# Patient Record
Sex: Female | Born: 1945 | Race: Asian | Hispanic: No | Marital: Married | Smoking: Former smoker
Health system: Southern US, Community
[De-identification: ages and names within clinical notes are randomized; demographics above are authoritative.]

## PROBLEM LIST (undated history)

## (undated) DIAGNOSIS — I429 Cardiomyopathy, unspecified: Secondary | ICD-10-CM

## (undated) DIAGNOSIS — I509 Heart failure, unspecified: Secondary | ICD-10-CM

## (undated) DIAGNOSIS — Z95820 Peripheral vascular angioplasty status with implants and grafts: Secondary | ICD-10-CM

## (undated) DIAGNOSIS — IMO0002 Reserved for concepts with insufficient information to code with codable children: Secondary | ICD-10-CM

## (undated) DIAGNOSIS — D649 Anemia, unspecified: Secondary | ICD-10-CM

## (undated) DIAGNOSIS — C801 Malignant (primary) neoplasm, unspecified: Secondary | ICD-10-CM

## (undated) DIAGNOSIS — Z972 Presence of dental prosthetic device (complete) (partial): Secondary | ICD-10-CM

## (undated) DIAGNOSIS — M329 Systemic lupus erythematosus, unspecified: Secondary | ICD-10-CM

## (undated) DIAGNOSIS — M1990 Unspecified osteoarthritis, unspecified site: Secondary | ICD-10-CM

## (undated) DIAGNOSIS — I739 Peripheral vascular disease, unspecified: Secondary | ICD-10-CM

## (undated) DIAGNOSIS — E785 Hyperlipidemia, unspecified: Secondary | ICD-10-CM

## (undated) DIAGNOSIS — J449 Chronic obstructive pulmonary disease, unspecified: Secondary | ICD-10-CM

## (undated) DIAGNOSIS — M3214 Glomerular disease in systemic lupus erythematosus: Secondary | ICD-10-CM

## (undated) DIAGNOSIS — I10 Essential (primary) hypertension: Secondary | ICD-10-CM

## (undated) DIAGNOSIS — K219 Gastro-esophageal reflux disease without esophagitis: Secondary | ICD-10-CM

## (undated) DIAGNOSIS — N189 Chronic kidney disease, unspecified: Secondary | ICD-10-CM

## (undated) DIAGNOSIS — I7300 Raynaud's syndrome without gangrene: Secondary | ICD-10-CM

## (undated) DIAGNOSIS — M5030 Other cervical disc degeneration, unspecified cervical region: Secondary | ICD-10-CM

## (undated) HISTORY — PX: EYE SURGERY: SHX253

## (undated) HISTORY — DX: Hyperlipidemia, unspecified: E78.5

## (undated) HISTORY — PX: ABDOMINAL HYSTERECTOMY: SHX81

## (undated) HISTORY — PX: CARDIAC SURGERY: SHX584

## (undated) HISTORY — PX: CARDIAC CATHETERIZATION: SHX172

## (undated) HISTORY — DX: Chronic kidney disease, unspecified: N18.9

## (undated) HISTORY — DX: Malignant (primary) neoplasm, unspecified: C80.1

---

## 2001-07-19 HISTORY — PX: CORONARY ARTERY BYPASS GRAFT: SHX141

## 2001-10-25 ENCOUNTER — Ambulatory Visit (HOSPITAL_COMMUNITY): Admission: RE | Admit: 2001-10-25 | Discharge: 2001-10-25 | Payer: Self-pay | Admitting: *Deleted

## 2005-07-13 ENCOUNTER — Ambulatory Visit: Payer: Self-pay | Admitting: Rheumatology

## 2010-11-13 ENCOUNTER — Ambulatory Visit: Payer: Self-pay | Admitting: Vascular Surgery

## 2011-01-15 ENCOUNTER — Ambulatory Visit: Payer: Self-pay | Admitting: Vascular Surgery

## 2011-01-27 ENCOUNTER — Inpatient Hospital Stay: Payer: Self-pay | Admitting: Vascular Surgery

## 2011-12-15 ENCOUNTER — Ambulatory Visit: Payer: Self-pay | Admitting: Vascular Surgery

## 2011-12-15 LAB — CREATININE, SERUM
Creatinine: 1.05 mg/dL (ref 0.60–1.30)
EGFR (African American): >60
EGFR (Non-African Amer.): 56 — ABNORMAL LOW

## 2012-01-04 ENCOUNTER — Ambulatory Visit: Payer: Self-pay | Admitting: Rheumatology

## 2012-04-06 ENCOUNTER — Ambulatory Visit: Payer: Self-pay | Admitting: Internal Medicine

## 2012-04-17 ENCOUNTER — Ambulatory Visit: Payer: Self-pay | Admitting: Internal Medicine

## 2013-03-06 DIAGNOSIS — I7300 Raynaud's syndrome without gangrene: Secondary | ICD-10-CM | POA: Insufficient documentation

## 2013-03-06 DIAGNOSIS — I2510 Atherosclerotic heart disease of native coronary artery without angina pectoris: Secondary | ICD-10-CM | POA: Insufficient documentation

## 2013-03-06 DIAGNOSIS — M329 Systemic lupus erythematosus, unspecified: Secondary | ICD-10-CM | POA: Diagnosis present

## 2013-03-06 DIAGNOSIS — K219 Gastro-esophageal reflux disease without esophagitis: Secondary | ICD-10-CM | POA: Diagnosis present

## 2013-03-07 DIAGNOSIS — N281 Cyst of kidney, acquired: Secondary | ICD-10-CM | POA: Insufficient documentation

## 2013-03-07 DIAGNOSIS — R809 Proteinuria, unspecified: Secondary | ICD-10-CM | POA: Insufficient documentation

## 2013-03-07 DIAGNOSIS — N184 Chronic kidney disease, stage 4 (severe): Secondary | ICD-10-CM | POA: Insufficient documentation

## 2013-03-07 DIAGNOSIS — N182 Chronic kidney disease, stage 2 (mild): Secondary | ICD-10-CM | POA: Insufficient documentation

## 2013-03-07 DIAGNOSIS — J309 Allergic rhinitis, unspecified: Secondary | ICD-10-CM | POA: Insufficient documentation

## 2013-04-25 DIAGNOSIS — M810 Age-related osteoporosis without current pathological fracture: Secondary | ICD-10-CM | POA: Insufficient documentation

## 2013-04-25 DIAGNOSIS — I739 Peripheral vascular disease, unspecified: Secondary | ICD-10-CM | POA: Insufficient documentation

## 2013-06-04 DIAGNOSIS — M3214 Glomerular disease in systemic lupus erythematosus: Secondary | ICD-10-CM | POA: Insufficient documentation

## 2013-07-09 DIAGNOSIS — I10 Essential (primary) hypertension: Secondary | ICD-10-CM | POA: Diagnosis present

## 2013-08-14 ENCOUNTER — Ambulatory Visit: Payer: Self-pay | Admitting: Internal Medicine

## 2014-01-28 DIAGNOSIS — M810 Age-related osteoporosis without current pathological fracture: Secondary | ICD-10-CM | POA: Insufficient documentation

## 2014-01-28 DIAGNOSIS — E785 Hyperlipidemia, unspecified: Secondary | ICD-10-CM | POA: Insufficient documentation

## 2014-02-28 ENCOUNTER — Ambulatory Visit (INDEPENDENT_AMBULATORY_CARE_PROVIDER_SITE_OTHER): Payer: Medicare Other | Admitting: Podiatrist

## 2014-02-28 VITALS — BP 161/93 | HR 61 | Resp 16 | Ht 59.0 in | Wt 120.0 lb

## 2014-02-28 DIAGNOSIS — L723 Sebaceous cyst: Secondary | ICD-10-CM

## 2014-02-28 DIAGNOSIS — M799 Soft tissue disorder, unspecified: Secondary | ICD-10-CM

## 2014-02-28 NOTE — Progress Notes (Signed)
Chief Complaint  Patient presents with  . Toe Pain    she wants dr Valentina Lucks to look at her big toe on her rt foot to see what it is. it hurts sometimes     HPI: Patient is 68 y.o. female who presents today for pain on the right great toe. 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 They're painful with pressure. No pus or purulence is expressed or has been noted by the patient or her husband.  Physical Exam  Patient is awake, alert, and oriented x 3.  In no acute distress.  Vascular status is intact with palpable pedal pulses at 2/4 DP and PT bilateral and capillary refill time within normal limits. Neurological sensation is also intact bilaterally via Semmes Weinstein monofilament at 5/5 sites. Light touch, vibratory sensation, Achilles tendon reflex is intact. Dermatological exam reveals well-circumscribed hard nodular and cystic lesions on each end of the original incision.  Musculature intact with dorsiflexion, plantarflexion, inversion, eversion.  Assessment: Cyst/sebaceous cysts right great toe  Plan: Anesthetized the digit with lidocaine and Marcaine mixture under sterile technique. The toe was then prepped with Betadine exsanguinated. The cysts were excised utilizing a #15 blade and curet. The texture of the tissue was soft and cottage cheese like. It was removed in total and sent for pathology. Antibiotic ointment and a dressing was applied. She will see Dr. Jacqualyn Posey in one week for followup. She was dispensed instructions for antibacterial soap cleansing and use of Neosporin and a bandage. If any problems, concerns, any redness, swelling, pus or pirulence arise she is instructed to call immediately.

## 2014-02-28 NOTE — Patient Instructions (Signed)

## 2014-03-07 ENCOUNTER — Ambulatory Visit (INDEPENDENT_AMBULATORY_CARE_PROVIDER_SITE_OTHER): Payer: Medicare Other | Admitting: Podiatry

## 2014-03-07 VITALS — BP 128/78 | HR 71 | Resp 16

## 2014-03-07 DIAGNOSIS — M799 Soft tissue disorder, unspecified: Secondary | ICD-10-CM

## 2014-03-07 DIAGNOSIS — L723 Sebaceous cyst: Secondary | ICD-10-CM

## 2014-03-07 NOTE — Patient Instructions (Signed)
Continue with antibiotic soaks. Keep covered with antibiotic ointment and a band-it.  Continue this until healed/no drainage.  Monitor for any signs/symptoms of infection. Call the office immediately if any occur or go directly to the emergency room. Call with any questions/concerns.

## 2014-03-07 NOTE — Progress Notes (Signed)
Patient ID: Judy Torres, female   DOB: 04-Nov-1945, 68 y.o.   MRN: [id number]  68 year old female presents today for followup of removal right hallux possible cysts. She says she is to continue with antibiotic soaks and covering with antibiotic ointment and a Band-Aid. She denies any systemic complaints such as fevers, chills, nausea, vomiting. She has any complaints this time.  Objective: AAO x3, NAD. DP/PT pulses palpable. CRT < 3 sec. protective sensation intact the Semmes Weinstein monofilament. Dermatological exam reveals the 2 areas of cyst excision are filling in with granular tissue and there is no surrounding erythema or other clinical signs of infection.   Assessment: 68 year old female status post cyst, sebaceous cyst right hallux removal  Plan: Continue with aortic soaks until healed and continue with antibiotic ointment and a Band-Aid until healed. If there is drainage can switch Epson salt soaks. Continue to monitor for any signs or symptoms of infection and directed to call or go directly to the emergency room. Followup in 2 weeks or sooner any palms are to arise.

## 2014-03-15 ENCOUNTER — Telehealth: Payer: Self-pay | Admitting: *Deleted

## 2014-03-15 ENCOUNTER — Encounter: Payer: Self-pay | Admitting: Podiatrist

## 2014-03-15 NOTE — Telephone Encounter (Signed)
Called and left message letting pt know specimen was a cyst. If any questions please call back.

## 2014-03-21 ENCOUNTER — Ambulatory Visit (INDEPENDENT_AMBULATORY_CARE_PROVIDER_SITE_OTHER): Payer: Medicare Other | Admitting: Podiatry

## 2014-03-21 VITALS — BP 132/84 | HR 67 | Resp 16

## 2014-03-21 DIAGNOSIS — M79609 Pain in unspecified limb: Secondary | ICD-10-CM

## 2014-03-21 DIAGNOSIS — M799 Soft tissue disorder, unspecified: Secondary | ICD-10-CM

## 2014-03-21 DIAGNOSIS — M79676 Pain in unspecified toe(s): Secondary | ICD-10-CM

## 2014-03-21 DIAGNOSIS — L84 Corns and callosities: Secondary | ICD-10-CM

## 2014-03-21 NOTE — Patient Instructions (Signed)
Continue with Epsom salts soaks until healed. Cover with antibiotic ointment and a Band-Aid. Monitor for any signs/symptoms of infection. Call the office immediately if any occur or go directly to the emergency room. Call with any questions/concerns.

## 2014-03-21 NOTE — Progress Notes (Signed)
Patient ID: Judy Torres, female   DOB: [birth year], 68 y.o.   MRN: [id number]  Subjective: 68 year old female returns the office today for followup evaluation of right hallux status post inclusion cyst removal. States she continues with Epson salt soaks followed by antibiotic ointment and a dressing daily. Denies any systemic complaints such as fevers, chills, nausea, vomiting. Patient has secondary complaints of painful calluses on the distal aspect of the third and fifth digits. Pain increases with pressure in shoe gear. Patient states that she will likely be having since placed in her right leg to help improve blood flow and [facility] soon. No other complaints at this time.  Objective: AAO x3, NAD DP/PT pulses palpable. CRT < 3sec.  Right hallux cyst removal sites healing without complications. Lateral site has completely healed and medial with superficial granular wound. Small amount of hyperkeratotic tissue overlying the periwound. No surrounding erythema, drainage/purulence, fluctuance, crepitus, ascending cellulitis.  Hyperkeratotic lesion distal aspect third digit and lateral fifth digit right foot.  Hammertoe deformities of lesser digits, adductovarus the fifth digit. MMT 5/5, ROM WNL  Assessment:  68 year old female status post right hallux inclusion cyst removal healing without complications, symptomatic hyperkeratotic lesions third and fifth digits.  Plan: -Discussed various treatment options the patient in detail including alternatives, risks, complications. -Hyperkeratotic lesion on hallux medial wound sharply debrided without complications. [place] wound is granular, superficial, and without signs of infection. Continue with  soaks daily followed by antibiotic ointment and a Band-Aid. -Hyperkeratotic lesions third and fifth digits sharply debrided without complications to patient comfort. -Followup in 2 weeks for recheck of the right hallux wound or sooner if any problems are to  arise. Monitor for any clinical signs or symptoms of infection and directed to call the office immediately if any are to occur or go directly to the emergency room. Call the questions or concerns or any change in symptoms.

## 2014-04-04 ENCOUNTER — Ambulatory Visit: Payer: Medicare Other | Admitting: Podiatry

## 2014-04-15 ENCOUNTER — Ambulatory Visit: Payer: Self-pay | Admitting: Internal Medicine

## 2014-08-15 DIAGNOSIS — I714 Abdominal aortic aneurysm, without rupture, unspecified: Secondary | ICD-10-CM | POA: Insufficient documentation

## 2015-08-18 ENCOUNTER — Other Ambulatory Visit: Payer: Self-pay | Admitting: Internal Medicine

## 2015-08-18 DIAGNOSIS — I714 Abdominal aortic aneurysm, without rupture, unspecified: Secondary | ICD-10-CM

## 2015-08-22 ENCOUNTER — Ambulatory Visit
Admission: RE | Admit: 2015-08-22 | Discharge: 2015-08-22 | Disposition: A | Discharge status: Home / Self Care | Payer: Medicare Other | Source: Ambulatory Visit | Attending: Internal Medicine | Admitting: Internal Medicine

## 2015-08-22 DIAGNOSIS — I723 Aneurysm of iliac artery: Secondary | ICD-10-CM | POA: Diagnosis not present

## 2015-08-22 DIAGNOSIS — I714 Abdominal aortic aneurysm, without rupture, unspecified: Secondary | ICD-10-CM

## 2015-08-22 DIAGNOSIS — Z9889 Other specified postprocedural states: Secondary | ICD-10-CM | POA: Diagnosis not present

## 2017-09-19 DIAGNOSIS — R0602 Shortness of breath: Secondary | ICD-10-CM | POA: Insufficient documentation

## 2017-12-28 ENCOUNTER — Encounter: Payer: Self-pay | Admitting: Emergency Medicine

## 2017-12-28 ENCOUNTER — Other Ambulatory Visit: Payer: Self-pay

## 2017-12-28 ENCOUNTER — Inpatient Hospital Stay
Admission: EM | Admit: 2017-12-28 | Discharge: 2018-01-02 | DRG: 378 | Disposition: A | Discharge status: Home / Self Care | Payer: Medicare Other | Attending: Internal Medicine | Admitting: Internal Medicine

## 2017-12-28 DIAGNOSIS — Z7952 Long term (current) use of systemic steroids: Secondary | ICD-10-CM

## 2017-12-28 DIAGNOSIS — K621 Rectal polyp: Secondary | ICD-10-CM | POA: Diagnosis not present

## 2017-12-28 DIAGNOSIS — K5730 Diverticulosis of large intestine without perforation or abscess without bleeding: Secondary | ICD-10-CM | POA: Diagnosis present

## 2017-12-28 DIAGNOSIS — M329 Systemic lupus erythematosus, unspecified: Secondary | ICD-10-CM | POA: Diagnosis present

## 2017-12-28 DIAGNOSIS — K625 Hemorrhage of anus and rectum: Secondary | ICD-10-CM | POA: Diagnosis not present

## 2017-12-28 DIAGNOSIS — Z7982 Long term (current) use of aspirin: Secondary | ICD-10-CM

## 2017-12-28 DIAGNOSIS — Z882 Allergy status to sulfonamides status: Secondary | ICD-10-CM | POA: Diagnosis not present

## 2017-12-28 DIAGNOSIS — D62 Acute posthemorrhagic anemia: Secondary | ICD-10-CM | POA: Diagnosis not present

## 2017-12-28 DIAGNOSIS — K219 Gastro-esophageal reflux disease without esophagitis: Secondary | ICD-10-CM | POA: Diagnosis present

## 2017-12-28 DIAGNOSIS — K648 Other hemorrhoids: Secondary | ICD-10-CM | POA: Diagnosis present

## 2017-12-28 DIAGNOSIS — D123 Benign neoplasm of transverse colon: Secondary | ICD-10-CM | POA: Diagnosis not present

## 2017-12-28 DIAGNOSIS — Z87891 Personal history of nicotine dependence: Secondary | ICD-10-CM

## 2017-12-28 DIAGNOSIS — Z880 Allergy status to penicillin: Secondary | ICD-10-CM | POA: Diagnosis not present

## 2017-12-28 DIAGNOSIS — K921 Melena: Principal | ICD-10-CM | POA: Diagnosis present

## 2017-12-28 DIAGNOSIS — Z7983 Long term (current) use of bisphosphonates: Secondary | ICD-10-CM

## 2017-12-28 DIAGNOSIS — R509 Fever, unspecified: Secondary | ICD-10-CM

## 2017-12-28 DIAGNOSIS — I10 Essential (primary) hypertension: Secondary | ICD-10-CM | POA: Diagnosis present

## 2017-12-28 DIAGNOSIS — Z8249 Family history of ischemic heart disease and other diseases of the circulatory system: Secondary | ICD-10-CM

## 2017-12-28 DIAGNOSIS — D124 Benign neoplasm of descending colon: Secondary | ICD-10-CM | POA: Diagnosis not present

## 2017-12-28 DIAGNOSIS — K922 Gastrointestinal hemorrhage, unspecified: Secondary | ICD-10-CM | POA: Diagnosis present

## 2017-12-28 DIAGNOSIS — D125 Benign neoplasm of sigmoid colon: Secondary | ICD-10-CM | POA: Diagnosis not present

## 2017-12-28 DIAGNOSIS — D122 Benign neoplasm of ascending colon: Secondary | ICD-10-CM | POA: Diagnosis not present

## 2017-12-28 DIAGNOSIS — D127 Benign neoplasm of rectosigmoid junction: Secondary | ICD-10-CM | POA: Diagnosis not present

## 2017-12-28 HISTORY — DX: Gastro-esophageal reflux disease without esophagitis: K21.9

## 2017-12-28 HISTORY — DX: Essential (primary) hypertension: I10

## 2017-12-28 HISTORY — DX: Systemic lupus erythematosus, unspecified: M32.9

## 2017-12-28 HISTORY — DX: Reserved for concepts with insufficient information to code with codable children: IMO0002

## 2017-12-28 LAB — CBC
HEMATOCRIT: 24.7 % — AB (ref 35.0–47.0)
Hemoglobin: 8.3 g/dL — ABNORMAL LOW (ref 12.0–16.0)
MCH: 32.1 pg (ref 26.0–34.0)
MCHC: 33.7 g/dL (ref 32.0–36.0)
MCV: 95.1 fL (ref 80.0–100.0)
PLATELETS: 267 10*3/uL (ref 150–440)
RBC: 2.59 MIL/uL — ABNORMAL LOW (ref 3.80–5.20)
RDW: 13.2 % (ref 11.5–14.5)
WBC: 9.2 10*3/uL (ref 3.6–11.0)

## 2017-12-28 LAB — COMPREHENSIVE METABOLIC PANEL
ALBUMIN: 3.2 g/dL — AB (ref 3.5–5.0)
ALT: 12 U/L — AB (ref 14–54)
AST: 28 U/L (ref 15–41)
Alkaline Phosphatase: 37 U/L — ABNORMAL LOW (ref 38–126)
Anion gap: 10 (ref 5–15)
BILIRUBIN TOTAL: 0.4 mg/dL (ref 0.3–1.2)
BUN: 46 mg/dL — AB (ref 6–20)
CHLORIDE: 104 mmol/L (ref 101–111)
CO2: 23 mmol/L (ref 22–32)
CREATININE: 1.1 mg/dL — AB (ref 0.44–1.00)
Calcium: 8 mg/dL — ABNORMAL LOW (ref 8.9–10.3)
GFR calc Af Amer: 58 mL/min — ABNORMAL LOW (ref >60)
GFR, EST NON AFRICAN AMERICAN: 50 mL/min — AB (ref >60)
GLUCOSE: 168 mg/dL — AB (ref 65–99)
Potassium: 3.8 mmol/L (ref 3.5–5.1)
Sodium: 137 mmol/L (ref 135–145)
Total Protein: 5.5 g/dL — ABNORMAL LOW (ref 6.5–8.1)

## 2017-12-28 MED ORDER — SODIUM CHLORIDE 0.9 % IV SOLN
80.0000 mg | Freq: Once | INTRAVENOUS | Status: AC
  Administered 2017-12-29: 80 mg via INTRAVENOUS
  Filled 2017-12-28: qty 80

## 2017-12-28 MED ORDER — SODIUM CHLORIDE 0.9 % IV BOLUS
1000.0000 mL | Freq: Once | INTRAVENOUS | Status: AC
  Administered 2017-12-28: 1000 mL via INTRAVENOUS

## 2017-12-28 MED ORDER — PANTOPRAZOLE SODIUM 40 MG IV SOLR: 40.0000 mg | Freq: Two times a day (BID) | INTRAVENOUS | Status: DC

## 2017-12-28 MED ORDER — SODIUM CHLORIDE 0.9 % IV SOLN
8.0000 mg/h | INTRAVENOUS | Status: DC
  Administered 2017-12-29 – 2017-12-30 (×4): 8 mg/h via INTRAVENOUS
  Filled 2017-12-28 (×4): qty 80

## 2017-12-28 NOTE — H&P (Signed)
Judy Torres at [facility] NAME: [name]    MR#:  [id number]  [place]:  19-Jul-1946  DATE OF ADMISSION:  12/28/2017  PRIMARY CARE PHYSICIAN: [name], MD   REQUESTING/REFERRING PHYSICIAN: Cherylann Banas, MD  CHIEF COMPLAINT:   Chief Complaint  Patient presents with  . Rectal Bleeding    HISTORY OF PRESENT ILLNESS:  [name]  is a 72 y.o. female who presents with several episodes of moderate volume rectal bleeding over the last 24 hours.  Patient is guaiac positive here with bloody bowel movement witnessed in the ED.  Her hemoglobin has fallen significantly since last value in our system which was back in February.  Hospitalist were called for admission and further evaluation  PAST MEDICAL HISTORY:   Past Medical History:  Diagnosis Date  . GERD (gastroesophageal reflux disease)   . HTN (hypertension)   . Lupus ([place])      PAST SURGICAL HISTORY:   Past Surgical History:  Procedure Laterality Date  . CARDIAC SURGERY    . CESAREAN SECTION       SOCIAL HISTORY:   Social History   Tobacco Use  . Smoking status: Former Research scientist (life sciences)  . Smokeless tobacco: Never Used  Substance Use Topics  . Alcohol use: Not Currently     FAMILY HISTORY:   Family History  Problem Relation Age of Onset  . Hypertension Mother   . Stroke Mother   . Hypertension Sister      DRUG ALLERGIES:   Allergies  Allergen Reactions  . Penicillins Other (See Comments)    Pass out  . Sulfa Antibiotics Nausea Only    MEDICATIONS AT HOME:   Prior to Admission medications   Medication Sig Start Date End Date Taking? Authorizing Provider  alendronate (FOSAMAX) 70 MG tablet Take by mouth.   Yes [provider]  aspirin EC 81 MG tablet Take 81 mg by mouth. 05/21/13  Yes [provider]  ezetimibe-simvastatin (VYTORIN) 10-40 MG per tablet Take 1 tablet by mouth daily.   Yes [provider]   hydroxychloroquine (PLAQUENIL) 200 MG tablet TAKE 1 TABLET BY MOUTH TWICE A DAY 01/22/14  Yes [provider]  lisinopril (PRINIVIL,ZESTRIL) 10 MG tablet Take 10 mg by mouth daily.   Yes [provider]  predniSONE (DELTASONE) 5 MG tablet Take 5 mg by mouth. 12/12/13  Yes [provider]    REVIEW OF SYSTEMS:  Review of Systems  Constitutional: Negative for chills, fever, malaise/fatigue and weight loss.  HENT: Negative for ear pain, hearing loss and tinnitus.   Eyes: Negative for blurred vision, double vision, pain and redness.  Respiratory: Negative for cough, hemoptysis and shortness of breath.   Cardiovascular: Negative for chest pain, palpitations, orthopnea and leg swelling.  Gastrointestinal: Positive for abdominal pain and blood in stool. Negative for constipation, diarrhea, nausea and vomiting.  Genitourinary: Negative for dysuria, frequency and hematuria.  Musculoskeletal: Negative for back pain, joint pain and neck pain.  Skin:       No acne, rash, or lesions  Neurological: Negative for dizziness, tremors, focal weakness and weakness.  Endo/Heme/Allergies: Negative for polydipsia. Does not bruise/bleed easily.  Psychiatric/Behavioral: Negative for depression. The patient is not nervous/anxious and does not have insomnia.      VITAL SIGNS:   Vitals:   12/28/17 1920 12/28/17 1922 12/28/17 2200 12/28/17 2332  BP: 123/73  121/78   Pulse: (!) 113  74 73  Resp:  20     Temp: (!) 97.5 F (36.4 C)     TempSrc: Oral     SpO2: 100%  100% 100%  Weight:  50.8 kg (112 lb)    Height:  4\' 10"  (1.473 m)     Wt Readings from Last 3 Encounters:  12/28/17 50.8 kg (112 lb)  02/28/14 54.4 kg (120 lb)    PHYSICAL EXAMINATION:  Physical Exam  Vitals reviewed. Constitutional: She is oriented to person, place, and time. She appears well-developed and well-nourished. No distress.  HENT:  Head: Normocephalic and atraumatic.  Mouth/Throat: Oropharynx is clear  and moist.  Eyes: Pupils are equal, round, and reactive to light. Conjunctivae and EOM are normal. No scleral icterus.  Pale conjunctiva  Neck: Normal range of motion. Neck supple. No JVD present. No thyromegaly present.  Cardiovascular: Normal rate, regular rhythm and intact distal pulses. Exam reveals no gallop and no friction rub.  No murmur heard. Respiratory: Effort normal and breath sounds normal. No respiratory distress. She has no wheezes. She has no rales.  GI: Soft. Bowel sounds are normal. She exhibits no distension. There is no tenderness.  Musculoskeletal: Normal range of motion. She exhibits no edema.  No arthritis, no gout  Lymphadenopathy:    She has no cervical adenopathy.  Neurological: She is alert and oriented to person, place, and time. No cranial nerve deficit.  No dysarthria, no aphasia  Skin: Skin is warm and dry. No rash noted. No erythema.  Psychiatric: She has a normal mood and affect. Her behavior is normal. Judgment and thought content normal.    LABORATORY PANEL:   CBC Recent Labs  Lab 12/28/17 1928  WBC 9.2  HGB 8.3*  HCT 24.7*  PLT 267   ------------------------------------------------------------------------------------------------------------------  Chemistries  Recent Labs  Lab 12/28/17 1928  NA 137  K 3.8  CL 104  CO2 23  GLUCOSE 168*  BUN 46*  CREATININE 1.10*  CALCIUM 8.0*  AST 28  ALT 12*  ALKPHOS 37*  BILITOT 0.4   ------------------------------------------------------------------------------------------------------------------  Cardiac Enzymes No results for input(s): TROPONINI in the last 168 hours. ------------------------------------------------------------------------------------------------------------------  RADIOLOGY:  No results found.  EKG:  No orders found for this or any previous visit.  IMPRESSION AND PLAN:  Principal Problem:   GI bleed -PPI drip, monitor hemoglobin, GI consult Active Problems:    Lupus ([place]) -continue home medications   HTN (hypertension) -home dose antihypertensive   GERD (gastroesophageal reflux disease) -PPI as above  Chart review performed and case discussed with ED provider. Labs, imaging and/or ECG reviewed by provider and discussed with patient/family. Management plans discussed with the patient and/or family.  DVT PROPHYLAXIS: Mechanical only  GI PROPHYLAXIS: PPI  ADMISSION STATUS: Inpatient  CODE STATUS: Full  TOTAL TIME TAKING CARE OF THIS PATIENT: 45 minutes.   Jannifer Franklin, Mason Burleigh Rutledge 12/28/2017, 11:51 PM  [facility]  Office  [phone]  CC: Primary care physician; [name], MD  Note:  This document was prepared using Dragon voice recognition software and may include unintentional dictation errors.

## 2017-12-28 NOTE — ED Notes (Signed)
ED Provider at bedside. 

## 2017-12-28 NOTE — ED Provider Notes (Signed)
[facility] Emergency Department Provider Note ____________________________________________   First MD Initiated Contact with Patient 12/28/17 2146     (approximate)  I have reviewed the triage vital signs and the nursing notes.   HISTORY  Chief Complaint Rectal Bleeding    HPI [name] is a 72 y.o. female with PMH of lupus who presents with bloody stool, acute onset today, described as dark red blood with some red coloration to the water in the toilet bowl, and associated with some generalized weakness over the last few days.  Patient reports some abdominal cramping but no constant abdominal pain.  She denies nausea or vomiting.  She denies any prior history of GI bleeding.  The patient states that she had just been on a steroid course due to some shoulder pain that was thought to be arthritic.  Past Medical History:  Diagnosis Date  . Lupus ([place])     There are no active problems to display for this patient.   Past Surgical History:  Procedure Laterality Date  . CARDIAC SURGERY    . CESAREAN SECTION      Prior to Admission medications   Medication Sig Start Date End Date Taking? Authorizing Provider  alendronate (FOSAMAX) 70 MG tablet Take by mouth.   Yes [provider]  aspirin EC 81 MG tablet Take 81 mg by mouth. 05/21/13  Yes [provider]  ezetimibe-simvastatin (VYTORIN) 10-40 MG per tablet Take 1 tablet by mouth daily.   Yes [provider]  hydroxychloroquine (PLAQUENIL) 200 MG tablet TAKE 1 TABLET BY MOUTH TWICE A DAY 01/22/14  Yes [provider]  lisinopril (PRINIVIL,ZESTRIL) 10 MG tablet Take 10 mg by mouth daily.   Yes [provider]  predniSONE (DELTASONE) 5 MG tablet Take 5 mg by mouth. 12/12/13  Yes [provider]    Allergies Penicillins and Sulfa antibiotics  No family history on file.  Social History Social History   Tobacco Use  . Smoking status: Former Research scientist (life sciences)   . Smokeless tobacco: Never Used  Substance Use Topics  . Alcohol use: Not on file  . Drug use: Not on file    Review of Systems  Constitutional: No fever. Eyes: No redness. ENT: No sore throat. Cardiovascular: Denies chest pain. Respiratory: Denies shortness of breath. Gastrointestinal: No vomiting.  Genitourinary: Negative for hematuria.  Musculoskeletal: Negative for back pain. Skin: Negative for rash. Neurological: Negative for headache.   ____________________________________________   PHYSICAL EXAM:  VITAL SIGNS: ED Triage Vitals  Enc Vitals Group     BP 12/28/17 1920 123/73     Pulse Rate 12/28/17 1920 (!) 113     Resp 12/28/17 1920 20     Temp 12/28/17 1920 (!) 97.5 F (36.4 C)     Temp Source 12/28/17 1920 Oral     SpO2 12/28/17 1920 100 %     Weight 12/28/17 1922 112 lb (50.8 kg)     Height 12/28/17 1922 4\' 10"  (1.473 m)     Head Circumference --      Peak Flow --      Pain Score 12/28/17 1922 5     Pain Loc --      Pain Edu? --      Excl. in [place]? --     Constitutional: Alert and oriented. Well appearing and in no acute distress. Eyes: Conjunctivae are slightly pale.  Head: Atraumatic. Nose: No congestion/rhinnorhea. Mouth/Throat: Mucous membranes are moist.   Neck: Normal range of motion.  Cardiovascular: Normal rate, regular rhythm. Grossly normal heart sounds.  Good peripheral circulation. Respiratory: Normal respiratory effort.  No retractions. Lungs CTAB. Gastrointestinal: Soft and nontender. No distention.  No external hemorrhoid on DRE.  No active bleeding.  Guaiac positive brown/dark red stool. Genitourinary: No CVA tenderness. Musculoskeletal: No lower extremity edema.  Extremities warm and well perfused.  Neurologic:  Normal speech and language. No gross focal neurologic deficits are appreciated.  Skin:  Skin is warm and dry. No rash noted. Psychiatric: Mood and affect are normal. Speech and behavior are  normal.  ____________________________________________   LABS (all labs ordered are listed, but only abnormal results are displayed)  Labs Reviewed  COMPREHENSIVE METABOLIC PANEL - Abnormal; Notable for the following components:      Result Value   Glucose, Bld 168 (*)    BUN 46 (*)    Creatinine, Ser 1.10 (*)    Calcium 8.0 (*)    Total Protein 5.5 (*)    Albumin 3.2 (*)    ALT 12 (*)    Alkaline Phosphatase 37 (*)    GFR calc non Af Amer 50 (*)    GFR calc Af Amer 58 (*)    All other components within normal limits  CBC - Abnormal; Notable for the following components:   RBC 2.59 (*)    Hemoglobin 8.3 (*)    HCT 24.7 (*)    All other components within normal limits  TYPE AND SCREEN   ____________________________________________  EKG   ____________________________________________  RADIOLOGY    ____________________________________________   PROCEDURES  Procedure(s) performed: No  Procedures  Critical Care performed: No ____________________________________________   INITIAL IMPRESSION / ASSESSMENT AND PLAN / ED COURSE  Pertinent labs & imaging results that were available during my care of the patient were reviewed by me and considered in my medical decision making (see chart for details).  72 year old female with PMH as noted above presents with 1 day of dark red blood in her stool, associated with some abdominal cramping and generalized weakness/fatigue.  No prior history of this symptom.  On exam, the patient is relatively comfortable appearing.  Her heart rate was initially elevated although this improved in the ED.  Other vital signs are normal.  The abdomen is soft and nontender.  DRE revealed a dark brown/dark red stool which was guaiac positive.  Overall presentation is most consistent with lower gastrointestinal bleed, such as diverticulosis.  The patient has no signs of acute diverticulitis.  I reviewed past medical records, and the patient had a  CBC in February of this year showing hemoglobin of 12.  Therefore, her hemoglobin of 8 today is an acute drop.  There is no indication for transfusion emergently, however the patient will need inpatient monitoring, serial hemoglobins, and likely GI consult.  I will give fluids at this time, and plan for admission.  ----------------------------------------- 11:37 PM on 12/28/2017 -----------------------------------------  I signed the patient out to the hospitalist Dr. Jannifer Franklin. ____________________________________________   FINAL CLINICAL IMPRESSION(S) / ED DIAGNOSES  Final diagnoses:  Gastrointestinal hemorrhage, unspecified gastrointestinal hemorrhage type      NEW MEDICATIONS STARTED DURING THIS VISIT:  New Prescriptions   No medications on file     Note:  This document was prepared using Dragon voice recognition software and may include unintentional dictation errors.    Arta Silence, MD 12/28/17 2337

## 2017-12-28 NOTE — ED Triage Notes (Addendum)
Pt to triage via w/c with no distress noted; pt reports dark blood noted in stool today with lower abd pain & nausea; denies hx of same; recently rx prednisone taper pack (Monday) for arm pain

## 2017-12-28 NOTE — ED Notes (Signed)
Pt ambulated to in rm BR with stand by assist. Pt had 1 BM, some red tinged water noted around the BM however did not change the water color completely. Pt ambulated back to bed and is resting at this time. Call bell at bedside.

## 2017-12-28 NOTE — ED Notes (Signed)
Pt's husband Marcello Moores: [phone]

## 2017-12-29 ENCOUNTER — Other Ambulatory Visit: Payer: Self-pay

## 2017-12-29 ENCOUNTER — Inpatient Hospital Stay: Payer: Medicare Other

## 2017-12-29 DIAGNOSIS — K921 Melena: Principal | ICD-10-CM

## 2017-12-29 DIAGNOSIS — K922 Gastrointestinal hemorrhage, unspecified: Secondary | ICD-10-CM

## 2017-12-29 LAB — BASIC METABOLIC PANEL
Anion gap: 5 (ref 5–15)
BUN: 36 mg/dL — AB (ref 6–20)
CHLORIDE: 112 mmol/L — AB (ref 101–111)
CO2: 22 mmol/L (ref 22–32)
CREATININE: 1.09 mg/dL — AB (ref 0.44–1.00)
Calcium: 7.6 mg/dL — ABNORMAL LOW (ref 8.9–10.3)
GFR calc Af Amer: 59 mL/min — ABNORMAL LOW (ref >60)
GFR calc non Af Amer: 51 mL/min — ABNORMAL LOW (ref >60)
Glucose, Bld: 94 mg/dL (ref 65–99)
Potassium: 3.5 mmol/L (ref 3.5–5.1)
Sodium: 139 mmol/L (ref 135–145)

## 2017-12-29 LAB — CBC
HEMATOCRIT: 20.8 % — AB (ref 35.0–47.0)
Hemoglobin: 7 g/dL — ABNORMAL LOW (ref 12.0–16.0)
MCH: 32.1 pg (ref 26.0–34.0)
MCHC: 33.6 g/dL (ref 32.0–36.0)
MCV: 95.4 fL (ref 80.0–100.0)
PLATELETS: 234 10*3/uL (ref 150–440)
RBC: 2.18 MIL/uL — AB (ref 3.80–5.20)
RDW: 13.4 % (ref 11.5–14.5)
WBC: 12.1 10*3/uL — AB (ref 3.6–11.0)

## 2017-12-29 LAB — IRON AND TIBC
Iron: 55 ug/dL (ref 28–170)
SATURATION RATIOS: 19 % (ref 10.4–31.8)
TIBC: 285 ug/dL (ref 250–450)
UIBC: 231 ug/dL

## 2017-12-29 LAB — FOLATE: FOLATE: 6.3 ng/mL (ref >5.9)

## 2017-12-29 LAB — HEMOGLOBIN
HEMOGLOBIN: 7.2 g/dL — AB (ref 12.0–16.0)
HEMOGLOBIN: 6.9 g/dL — AB (ref 12.0–16.0)

## 2017-12-29 LAB — FERRITIN: Ferritin: 30 ng/mL (ref 11–307)

## 2017-12-29 LAB — PREPARE RBC (CROSSMATCH)

## 2017-12-29 LAB — VITAMIN B12: VITAMIN B 12: 254 pg/mL (ref 180–914)

## 2017-12-29 LAB — ABO/RH: ABO/RH(D): AB POS

## 2017-12-29 LAB — MRSA PCR SCREENING: MRSA by PCR: NEGATIVE

## 2017-12-29 LAB — GLUCOSE, CAPILLARY: Glucose-Capillary: 74 mg/dL (ref 65–99)

## 2017-12-29 MED ORDER — DIPHENHYDRAMINE HCL 50 MG/ML IJ SOLN
25.0000 mg | Freq: Once | INTRAMUSCULAR | Status: AC
  Administered 2017-12-29: 25 mg via INTRAVENOUS
  Filled 2017-12-29: qty 1

## 2017-12-29 MED ORDER — ONDANSETRON HCL 4 MG/2ML IJ SOLN: 4.0000 mg | Freq: Four times a day (QID) | INTRAMUSCULAR | Status: DC | PRN

## 2017-12-29 MED ORDER — HYDROXYCHLOROQUINE SULFATE 200 MG PO TABS
200.0000 mg | ORAL_TABLET | Freq: Two times a day (BID) | ORAL | Status: DC
  Administered 2017-12-29 – 2018-01-02 (×8): 200 mg via ORAL
  Filled 2017-12-29 (×12): qty 1

## 2017-12-29 MED ORDER — SODIUM CHLORIDE 0.9 % IV SOLN
Freq: Once | INTRAVENOUS | Status: AC
  Administered 2017-12-29: 21:00:00 via INTRAVENOUS

## 2017-12-29 MED ORDER — DEXTROSE-NACL 5-0.45 % IV SOLN
INTRAVENOUS | Status: DC
  Administered 2017-12-29: 13:00:00 via INTRAVENOUS

## 2017-12-29 MED ORDER — FUROSEMIDE 10 MG/ML IJ SOLN
20.0000 mg | Freq: Once | INTRAMUSCULAR | Status: AC
  Administered 2017-12-29: 20 mg via INTRAVENOUS
  Filled 2017-12-29: qty 4

## 2017-12-29 MED ORDER — ACETAMINOPHEN 650 MG RE SUPP: 650.0000 mg | Freq: Four times a day (QID) | RECTAL | Status: DC | PRN

## 2017-12-29 MED ORDER — SODIUM CHLORIDE 0.9 % IV SOLN: Freq: Once | INTRAVENOUS | Status: DC

## 2017-12-29 MED ORDER — PEG 3350-KCL-NA BICARB-NACL 420 G PO SOLR
4000.0000 mL | Freq: Once | ORAL | Status: AC
  Administered 2017-12-29: 4000 mL via ORAL
  Filled 2017-12-29: qty 4000

## 2017-12-29 MED ORDER — ASPIRIN EC 81 MG PO TBEC: 81.0000 mg | DELAYED_RELEASE_TABLET | Freq: Every day | ORAL | Status: DC

## 2017-12-29 MED ORDER — ACETAMINOPHEN 325 MG PO TABS
650.0000 mg | ORAL_TABLET | Freq: Once | ORAL | Status: AC
  Administered 2017-12-29: 650 mg via ORAL
  Filled 2017-12-29: qty 2

## 2017-12-29 MED ORDER — ACETAMINOPHEN 325 MG PO TABS
650.0000 mg | ORAL_TABLET | Freq: Four times a day (QID) | ORAL | Status: DC | PRN
  Administered 2017-12-30 – 2018-01-01 (×4): 650 mg via ORAL
  Filled 2017-12-29 (×5): qty 2

## 2017-12-29 MED ORDER — ONDANSETRON HCL 4 MG PO TABS: 4.0000 mg | ORAL_TABLET | Freq: Four times a day (QID) | ORAL | Status: DC | PRN

## 2017-12-29 MED ORDER — MORPHINE SULFATE (PF) 2 MG/ML IV SOLN
1.0000 mg | INTRAVENOUS | Status: DC | PRN
  Administered 2017-12-29 – 2018-01-02 (×3): 1 mg via INTRAVENOUS
  Filled 2017-12-29 (×3): qty 1

## 2017-12-29 MED ORDER — TECHNETIUM TC 99M-LABELED RED BLOOD CELLS IV KIT
20.0000 | PACK | Freq: Once | INTRAVENOUS | Status: AC | PRN
  Administered 2017-12-29: 21.959 via INTRAVENOUS

## 2017-12-29 NOTE — Consult Note (Signed)
Cephas Darby, MD [address]  [place]  [place], [place] [address]  Main: [phone]  Fax: [phone] Pager: [phone]   Consultation  Referring Provider:     No ref. provider found Primary Care Physician:  [name], MD Primary Gastroenterologist:  Dr. Sherri Sear         Reason for Consultation:     hematochezia  Date of Admission:  12/28/2017 Date of Consultation:  12/29/2017         HPI:   Judy Torres is a 72 y.o. Asian female with one-day history of several episodes of dark red blood per rectum associated with clots, lower abdominal cramps,and lethargy, severe weakness. Her hemoglobin Judy the ER was 8.3, MCV 95, Previous hemoglobin was 12.5 Judy 08/2017. She was started on pantoprazole drip, admitted to the floor, her hemoglobin further dropped to 7 today. BUN/creatinine ratio elevated.  Her husband is bedside when I saw the patient. Patient is receiving blood transfusion and she was very restless Judy her bed and wanted to have a BM. She had 1 episode of burgundy colored bowel movement followed by another large episode lying Judy the bed which was maroon with blood clots. Her blood pressure is borderline low. According to her husband, patient had broth, ice cream for lunch today Patient's husband reports that she did not have similar episode Judy the past. She was otherwise very active She recently finished a prednisone taper for arm pain. She is not on PPI as outpatient. She is on hydroxychloroquine for history of lupus  NSAIDs: None  Antiplts/Anticoagulants/Anti thrombotics: aspirin 81  GI Procedures: none  Past Medical History:  Diagnosis Date  . GERD (gastroesophageal reflux disease)   . HTN (hypertension)   . Lupus [facility])     Past Surgical History:  Procedure Laterality Date  . CARDIAC SURGERY    . CESAREAN SECTION      Prior to Admission medications   Medication Sig Start Date End Date Taking? Authorizing Provider  alendronate (FOSAMAX)  70 MG tablet Take by mouth.   Yes [provider]  aspirin EC 81 MG tablet Take 81 mg by mouth. 05/21/13  Yes [provider]  ezetimibe-simvastatin (VYTORIN) 10-40 MG per tablet Take 1 tablet by mouth daily.   Yes [provider]  hydroxychloroquine (PLAQUENIL) 200 MG tablet TAKE 1 TABLET BY MOUTH TWICE A DAY 01/22/14  Yes [provider]  lisinopril (PRINIVIL,ZESTRIL) 10 MG tablet Take 10 mg by mouth daily.   Yes [provider]  predniSONE (DELTASONE) 5 MG tablet Take 5 mg by mouth. 12/12/13  Yes [provider]    Current Facility-Administered Medications:  .  acetaminophen (TYLENOL) tablet 650 mg, 650 mg, Oral, Q6H PRN **OR** acetaminophen (TYLENOL) suppository 650 mg, 650 mg, Rectal, Q6H PRN, Lance Coon, MD .  dextrose 5 %-0.45 % sodium chloride infusion, , Intravenous, Continuous, Salary, Montell D, MD, Last Rate: 100 mL/hr at 12/29/17 1236 .  hydroxychloroquine (PLAQUENIL) tablet 200 mg, 200 mg, Oral, BID, Lance Coon, MD, 200 mg at 12/29/17 1147 .  ondansetron (ZOFRAN) tablet 4 mg, 4 mg, Oral, Q6H PRN **OR** ondansetron (ZOFRAN) injection 4 mg, 4 mg, Intravenous, Q6H PRN, Lance Coon, MD .  pantoprazole (PROTONIX) 80 mg Judy sodium chloride 0.9 % 250 mL (0.32 mg/mL) infusion, 8 mg/hr, Intravenous, Continuous, Lance Coon, MD, Last Rate: 25 mL/hr at 12/29/17 1237, 8 mg/hr at 12/29/17 1237 .  [START ON 01/01/2018] pantoprazole (PROTONIX) injection 40 mg, 40  mg, Intravenous, Q12H, Lance Coon, MD  Family History  Problem Relation Age of Onset  . Hypertension Mother   . Stroke Mother   . Hypertension Sister      Social History   Tobacco Use  . Smoking status: Former Research scientist (life sciences)  . Smokeless tobacco: Never Used  Substance Use Topics  . Alcohol use: Not Currently  . Drug use: Not Currently    Allergies as of 12/28/2017 - Review Complete 12/28/2017  Allergen Reaction Noted  . Penicillins Other (See Comments) 02/28/2014  .  Sulfa antibiotics Nausea Only 02/28/2014    Review of Systems:    All systems reviewed and negative except where noted Judy HPI.   Physical Exam:  Vital signs Judy last 24 hours: Temp:  [97.5 F (36.4 C)-98.3 F (36.8 C)] 98.3 F (36.8 C) (06/13 1235) Pulse Rate:  [70-113] 81 (06/13 1359) Resp:  [16-20] 16 (06/13 1235) BP: (97-131)/(46-78) 97/59 (06/13 1359) SpO2:  [98 %-100 %] 100 % (06/13 1235) Weight:  [112 lb (50.8 kg)] 112 lb (50.8 kg) (06/12 1922) Last BM Date: 12/29/17   General:   Restless, lethargic, thin built Head:  Normocephalic and atraumatic. Eyes:   No icterus.   Conjunctiva pale. PERRLA. Ears:  Normal auditory acuity. Neck:  Supple; no masses or thyroidomegaly Lungs: Respirations even and unlabored. Lungs clear to auscultation bilaterally.   No wheezes, crackles, or rhonchi.  Heart:  Regular rate and rhythm;  Without murmur, clicks, rubs or gallops Abdomen:  Soft, nondistended, nontender. Normal bowel sounds. No appreciable masses or hepatomegaly.  No rebound or guarding.  Rectal:  Not performed. Msk:  Symmetrical without gross deformities. Extremities:  Without edema, cyanosis or clubbing. Neurologic:  Alert and oriented x3;  grossly normal neurologically. Skin:  Intact without significant lesions or rashes. Psych:  Alert and cooperative. Normal affect.  LAB RESULTS: CBC Latest Ref Rng & Units 12/29/2017 12/29/2017 12/28/2017  WBC 3.6 - 11.0 K/uL 12.1(H) - 9.2  Hemoglobin 12.0 - 16.0 g/dL 7.0(L) 7.2(L) 8.3(L)  Hematocrit 35.0 - 47.0 % 20.8(L) - 24.7(L)  Platelets 150 - 440 K/uL 234 - 267    BMET BMP Latest Ref Rng & Units 12/29/2017 12/28/2017 12/15/2011  Glucose 65 - 99 mg/dL 94 168(H) -  BUN 6 - 20 mg/dL 36(H) 46(H) -  Creatinine 0.44 - 1.00 mg/dL 1.09(H) 1.10(H) 1.05  Sodium 135 - 145 mmol/L 139 137 -  Potassium 3.5 - 5.1 mmol/L 3.5 3.8 -  Chloride 101 - 111 mmol/L 112(H) 104 -  CO2 22 - 32 mmol/L 22 23 -  Calcium 8.9 - 10.3 mg/dL 7.6(L) 8.0(L) -     LFT Hepatic Function Latest Ref Rng & Units 12/28/2017  Total Protein 6.5 - 8.1 g/dL 5.5(L)  Albumin 3.5 - 5.0 g/dL 3.2(L)  AST 15 - 41 U/L 28  ALT 14 - 54 U/L 12(L)  Alk Phosphatase 38 - 126 U/L 37(L)  Total Bilirubin 0.3 - 1.2 mg/dL 0.4     STUDIES: No results found.    Impression / Plan:   Judy Torres is a 72 y.o. asian female with 1 day history of several episodes of hematochezia, hemodynamically significant GI bleed. Differentials include upper GI bleed or diverticular bleed or ischemic colitis  - Strict nothing by mouth - continue pantoprazole drip - Nuclear medicine bleeding scan - Transfer to ICU - Recommend EGD and colonoscopy after adequate resuscitation - Ordered 2 more units of PRBCs - Monitor H&H every 6 hours - I personally discussed plan of  care with patient's husband and Dr. Jerelyn Charles  Thank you for involving me Judy the care of this patient.  We will follow along with you    LOS: 1 day   Sherri Sear, MD  12/29/2017, 2:34 PM   Note: This dictation was prepared with Dragon dictation along with smaller phrase technology. Any transcriptional errors that result from this process are unintentional.

## 2017-12-29 NOTE — Progress Notes (Addendum)
Patient transferred to nuclear med then will transfer to ICU room 2 once scan is complete

## 2017-12-29 NOTE — Progress Notes (Signed)
RN discussed with Dr. Mortimer Fries about patient going to bleeding scan and made MD aware of current blood pressure of 95/54 and A&Ox4.  MD gave order that it is okay for patient to go to bleeding scan from [facility] without RN.

## 2017-12-29 NOTE — Progress Notes (Signed)
Arrived to ICU from bleeding scan.  A&Ox4 in NSR.  Blood pressure stable.  No active bleeding.  Family at bedside and report given to [place], [place].

## 2017-12-29 NOTE — Progress Notes (Signed)
Dr Marius Ditch was in to see the patient.  Patient was very restless and appears to be feeling very bad.  Very weak.  This is a change in her status.  She was assisted to the bedside toilet and had a moderate bloody/brown stool.  After being assisted back to bed she immediately had the urge to have another BM.  This one was very large and bloodier with multiple blood clots.  Dr Marius Ditch ordered a nuclear med bleeding study and spoke with Dr Jerelyn Charles who will be transferring the patient to ICU

## 2017-12-29 NOTE — Consult Note (Signed)
Name: Judy Torres MRN: [id number] DOB: 1946-03-03    ADMISSION DATE:  12/28/2017 CONSULTATION DATE: 12/29/2017  REFERRING MD : Dr. Jerelyn Charles   CHIEF COMPLAINT: GI Bleed   BRIEF PATIENT DESCRIPTION:  72 yo female admitted to medsurg unit on 06/12 with GI bleed transferred to stepdown unit on 06/13 due to active bleeding   SIGNIFICANT EVENTS/STUDIES:  06/12 Pt admitted to medsurg unit  06/13 Pt transferred to stepdown unit with active GI bleed  06/13 Pt received 1 unit pRBC's 06/13 GI Blood Loss Study: No evidence of GI bleed  HISTORY OF PRESENT ILLNESS:   This is a 72 yo female with a PMH of Lupus, HTN, and GERD.  She presented to [facility] ER on 06/12 with several episodes of dark red bleeding per rectum with clots, lower abdominal cramps and severe weakness onset of symptoms 24 hrs prior to presentation. She denied having a hx of GI bleeding. Judy the ER she was guaiac positive, hgb 8.3, and hct 24.7.  She was subsequently admitted to the [facility] unit by hospitalist team for further workup and treatment.  On 06/13 she had an episode of burgundy colored bowel movement followed by another large episode of bleeding per rectum.  Due to concerns of hypotension and active bleeding pt transferred to stepdown unit for further workup and treatment.  On 06/13 GI blood loss study negative, per GI plans for EGD and Colonoscopy once pt adequately resuscitated.    PAST MEDICAL HISTORY :   has a past medical history of GERD (gastroesophageal reflux disease), HTN (hypertension), and Lupus ([place]).  has a past surgical history that includes Cesarean section and Cardiac surgery. Prior to Admission medications   Medication Sig Start Date End Date Taking? Authorizing Provider  alendronate (FOSAMAX) 70 MG tablet Take by mouth.   Yes [provider]  aspirin EC 81 MG tablet Take 81 mg by mouth. 05/21/13  Yes [provider]  ezetimibe-simvastatin (VYTORIN) 10-40 MG per tablet Take 1 tablet by  mouth daily.   Yes [provider]  hydroxychloroquine (PLAQUENIL) 200 MG tablet TAKE 1 TABLET BY MOUTH TWICE A DAY 01/22/14  Yes [provider]  lisinopril (PRINIVIL,ZESTRIL) 10 MG tablet Take 10 mg by mouth daily.   Yes [provider]  predniSONE (DELTASONE) 5 MG tablet Take 5 mg by mouth. 12/12/13  Yes [provider]   Allergies  Allergen Reactions  . Penicillins Other (See Comments)    Pass out  . Sulfa Antibiotics Nausea Only    FAMILY HISTORY:  family history includes Hypertension Judy her mother and sister; Stroke Judy her mother. SOCIAL HISTORY:  reports that she has quit smoking. She has never used smokeless tobacco. She reports that she drank alcohol. She reports that she has current or past drug history.  REVIEW OF SYSTEMS: Positives Judy BOLD  Constitutional: fever, chills, weight loss, malaise/fatigue and diaphoresis.  HENT: Negative for hearing loss, ear pain, nosebleeds, congestion, sore throat, neck pain, tinnitus and ear discharge.   Eyes: Negative for blurred vision, double vision, photophobia, pain, discharge and redness.  Respiratory: Negative for cough, hemoptysis, sputum production, shortness of breath, wheezing and stridor.   Cardiovascular: Negative for chest pain, palpitations, orthopnea, claudication, leg swelling and PND.  Gastrointestinal: heartburn, nausea, vomiting, abdominal cramping, diarrhea, constipation, blood Judy stool and melena.  Genitourinary: Negative for dysuria, urgency, frequency, hematuria and flank pain.  Musculoskeletal: Negative for myalgias, back pain, joint pain and falls.  Skin: Negative for itching and rash.  Neurological: dizziness, tingling, tremors, sensory change, speech change, focal weakness, seizures, loss of consciousness, weakness and headaches.  Endo/Heme/Allergies: Negative for environmental allergies and polydipsia. Does not bruise/bleed easily.  SUBJECTIVE:  c/o abdominal pain   VITAL  SIGNS: Temp:  [97.6 F (36.4 C)-98.3 F (36.8 C)] 98.3 F (36.8 C) (06/13 1900) Pulse Rate:  [70-81] 80 (06/13 1900) Resp:  [16-22] 22 (06/13 1900) BP: (94-131)/(46-78) 95/64 (06/13 1900) SpO2:  [94 %-100 %] 100 % (06/13 1900) Weight:  [45.3 kg (99 lb 13.9 oz)] 45.3 kg (99 lb 13.9 oz) (06/13 1829)  PHYSICAL EXAMINATION: General: elderly female resting Judy bed, NAD  Neuro: alert and oriented, follows commands  HEENT: supple, no JVD Cardiovascular: nsr, rrr, no M/R/G Lungs: clear throughout, even, non labored  Abdomen: +BS x4, soft, tenderness, non distended  Musculoskeletal: normal bulk and tone, no edema  Skin: intact no rashes or lesions   Recent Labs  Lab 12/28/17 1928 12/29/17 0709  NA 137 139  K 3.8 3.5  CL 104 112*  CO2 23 22  BUN 46* 36*  CREATININE 1.10* 1.09*  GLUCOSE 168* 94   Recent Labs  Lab 12/28/17 1928 12/29/17 0136 12/29/17 0709 12/29/17 1844  HGB 8.3* 7.2* 7.0* 6.9*  HCT 24.7*  --  20.8*  --   WBC 9.2  --  12.1*  --   PLT 267  --  234  --    Nm Gi Blood Loss  Result Date: 12/29/2017 CLINICAL DATA:  GI bleed beginning 12/28/2017 with some lower abdominal cramping. EXAM: NUCLEAR MEDICINE GASTROINTESTINAL BLEEDING SCAN TECHNIQUE: Sequential abdominal images were obtained following intravenous administration of Tc-58m labeled red blood cells up to 2 hours. RADIOPHARMACEUTICALS:  21.95 mCi Tc-9m pertechnetate Judy-vitro labeled red cells. COMPARISON:  CT 12/15/2011 FINDINGS: Examination demonstrates no evidence of GI bleed on the 1 hour or 2 hours sequential images. 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 IMPRESSION: No evidence of GI bleed. Electronically Signed   By: Marin Olp M.D.   On: 12/29/2017 18:25    ASSESSMENT / PLAN: Gastrointestinal Bleeding  Acute renal failure  Acute Pain  Hx: Lupus, HTN, and GERD  P: Supplemental O2 for dyspnea and/or hypoxia  Continuous telemetry monitoring  Maintain map with aggressive fluid  resuscitation  Continue D5W1/2NS @100  ml/hr Hold outpatient antihypertensives  VTE px: SCD's, avoid chemical prophylaxis  Serial H&H Monitor for s/sx of bleeding  Transfuse for hgb <7 and/or active signs of bleeding  Continue protonix gtt  Keep NPO  GI consulted appreciate input-plans for EGD and Colonoscopy after adequate resuscitation  Trend BMP  Replace electrolytes as indicated Monitor UOP  Prn morphine for pain management   Marda Stalker, [place] Pager [phone] (please enter 7 digits) PCCM Consult Pager [phone] (please enter 7 digits)

## 2017-12-29 NOTE — Progress Notes (Signed)
[place] [facility] [place] NAME: [name]    MR#:  [id number]  DATE OF BIRTH:  1946-03-02  SUBJECTIVE:  CHIEF COMPLAINT:   Chief Complaint  Patient presents with  . Rectal Bleeding  Patient complains of increased thirst only, husband at the bedside, no events overnight per nursing staff, await GI recommendations/evaluation  REVIEW OF SYSTEMS:  CONSTITUTIONAL: No fever, fatigue or weakness.  EYES: No blurred or double vision.  EARS, NOSE, AND THROAT: No tinnitus or ear pain.  RESPIRATORY: No cough, shortness of breath, wheezing or hemoptysis.  CARDIOVASCULAR: No chest pain, orthopnea, edema.  GASTROINTESTINAL: No nausea, vomiting, diarrhea or abdominal pain.  GENITOURINARY: No dysuria, hematuria.  ENDOCRINE: No polyuria, nocturia,  HEMATOLOGY: No anemia, easy bruising or bleeding SKIN: No rash or lesion. MUSCULOSKELETAL: No joint pain or arthritis.   NEUROLOGIC: No tingling, numbness, weakness.  PSYCHIATRY: No anxiety or depression.   ROS  DRUG ALLERGIES:   Allergies  Allergen Reactions  . Penicillins Other (See Comments)    Pass out  . Sulfa Antibiotics Nausea Only    VITALS:  Blood pressure (!) 111/57, pulse 73, temperature 98.1 F (36.7 C), temperature source Oral, resp. rate 16, height 4\' 10"  (1.473 m), weight 50.8 kg (112 lb), SpO2 99 %.  PHYSICAL EXAMINATION:  GENERAL:  72 y.o.-year-old patient lying in the bed with no acute distress.  EYES: Pupils equal, round, reactive to light and accommodation. No scleral icterus. Extraocular muscles intact.  HEENT: Head atraumatic, normocephalic. Oropharynx and nasopharynx clear.  NECK:  Supple, no jugular venous distention. No thyroid enlargement, no tenderness.  LUNGS: Normal breath sounds bilaterally, no wheezing, rales,rhonchi or crepitation. No use of accessory muscles of respiration.  CARDIOVASCULAR: S1, S2 normal. No murmurs, rubs, or gallops.  ABDOMEN: Soft, nontender,  nondistended. Bowel sounds present. No organomegaly or mass.  EXTREMITIES: No pedal edema, cyanosis, or clubbing.  NEUROLOGIC: Cranial nerves II through XII are intact. Muscle strength 5/5 in all extremities. Sensation intact. Gait not checked.  PSYCHIATRIC: The patient is alert and oriented x 3.  SKIN: No obvious rash, lesion, or ulcer.   Physical Exam LABORATORY PANEL:   CBC Recent Labs  Lab 12/29/17 0709  WBC 12.1*  HGB 7.0*  HCT 20.8*  PLT 234   ------------------------------------------------------------------------------------------------------------------  Chemistries  Recent Labs  Lab 12/28/17 1928 12/29/17 0709  NA 137 139  K 3.8 3.5  CL 104 112*  CO2 23 22  GLUCOSE 168* 94  BUN 46* 36*  CREATININE 1.10* 1.09*  CALCIUM 8.0* 7.6*  AST 28  --   ALT 12*  --   ALKPHOS 37*  --   BILITOT 0.4  --    ------------------------------------------------------------------------------------------------------------------  Cardiac Enzymes No results for input(s): TROPONINI in the last 168 hours. ------------------------------------------------------------------------------------------------------------------  RADIOLOGY:  No results found.  ASSESSMENT AND PLAN:  *Acute rectal bleeding Etiology unknown Has never had endoscopy in the past Continue n.p.o. status except for meds, Protonix drip, IV fluids for rehydration, await gastroenterology evaluation/recommendations, avoid antiplatelet/NSAIDs/anticoagulants, H&H checks, CBC daily, and transfuse as needed  *Acute blood loss anemia Secondary to above Transfuse 1 unit packed red blood cells and all other plans as stated above  *Chronic lupus Stable Continue home regiment  *Chronic benign essential hypertension Stable  *Chronic GERD PPI as above  All the records are reviewed and case discussed with Care Management/Social Workerr. Management plans discussed with the patient, family and they are in  agreement.  CODE STATUS: full  TOTAL TIME TAKING CARE OF THIS PATIENT: 35 minutes.     POSSIBLE D/C IN 1-2 DAYS, DEPENDING ON CLINICAL CONDITION.   Avel Peace [name] M.D on 12/29/2017   Between 7am to 6pm - Pager - [phone]  After 6pm go to [email or web address] - password [email or web address] [facility]  Office  [phone]  CC: Primary care physician; [name], MD  Note: This dictation was prepared with Dragon dictation along with smaller phrase technology. Any transcriptional errors that result from this process are unintentional.

## 2017-12-29 NOTE — Progress Notes (Signed)
Initial Nutrition Assessment  DOCUMENTATION CODES:   Not applicable  INTERVENTION:   When diet advances:  Ensure Enlive po BID, each supplement provides 350 kcal and 20 grams of protein  MVI daily  NUTRITION DIAGNOSIS:   Inadequate oral intake related to acute illness as evidenced by NPO status.  GOAL:   Patient will meet greater than or equal to 90% of their needs  MONITOR:   Diet advancement, Labs, Weight trends, Skin, I & O's  REASON FOR ASSESSMENT:   Malnutrition Screening Tool    ASSESSMENT:   72 year old female with PMH of Lupus presents with 1 day hx of dark red blood in her stool, associated with some abdominal cramping and generalized weakness/fatigue.    Met with pt in room today. Pt really sleepy at time of RD visit so majority of history was obtained from pt's family members at bedside. Per family, pt with poor appetite and oral intake at baseline. Pt eats about 25% of her prepared meals at home. Pt loves rice, fruit and fish. Pt was drinking about 1/2 of an Ensure daily, but family reports that lately she has stopped drinking all supplements. Family has encouraged pt to drink at least one supplement per day but they report "they just sit in the refrigerator". Per chart, pt is weight stable. RD provided family members with ideas of ways to add calories and protein to the foods that pt is willing to eat. Recommended trying different supplements. Pt currently NPO. RD will add supplements and MVI when diet advanced.   Medications reviewed and include: lasix, protonix  Labs reviewed: BUN 36(H), creat 1.09(H), Ca 7.6(L) Wbc- 12.1(H), Hgb 7.0(L), Hct 20.8(L)  NUTRITION - FOCUSED PHYSICAL EXAM:    Most Recent Value  Orbital Region  No depletion  Upper Arm Region  No depletion  Thoracic and Lumbar Region  No depletion  Buccal Region  No depletion  Temple Region  No depletion  Clavicle Bone Region  Mild depletion  Clavicle and Acromion Bone Region  Mild depletion   Scapular Bone Region  No depletion  Dorsal Hand  Mild depletion  Patellar Region  Mild depletion  Anterior Thigh Region  Mild depletion  Posterior Calf Region  Mild depletion  Edema (RD Assessment)  None  Hair  Reviewed  Eyes  Reviewed  Mouth  Reviewed  Skin  Reviewed  Nails  Reviewed     Diet Order:   Diet Order           Diet NPO time specified Except for: Sips with Meds  Diet effective now         EDUCATION NEEDS:   Education needs have been addressed(with pt's family members)  Skin:  Skin Assessment: Reviewed RN Assessment  Last BM:  6/13  Height:   Ht Readings from Last 1 Encounters:  12/28/17 4' 10" (1.473 m)    Weight:   Wt Readings from Last 1 Encounters:  12/28/17 112 lb (50.8 kg)    Ideal Body Weight:  44 kg  BMI:  Body mass index is 23.41 kg/m.  Estimated Nutritional Needs:   Kcal:  1200-1400kcal/day   Protein:  56-66g/day   Fluid:  >1.2L/day   Koleen Distance MS, RD, LDN Pager #- [phone] Office#- [phone] After Hours Pager: [phone]

## 2017-12-30 ENCOUNTER — Encounter: Payer: Self-pay | Admitting: *Deleted

## 2017-12-30 ENCOUNTER — Inpatient Hospital Stay: Payer: Medicare Other | Admitting: Anesthesiology

## 2017-12-30 ENCOUNTER — Encounter
Admission: EM | Disposition: A | Discharge status: Home / Self Care | Payer: Self-pay | Source: Home / Self Care | Attending: Family Medicine

## 2017-12-30 DIAGNOSIS — D127 Benign neoplasm of rectosigmoid junction: Secondary | ICD-10-CM

## 2017-12-30 DIAGNOSIS — K621 Rectal polyp: Secondary | ICD-10-CM

## 2017-12-30 DIAGNOSIS — D123 Benign neoplasm of transverse colon: Secondary | ICD-10-CM

## 2017-12-30 DIAGNOSIS — D124 Benign neoplasm of descending colon: Secondary | ICD-10-CM

## 2017-12-30 DIAGNOSIS — K922 Gastrointestinal hemorrhage, unspecified: Secondary | ICD-10-CM

## 2017-12-30 DIAGNOSIS — D62 Acute posthemorrhagic anemia: Secondary | ICD-10-CM

## 2017-12-30 DIAGNOSIS — D122 Benign neoplasm of ascending colon: Secondary | ICD-10-CM

## 2017-12-30 DIAGNOSIS — D125 Benign neoplasm of sigmoid colon: Secondary | ICD-10-CM

## 2017-12-30 HISTORY — PX: ESOPHAGOGASTRODUODENOSCOPY: SHX5428

## 2017-12-30 HISTORY — PX: COLONOSCOPY: SHX5424

## 2017-12-30 LAB — PREPARE RBC (CROSSMATCH)

## 2017-12-30 LAB — CBC
HCT: 27.8 % — ABNORMAL LOW (ref 35.0–47.0)
HEMOGLOBIN: 9.5 g/dL — AB (ref 12.0–16.0)
MCH: 30.9 pg (ref 26.0–34.0)
MCHC: 34.2 g/dL (ref 32.0–36.0)
MCV: 90.3 fL (ref 80.0–100.0)
PLATELETS: 131 10*3/uL — AB (ref 150–440)
RBC: 3.07 MIL/uL — AB (ref 3.80–5.20)
RDW: 14.2 % (ref 11.5–14.5)
WBC: 7.5 10*3/uL (ref 3.6–11.0)

## 2017-12-30 LAB — HEMOGLOBIN
Hemoglobin: 9.5 g/dL — ABNORMAL LOW (ref 12.0–16.0)
Hemoglobin: 9.4 g/dL — ABNORMAL LOW (ref 12.0–16.0)
Hemoglobin: 8.8 g/dL — ABNORMAL LOW (ref 12.0–16.0)

## 2017-12-30 LAB — BASIC METABOLIC PANEL
Anion gap: 7 (ref 5–15)
BUN: 24 mg/dL — AB (ref 6–20)
CHLORIDE: 112 mmol/L — AB (ref 101–111)
CO2: 22 mmol/L (ref 22–32)
Calcium: 7.2 mg/dL — ABNORMAL LOW (ref 8.9–10.3)
Creatinine, Ser: 0.92 mg/dL (ref 0.44–1.00)
GFR calc Af Amer: >60 mL/min (ref >60)
GFR calc non Af Amer: >60 mL/min (ref >60)
GLUCOSE: 78 mg/dL (ref 65–99)
POTASSIUM: 3.1 mmol/L — AB (ref 3.5–5.1)
Sodium: 141 mmol/L (ref 135–145)

## 2017-12-30 LAB — GLUCOSE, CAPILLARY: GLUCOSE-CAPILLARY: 72 mg/dL (ref 65–99)

## 2017-12-30 LAB — MAGNESIUM: MAGNESIUM: 1.7 mg/dL (ref 1.7–2.4)

## 2017-12-30 LAB — HIV ANTIBODY (ROUTINE TESTING W REFLEX): HIV Screen 4th Generation wRfx: NONREACTIVE

## 2017-12-30 SURGERY — EGD (ESOPHAGOGASTRODUODENOSCOPY)
Anesthesia: General

## 2017-12-30 MED ORDER — PROPOFOL 10 MG/ML IV BOLUS
INTRAVENOUS | Status: DC | PRN
  Administered 2017-12-30: 20 mg via INTRAVENOUS
  Administered 2017-12-30: 30 mg via INTRAVENOUS

## 2017-12-30 MED ORDER — FENTANYL CITRATE (PF) 100 MCG/2ML IJ SOLN
INTRAMUSCULAR | Status: DC | PRN
  Administered 2017-12-30: 25 ug via INTRAVENOUS

## 2017-12-30 MED ORDER — PANTOPRAZOLE SODIUM 40 MG PO TBEC
40.0000 mg | DELAYED_RELEASE_TABLET | Freq: Every day | ORAL | Status: DC
  Administered 2017-12-30 – 2018-01-02 (×4): 40 mg via ORAL
  Filled 2017-12-30 (×5): qty 1

## 2017-12-30 MED ORDER — MIDAZOLAM HCL 2 MG/2ML IJ SOLN
INTRAMUSCULAR | Status: AC
  Filled 2017-12-30: qty 2

## 2017-12-30 MED ORDER — FENTANYL CITRATE (PF) 100 MCG/2ML IJ SOLN
INTRAMUSCULAR | Status: AC
  Filled 2017-12-30: qty 2

## 2017-12-30 MED ORDER — MIDAZOLAM HCL 2 MG/2ML IJ SOLN
INTRAMUSCULAR | Status: DC | PRN
  Administered 2017-12-30: 1 mg via INTRAVENOUS

## 2017-12-30 MED ORDER — SODIUM CHLORIDE 0.9 % IV SOLN
INTRAVENOUS | Status: DC
  Administered 2017-12-30 (×2): via INTRAVENOUS

## 2017-12-30 MED ORDER — PROPOFOL 500 MG/50ML IV EMUL
INTRAVENOUS | Status: DC | PRN
  Administered 2017-12-30: 150 ug/kg/min via INTRAVENOUS

## 2017-12-30 MED ORDER — POTASSIUM CHLORIDE 10 MEQ/100ML IV SOLN
10.0000 meq | INTRAVENOUS | Status: AC
  Administered 2017-12-30 (×4): 10 meq via INTRAVENOUS
  Filled 2017-12-30 (×4): qty 100

## 2017-12-30 MED ORDER — EPHEDRINE SULFATE 50 MG/ML IJ SOLN
INTRAMUSCULAR | Status: DC | PRN
  Administered 2017-12-30: 15 mg via INTRAVENOUS

## 2017-12-30 MED ORDER — SODIUM CHLORIDE 0.9 % IV SOLN
510.0000 mg | Freq: Once | INTRAVENOUS | Status: AC
  Administered 2017-12-30: 510 mg via INTRAVENOUS
  Filled 2017-12-30: qty 17

## 2017-12-30 MED ORDER — PHENYLEPHRINE HCL 10 MG/ML IJ SOLN
INTRAMUSCULAR | Status: DC | PRN
  Administered 2017-12-30 (×7): 100 ug via INTRAVENOUS

## 2017-12-30 MED ORDER — DEXTROSE-NACL 5-0.45 % IV SOLN
INTRAVENOUS | Status: DC
  Administered 2017-12-30: 11:00:00 via INTRAVENOUS

## 2017-12-30 NOTE — Progress Notes (Signed)
Spoke with patient about consent to EGD and colonoscopy and patient stated she had not spoken with Doctor Marius Ditch in detail yet. Consent is pending signature at this point.

## 2017-12-30 NOTE — Progress Notes (Signed)
RN made Dr. Jefferson Fuel aware that CBG this morning was 72 and that patient had been on d5 0.45% saline but it has been stopped during night shift.  MD gave order to start d5 0.45% saline at 50 cc/H.

## 2017-12-30 NOTE — Progress Notes (Signed)
Patient returned from endo and is A&Ox4.  Denies pain and talking with her husband who is at bedside.  RN notified Dr. Jefferson Fuel of patient's return and that per Dr. Marius Ditch patient can have a full liquid diet and be moved to floor bed if ok with intensivist.  Dr. Samuel Germany gave order for patient to be transferred to any med surg unit and to discontinue d5 IVF.

## 2017-12-30 NOTE — Transfer of Care (Signed)
Immediate Anesthesia Transfer of Care Note  Patient: Judy Torres  Procedure(s) Performed: ESOPHAGOGASTRODUODENOSCOPY (EGD) (N/A ) COLONOSCOPY (N/A )  Patient Location: PACU  Anesthesia Type:General  Level of Consciousness: sedated  Airway & Oxygen Therapy: Patient Spontanous Breathing and Patient connected to nasal cannula oxygen  Post-op Assessment: Report given to RN and Post -op Vital signs reviewed and stable  Post vital signs: Reviewed and stable  Last Vitals:  Vitals Value Taken Time  BP 102/62 12/30/2017  2:15 PM  Temp    Pulse 70 12/30/2017  2:13 PM  Resp 22 12/30/2017  2:16 PM  SpO2 100 % 12/30/2017  2:13 PM  Vitals shown include unvalidated device data.  Last Pain:  Vitals:   12/30/17 1210  TempSrc: Tympanic  PainSc: 0-No pain      Patients Stated Pain Goal: 0 (36/12/24 4975)  Complications: No apparent anesthesia complications

## 2017-12-30 NOTE — Anesthesia Preprocedure Evaluation (Signed)
Anesthesia Evaluation  Patient identified by MRN, date of birth, ID band Patient awake    Reviewed: Allergy & Precautions, H&P , NPO status , Patient's Chart, lab work & pertinent test results, reviewed documented beta blocker date and time   Airway Mallampati: II   Neck ROM: full    Dental  (+) Poor Dentition   Pulmonary neg pulmonary ROS, former smoker,    Pulmonary exam normal        Cardiovascular Exercise Tolerance: Poor hypertension, On Medications negative cardio ROS Normal cardiovascular exam Rhythm:regular Rate:Normal     Neuro/Psych negative neurological ROS  negative psych ROS   GI/Hepatic negative GI ROS, Neg liver ROS, GERD  Medicated,  Endo/Other  negative endocrine ROS  Renal/GU negative Renal ROS  negative genitourinary   Musculoskeletal   Abdominal   Peds  Hematology negative hematology ROS (+)   Anesthesia Other Findings Past Medical History: No date: GERD (gastroesophageal reflux disease) No date: HTN (hypertension) No date: Lupus ([place]) Past Surgical History: No date: CARDIAC SURGERY No date: CESAREAN SECTION BMI    Body Mass Index:  20.17 kg/m     Reproductive/Obstetrics negative OB ROS                             Anesthesia Physical Anesthesia Plan  ASA: III and emergent  Anesthesia Plan: General   Post-op Pain Management:    Induction:   PONV Risk Score and Plan:   Airway Management Planned:   Additional Equipment:   Intra-op Plan:   Post-operative Plan:   Informed Consent: I have reviewed the patients History and Physical, chart, labs and discussed the procedure including the risks, benefits and alternatives for the proposed anesthesia with the patient or authorized representative who has indicated his/her understanding and acceptance.   Dental Advisory Given  Plan Discussed with: CRNA  Anesthesia Plan Comments:         Anesthesia  Quick Evaluation

## 2017-12-30 NOTE — Progress Notes (Signed)
[facility] NAME: [name] Torres    MR#:  [id number]  DATE OF BIRTH:  Nov 16, 1945  SUBJECTIVE:  CHIEF COMPLAINT:   Chief Complaint  Patient presents with  . Rectal Bleeding  No events overnight, the patient and the patient's husband without complaints, no further bleeding events per patient  REVIEW OF SYSTEMS:  CONSTITUTIONAL: No fever, fatigue or weakness.  EYES: No blurred or double vision.  EARS, NOSE, AND THROAT: No tinnitus or ear pain.  RESPIRATORY: No cough, shortness of breath, wheezing or hemoptysis.  CARDIOVASCULAR: No chest pain, orthopnea, edema.  GASTROINTESTINAL: No nausea, vomiting, diarrhea or abdominal pain.  GENITOURINARY: No dysuria, hematuria.  ENDOCRINE: No polyuria, nocturia,  HEMATOLOGY: No anemia, easy bruising or bleeding SKIN: No rash or lesion. MUSCULOSKELETAL: No joint pain or arthritis.   NEUROLOGIC: No tingling, numbness, weakness.  PSYCHIATRY: No anxiety or depression.   ROS  DRUG ALLERGIES:   Allergies  Allergen Reactions  . Penicillins Other (See Comments)    Pass out  . Sulfa Antibiotics Nausea Only    VITALS:  Blood pressure 118/62, pulse (!) 56, temperature (!) 97 F (36.1 C), temperature source Tympanic, resp. rate (!) 22, height 4\' 11"  (1.499 m), weight 45.3 kg (99 lb 13.9 oz), SpO2 97 %.  PHYSICAL EXAMINATION:  GENERAL:  72 y.o.-year-old patient lying in the bed with no acute distress.  EYES: Pupils equal, round, reactive to light and accommodation. No scleral icterus. Extraocular muscles intact.  HEENT: Head atraumatic, normocephalic. Oropharynx and nasopharynx clear.  NECK:  Supple, no jugular venous distention. No thyroid enlargement, no tenderness.  LUNGS: Normal breath sounds bilaterally, no wheezing, rales,rhonchi or crepitation. No use of accessory muscles of respiration.  CARDIOVASCULAR: S1, S2 normal. No murmurs, rubs, or gallops.  ABDOMEN: Soft, nontender,  nondistended. Bowel sounds present. No organomegaly or mass.  EXTREMITIES: No pedal edema, cyanosis, or clubbing.  NEUROLOGIC: Cranial nerves II through XII are intact. Muscle strength 5/5 in all extremities. Sensation intact. Gait not checked.  PSYCHIATRIC: The patient is alert and oriented x 3.  SKIN: No obvious rash, lesion, or ulcer.   Physical Exam LABORATORY PANEL:   CBC Recent Labs  Lab 12/30/17 0423 12/30/17 1058  WBC 7.5  --   HGB 9.5* 9.5*  HCT 27.8*  --   PLT 131*  --    ------------------------------------------------------------------------------------------------------------------  Chemistries  Recent Labs  Lab 12/28/17 1928  12/30/17 0423  NA 137   < > 141  K 3.8   < > 3.1*  CL 104   < > 112*  CO2 23   < > 22  GLUCOSE 168*   < > 78  BUN 46*   < > 24*  CREATININE 1.10*   < > 0.92  CALCIUM 8.0*   < > 7.2*  MG  --   --  1.7  AST 28  --   --   ALT 12*  --   --   ALKPHOS 37*  --   --   BILITOT 0.4  --   --    < > = values in this interval not displayed.   ------------------------------------------------------------------------------------------------------------------  Cardiac Enzymes No results for input(s): TROPONINI in the last 168 hours. ------------------------------------------------------------------------------------------------------------------  RADIOLOGY:  Nm Gi Blood Loss  Result Date: 12/29/2017 CLINICAL DATA:  GI bleed beginning 12/28/2017 with some lower abdominal cramping. EXAM: NUCLEAR MEDICINE GASTROINTESTINAL BLEEDING SCAN TECHNIQUE: Sequential abdominal images were obtained following intravenous administration of Tc-66m labeled  red blood cells up to 2 hours. RADIOPHARMACEUTICALS:  21.95 mCi Tc-58m pertechnetate in-vitro labeled red cells. COMPARISON:  CT 12/15/2011 FINDINGS: Examination demonstrates no evidence of GI bleed on the 1 hour or 2 hours sequential images. 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 IMPRESSION: No  evidence of GI bleed. Electronically Signed   By: Marin Olp M.D.   On: 12/29/2017 18:25    ASSESSMENT AND PLAN:  *Acute rectal bleeding Etiology unknown Has never had endoscopy in the past Continue n.p.o. status except for meds, Protonix drip, IV fluids for rehydration, for endoscopy later today by gastroenterology, continue to avoid antiplatelet/NSAIDs/anticoagulants, H&H checks, CBC daily, and transfuse as needed  *Acute traumatic anemia due to acute blood loss  Improved status post red blood cell transfusions Plan of care as stated above  *Chronic lupus Stable Continue home regiment  *Chronic benign essential hypertension Stable  *Chronic GERD PPI as above   All the records are reviewed and case discussed with Care Management/Social Workerr. Management plans discussed with the patient, family and they are in agreement.  CODE STATUS: full  TOTAL TIME TAKING CARE OF THIS PATIENT: 35 minutes.     POSSIBLE D/C IN 1-2 DAYS, DEPENDING ON CLINICAL CONDITION.   Avel Peace [name] M.D on 12/30/2017   Between 7am to 6pm - Pager - [phone]  After 6pm go to [email or web address] - password [email or web address] [facility]  Office  [phone]  CC: Primary care physician; [name], MD  Note: This dictation was prepared with Dragon dictation along with smaller phrase technology. Any transcriptional errors that result from this process are unintentional.

## 2017-12-30 NOTE — Op Note (Signed)
[facility] Gastroenterology Patient Name: [name] Procedure Date: 12/30/2017 12:57 PM MRN: [id number] Account #: [id number] Date of Birth: [birth year] Admit Type: Outpatient Age: 72 Room: [facility] ENDO ROOM 2 Gender: Female Note Status: Finalized Procedure:            Upper GI endoscopy Indications:          Acute post hemorrhagic anemia, Hematochezia Providers:            Lin Landsman MD, MD Medicines:            Monitored Anesthesia Care Complications:        No immediate complications. Estimated blood loss:                        Minimal. Procedure:            Pre-Anesthesia Assessment:                       - Prior to the procedure, a History and Physical was                        performed, and patient medications and allergies were                        reviewed. The patient is competent. The risks and                        benefits of the procedure and the sedation options and                        risks were discussed with the patient. All questions                        were answered and informed consent was obtained.                        Patient identification and proposed procedure were                        verified by the physician, the nurse, the                        anesthesiologist, the anesthetist and the technician in                        the pre-procedure area in the procedure room in the                        endoscopy suite. Mental Status Examination: alert and                        oriented. Airway Examination: normal oropharyngeal                        airway and neck mobility. Respiratory Examination:                        clear to auscultation. CV Examination: normal.                        Prophylactic  Antibiotics: The patient does not require                        prophylactic antibiotics. Prior Anticoagulants: The                        patient has taken no previous anticoagulant or   antiplatelet agents. ASA Grade Assessment: III - A                        patient with severe systemic disease. After reviewing                        the risks and benefits, the patient was deemed in                        satisfactory condition to undergo the procedure. The                        anesthesia plan was to use monitored anesthesia care                        (MAC). Immediately prior to administration of                        medications, the patient was re-assessed for adequacy                        to receive sedatives. The heart rate, respiratory rate,                        oxygen saturations, blood pressure, adequacy of                        pulmonary ventilation, and response to care were                        monitored throughout the procedure. The physical status                        of the patient was re-assessed after the procedure.                       After obtaining informed consent, the endoscope was                        passed under direct vision. Throughout the procedure,                        the patient's blood pressure, pulse, and oxygen                        saturations were monitored continuously. The Endoscope                        was introduced through the mouth, and advanced to the                        second part of duodenum. The upper GI endoscopy was  accomplished without difficulty. The patient tolerated                        the procedure well. Findings:      The duodenal bulb and second portion of the duodenum were normal.      A few dispersed, small non-bleeding erosions were found in the gastric       antrum. There were no stigmata of recent bleeding. Biopsies were taken       with a cold forceps for Helicobacter pylori testing.      The cardia, gastric fundus and gastric body were normal. Biopsies were       taken with a cold forceps for Helicobacter pylori testing.      The gastroesophageal junction and  examined esophagus were normal. Impression:           - Normal duodenal bulb and second portion of the                        duodenum.                       - Non-bleeding erosive gastropathy. Biopsied.                       - Normal cardia, gastric fundus and gastric body.                        Biopsied.                       - Normal gastroesophageal junction and esophagus. Recommendation:       - Await pathology results.                       - Proceed with colonoscopy as scheduled                       - See colonoscopy report                       - Discontinue protonix drip Procedure Code(s):    --- Professional ---                       [phone], Esophagogastroduodenoscopy, flexible, transoral;                        with biopsy, single or multiple CPT copyright 2017 American Medical Association. All rights reserved. The codes documented in this report are preliminary and upon coder review may  be revised to meet current compliance requirements. Dr. Ulyess Mort Lin Landsman MD, MD 12/30/2017 1:19:59 PM This report has been signed electronically. Number of Addenda: 0 Note Initiated On: 12/30/2017 12:57 PM      [facility]

## 2017-12-30 NOTE — Progress Notes (Signed)
Patient moved to room 204 by wheelchair with this RN and her family at side.  Alert with no distress noted.  Terri, RN aware patient is in new room.

## 2017-12-30 NOTE — Progress Notes (Signed)
Patient transferred to endo by bed on tele with Roselyn Reef, orderly. Alert with no distress noted when leaving ICU.  Report given to Magda Paganini, RN in endo prior to transfer.   Husband at side when leaving ICU.

## 2017-12-30 NOTE — Progress Notes (Signed)
Report called to Terri, RN on [place].

## 2017-12-30 NOTE — Anesthesia Post-op Follow-up Note (Signed)
Anesthesia QCDR form completed.        

## 2017-12-30 NOTE — Op Note (Signed)
[facility] Gastroenterology Patient Name: Judy Torres Procedure Date: 12/30/2017 12:58 PM MRN: [id number] Account #: [id number] Date of Birth: May 30, 1946 Admit Type: Outpatient Age: 72 Room: [facility] ENDO ROOM 2 Gender: Female Note Status: Finalized Procedure:            Colonoscopy Indications:          Hematochezia, Acute post hemorrhagic anemia Providers:            Lin Landsman MD, MD Medicines:            Monitored Anesthesia Care Complications:        No immediate complications. Estimated blood loss: None. Procedure:            Pre-Anesthesia Assessment:                       - Prior to the procedure, a History and Physical was                        performed, and patient medications and allergies were                        reviewed. The patient is competent. The risks and                        benefits of the procedure and the sedation options and                        risks were discussed with the patient. All questions                        were answered and informed consent was obtained.                        Patient identification and proposed procedure were                        verified by the physician, the nurse, the                        anesthesiologist, the anesthetist and the technician Judy                        the pre-procedure area Judy the procedure room Judy the                        endoscopy suite. Mental Status Examination: alert and                        oriented. Airway Examination: normal oropharyngeal                        airway and neck mobility. Respiratory Examination:                        clear to auscultation. CV Examination: normal.                        Prophylactic Antibiotics: The patient does not require  prophylactic antibiotics. Prior Anticoagulants: The                        patient has taken no previous anticoagulant or                        antiplatelet agents. ASA Grade  Assessment: III - A                        patient with severe systemic disease. After reviewing                        the risks and benefits, the patient was deemed Judy                        satisfactory condition to undergo the procedure. The                        anesthesia plan was to use monitored anesthesia care                        (MAC). Immediately prior to administration of                        medications, the patient was re-assessed for adequacy                        to receive sedatives. The heart rate, respiratory rate,                        oxygen saturations, blood pressure, adequacy of                        pulmonary ventilation, and response to care were                        monitored throughout the procedure. The physical status                        of the patient was re-assessed after the procedure.                       After obtaining informed consent, the colonoscope was                        passed under direct vision. Throughout the procedure,                        the patient's blood pressure, pulse, and oxygen                        saturations were monitored continuously. The                        Colonoscope was introduced through the anus and                        advanced to the the terminal ileum. The colonoscopy was  performed with moderate difficulty due to a tortuous                        colon and the patient's body habitus. Successful                        completion of the procedure was aided by applying                        abdominal pressure. The patient tolerated the procedure                        well. The quality of the bowel preparation was good. Findings:      The perianal and digital rectal examinations were normal. Pertinent       negatives include normal sphincter tone and no palpable rectal lesions.      A 5 mm polyp was found Judy the ascending colon. The polyp was sessile.       The polyp was  removed with a cold snare. Resection and retrieval were       complete.      Two sessile polyps were found Judy the transverse colon. The polyps were 7       mm Judy size. These polyps were removed with a hot snare. Resection and       retrieval were complete.      Five sessile polyps were found Judy the descending colon. The polyps were       5 to 7 mm Judy size. These polyps were removed with a hot snare. Resection       and retrieval were complete.      A 7 mm polyp was found Judy the sigmoid colon. The polyp was sessile. The       polyp was removed with a hot snare. Resection and retrieval were       complete.      A 8 mm polyp was found Judy the recto-sigmoid colon. The polyp was       sessile. The polyp was removed with a hot snare. Resection and retrieval       were complete.      A 19 mm polyp was found Judy the rectum. The polyp was pedunculated. The       polyp was removed with a hot snare. Resection and retrieval were       complete.      A few diverticula were found Judy the entire colon. There was no evidence       of diverticular bleeding.      Non-bleeding internal hemorrhoids were found during retroflexion. The       hemorrhoids were large.      The terminal ileum appeared normal. Impression:           - One 5 mm polyp Judy the ascending colon, removed with a                        cold snare. Resected and retrieved.                       - Two 7 mm polyps Judy the transverse colon, removed with                        a hot  snare. Resected and retrieved.                       - Five 5 to 7 mm polyps Judy the descending colon,                        removed with a hot snare. Resected and retrieved.                       - One 7 mm polyp Judy the sigmoid colon, removed with a                        hot snare. Resected and retrieved.                       - One 8 mm polyp at the recto-sigmoid colon, removed                        with a hot snare. Resected and retrieved.                       -  One 19 mm polyp Judy the rectum, removed with a hot                        snare. Resected and retrieved.                       - Moderate diverticulosis Judy the entire examined colon.                        There was no evidence of diverticular bleeding.                       - Non-bleeding internal hemorrhoids.                       - Yellow stool Judy colon, no fresh or old blood seen Recommendation:       - Return patient to hospital ward for ongoing care.                       - Full liquid diet today.                       - Continue present medications.                       - Await pathology results.                       - Repeat colonoscopy Judy 2 years for surveillance of                        multiple polyps.                       - Return to my office Judy 4 weeks. Procedure Code(s):    --- Professional ---                       [phone], Colonoscopy, flexible; with removal of tumor(s),  polyp(s), or other lesion(s) by snare technique Diagnosis Code(s):    --- Professional ---                       D12.2, Benign neoplasm of ascending colon                       D12.5, Benign neoplasm of sigmoid colon                       D12.7, Benign neoplasm of rectosigmoid junction                       K62.1, Rectal polyp                       D12.3, Benign neoplasm of transverse colon (hepatic                        flexure or splenic flexure)                       D12.4, Benign neoplasm of descending colon                       K64.8, Other hemorrhoids                       K92.1, Melena (includes Hematochezia)                       D62, Acute posthemorrhagic anemia CPT copyright 2017 American Medical Association. All rights reserved. The codes documented Judy this report are preliminary and upon coder review may  be revised to meet current compliance requirements. Dr. Ulyess Mort Lin Landsman MD, MD 12/30/2017 2:18:51 PM This report has been signed  electronically. Number of Addenda: 0 Note Initiated On: 12/30/2017 12:58 PM Scope Withdrawal Time: 0 hours 30 minutes 39 seconds  Total Procedure Duration: 0 hours 41 minutes 58 seconds       [facility]

## 2017-12-31 ENCOUNTER — Encounter
Admission: EM | Disposition: A | Discharge status: Home / Self Care | Payer: Self-pay | Source: Home / Self Care | Attending: Family Medicine

## 2017-12-31 DIAGNOSIS — K625 Hemorrhage of anus and rectum: Secondary | ICD-10-CM

## 2017-12-31 HISTORY — PX: FLEXIBLE SIGMOIDOSCOPY: SHX5431

## 2017-12-31 LAB — HEMOGLOBIN
Hemoglobin: 7.7 g/dL — ABNORMAL LOW (ref 12.0–16.0)
Hemoglobin: 9.7 g/dL — ABNORMAL LOW (ref 12.0–16.0)
Hemoglobin: 8.6 g/dL — ABNORMAL LOW (ref 12.0–16.0)
Hemoglobin: 8.5 g/dL — ABNORMAL LOW (ref 12.0–16.0)

## 2017-12-31 LAB — TYPE AND SCREEN
ABO/RH(D): AB POS
ANTIBODY SCREEN: NEGATIVE

## 2017-12-31 LAB — GLUCOSE, CAPILLARY
GLUCOSE-CAPILLARY: 104 mg/dL — AB (ref 65–99)
Glucose-Capillary: 115 mg/dL — ABNORMAL HIGH (ref 65–99)

## 2017-12-31 LAB — PREPARE RBC (CROSSMATCH)

## 2017-12-31 SURGERY — SIGMOIDOSCOPY, FLEXIBLE
Anesthesia: Moderate Sedation

## 2017-12-31 SURGERY — SIGMOIDOSCOPY, FLEXIBLE

## 2017-12-31 MED ORDER — SODIUM CHLORIDE 0.9 % IV BOLUS
1000.0000 mL | Freq: Once | INTRAVENOUS | Status: AC
  Administered 2017-12-31: 1000 mL via INTRAVENOUS

## 2017-12-31 MED ORDER — SODIUM CHLORIDE 0.9 % IV SOLN: Freq: Once | INTRAVENOUS | Status: DC

## 2017-12-31 NOTE — Progress Notes (Signed)
Judy Torres at [place] NAME: [name]    MR#:  [id number]  DATE OF BIRTH:  1946-06-12  SUBJECTIVE:  CHIEF COMPLAINT:   Chief Complaint  Patient presents with  . Rectal Bleeding  Patient without complaint, status post flexible sigmoidoscopy by gastroenterology this morning noted for rectal ulcers-status post clip  REVIEW OF SYSTEMS:  CONSTITUTIONAL: No fever, fatigue or weakness.  EYES: No blurred or double vision.  EARS, NOSE, AND THROAT: No tinnitus or ear pain.  RESPIRATORY: No cough, shortness of breath, wheezing or hemoptysis.  CARDIOVASCULAR: No chest pain, orthopnea, edema.  GASTROINTESTINAL: No nausea, vomiting, diarrhea or abdominal pain.  GENITOURINARY: No dysuria, hematuria.  ENDOCRINE: No polyuria, nocturia,  HEMATOLOGY: No anemia, easy bruising or bleeding SKIN: No rash or lesion. MUSCULOSKELETAL: No joint pain or arthritis.   NEUROLOGIC: No tingling, numbness, weakness.  PSYCHIATRY: No anxiety or depression.   ROS  DRUG ALLERGIES:   Allergies  Allergen Reactions  . Penicillins Other (See Comments)    Pass out  . Sulfa Antibiotics Nausea Only    VITALS:  Blood pressure 128/63, pulse (!) 52, temperature 98.1 F (36.7 C), temperature source Oral, resp. rate (!) 26, height 4\' 11"  (1.499 m), weight 45.8 kg (100 lb 15.5 oz), SpO2 100 %.  PHYSICAL EXAMINATION:  GENERAL:  72 y.o.-year-old patient lying in the bed with no acute distress.  EYES: Pupils equal, round, reactive to light and accommodation. No scleral icterus. Extraocular muscles intact.  HEENT: Head atraumatic, normocephalic. Oropharynx and nasopharynx clear.  NECK:  Supple, no jugular venous distention. No thyroid enlargement, no tenderness.  LUNGS: Normal breath sounds bilaterally, no wheezing, rales,rhonchi or crepitation. No use of accessory muscles of respiration.  CARDIOVASCULAR: S1, S2 normal. No murmurs, rubs, or gallops.  ABDOMEN: Soft,  nontender, nondistended. Bowel sounds present. No organomegaly or mass.  EXTREMITIES: No pedal edema, cyanosis, or clubbing.  NEUROLOGIC: Cranial nerves II through XII are intact. Muscle strength 5/5 in all extremities. Sensation intact. Gait not checked.  PSYCHIATRIC: The patient is alert and oriented x 3.  SKIN: No obvious rash, lesion, or ulcer.   Physical Exam LABORATORY PANEL:   CBC Recent Labs  Lab 12/30/17 0423  12/31/17 0439  WBC 7.5  --   --   HGB 9.5*   < > 7.7*  HCT 27.8*  --   --   PLT 131*  --   --    < > = values in this interval not displayed.   ------------------------------------------------------------------------------------------------------------------  Chemistries  Recent Labs  Lab 12/28/17 1928  12/30/17 0423  NA 137   < > 141  K 3.8   < > 3.1*  CL 104   < > 112*  CO2 23   < > 22  GLUCOSE 168*   < > 78  BUN 46*   < > 24*  CREATININE 1.10*   < > 0.92  CALCIUM 8.0*   < > 7.2*  MG  --   --  1.7  AST 28  --   --   ALT 12*  --   --   ALKPHOS 37*  --   --   BILITOT 0.4  --   --    < > = values in this interval not displayed.   ------------------------------------------------------------------------------------------------------------------  Cardiac Enzymes No results for input(s): TROPONINI in the last 168 hours. ------------------------------------------------------------------------------------------------------------------  RADIOLOGY:  Nm Gi Blood Loss  Result Date: 12/29/2017 CLINICAL DATA:  GI bleed beginning  12/28/2017 with some lower abdominal cramping. EXAM: NUCLEAR MEDICINE GASTROINTESTINAL BLEEDING SCAN TECHNIQUE: Sequential abdominal images were obtained following intravenous administration of Tc-72m labeled red blood cells up to 2 hours. RADIOPHARMACEUTICALS:  21.95 mCi Tc-11m pertechnetate in-vitro labeled red cells. COMPARISON:  CT 12/15/2011 FINDINGS: Examination demonstrates no evidence of GI bleed on the 1 hour or 2 hours sequential  images. 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 IMPRESSION: No evidence of GI bleed. Electronically Signed   By: Marin Olp M.D.   On: 12/29/2017 18:25    ASSESSMENT AND PLAN:  *Acute rectal bleeding Status post colonoscopy on December 30, 2017 by gastroenterology noted for diverticulosis/multiple polyps status post biopsies, status post flexible sigmoidoscopy on December 31, 2017 by gastroenterology-found rectal ulceration for which clip was placed, continue IV Protonix, continue to avoid antiplatelet/NSAIDs/anticoagulants, CBC daily, and transfuse as needed  *Acute symptomatic anemia due to acute blood loss  Improved status post red blood cell transfusions Plan of care as stated above  *Chronic lupus Stable Continue home regiment  *Chronic benign essential hypertension Stable  *Chronic GERD PPI as above  All the records are reviewed and case discussed with Care Management/Social Workerr. Management plans discussed with the patient, family and they are in agreement.  CODE STATUS: full  TOTAL TIME TAKING CARE OF THIS PATIENT: 35 minutes.     POSSIBLE D/C IN 3 DAYS, DEPENDING ON CLINICAL CONDITION.   Avel Peace Salary M.D on 12/31/2017   Between 7am to 6pm - Pager - [phone]  After 6pm go to [email or web address] - password [email or web address] [facility]  Office  [phone]  CC: Primary care physician; [name], MD  Note: This dictation was prepared with Dragon dictation along with smaller phrase technology. Any transcriptional errors that result from this process are unintentional.

## 2017-12-31 NOTE — Progress Notes (Signed)
Follow up - Critical Care Medicine Note  Patient Details:    [name] is an 72 y.o. female with a past medical history of Lupus, hypertension, gastroesophageal reflux disease, presented to the emergency department with multiple episodes of dark red blood per rectum, lower abdominal cramps, weakness. Upon arrival she initially had a hemoglobin of 8.3, was admitted to the [place] unit, had additional episode of dark blood per rectum,  Lines, Airways, Drains:    Anti-infectives:  Anti-infectives (From admission, onward)   Start     Dose/Rate Route Frequency Ordered Stop   12/29/17 1000  [MAR Hold]  hydroxychloroquine (PLAQUENIL) tablet 200 mg     (MAR Hold since Sat 12/31/2017 at 0858. Reason: Transfer to a Procedural area.)  Note to Pharmacy:  TAKE 1 TABLET BY MOUTH TWICE A DAY     200 mg Oral 2 times daily 12/29/17 0053        Microbiology: Results for orders placed or performed during the hospital encounter of 12/28/17  MRSA PCR Screening     Status: None   Collection Time: 12/29/17  6:39 PM  Result Value Ref Range Status   MRSA by PCR NEGATIVE NEGATIVE Final    Comment:        The GeneXpert MRSA Assay (FDA approved for NASAL specimens only), is one component of a comprehensive MRSA colonization surveillance program. It is not intended to diagnose MRSA infection nor to guide or monitor treatment for MRSA infections. Performed at [facility], [address] [place], [place] [address]     Events:   Studies: Nm Gi Blood Loss  Result Date: 12/29/2017 CLINICAL DATA:  GI bleed beginning 12/28/2017 with some lower abdominal cramping. EXAM: NUCLEAR MEDICINE GASTROINTESTINAL BLEEDING SCAN TECHNIQUE: Sequential abdominal images were obtained following intravenous administration of Tc-17m labeled red blood cells up to 2 hours. RADIOPHARMACEUTICALS:  21.95 mCi Tc-64m pertechnetate in-vitro labeled red cells. COMPARISON:  CT 12/15/2011 FINDINGS: Examination  demonstrates no evidence of GI bleed on the 1 hour or 2 hours sequential images. 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 IMPRESSION: No evidence of GI bleed. Electronically Signed   By: Marin Olp M.D.   On: 12/29/2017 18:25    Consults: Treatment Team:  Lin Landsman, MD Jonathon Bellows, MD   Subjective:    Overnight Issues: status post endoscopy yesterday which revealed colonic polyps, was transferred to the floor however developed another episode of acute lower gastrointestinal bleeding and was transferred back to the intensive care unit. She just returned from repeat endoscopy where clips were placed on probable recently bleeding polyp site. Please see GI note  Objective:  Vital signs for last 24 hours: Temp:  [97 F (36.1 C)-98.2 F (36.8 C)] 97.9 F (36.6 C) (06/15 0810) Pulse Rate:  [56-99] 87 (06/15 0810) Resp:  [17-36] 26 (06/15 0810) BP: (82-124)/(51-77) 121/65 (06/15 0810) SpO2:  [94 %-100 %] 100 % (06/15 0810) Weight:  [100 lb 15.5 oz (45.8 kg)] 100 lb 15.5 oz (45.8 kg) (06/15 0500)  Hemodynamic parameters for last 24 hours:    Intake/Output from previous day: 06/14 0701 - 06/15 0700 In: 776.7 [I.V.:376.7; IV Piggyback:300] Out: 300 [Urine:300]  Intake/Output this shift: Total I/O In: 250 [I.V.:250] Out: -   Vent settings for last 24 hours:    Physical Exam:  Patient is awake but sleepy this morning. Just arrived in the intensive care unit post episode of recurrent lower intestinal bleeding Vital signs: Please see the above listed vital signs  HEENT: Trachea is midline, no carotid bruits auscultated, no oral lesions noted, flat neck veins Cardiovascular: Regular rate and rhythm Pulmonary: Clear to auscultation Abdominal: Positive bowel sounds, soft nontender exam Extremity: No clubbing cyanosis or edema noted  Assessment/Plan:   Lower intestinal bleeding. Repeat endoscopy this morning, clips placed on prior polyp removal site. Pending  repeat CBC, will monitor closely intensive care unit and transfuse as needed, pending morning labs  Critical Care Total Time  35 minutes.   Judy Torres 12/31/2017  *Care during the described time interval was provided by me and/or other providers on the critical care team.  I have reviewed this patient's available data, including medical history, events of note, physical examination and test results as part of my evaluation.

## 2017-12-31 NOTE — Progress Notes (Signed)
Called to bathroom by pt, found weak clammy, lethargic, but responsive, active bleeding noted, assisted to w/c to assist to bed more active red bleeding noted, pt moaning ans weak. Rapid response called, assist given to pt, oxygen 2 l, Dr Marcille Blanco notified and Dr Vicente Males of status change, orders taken for transfer to step down, [facility] present and aware. Notified husband of transfer. Pt to transfer to stepdown

## 2017-12-31 NOTE — Progress Notes (Signed)
   12/31/17 0600  Clinical Encounter Type  Visited With Health care provider  Visit Type Initial;Critical Care  Referral From Physician;Care management;Palliative care team  Consult/Referral To Chaplain  Chaplain was PG to Med [facility] for Pt. RR and Chaplain arrived and Care Team was working with Pt. Chaplain made her Pastoral presence known.  PT was bleeding heavily and pass out. Chaplain received another PG that a another PT had declined so Chaplain informed Care Team and Care Team told Chaplain they would PG if needed me. Chaplain said ok, thank you. Pt arrived at ICU while Chaplain was still with other family. Chaplain stop by to make her Pastoral presence know to family, husband and son. Chaplain ask family was there anything she could do for them and family thank Chaplain for stopping by. Chaplain did say hello to Pt as well. PT was in and out. Chaplain told family to let her know if there was anything they needed. Chaplain told family to call.

## 2017-12-31 NOTE — H&P (Signed)
Jonathon Bellows, MD [address] [place], [place], [place], [address] [address], [place], [place], [place], [address] Phone: [phone]  Fax: [phone]  Primary Care Physician:  [name], MD   Pre-Procedure History & Physical: HPI:  In Arieliz Latino is a 72 y.o. female is here fora flexible sigmoidoscopy    Past Medical History:  Diagnosis Date  . GERD (gastroesophageal reflux disease)   . HTN (hypertension)   . Lupus [facility])     Past Surgical History:  Procedure Laterality Date  . CARDIAC SURGERY    . CESAREAN SECTION      Prior to Admission medications   Medication Sig Start Date End Date Taking? Authorizing Provider  alendronate (FOSAMAX) 70 MG tablet Take by mouth.   Yes [provider]  aspirin EC 81 MG tablet Take 81 mg by mouth. 05/21/13  Yes [provider]  ezetimibe-simvastatin (VYTORIN) 10-40 MG per tablet Take 1 tablet by mouth daily.   Yes [provider]  hydroxychloroquine (PLAQUENIL) 200 MG tablet TAKE 1 TABLET BY MOUTH TWICE A DAY 01/22/14  Yes [provider]  lisinopril (PRINIVIL,ZESTRIL) 10 MG tablet Take 10 mg by mouth daily.   Yes [provider]  predniSONE (DELTASONE) 5 MG tablet Take 5 mg by mouth. 12/12/13  Yes [provider]    Allergies as of 12/28/2017 - Review Complete 12/28/2017  Allergen Reaction Noted  . Penicillins Other (See Comments) 02/28/2014  . Sulfa antibiotics Nausea Only 02/28/2014    Family History  Problem Relation Age of Onset  . Hypertension Mother   . Stroke Mother   . Hypertension Sister     Social History   Socioeconomic History  . Marital status: Married    Spouse name: Not on file  . Number of children: Not on file  . Years of education: Not on file  . Highest education level: Not on file  Occupational History  . Not on file  Social Needs  . Financial resource strain: Not on file  . Food insecurity:    Worry: Not on file    Inability: Not on  file  . Transportation needs:    Medical: Not on file    Non-medical: Not on file  Tobacco Use  . Smoking status: Former Research scientist (life sciences)  . Smokeless tobacco: Never Used  Substance and Sexual Activity  . Alcohol use: Not Currently  . Drug use: Not Currently  . Sexual activity: Not on file  Lifestyle  . Physical activity:    Days per week: Not on file    Minutes per session: Not on file  . Stress: Not on file  Relationships  . Social connections:    Talks on phone: Not on file    Gets together: Not on file    Attends religious service: Not on file    Active member of club or organization: Not on file    Attends meetings of clubs or organizations: Not on file    Relationship status: Not on file  . Intimate partner violence:    Fear of current or ex partner: Not on file    Emotionally abused: Not on file    Physically abused: Not on file    Forced sexual activity: Not on file  Other Topics Concern  . Not on file  Social History Narrative  . Not on file    Review of Systems: See HPI, otherwise negative ROS  Physical Exam: BP 121/65   Pulse 87   Temp  97.9 F (36.6 C) (Oral)   Resp (!) 26   Ht 4\' 11"  (1.499 m)   Wt 100 lb 15.5 oz (45.8 kg)   SpO2 100%   BMI 20.39 kg/m  General:   Alert,  pleasant and cooperative in NAD Head:  Normocephalic and atraumatic. Neck:  Supple; no masses or thyromegaly. Lungs:  Clear throughout to auscultation, normal respiratory effort.    Heart:  +S1, +S2, Regular rate and rhythm, No edema. Abdomen:  Soft, nontender and nondistended. Normal bowel sounds, without guarding, and without rebound.   Neurologic:  Alert and  oriented x4;  grossly normal neurologically.  Impression/Plan: In Levana Minetti is here for a sigmoidoscopy to evalute for rectal bleeding   Risks, benefits, limitations, and alternatives regarding  colonoscopy have been reviewed with the patient.  Questions have been answered.  All parties agreeable.   Jonathon Bellows, MD   12/31/2017, 8:46 AM

## 2017-12-31 NOTE — OR Nursing (Signed)
Pt here for flexible sigmoidoscopy due to rectal bleeding.  No IV meds will be given during procedure.

## 2017-12-31 NOTE — Progress Notes (Signed)
Jonathon Bellows , MD [address], [place], [place], [place], [address] [address], [place], [place], [place], [address] Phone: [phone]  Fax: [phone]   In Ly Wass is being followed for hematochezia  Subjective: 2 episodes of rectal bleeding   Objective: Vital signs in last 24 hours: Vitals:   12/31/17 0447 12/31/17 0500 12/31/17 0600 12/31/17 0700  BP: 99/77 114/65 102/64 (!) 91/51  Pulse: 99 89 88 90  Resp:   19 (!) 27  Temp:  98.2 F (36.8 C)    TempSrc:  Oral    SpO2:  100% 94% 100%  Weight:  100 lb 15.5 oz (45.8 kg)    Height:       Weight change: 1 lb 1.6 oz (0.5 kg)  Intake/Output Summary (Last 24 hours) at 12/31/2017 0805 Last data filed at 12/30/2017 1715 Gross per 24 hour  Intake 776.66 ml  Output 300 ml  Net 476.66 ml     Exam: Heart:: Regular rate and rhythm, S1S2 present or without murmur or extra heart sounds Lungs: normal, clear to auscultation and clear to auscultation and percussion Abdomen: soft, nontender, normal bowel sounds   Lab Results: @LABTEST2 @ Micro Results: Recent Results (from the past 240 hour(s))  MRSA PCR Screening     Status: None   Collection Time: 12/29/17  6:39 PM  Result Value Ref Range Status   MRSA by PCR NEGATIVE NEGATIVE Final    Comment:        The GeneXpert MRSA Assay (FDA approved for NASAL specimens only), is one component of a comprehensive MRSA colonization surveillance program. It is not intended to diagnose MRSA infection nor to guide or monitor treatment for MRSA infections. Performed at [facility], [address] [place], [place] [address]    Studies/Results: Nm Gi Blood Loss  Result Date: 12/29/2017 CLINICAL DATA:  GI bleed beginning 12/28/2017 with some lower abdominal cramping. EXAM: NUCLEAR MEDICINE GASTROINTESTINAL BLEEDING SCAN TECHNIQUE: Sequential abdominal images were obtained following intravenous administration of Tc-55m labeled red blood cells up to 2 hours.  RADIOPHARMACEUTICALS:  21.95 mCi Tc-46m pertechnetate in-vitro labeled red cells. COMPARISON:  CT 12/15/2011 FINDINGS: Examination demonstrates no evidence of GI bleed on the 1 hour or 2 hours sequential images. 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 IMPRESSION: No evidence of GI bleed. Electronically Signed   By: Marin Olp M.D.   On: 12/29/2017 18:25   Medications: I have reviewed the patient's current medications. Scheduled Meds: . hydroxychloroquine  200 mg Oral BID  . pantoprazole  40 mg Oral Daily   Continuous Infusions: . sodium chloride     PRN Meds:.acetaminophen **OR** acetaminophen, morphine injection, ondansetron **OR** ondansetron (ZOFRAN) IV   Assessment: Principal Problem:   GI bleed Active Problems:   Lupus (HCC)   GERD (gastroesophageal reflux disease)   HTN (hypertension)  In Sun Hollingworth 72 y.o. female admitted with hematochezia . Tagged scan on admission negative,S/p EGD+colonoscopy by Dr Marius Ditch yesterday , multiple colon polyps resected .Few diverticula. I was called earlier this morning as patient was hypotensive and had a large bloody bowel movement . Differentials are likely a post polypectomy bleed vs diverticular. A large polyp was taken out in the rectum . Among the polyps resected , if one were to bleed it would like be the rectal polyp and since she has had some food to eat , will proceed with unsedated sigmoidoscopy to evaluate polypectomy site in rectum   Plan: 1. Flexible sigmoidoscopy today and will  clip the polypectomy site 2. Monitor CBC closely    LOS: 3 days   Jonathon Bellows, MD 12/31/2017, 8:05 AM

## 2017-12-31 NOTE — Progress Notes (Signed)
Pt has had ~3 bloody BMs since her procedure with Dr. Vicente Males this morning. Stool amount has been small each time, and it appears to be a mixture of bright red blood and black tarry stool. Pt states that she continues to feel better since receiving blood transfusion earlier today. She is currently sitting up in bed and reports feeling well Dr. Vicente Males notified of the above, no ne orders received.

## 2017-12-31 NOTE — Op Note (Signed)
[facility] Patient Name: Judy Torres Procedure Date: 12/31/2017 8:57 AM MRN: [id number] Account #: [id number] Date of Birth: [birth year] Admit Type: Inpatient Age: 72 Room: [facility] ENDO ROOM 4 Gender: Female Note Status: Finalized Procedure:            Flexible Sigmoidoscopy Indications:          Hematochezia Providers:            Jonathon Bellows MD, MD Referring MD:         No Local Md, MD (Referring MD) Medicines:            None Complications:        No immediate complications. Procedure:            Pre-Anesthesia Assessment:                       - Prior to the procedure, a History and Physical was                        performed, and patient medications, allergies and                        sensitivities were reviewed. The patient's tolerance of                        previous anesthesia was reviewed.                       - The risks and benefits of the procedure and the                        sedation options and risks were discussed with the                        patient. All questions were answered and informed                        consent was obtained.                       - ASA Grade Assessment: III - A patient with severe                        systemic disease.                       After obtaining informed consent, the scope was passed                        under direct vision. The Colonoscope was introduced                        through the anus and advanced to the the rectosigmoid                        junction. The flexible sigmoidoscopy was accomplished                        with ease. The patient tolerated the procedure well.  The quality of the bowel preparation was fair. Findings:      The perianal and digital rectal examinations were normal.      Red blood was found Judy the rectum and Judy the recto-sigmoid colon.      A single (solitary) eight mm ulcer was found Judy the rectum. No bleeding       was  present. No stigmata of recent bleeding were seen. To prevent       bleeding post-intervention, two hemostatic clips were successfully       placed. There was no bleeding during, or at the end, of the procedure. Impression:           - Preparation of the colon was fair.                       - Blood Judy the rectum and Judy the recto-sigmoid colon.                       - A single (solitary) ulcer Judy the rectum. Clips were                        placed.                       - No specimens collected. Recommendation:       - Return patient to hospital ward for ongoing care.                       - 1. Ihave clipped the polypectomy site for the large                        polyp excised Judy the rectum                       2. If has further bleeding then get tagged RBC scan                       3. Keep NPo for next 4-6 hours till we know for sure                        she is not having further bright red blood per rectum ,                        if none then can start on clears . Procedure Code(s):    --- Professional ---                       [phone], 52, Sigmoidoscopy, flexible; diagnostic,                        including collection of specimen(s) by brushing or                        washing, when performed (separate procedure) Diagnosis Code(s):    --- Professional ---                       K62.5, Hemorrhage of anus and rectum                       K92.2, Gastrointestinal hemorrhage,  unspecified                       K62.6, Ulcer of anus and rectum                       K92.1, Melena (includes Hematochezia) CPT copyright 2017 American Medical Association. All rights reserved. The codes documented Judy this report are preliminary and upon coder review may  be revised to meet current compliance requirements. Jonathon Bellows, MD Jonathon Bellows MD, MD 12/31/2017 9:11:28 AM This report has been signed electronically. Number of Addenda: 0 Note Initiated On: 12/31/2017 8:57 AM      [facility]

## 2017-12-31 NOTE — OR Nursing (Signed)
Procedure completed 2 clips used at polypectomy site from yesterdays colonoscopy.

## 2017-12-31 NOTE — Progress Notes (Signed)
Patient transferred from room 204 with bleeding.  Complaints of pain in arm and in abdomen.  Alert and oriented.  Husband at bedside.  GI aware and will be in to see patient.  Will await advise from GI.  Will continue to monitor.

## 2017-12-31 NOTE — Progress Notes (Signed)
Pt's most recent hbg resulted at 8.6. She denies CP or lightheadedness, but does endorse slight SOB. LS clear throughout lung fields on auscultation and her O2 sats are 96-100% on RA. Next hgb draw scheduled at 2300. Dr. Vicente Males and Dr. Jefferson Fuel notified. Per both, will continue to monitor.

## 2018-01-01 LAB — BPAM RBC
BLOOD PRODUCT EXPIRATION DATE: 201906212359
BLOOD PRODUCT EXPIRATION DATE: 201907092359
Blood Product Expiration Date: 201907112359
Blood Product Expiration Date: 201907112359
ISSUE DATE / TIME: 201906131159
ISSUE DATE / TIME: 201906150751
ISSUE DATE / TIME: 201906132103
UNIT TYPE AND RH: 6200
Unit Type and Rh: 6200
Unit Type and Rh: 6200
Unit Type and Rh: 6200

## 2018-01-01 LAB — COMPREHENSIVE METABOLIC PANEL
ALBUMIN: 2.6 g/dL — AB (ref 3.5–5.0)
ALK PHOS: 23 U/L — AB (ref 38–126)
ALT: 10 U/L — ABNORMAL LOW (ref 14–54)
ANION GAP: 5 (ref 5–15)
AST: 23 U/L (ref 15–41)
BILIRUBIN TOTAL: 0.8 mg/dL (ref 0.3–1.2)
BUN: 18 mg/dL (ref 6–20)
CO2: 20 mmol/L — AB (ref 22–32)
Calcium: 7.7 mg/dL — ABNORMAL LOW (ref 8.9–10.3)
Chloride: 114 mmol/L — ABNORMAL HIGH (ref 101–111)
Creatinine, Ser: 0.83 mg/dL (ref 0.44–1.00)
GFR calc Af Amer: >60 mL/min (ref >60)
GFR calc non Af Amer: >60 mL/min (ref >60)
GLUCOSE: 84 mg/dL (ref 65–99)
Potassium: 3.6 mmol/L (ref 3.5–5.1)
SODIUM: 139 mmol/L (ref 135–145)
TOTAL PROTEIN: 4.1 g/dL — AB (ref 6.5–8.1)

## 2018-01-01 LAB — TYPE AND SCREEN
ABO/RH(D): AB POS
Antibody Screen: NEGATIVE
UNIT DIVISION: 0
UNIT DIVISION: 0
Unit division: 0
Unit division: 0

## 2018-01-01 LAB — MAGNESIUM: Magnesium: 1.7 mg/dL (ref 1.7–2.4)

## 2018-01-01 LAB — CBC
HCT: 22.6 % — ABNORMAL LOW (ref 35.0–47.0)
Hemoglobin: 7.9 g/dL — ABNORMAL LOW (ref 12.0–16.0)
MCH: 31.7 pg (ref 26.0–34.0)
MCHC: 35 g/dL (ref 32.0–36.0)
MCV: 90.6 fL (ref 80.0–100.0)
Platelets: 106 10*3/uL — ABNORMAL LOW (ref 150–440)
RBC: 2.5 MIL/uL — ABNORMAL LOW (ref 3.80–5.20)
RDW: 13.9 % (ref 11.5–14.5)
WBC: 6.5 10*3/uL (ref 3.6–11.0)

## 2018-01-01 LAB — HEMOGLOBIN AND HEMATOCRIT, BLOOD
HCT: 22.7 % — ABNORMAL LOW (ref 35.0–47.0)
Hemoglobin: 8.1 g/dL — ABNORMAL LOW (ref 12.0–16.0)

## 2018-01-01 LAB — PHOSPHORUS: Phosphorus: 3.4 mg/dL (ref 2.5–4.6)

## 2018-01-01 NOTE — Progress Notes (Signed)
Pt has remained alert and oriented with no c/o pain. Pt has remained on RA, lung sounds clear to auscultation, NDN. Pt has remained in NSR/BBB, BP/HR WNL. Pt has had 2 small stools this am black-brown with red-streaks. Hbg 7.9 this am-recheck 1600. Husband has been updated and remained at bedside. Pt with orders to transfer to the floor off tele.

## 2018-01-01 NOTE — Progress Notes (Signed)
Judy Torres , MD [address], [place], [place], [place], [address] [address], [place], [place], [place], [address] Phone: [phone]  Fax: [phone]   [name] is being followed for hematochezia   Subjective: Few streaks of blood in stool- decreasing   Objective: Vital signs in last 24 hours: Vitals:   01/01/18 0500 01/01/18 0600 01/01/18 0700 01/01/18 0800  BP: (!) 82/60 (!) 108/42 (!) 112/59 93/62  Pulse: 90 86 69 98  Resp: (!) 21 (!) 29 (!) 21   Temp:    98.6 F (37 C)  TempSrc:    Oral  SpO2: 99% 99% 96% 100%  Weight:      Height:       Weight change:   Intake/Output Summary (Last 24 hours) at 01/01/2018 2706 Last data filed at 12/31/2017 2100 Gross per 24 hour  Intake 862 ml  Output 350 ml  Net 512 ml     Exam: Heart:: Regular rate and rhythm, S1S2 present or without murmur or extra heart sounds Lungs: normal, clear to auscultation and clear to auscultation and percussion Abdomen: soft, nontender, normal bowel sounds   Lab Results: @LABTEST2 @ Micro Results: Recent Results (from the past 240 hour(s))  MRSA PCR Screening     Status: None   Collection Time: 12/29/17  6:39 PM  Result Value Ref Range Status   MRSA by PCR NEGATIVE NEGATIVE Final    Comment:        The GeneXpert MRSA Assay (FDA approved for NASAL specimens only), is one component of a comprehensive MRSA colonization surveillance program. It is not intended to diagnose MRSA infection nor to guide or monitor treatment for MRSA infections. Performed at [facility], [address] [place], [place] [address]    Studies/Results: No results found. Medications: I have reviewed the patient's current medications. Scheduled Meds: . hydroxychloroquine  200 mg Oral BID  . pantoprazole  40 mg Oral Daily   Continuous Infusions: . sodium chloride     PRN Meds:.acetaminophen **OR** acetaminophen, morphine injection, ondansetron **OR** ondansetron (ZOFRAN)  IV  CBC Latest Ref Rng & Units 01/01/2018 12/31/2017 12/31/2017  WBC 3.6 - 11.0 K/uL 6.5 - -  Hemoglobin 12.0 - 16.0 g/dL 7.9(L) 8.5(L) 8.6(L)  Hematocrit 35.0 - 47.0 % 22.6(L) - -  Platelets 150 - 440 K/uL 106(L) - -    Assessment: Principal Problem:   GI bleed Active Problems:   Lupus (HCC)   GERD (gastroesophageal reflux disease)   HTN (hypertension)  [name] 71 y.o. female admitted with hematochezia . Tagged scan on admission negative,S/p EGD+colonoscopy by Dr Marius Ditch day before yesterday , multiple colon polyps resected .Few diverticula. The next day she had a  large bloody bowel movement . Differentials are likely a post polypectomy bleed vs diverticular. A large polyp was taken out in the rectum .sigmoidoscopy yesterday limited to evaluation of the rectum showed fresh red blood and 2 clips were placed at the polypectomy site in the rectum where a large polyp was taken out .    Plan: 1. Monitor CBC- if stable and has no further bleed then no further inpatient evaluation , can then see Dr Marius Ditch as an outpatient and discuss if capsule study is warranted.   2. If Hb continues to drop then may need a repeat colonoscopy to check if she is still bleeding from the other polypectomy sites which have not been evaluated on sigmoidoscopy yesterday . Dr Bonna Gains will follow from tomorrow.  LOS: 4 days   Judy Bellows, MD 01/01/2018, 9:16 AM

## 2018-01-01 NOTE — Progress Notes (Signed)
Follow up - Critical Care Medicine Note  Patient Details:    [name] is an 72 y.o. female with a past medical history of Lupus, hypertension, gastroesophageal reflux disease, presented to the emergency department with multiple episodes of dark red blood per rectum, lower abdominal cramps, weakness. Upon arrival she initially had a hemoglobin of 8.3, was admitted to the [place] unit, had additional episode of dark blood per rectum,  Lines, Airways, Drains:    Anti-infectives:  Anti-infectives (From admission, onward)   Start     Dose/Rate Route Frequency Ordered Stop   12/29/17 1000  hydroxychloroquine (PLAQUENIL) tablet 200 mg    Note to Pharmacy:  TAKE 1 TABLET BY MOUTH TWICE A DAY     200 mg Oral 2 times daily 12/29/17 0053        Microbiology: Results for orders placed or performed during the hospital encounter of 12/28/17  MRSA PCR Screening     Status: None   Collection Time: 12/29/17  6:39 PM  Result Value Ref Range Status   MRSA by PCR NEGATIVE NEGATIVE Final    Comment:        The GeneXpert MRSA Assay (FDA approved for NASAL specimens only), is one component of a comprehensive MRSA colonization surveillance program. It is not intended to diagnose MRSA infection nor to guide or monitor treatment for MRSA infections. Performed at [facility], [address] [place], [place] [address]     Events:   Studies: Nm Gi Blood Loss  Result Date: 12/29/2017 CLINICAL DATA:  GI bleed beginning 12/28/2017 with some lower abdominal cramping. EXAM: NUCLEAR MEDICINE GASTROINTESTINAL BLEEDING SCAN TECHNIQUE: Sequential abdominal images were obtained following intravenous administration of Tc-37m labeled red blood cells up to 2 hours. RADIOPHARMACEUTICALS:  21.95 mCi Tc-90m pertechnetate in-vitro labeled red cells. COMPARISON:  CT 12/15/2011 FINDINGS: Examination demonstrates no evidence of GI bleed on the 1 hour or 2 hours sequential images. There is evidence of  patient's fem-fem bypass graft. IMPRESSION: No evidence of GI bleed. Electronically Signed   By: Marin Olp M.D.   On: 12/29/2017 18:25    Consults: Treatment Team:  Lin Landsman, MD Jonathon Bellows, MD   Subjective:    Overnight Issues: status post flexible sigmoidoscopy that revealed a single 8 mm ulcer in the rectum.  2 hemostatic clips were placed.  Patient had bloody stools postprocedure but overnight she had no further bloody stools.  Her hemoglobin is 7.9 this morning down from 8.5.  Her vital signs are stable and she reports feeling better Objective:  Vital signs for last 24 hours: Temp:  [97.9 F (36.6 C)-100.7 F (38.2 C)] 100.7 F (38.2 C) (06/16 0200) Pulse Rate:  [52-160] 86 (06/16 0600) Resp:  [19-36] 29 (06/16 0600) BP: (82-193)/(42-170) 108/42 (06/16 0600) SpO2:  [97 %-100 %] 99 % (06/16 0600)  Hemodynamic parameters for last 24 hours:    Intake/Output from previous day: 06/15 0701 - 06/16 0700 In: 1112 [P.O.:540; I.V.:250; Blood:322] Out: 350 [Urine:350]  Intake/Output this shift: No intake/output data recorded.  Vent settings for last 24 hours:    Physical Exam:  Constitutional: Awake, pleasant, in no acute distress Vital signs: Please see the above listed vital signs HEENT: Trachea is midline, no carotid bruits auscultated, no oral lesions noted, flat neck veins Cardiovascular: Regular rate and rhythm Pulmonary: Clear to auscultation Abdominal: Positive bowel sounds, soft nontender exam Extremity: No clubbing cyanosis or edema noted  Assessment/Plan:   Lower intestinal bleeding: No further bleeding status  post flexible sigmoidoscopy.  Continue treatment plan per GI. Patient is stable for transfer out of the ICU  Zyler Hyson S. [facility] ANP-BC Pulmonary and Critical Care Medicine [facility] Pager [phone] or [phone]  NB: This document was prepared using Dragon voice recognition software and may include unintentional dictation  errors.   01/01/2018

## 2018-01-01 NOTE — Progress Notes (Signed)
Patient had one large BM this shift that was dark with bright red streaks. Hgb down to 7.9. Patient has been asleep most of the shift and asymptomatic. No  C/o pain or discomfort. Continue to monitor.

## 2018-01-01 NOTE — Anesthesia Postprocedure Evaluation (Signed)
Anesthesia Post Note  Patient: Judy Torres  Procedure(s) Performed: ESOPHAGOGASTRODUODENOSCOPY (EGD) (N/A ) COLONOSCOPY (N/A )  Patient location during evaluation: PACU Anesthesia Type: General Level of consciousness: awake and alert Pain management: pain level controlled Vital Signs Assessment: post-procedure vital signs reviewed and stable Respiratory status: spontaneous breathing, nonlabored ventilation, respiratory function stable and patient connected to nasal cannula oxygen Cardiovascular status: stable and blood pressure returned to baseline Postop Assessment: no apparent nausea or vomiting Anesthetic complications: no     Last Vitals:  Vitals:   01/01/18 0700 01/01/18 0800  BP: (!) 112/59 93/62  Pulse: 69 98  Resp: (!) 21   Temp:  37 C  SpO2: 96% 100%    Last Pain:  Vitals:   01/01/18 0800  TempSrc: Oral  PainSc: 0-No pain                 Molli Barrows

## 2018-01-01 NOTE — Progress Notes (Signed)
[facility] [place] NAME: [name]    MR#:  [id number]  DATE OF BIRTH:  11-Nov-1945  SUBJECTIVE:  CHIEF COMPLAINT:   Chief Complaint  Patient presents with  . Rectal Bleeding  Husband at the bedside, patient without complaint, no further rectal bleeding noted, discussed with intensivist, possible transfer to the floor later today  REVIEW OF SYSTEMS:  CONSTITUTIONAL: No fever, fatigue or weakness.  EYES: No blurred or double vision.  EARS, NOSE, AND THROAT: No tinnitus or ear pain.  RESPIRATORY: No cough, shortness of breath, wheezing or hemoptysis.  CARDIOVASCULAR: No chest pain, orthopnea, edema.  GASTROINTESTINAL: No nausea, vomiting, diarrhea or abdominal pain.  GENITOURINARY: No dysuria, hematuria.  ENDOCRINE: No polyuria, nocturia,  HEMATOLOGY: No anemia, easy bruising or bleeding SKIN: No rash or lesion. MUSCULOSKELETAL: No joint pain or arthritis.   NEUROLOGIC: No tingling, numbness, weakness.  PSYCHIATRY: No anxiety or depression.   ROS  DRUG ALLERGIES:   Allergies  Allergen Reactions  . Penicillins Other (See Comments)    Pass out  . Sulfa Antibiotics Nausea Only    VITALS:  Blood pressure (!) 144/72, pulse (!) 102, temperature 98.6 F (37 C), temperature source Oral, resp. rate (!) 21, height 4\' 11"  (1.499 m), weight 45.8 kg (100 lb 15.5 oz), SpO2 95 %.  PHYSICAL EXAMINATION:  GENERAL:  72 y.o.-year-old patient lying in the bed with no acute distress.  EYES: Pupils equal, round, reactive to light and accommodation. No scleral icterus. Extraocular muscles intact.  HEENT: Head atraumatic, normocephalic. Oropharynx and nasopharynx clear.  NECK:  Supple, no jugular venous distention. No thyroid enlargement, no tenderness.  LUNGS: Normal breath sounds bilaterally, no wheezing, rales,rhonchi or crepitation. No use of accessory muscles of respiration.  CARDIOVASCULAR: S1, S2 normal. No murmurs, rubs, or gallops.   ABDOMEN: Soft, nontender, nondistended. Bowel sounds present. No organomegaly or mass.  EXTREMITIES: No pedal edema, cyanosis, or clubbing.  NEUROLOGIC: Cranial nerves II through XII are intact. Muscle strength 5/5 in all extremities. Sensation intact. Gait not checked.  PSYCHIATRIC: The patient is alert and oriented x 3.  SKIN: No obvious rash, lesion, or ulcer.   Physical Exam LABORATORY PANEL:   CBC Recent Labs  Lab 01/01/18 0505  WBC 6.5  HGB 7.9*  HCT 22.6*  PLT 106*   ------------------------------------------------------------------------------------------------------------------  Chemistries  Recent Labs  Lab 01/01/18 0505  NA 139  K 3.6  CL 114*  CO2 20*  GLUCOSE 84  BUN 18  CREATININE 0.83  CALCIUM 7.7*  MG 1.7  AST 23  ALT 10*  ALKPHOS 23*  BILITOT 0.8   ------------------------------------------------------------------------------------------------------------------  Cardiac Enzymes No results for input(s): TROPONINI in the last 168 hours. ------------------------------------------------------------------------------------------------------------------  RADIOLOGY:  No results found.  ASSESSMENT AND PLAN:  *Acute rectal bleeding Stable Status post colonoscopy on December 30, 2017 by gastroenterology noted for diverticulosis/multiple polyps status post biopsies, status post flexible sigmoidoscopy on December 31, 2017 by gastroenterology-found rectal ulceration for which clip was placed, continue IV Protonix, continue toavoid antiplatelet/NSAIDs/anticoagulants, CBC daily, transfuse as needed, possible transfer to the floor later today in discussion with intensivist  *Acute symptomatic anemia due to acuteblood loss  Improved status post red blood cell transfusions Plan of care as stated above  *Chronic lupus Stable Continue home regiment  *Chronic benign essential hypertension Stable  *Chronic GERD PPI as above  All the records are reviewed  and case discussed with Care Management/Social Workerr. Management plans discussed with the patient, family  and they are in agreement.  CODE STATUS: full  TOTAL TIME TAKING CARE OF THIS PATIENT: 35 minutes.     POSSIBLE D/C IN 1-2 DAYS, DEPENDING ON CLINICAL CONDITION.   Avel Peace Corderro Koloski M.D on 01/01/2018   Between 7am to 6pm - Pager - [phone]  After 6pm go to [email or web address] - password [email or web address] [facility]  Office  [phone]  CC: Primary care physician; [name], MD  Note: This dictation was prepared with Dragon dictation along with smaller phrase technology. Any transcriptional errors that result from this process are unintentional.

## 2018-01-02 ENCOUNTER — Inpatient Hospital Stay: Payer: Medicare Other

## 2018-01-02 MED ORDER — ADULT MULTIVITAMIN W/MINERALS CH: 1.0000 | ORAL_TABLET | Freq: Every day | ORAL | 0 refills | Status: DC

## 2018-01-02 MED ORDER — PANTOPRAZOLE SODIUM 40 MG PO TBEC: 40.0000 mg | DELAYED_RELEASE_TABLET | Freq: Every day | ORAL | 0 refills | Status: DC

## 2018-01-02 MED ORDER — ENSURE ENLIVE PO LIQD
237.0000 mL | Freq: Two times a day (BID) | ORAL | Status: DC
  Administered 2018-01-02 (×2): 237 mL via ORAL

## 2018-01-02 MED ORDER — ADULT MULTIVITAMIN W/MINERALS CH
1.0000 | ORAL_TABLET | Freq: Every day | ORAL | Status: DC
  Administered 2018-01-02: 10:00:00 1 via ORAL
  Filled 2018-01-02: qty 1

## 2018-01-02 MED ORDER — ENSURE ENLIVE PO LIQD: 237.0000 mL | Freq: Two times a day (BID) | ORAL | 0 refills | Status: DC

## 2018-01-02 NOTE — Progress Notes (Signed)
Pt A&Ox4. Discharge instructions given to patient and husband. Patient and husband stated understanding and denied questions. IVs removed. 3 prescriptions given to patient. Husband to transport patient home. Will continue to monitor until discharge.  Kyla Balzarine, LPN

## 2018-01-02 NOTE — Discharge Summary (Signed)
[facility] [place] NAME: [name]    MR#:  [id number]  DATE OF BIRTH:  02/23/1946  DATE OF ADMISSION:  12/28/2017 ADMITTING PHYSICIAN: Lance Coon, MD  DATE OF DISCHARGE: No discharge date for patient encounter.  PRIMARY CARE PHYSICIAN: [name], MD    ADMISSION DIAGNOSIS:  Gastrointestinal hemorrhage, unspecified gastrointestinal hemorrhage type [K92.2]  DISCHARGE DIAGNOSIS:  Principal Problem:   GI bleed Active Problems:   Lupus (HCC)   GERD (gastroesophageal reflux disease)   HTN (hypertension)   SECONDARY DIAGNOSIS:   Past Medical History:  Diagnosis Date  . GERD (gastroesophageal reflux disease)   . HTN (hypertension)   . Lupus [facility])     HOSPITAL COURSE:  *Acute rectal bleeding Stable Status post colonoscopy on December 30, 2017 by gastroenterology noted for diverticulosis/multiple polyps status post biopsies, status post flexible sigmoidoscopy on December 31, 2017 by gastroenterology-found rectal ulceration for which clip was placed, placed on PPI, avoided  antiplatelet/NSAIDs/anticoagulants, did receive blood transfusions while in house, did require short stay in the ICU, and patient did well  *Acutesymptomaticanemia due to acuteblood loss  Stable status post red blood cell transfusions while in house  *Chronic lupus Stable Continued home regiment  *Chronic benign essential hypertension Stable  *Chronic GERD PPI as above   DISCHARGE CONDITIONS:   Stable.  Patient demanded to go home on day of discharge  CONSULTS OBTAINED:  Treatment Team:  Lin Landsman, MD Jonathon Bellows, MD  DRUG ALLERGIES:   Allergies  Allergen Reactions  . Penicillins Other (See Comments)    Pass out  . Sulfa Antibiotics Nausea Only    DISCHARGE MEDICATIONS:   Allergies as of 01/02/2018      Reactions   Penicillins Other (See Comments)   Pass out   Sulfa Antibiotics Nausea Only       Medication List    STOP taking these medications   aspirin EC 81 MG tablet     TAKE these medications   alendronate 70 MG tablet Commonly known as:  FOSAMAX Take by mouth.   ezetimibe-simvastatin 10-40 MG tablet Commonly known as:  VYTORIN Take 1 tablet by mouth daily.   feeding supplement (ENSURE ENLIVE) Liqd Take 237 mLs by mouth 2 (two) times daily between meals.   hydroxychloroquine 200 MG tablet Commonly known as:  PLAQUENIL TAKE 1 TABLET BY MOUTH TWICE A DAY   lisinopril 10 MG tablet Commonly known as:  PRINIVIL,ZESTRIL Take 10 mg by mouth daily.   multivitamin with minerals Tabs tablet Take 1 tablet by mouth daily. Start taking on:  01/03/2018   pantoprazole 40 MG tablet Commonly known as:  PROTONIX Take 1 tablet (40 mg total) by mouth daily. Start taking on:  01/03/2018   predniSONE 5 MG tablet Commonly known as:  DELTASONE Take 5 mg by mouth.        DISCHARGE INSTRUCTIONS:   If you experience worsening of your admission symptoms, develop shortness of breath, life threatening emergency, suicidal or homicidal thoughts you must seek medical attention immediately by calling 911 or calling your MD immediately  if symptoms less severe.  You Must read complete instructions/literature along with all the possible adverse reactions/side effects for all the Medicines you take and that have been prescribed to you. Take any new Medicines after you have completely understood and accept all the possible adverse reactions/side effects.   Please note  You were cared for by a hospitalist during your hospital stay.  If you have any questions about your discharge medications or the care you received while you were in the hospital after you are discharged, you can call the unit and asked to speak with the hospitalist on call if the hospitalist that took care of you is not available. Once you are discharged, your primary care physician will handle any further medical issues.  Please note that NO REFILLS for any discharge medications will be authorized once you are discharged, as it is imperative that you return to your primary care physician (or establish a relationship with a primary care physician if you do not have one) for your aftercare needs so that they can reassess your need for medications and monitor your lab values.    Today   CHIEF COMPLAINT:   Chief Complaint  Patient presents with  . Rectal Bleeding    HISTORY OF PRESENT ILLNESS:  72 y.o. female who presents with several episodes of moderate volume rectal bleeding over the last 24 hours.  Patient is guaiac positive here with bloody bowel movement witnessed in the ED.  Her hemoglobin has fallen significantly since last value in our system which was back in February.  Hospitalist were called for admission and further evaluation  VITAL SIGNS:  Blood pressure 107/64, pulse 72, temperature 99 F (37.2 C), temperature source Oral, resp. rate 18, height 4\' 11"  (1.499 m), weight 45.8 kg (100 lb 15.5 oz), SpO2 100 %.  I/O:    Intake/Output Summary (Last 24 hours) at 01/02/2018 1006 Last data filed at 01/01/2018 1803 Gross per 24 hour  Intake 240 ml  Output -  Net 240 ml    PHYSICAL EXAMINATION:  GENERAL:  72 y.o.-year-old patient lying in the bed with no acute distress.  EYES: Pupils equal, round, reactive to light and accommodation. No scleral icterus. Extraocular muscles intact.  HEENT: Head atraumatic, normocephalic. Oropharynx and nasopharynx clear.  NECK:  Supple, no jugular venous distention. No thyroid enlargement, no tenderness.  LUNGS: Normal breath sounds bilaterally, no wheezing, rales,rhonchi or crepitation. No use of accessory muscles of respiration.  CARDIOVASCULAR: S1, S2 normal. No murmurs, rubs, or gallops.  ABDOMEN: Soft, non-tender, non-distended. Bowel sounds present. No organomegaly or mass.  EXTREMITIES: No pedal edema, cyanosis, or clubbing.  NEUROLOGIC: Cranial nerves II  through XII are intact. Muscle strength 5/5 in all extremities. Sensation intact. Gait not checked.  PSYCHIATRIC: The patient is alert and oriented x 3.  SKIN: No obvious rash, lesion, or ulcer.   DATA REVIEW:   CBC Recent Labs  Lab 01/01/18 0505 01/01/18 1747  WBC 6.5  --   HGB 7.9* 8.1*  HCT 22.6* 22.7*  PLT 106*  --     Chemistries  Recent Labs  Lab 01/01/18 0505  NA 139  K 3.6  CL 114*  CO2 20*  GLUCOSE 84  BUN 18  CREATININE 0.83  CALCIUM 7.7*  MG 1.7  AST 23  ALT 10*  ALKPHOS 23*  BILITOT 0.8    Cardiac Enzymes No results for input(s): TROPONINI in the last 168 hours.  Microbiology Results  Results for orders placed or performed during the hospital encounter of 12/28/17  MRSA PCR Screening     Status: None   Collection Time: 12/29/17  6:39 PM  Result Value Ref Range Status   MRSA by PCR NEGATIVE NEGATIVE Final    Comment:        The GeneXpert MRSA Assay (FDA approved for NASAL specimens only), is one component of a comprehensive MRSA  colonization surveillance program. It is not intended to diagnose MRSA infection nor to guide or monitor treatment for MRSA infections. Performed at [facility], [address] [place], [place] [address]     RADIOLOGY:  Dg Chest 2 View  Result Date: 01/02/2018 CLINICAL DATA:  New onset of fever, history of hypertension, former smoker, lupus. EXAM: CHEST - 2 VIEW COMPARISON:  Chest x-ray of January 15, 2011 FINDINGS: The lungs are adequately inflated. There is no focal infiltrate. There is no pleural effusion. The heart and pulmonary vascularity are normal. There is new abnormal soft tissue density in the lower right paratracheal region. The surgical wires are intact from median sternotomy. There is calcification in the wall of the descending thoracic aorta. The observed bony thorax is unremarkable. IMPRESSION: No evidence of pneumonia nor CHF. New abnormal soft tissue density in the lower right paratracheal  region is worrisome for lymphadenopathy or other mass. Chest CT scanning is recommended. Electronically Signed   By: David  Martinique M.D.   On: 01/02/2018 08:14    EKG:  No orders found for this or any previous visit.    Management plans discussed with the patient, family and they are in agreement.  CODE STATUS:     Code Status Orders  (From admission, onward)        Start     Ordered   12/29/17 0054  Full code  Continuous     12/29/17 0053    Code Status History    This patient has a current code status but no historical code status.      TOTAL TIME TAKING CARE OF THIS PATIENT: 45 minutes.    Judy Torres M.D on 01/02/2018 at 10:06 AM  Between 7am to 6pm - Pager - [phone]  After 6pm go to [email or web address] - password [email or web address] [facility]  Office  [phone]  CC: Primary care physician; [name], MD   Note: This dictation was prepared with Dragon dictation along with smaller phrase technology. Any transcriptional errors that result from this process are unintentional.

## 2018-01-02 NOTE — Care Management Important Message (Signed)
Important Message  Patient Details  Name: Judy Torres MRN: [id number] Date of Birth: 1945-11-15   Medicare Important Message Given:  Yes    Juliann Pulse A Soren Pigman 01/02/2018, 10:41 AM

## 2018-01-02 NOTE — Progress Notes (Signed)
[name], MD [address] [place], [place], [place], [address] [address], [place], [place], [place], [address] Phone: [phone]  Fax: [phone]   Subjective: Patient denies any further blood per rectum.  No abdominal pain.  No nausea or vomiting.  Tolerating diet.  Hemoglobin stable.   Objective: Exam: Vital signs in last 24 hours: Vitals:   01/01/18 1124 01/01/18 1357 01/01/18 1944 01/02/18 0452  BP: (!) 113/58 (!) 101/56 (!) 102/58 107/64  Pulse: 75 81 69 72  Resp: 18 18 18 18   Temp: 98.4 F (36.9 C) 98.3 F (36.8 C) (!) 100.5 F (38.1 C) 99 F (37.2 C)  TempSrc: Oral Oral Oral Oral  SpO2: 100%  100% 100%  Weight:      Height:       Weight change:   Intake/Output Summary (Last 24 hours) at 01/02/2018 2637 Last data filed at 01/01/2018 1803 Gross per 24 hour  Intake 840 ml  Output -  Net 840 ml    General: No acute distress, AAO x3 Abd: Soft, NT/ND, No HSM Skin: Warm, no rashes Neck: Supple, Trachea midline   Lab Results: Lab Results  Component Value Date   WBC 6.5 01/01/2018   HGB 8.1 (L) 01/01/2018   HCT 22.7 (L) 01/01/2018   MCV 90.6 01/01/2018   PLT 106 (L) 01/01/2018   Micro Results: Recent Results (from the past 240 hour(s))  MRSA PCR Screening     Status: None   Collection Time: 12/29/17  6:39 PM  Result Value Ref Range Status   MRSA by PCR NEGATIVE NEGATIVE Final    Comment:        The GeneXpert MRSA Assay (FDA approved for NASAL specimens only), is one component of a comprehensive MRSA colonization surveillance program. It is not intended to diagnose MRSA infection nor to guide or monitor treatment for MRSA infections. Performed at [facility], [address] [place], [place] [address]    Studies/Results: Dg Chest 2 View  Result Date: 01/02/2018 CLINICAL DATA:  New onset of fever, history of hypertension, former smoker, lupus. EXAM: CHEST - 2 VIEW COMPARISON:  Chest x-ray of January 15, 2011  FINDINGS: The lungs are adequately inflated. There is no focal infiltrate. There is no pleural effusion. The heart and pulmonary vascularity are normal. There is new abnormal soft tissue density in the lower right paratracheal region. The surgical wires are intact from median sternotomy. There is calcification in the wall of the descending thoracic aorta. The observed bony thorax is unremarkable. IMPRESSION: No evidence of pneumonia nor CHF. New abnormal soft tissue density in the lower right paratracheal region is worrisome for lymphadenopathy or other mass. Chest CT scanning is recommended. Electronically Signed   By: David  Martinique M.D.   On: 01/02/2018 08:14   Medications:  Scheduled Meds: . feeding supplement (ENSURE ENLIVE)  237 mL Oral BID BM  . hydroxychloroquine  200 mg Oral BID  . multivitamin with minerals  1 tablet Oral Daily  . pantoprazole  40 mg Oral Daily   Continuous Infusions: . sodium chloride     PRN Meds:.acetaminophen **OR** acetaminophen, morphine injection, ondansetron **OR** ondansetron (ZOFRAN) IV   Assessment: Principal Problem:   GI bleed Active Problems:   Lupus (HCC)   GERD (gastroesophageal reflux disease)   HTN (hypertension)    Plan: See detailed procedure notes by Dr. Marius Ditch and Dr. Vicente Males No further active GI bleeding.  Hemoglobin stable Continue serial CBCs and transfuse PRN Avoid NSAIDs Polypectomy site  in the rectum was clipped by Dr. Vicente Males on June 15, and patient has had improvement in symptoms, no further hematochezia today.  Describes melena which is yellow in color today. Patient should follow-up closely with Dr. Marius Ditch as an outpatient upon discharge.  Please call our clinic at [phone] prior to the hospital discharge, to make an appointment with Dr. Marius Ditch    LOS: 5 days   [name], MD 01/02/2018, 9:17 AM

## 2018-01-02 NOTE — Plan of Care (Signed)
  Problem: Education: Goal: Knowledge of General Education information will improve Outcome: Progressing   Problem: Health Behavior/Discharge Planning: Goal: Ability to manage health-related needs will improve Outcome: Progressing   Problem: Clinical Measurements: Goal: Will remain free from infection Outcome: Progressing   Problem: Activity: Goal: Risk for activity intolerance will decrease Outcome: Progressing   Problem: Nutrition: Goal: Adequate nutrition will be maintained Outcome: Progressing Note:  Pt on full liquid diet   Problem: Elimination: Goal: Will not experience complications related to urinary retention Outcome: Progressing   Problem: Pain Managment: Goal: General experience of comfort will improve Outcome: Progressing Note:  Tylenol given x1 for headache. Improvement noted    Problem: Safety: Goal: Ability to remain free from injury will improve Outcome: Progressing   Problem: Skin Integrity: Goal: Risk for impaired skin integrity will decrease Outcome: Progressing   Problem: Bowel/Gastric: Goal: Will show no signs and symptoms of gastrointestinal bleeding Outcome: Progressing Note:  No evidence of bleeding

## 2018-01-03 ENCOUNTER — Encounter: Payer: Self-pay | Admitting: Gastroenterology

## 2018-01-03 LAB — SURGICAL PATHOLOGY

## 2018-01-04 ENCOUNTER — Encounter: Payer: Self-pay | Admitting: Gastroenterology

## 2018-01-07 LAB — CULTURE, BLOOD (ROUTINE X 2)
CULTURE: NO GROWTH
Culture: NO GROWTH
SPECIAL REQUESTS: ADEQUATE
Special Requests: ADEQUATE

## 2018-02-03 ENCOUNTER — Ambulatory Visit: Payer: Medicare Other | Admitting: Gastroenterology

## 2018-02-04 ENCOUNTER — Other Ambulatory Visit: Payer: Self-pay

## 2018-02-04 ENCOUNTER — Emergency Department: Payer: Medicare Other

## 2018-02-04 ENCOUNTER — Inpatient Hospital Stay
Admission: EM | Admit: 2018-02-04 | Discharge: 2018-02-06 | DRG: 378 | Disposition: A | Discharge status: Home / Self Care | Payer: Medicare Other | Attending: Internal Medicine | Admitting: Internal Medicine

## 2018-02-04 DIAGNOSIS — Z8249 Family history of ischemic heart disease and other diseases of the circulatory system: Secondary | ICD-10-CM

## 2018-02-04 DIAGNOSIS — Z7952 Long term (current) use of systemic steroids: Secondary | ICD-10-CM | POA: Diagnosis not present

## 2018-02-04 DIAGNOSIS — K31811 Angiodysplasia of stomach and duodenum with bleeding: Principal | ICD-10-CM | POA: Diagnosis present

## 2018-02-04 DIAGNOSIS — D649 Anemia, unspecified: Secondary | ICD-10-CM | POA: Diagnosis present

## 2018-02-04 DIAGNOSIS — I959 Hypotension, unspecified: Secondary | ICD-10-CM

## 2018-02-04 DIAGNOSIS — Z79899 Other long term (current) drug therapy: Secondary | ICD-10-CM | POA: Diagnosis not present

## 2018-02-04 DIAGNOSIS — Z882 Allergy status to sulfonamides status: Secondary | ICD-10-CM | POA: Diagnosis not present

## 2018-02-04 DIAGNOSIS — M329 Systemic lupus erythematosus, unspecified: Secondary | ICD-10-CM | POA: Diagnosis present

## 2018-02-04 DIAGNOSIS — N179 Acute kidney failure, unspecified: Secondary | ICD-10-CM | POA: Diagnosis present

## 2018-02-04 DIAGNOSIS — I10 Essential (primary) hypertension: Secondary | ICD-10-CM | POA: Diagnosis present

## 2018-02-04 DIAGNOSIS — K921 Melena: Secondary | ICD-10-CM

## 2018-02-04 DIAGNOSIS — E872 Acidosis: Secondary | ICD-10-CM | POA: Diagnosis present

## 2018-02-04 DIAGNOSIS — D62 Acute posthemorrhagic anemia: Secondary | ICD-10-CM | POA: Diagnosis not present

## 2018-02-04 DIAGNOSIS — Z880 Allergy status to penicillin: Secondary | ICD-10-CM | POA: Diagnosis not present

## 2018-02-04 DIAGNOSIS — Z87891 Personal history of nicotine dependence: Secondary | ICD-10-CM | POA: Diagnosis not present

## 2018-02-04 DIAGNOSIS — R58 Hemorrhage, not elsewhere classified: Secondary | ICD-10-CM

## 2018-02-04 DIAGNOSIS — Q2733 Arteriovenous malformation of digestive system vessel: Secondary | ICD-10-CM

## 2018-02-04 DIAGNOSIS — K922 Gastrointestinal hemorrhage, unspecified: Secondary | ICD-10-CM | POA: Diagnosis present

## 2018-02-04 DIAGNOSIS — Z7983 Long term (current) use of bisphosphonates: Secondary | ICD-10-CM | POA: Diagnosis not present

## 2018-02-04 DIAGNOSIS — K219 Gastro-esophageal reflux disease without esophagitis: Secondary | ICD-10-CM | POA: Diagnosis present

## 2018-02-04 LAB — CBC
HEMATOCRIT: 23.9 % — AB (ref 35.0–47.0)
Hemoglobin: 7.8 g/dL — ABNORMAL LOW (ref 12.0–16.0)
MCH: 31.6 pg (ref 26.0–34.0)
MCHC: 32.6 g/dL (ref 32.0–36.0)
MCV: 96.9 fL (ref 80.0–100.0)
PLATELETS: 263 10*3/uL (ref 150–440)
RBC: 2.47 MIL/uL — AB (ref 3.80–5.20)
RDW: 15.2 % — ABNORMAL HIGH (ref 11.5–14.5)
WBC: 9.9 10*3/uL (ref 3.6–11.0)

## 2018-02-04 LAB — COMPREHENSIVE METABOLIC PANEL
ALT: 13 U/L (ref 0–44)
AST: 35 U/L (ref 15–41)
Albumin: 3 g/dL — ABNORMAL LOW (ref 3.5–5.0)
Alkaline Phosphatase: 46 U/L (ref 38–126)
Anion gap: 13 (ref 5–15)
BUN: 41 mg/dL — ABNORMAL HIGH (ref 8–23)
CHLORIDE: 104 mmol/L (ref 98–111)
CO2: 21 mmol/L — ABNORMAL LOW (ref 22–32)
CREATININE: 1.69 mg/dL — AB (ref 0.44–1.00)
Calcium: 9.2 mg/dL (ref 8.9–10.3)
GFR, EST AFRICAN AMERICAN: 34 mL/min — AB (ref >60)
GFR, EST NON AFRICAN AMERICAN: 30 mL/min — AB (ref >60)
Glucose, Bld: 208 mg/dL — ABNORMAL HIGH (ref 70–99)
POTASSIUM: 4.2 mmol/L (ref 3.5–5.1)
Sodium: 138 mmol/L (ref 135–145)
Total Bilirubin: 0.4 mg/dL (ref 0.3–1.2)
Total Protein: 5.3 g/dL — ABNORMAL LOW (ref 6.5–8.1)

## 2018-02-04 LAB — PROTIME-INR
INR: 0.93
PROTHROMBIN TIME: 12.4 s (ref 11.4–15.2)

## 2018-02-04 LAB — LIPASE, BLOOD: Lipase: 54 U/L — ABNORMAL HIGH (ref 11–51)

## 2018-02-04 MED ORDER — SODIUM CHLORIDE 0.9 % IV BOLUS
1000.0000 mL | Freq: Once | INTRAVENOUS | Status: AC
  Administered 2018-02-04: 1000 mL via INTRAVENOUS

## 2018-02-04 MED ORDER — SODIUM CHLORIDE 0.9 % IV SOLN
10.0000 mL/h | Freq: Once | INTRAVENOUS | Status: AC
  Administered 2018-02-04: 10 mL/h via INTRAVENOUS

## 2018-02-04 MED ORDER — PANTOPRAZOLE SODIUM 40 MG IV SOLR
40.0000 mg | Freq: Once | INTRAVENOUS | Status: AC
  Administered 2018-02-04: 40 mg via INTRAVENOUS
  Filled 2018-02-04: qty 40

## 2018-02-04 NOTE — ED Triage Notes (Addendum)
Reports noticed blood (dark in color) in stool since this morning.  Patient pale.

## 2018-02-04 NOTE — H&P (Addendum)
[facility] NAME: Judy Judy Torres    MR#:  [id number]  DATE OF BIRTH:  [birth year]  DATE OF ADMISSION:  02/04/2018  PRIMARY CARE PHYSICIAN: [name], MD   REQUESTING/REFERRING PHYSICIAN:   CHIEF COMPLAINT:   Chief Complaint  Patient presents with  . Rectal Bleeding    HISTORY OF PRESENT ILLNESS: Judy Torres  is a 72 y.o. female with a known history of SLE, hypertension and GERD.  Patient was recently hospitalized, approximately 1 month ago, for hematochezia.  She underwent EGD and colonoscopy and was found to have polyps Judy her colon, which were removed.  She required blood transfusion due to low hemoglobin level.  Patient was started on PPI treatment at that time.  She took Protonix until 5 days ago when her prescription ended. Patient is brought Judy today for blood Judy stool, going on for the past 24 hours.  Her stools were initially dark and eventually with bright red blood.  She denies abdominal pain.  No fever/chills, no nausea/vomiting. At the arrival to emergency room patient was noted with tachycardia, heart rate 104 and hypotension, blood pressure 89/60.  The heart rate and blood pressure improved after IV bolus. Blood test done emergency room are notable for low hemoglobin level at 7.8.  Creatinine is 1.69.  BUN is 41. Patient is admitted for further evaluation and treatment.   PAST MEDICAL HISTORY:   Past Medical History:  Diagnosis Date  . GERD (gastroesophageal reflux disease)   . HTN (hypertension)   . Lupus ([facility])     PAST SURGICAL HISTORY:  Past Surgical History:  Procedure Laterality Date  . CARDIAC SURGERY    . CESAREAN SECTION    . COLONOSCOPY N/A 12/30/2017   Procedure: COLONOSCOPY;  Surgeon: Lin Landsman, MD;  Location: [facility] ENDOSCOPY;  Service: Gastroenterology;  Laterality: N/A;  . ESOPHAGOGASTRODUODENOSCOPY N/A 12/30/2017   Procedure: ESOPHAGOGASTRODUODENOSCOPY (EGD);  Surgeon:  Lin Landsman, MD;  Location[facility] ENDOSCOPY;  Service: Gastroenterology;  Laterality: N/A;  . FLEXIBLE SIGMOIDOSCOPY N/A 12/31/2017   Procedure: FLEXIBLE SIGMOIDOSCOPY;  Surgeon: Jonathon Bellows, MD;  Location: [facility] ENDOSCOPY;  Service: Gastroenterology;  Laterality: N/A;    SOCIAL HISTORY:  Social History   Tobacco Use  . Smoking status: Former Research scientist (life sciences)  . Smokeless tobacco: Never Used  Substance Use Topics  . Alcohol use: Not Currently    FAMILY HISTORY:  Family History  Problem Relation Age of Onset  . Hypertension Mother   . Stroke Mother   . Hypertension Sister     DRUG ALLERGIES:  Allergies  Allergen Reactions  . Penicillins Other (See Comments)    Pass out  . Sulfa Antibiotics Nausea Only    REVIEW OF SYSTEMS:   CONSTITUTIONAL: No fever, but patient complains of severe fatigue and generalized weakness.  EYES: No blurred or double vision.  EARS, NOSE, AND THROAT: No tinnitus or ear pain.  RESPIRATORY: No cough, shortness of breath, wheezing or hemoptysis.  CARDIOVASCULAR: No chest pain, orthopnea, edema.  GASTROINTESTINAL: Positive for blood Judy stool.  No nausea, vomiting, or abdominal pain.  GENITOURINARY: No dysuria, hematuria.  ENDOCRINE: No polyuria, nocturia,  HEMATOLOGY: Positive for anemia. SKIN: No rash or lesion. MUSCULOSKELETAL: Positive history of SLE.   NEUROLOGIC: No focal weakness.  PSYCHIATRY: No anxiety or depression.   MEDICATIONS AT HOME:  Prior to Admission medications   Medication Sig Start Date End Date Taking? Authorizing Provider  alendronate (FOSAMAX) 70 MG  tablet Take by mouth.    [provider]  ezetimibe-simvastatin (VYTORIN) 10-40 MG per tablet Take 1 tablet by mouth daily.    [provider]  feeding supplement, ENSURE ENLIVE, (ENSURE ENLIVE) LIQD Take 237 mLs by mouth 2 (two) times daily between meals. 01/02/18   Salary, Avel Peace, MD  hydroxychloroquine (PLAQUENIL) 200 MG tablet TAKE 1 TABLET BY MOUTH TWICE A  DAY 01/22/14   [provider]  lisinopril (PRINIVIL,ZESTRIL) 10 MG tablet Take 10 mg by mouth daily.    [provider]  Multiple Vitamin (MULTIVITAMIN WITH MINERALS) TABS tablet Take 1 tablet by mouth daily. 01/03/18   Salary, Avel Peace, MD  pantoprazole (PROTONIX) 40 MG tablet Take 1 tablet (40 mg total) by mouth daily. 01/03/18   Salary, Avel Peace, MD  predniSONE (DELTASONE) 5 MG tablet Take 5 mg by mouth. 12/12/13   [provider]      PHYSICAL EXAMINATION:   VITAL SIGNS: Blood pressure (!) 89/60, pulse (!) 104, temperature 97.6 F (36.4 C), temperature source Oral, resp. rate 20, height 4\' 11"  (1.499 m), weight 49.9 kg (110 lb).  GENERAL:  72 y.o.-year-old patient lying Judy the bed.  She looks pale, lethargic, acutely ill, but non toxic. EYES: Pupils equal, round, reactive to light and accommodation. No scleral icterus. Extraocular muscles intact.  HEENT: Head atraumatic, normocephalic. Oropharynx and nasopharynx clear.  NECK:  Supple, no jugular venous distention. No thyroid enlargement, no tenderness.  LUNGS: Reduced breath sounds bilaterally, no wheezing, rales,rhonchi or crepitation. No use of accessory muscles of respiration.  CARDIOVASCULAR: S1, S2 normal. No S3/S4.  ABDOMEN: Soft, nontender, nondistended. Bowel sounds present. No organomegaly or mass.  EXTREMITIES: No pedal edema, cyanosis, or clubbing.  NEUROLOGIC: No focal weakness. PSYCHIATRIC: The patient is alert and oriented x 3 but lethargic.  SKIN: Skin is pale.  No obvious rash, lesion, or ulcer.   LABORATORY PANEL:   CBC Recent Labs  Lab 02/04/18 2300  WBC 9.9  HGB 7.8*  HCT 23.9*  PLT 263  MCV 96.9  MCH 31.6  MCHC 32.6  RDW 15.2*   ------------------------------------------------------------------------------------------------------------------  Chemistries  Recent Labs  Lab 02/04/18 2300  NA 138  K 4.2  CL 104  CO2 21*  GLUCOSE 208*  BUN 41*  CREATININE 1.69*  CALCIUM  9.2  AST 35  ALT 13  ALKPHOS 46  BILITOT 0.4   ------------------------------------------------------------------------------------------------------------------ estimated creatinine clearance is 21.1 mL/min (A) (by C-G formula based on SCr of 1.69 mg/dL (H)). ------------------------------------------------------------------------------------------------------------------ No results for input(s): TSH, T4TOTAL, T3FREE, THYROIDAB Judy the last 72 hours.  Invalid input(s): FREET3   Coagulation profile Recent Labs  Lab 02/04/18 2331  INR 0.93   ------------------------------------------------------------------------------------------------------------------- No results for input(s): DDIMER Judy the last 72 hours. -------------------------------------------------------------------------------------------------------------------  Cardiac Enzymes No results for input(s): CKMB, TROPONINI, MYOGLOBIN Judy the last 168 hours.  Invalid input(s): CK ------------------------------------------------------------------------------------------------------------------ Invalid input(s): POCBNP  ---------------------------------------------------------------------------------------------------------------  Urinalysis No results found for: COLORURINE, APPEARANCEUR, LABSPEC, PHURINE, GLUCOSEU, HGBUR, BILIRUBINUR, KETONESUR, PROTEINUR, UROBILINOGEN, NITRITE, LEUKOCYTESUR   RADIOLOGY: No results found.  EKG: Orders placed or performed during the hospital encounter of 02/04/18  . EKG 12-Lead  . EKG 12-Lead    IMPRESSION AND PLAN:  1.  GI bleed.  Will start resuscitation with IV fluids and blood transfusion.  We will start treatment with PPI.  Avoid aspirin, NSAIDs and anticoagulants.  Gastroenterology is consulted for further evaluation and treatment. 2.  Severe, symptomatic anemia.  We will transfuse 1 unit packed RBC.  Continue to monitor hemoglobin level closely. 3.  Lactic acidosis.  Lactic  acid level is elevated at 4, likely related to GI bleed.  We will follow lactic acid level. 4.  ARF, likely prerenal, secondary to poor p.o. intake and GI blood loss.  We will start IV fluids and blood transfusion and continue to monitor kidney function closely.  Avoid nephrotoxic medications. 5.  SLE, stable, continue maintenance therapy.  All the records are reviewed and case discussed with ED provider. Management plans discussed with the patient, family and they are Judy agreement.  CODE STATUS: Full Code Status History    Date Active Date Inactive Code Status Order ID Comments User Context   12/29/2017 0053 01/02/2018 1901 Full Code [id number]  Lance Coon, MD Inpatient       TOTAL TIME TAKING CARE OF THIS PATIENT: 45 minutes.    Amelia Jo M.D on 02/04/2018 at 11:58 PM  Between 7am to 6pm - Pager - [phone]  After 6pm go to [email or web address] - password [email or web address] [facility]  Office  [phone]  CC: Primary care physician; [name], MD

## 2018-02-04 NOTE — ED Provider Notes (Signed)
[facility] Emergency Department Provider Note   ____________________________________________   First MD Initiated Contact with Patient 02/04/18 2316     (approximate)  I have reviewed the triage vital signs and the nursing notes.   HISTORY  Chief Complaint Rectal Bleeding  Level 5 caveat: History limited by distress  HPI [name] is a 72 y.o. female brought to the ED from home by her husband with a chief complaint of GI bleed.  Patient was hospitalized approximately 1 month ago for similar episode; transfused with blood for hematochezia and found to have polyps on colonoscopy which were clipped.  She was also started on a PPI at that time and her baby aspirin was stopped.  Husband states patient has had 2-3 bloody bowel movements tonight.  Patient complains of generalized weakness and abdominal pain.  Denies recent fever, chills, chest pain, shortness of breath, nausea or vomiting.  Denies recent travel or trauma.   Past Medical History:  Diagnosis Date  . GERD (gastroesophageal reflux disease)   . HTN (hypertension)   . Lupus [facility])     Patient Active Problem List   Diagnosis Date Noted  . GIB (gastrointestinal bleeding) 02/04/2018  . GI bleed 12/28/2017  . Lupus ([place]) 12/28/2017  . GERD (gastroesophageal reflux disease) 12/28/2017  . HTN (hypertension) 12/28/2017    Past Surgical History:  Procedure Laterality Date  . CARDIAC SURGERY    . CESAREAN SECTION    . COLONOSCOPY N/A 12/30/2017   Procedure: COLONOSCOPY;  Surgeon: Lin Landsman, MD;  Location: [facility] ENDOSCOPY;  Service: Gastroenterology;  Laterality: N/A;  . ESOPHAGOGASTRODUODENOSCOPY N/A 12/30/2017   Procedure: ESOPHAGOGASTRODUODENOSCOPY (EGD);  Surgeon: Lin Landsman, MD;  Location: [facility] ENDOSCOPY;  Service: Gastroenterology;  Laterality: N/A;  . FLEXIBLE SIGMOIDOSCOPY N/A 12/31/2017   Procedure: FLEXIBLE SIGMOIDOSCOPY;  Surgeon: Jonathon Bellows, MD;  Location: [facility]  ENDOSCOPY;  Service: Gastroenterology;  Laterality: N/A;    Prior to Admission medications   Medication Sig Start Date End Date Taking? Authorizing Provider  alendronate (FOSAMAX) 70 MG tablet Take 70 mg by mouth once a week. Saturdays   Yes [provider]  ezetimibe-simvastatin (VYTORIN) 10-40 MG per tablet Take 1 tablet by mouth daily.   Yes [provider]  hydroxychloroquine (PLAQUENIL) 200 MG tablet TAKE 1 TABLET BY MOUTH TWICE A DAY 01/22/14  Yes [provider]  lisinopril (PRINIVIL,ZESTRIL) 10 MG tablet Take 10 mg by mouth daily.   Yes [provider]  Multiple Vitamin (MULTIVITAMIN WITH MINERALS) TABS tablet Take 1 tablet by mouth daily. 01/03/18  Yes Salary, Avel Peace, MD  predniSONE (DELTASONE) 5 MG tablet Take 5 mg by mouth. 12/12/13  Yes [provider]  feeding supplement, ENSURE ENLIVE, (ENSURE ENLIVE) LIQD Take 237 mLs by mouth 2 (two) times daily between meals. 01/02/18   Salary, Avel Peace, MD  pantoprazole (PROTONIX) 40 MG tablet Take 1 tablet (40 mg total) by mouth daily. Patient not taking: Reported on 02/05/2018 01/03/18   Salary, Avel Peace, MD    Allergies Penicillins and Sulfa antibiotics  Family History  Problem Relation Age of Onset  . Hypertension Mother   . Stroke Mother   . Hypertension Sister     Social History Social History   Tobacco Use  . Smoking status: Former Research scientist (life sciences)  . Smokeless tobacco: Never Used  Substance Use Topics  . Alcohol use: Not Currently  . Drug use: Not Currently    Review of Systems  Constitutional: Positive for generalized weakness.  No fever/chills Eyes: No visual changes. ENT: No sore throat. Cardiovascular: Denies chest pain. Respiratory: Denies shortness of breath. Gastrointestinal: Positive for abdominal pain and bloody stools.  No nausea, no vomiting.  No diarrhea.  No constipation. Genitourinary: Negative for dysuria. Musculoskeletal: Negative for back pain. Skin: Negative  for rash. Neurological: Negative for headaches, focal weakness or numbness.   ____________________________________________   PHYSICAL EXAM:  VITAL SIGNS: ED Triage Vitals  Enc Vitals Group     BP 02/04/18 2254 (!) 89/60     Pulse Rate 02/04/18 2254 (!) 104     Resp 02/04/18 2254 20     Temp 02/04/18 2254 97.6 F (36.4 C)     Temp Source 02/04/18 2254 Oral     SpO2 --      Weight 02/04/18 2251 110 lb (49.9 kg)     Height 02/04/18 2251 4\' 11"  (1.499 m)     Head Circumference --      Peak Flow --      Pain Score 02/04/18 2251 0     Pain Loc --      Pain Edu? --      Excl. in [place]? --     Constitutional: Alert and oriented.  Ill appearing and in moderate acute distress.  Semi-responsive. Eyes: Conjunctivae are normal. PERRL. EOMI. Head: Atraumatic. Nose: No congestion/rhinnorhea. Mouth/Throat: Mucous membranes are moist.  Oropharynx non-erythematous. Neck: No stridor.   Cardiovascular: Tachycardic rate, regular rhythm. Grossly normal heart sounds.  Good peripheral circulation. Respiratory: Normal respiratory effort.  No retractions. Lungs CTAB. Gastrointestinal: Soft and nontender to light or deep palpation. No distention. No abdominal bruits. No CVA tenderness. Musculoskeletal: No lower extremity tenderness nor edema.  No joint effusions. Neurologic:  Normal speech and language. No gross focal neurologic deficits are appreciated.  Skin:  Skin is pale, cool, dry and intact. No rash noted. Psychiatric: Mood and affect are normal. Speech and behavior are normal.  ____________________________________________   LABS (all labs ordered are listed, but only abnormal results are displayed)  Labs Reviewed  COMPREHENSIVE METABOLIC PANEL - Abnormal; Notable for the following components:      Result Value   CO2 21 (*)    Glucose, Bld 208 (*)    BUN 41 (*)    Creatinine, Ser 1.69 (*)    Total Protein 5.3 (*)    Albumin 3.0 (*)    GFR calc non Af Amer 30 (*)    GFR calc Af Amer  34 (*)    All other components within normal limits  CBC - Abnormal; Notable for the following components:   RBC 2.47 (*)    Hemoglobin 7.8 (*)    HCT 23.9 (*)    RDW 15.2 (*)    All other components within normal limits  LACTIC ACID, PLASMA - Abnormal; Notable for the following components:   Lactic Acid, Venous 4.0 (*)    All other components within normal limits  LIPASE, BLOOD - Abnormal; Notable for the following components:   Lipase 54 (*)    All other components within normal limits  APTT - Abnormal; Notable for the following components:   aPTT <24 (*)    All other components within normal limits  BASIC METABOLIC PANEL - Abnormal; Notable for the following components:   Chloride 114 (*)    CO2 21 (*)    BUN 36 (*)    Creatinine, Ser 1.17 (*)    Calcium 7.8 (*)    GFR calc non Af Amer 46 (*)  GFR calc Af Amer 53 (*)    All other components within normal limits  CBC - Abnormal; Notable for the following components:   RBC 2.41 (*)    Hemoglobin 7.7 (*)    HCT 22.4 (*)    RDW 15.4 (*)    All other components within normal limits  PROTIME-INR  LACTIC ACID, PLASMA  LACTIC ACID, PLASMA  POC OCCULT BLOOD, ED  TYPE AND SCREEN  PREPARE RBC (CROSSMATCH)  PREPARE RBC (CROSSMATCH)   ____________________________________________  EKG  ED ECG REPORT I, Tionna Gigante J, the attending physician, personally viewed and interpreted this ECG.   Date: 02/04/2018  EKG Time: 2318  Rate: 92  Rhythm: normal EKG, normal sinus rhythm  Axis: RAD  Intervals:right bundle branch block  ST&T Change: Nonspecific  ____________________________________________  RADIOLOGY  ED MD interpretation: No active cardiopulmonary process  Official radiology report(s): Dg Chest Port 1 View  Result Date: 02/05/2018 CLINICAL DATA:  72 year old female with shortness of breath and chronic cough EXAM: PORTABLE CHEST 1 VIEW COMPARISON:  Chest radiograph dated 01/02/2018 FINDINGS: The lungs are clear.  There is no pleural effusion or pneumothorax. Stable top-normal cardiac size. Median sternotomy wires and CABG vascular clips. No acute osseous pathology. IMPRESSION: No active disease. Electronically Signed   By: Anner Crete M.D.   On: 02/05/2018 00:11    ____________________________________________   PROCEDURES  Procedure(s) performed: None  Procedures  Critical Care performed: Yes, see critical care note(s)   CRITICAL CARE Performed by: Paulette Blanch   Total critical care time: 45 minutes  Critical care time was exclusive of separately billable procedures and treating other patients.  Critical care was necessary to treat or prevent imminent or life-threatening deterioration.  Critical care was time spent personally by me on the following activities: development of treatment plan with patient and/or surrogate as well as nursing, discussions with consultants, evaluation of patient's response to treatment, examination of patient, obtaining history from patient or surrogate, ordering and performing treatments and interventions, ordering and review of laboratory studies, ordering and review of radiographic studies, pulse oximetry and re-evaluation of patient's condition.  ____________________________________________   INITIAL IMPRESSION / ASSESSMENT AND PLAN / ED COURSE  As part of my medical decision making, I reviewed the following data within the [place] History obtained from family, Nursing notes reviewed and incorporated, Labs reviewed, EKG interpreted, Old chart reviewed, Radiograph reviewed, Discussed with admitting physician and Notes from prior ED visits   72 year old female who presents with hematochezia. Differential diagnosis includes, but is not limited to, lower GI bleed, bleeding colon polyps, diverticular bleed, etc.  Will initiate aggressive IV fluid resuscitation.  Will type and screen and start blood transfusion in the ED.  Discussed with  hospitalist who will evaluate patient in the emergency department for admission.   Clinical Course as of Feb 05 638  Sat Feb 04, 2018  2337 Patient feeling slightly better with many warm blankets; IV fluids infusing.  Awaiting blood from lab; will start transfusion as soon as blood is ready.   [JS]  Sun Feb 05, 2018  0017 Lactic Acid, Venous(!!): 4.0 [JS]  0018 Creatinine(!): 1.69 [JS]  0018 Hemoglobin(!): 7.8 [JS]  0018 HCT(!): 23.9 [JS]    Clinical Course User Index [JS] Paulette Blanch, MD     ____________________________________________   FINAL CLINICAL IMPRESSION(S) / ED DIAGNOSES  Final diagnoses:  Bleeding  Hematochezia  Acute GI bleeding  Hypotension, unspecified hypotension type  AKI (acute kidney injury) ([place])  ED Discharge Orders    None       Note:  This document was prepared using Dragon voice recognition software and may include unintentional dictation errors.    Paulette Blanch, MD 02/05/18 [phone]

## 2018-02-05 ENCOUNTER — Inpatient Hospital Stay: Payer: Medicare Other | Admitting: Anesthesiology

## 2018-02-05 ENCOUNTER — Encounter: Payer: Self-pay | Admitting: *Deleted

## 2018-02-05 ENCOUNTER — Encounter
Admission: EM | Disposition: A | Discharge status: Home / Self Care | Payer: Self-pay | Source: Home / Self Care | Attending: Internal Medicine

## 2018-02-05 DIAGNOSIS — K31811 Angiodysplasia of stomach and duodenum with bleeding: Principal | ICD-10-CM

## 2018-02-05 DIAGNOSIS — Q2733 Arteriovenous malformation of digestive system vessel: Secondary | ICD-10-CM

## 2018-02-05 DIAGNOSIS — K921 Melena: Secondary | ICD-10-CM

## 2018-02-05 DIAGNOSIS — D62 Acute posthemorrhagic anemia: Secondary | ICD-10-CM

## 2018-02-05 HISTORY — PX: ESOPHAGOGASTRODUODENOSCOPY: SHX5428

## 2018-02-05 LAB — BASIC METABOLIC PANEL
ANION GAP: 6 (ref 5–15)
BUN: 36 mg/dL — ABNORMAL HIGH (ref 8–23)
CHLORIDE: 114 mmol/L — AB (ref 98–111)
CO2: 21 mmol/L — ABNORMAL LOW (ref 22–32)
Calcium: 7.8 mg/dL — ABNORMAL LOW (ref 8.9–10.3)
Creatinine, Ser: 1.17 mg/dL — ABNORMAL HIGH (ref 0.44–1.00)
GFR calc Af Amer: 53 mL/min — ABNORMAL LOW (ref >60)
GFR, EST NON AFRICAN AMERICAN: 46 mL/min — AB (ref >60)
Glucose, Bld: 89 mg/dL (ref 70–99)
POTASSIUM: 3.8 mmol/L (ref 3.5–5.1)
SODIUM: 141 mmol/L (ref 135–145)

## 2018-02-05 LAB — CBC
HEMATOCRIT: 22.4 % — AB (ref 35.0–47.0)
HEMOGLOBIN: 7.7 g/dL — AB (ref 12.0–16.0)
MCH: 31.9 pg (ref 26.0–34.0)
MCHC: 34.3 g/dL (ref 32.0–36.0)
MCV: 93.1 fL (ref 80.0–100.0)
Platelets: 170 10*3/uL (ref 150–440)
RBC: 2.41 MIL/uL — AB (ref 3.80–5.20)
RDW: 15.4 % — ABNORMAL HIGH (ref 11.5–14.5)
WBC: 5.5 10*3/uL (ref 3.6–11.0)

## 2018-02-05 LAB — LACTIC ACID, PLASMA
LACTIC ACID, VENOUS: 0.9 mmol/L (ref 0.5–1.9)
LACTIC ACID, VENOUS: 4 mmol/L — AB (ref 0.5–1.9)
Lactic Acid, Venous: 0.8 mmol/L (ref 0.5–1.9)

## 2018-02-05 LAB — PREPARE RBC (CROSSMATCH)

## 2018-02-05 LAB — APTT

## 2018-02-05 SURGERY — EGD (ESOPHAGOGASTRODUODENOSCOPY)
Anesthesia: General | Laterality: Left

## 2018-02-05 MED ORDER — HYDROXYCHLOROQUINE SULFATE 200 MG PO TABS
200.0000 mg | ORAL_TABLET | Freq: Two times a day (BID) | ORAL | Status: DC
  Administered 2018-02-05 – 2018-02-06 (×3): 200 mg via ORAL
  Filled 2018-02-05 (×4): qty 1

## 2018-02-05 MED ORDER — LIDOCAINE HCL (PF) 2 % IJ SOLN
INTRAMUSCULAR | Status: AC
  Filled 2018-02-05: qty 10

## 2018-02-05 MED ORDER — FERROUS SULFATE 325 (65 FE) MG PO TABS
325.0000 mg | ORAL_TABLET | Freq: Two times a day (BID) | ORAL | Status: DC
  Administered 2018-02-05 – 2018-02-06 (×2): 325 mg via ORAL
  Filled 2018-02-05 (×2): qty 1

## 2018-02-05 MED ORDER — ENSURE ENLIVE PO LIQD
237.0000 mL | Freq: Two times a day (BID) | ORAL | Status: DC
  Administered 2018-02-05 – 2018-02-06 (×2): 237 mL via ORAL

## 2018-02-05 MED ORDER — HYDROCODONE-ACETAMINOPHEN 5-325 MG PO TABS: 1.0000 | ORAL_TABLET | ORAL | Status: DC | PRN

## 2018-02-05 MED ORDER — PANTOPRAZOLE SODIUM 40 MG IV SOLR
40.0000 mg | Freq: Two times a day (BID) | INTRAVENOUS | Status: DC
  Administered 2018-02-05 – 2018-02-06 (×2): 40 mg via INTRAVENOUS
  Filled 2018-02-05 (×2): qty 40

## 2018-02-05 MED ORDER — PROPOFOL 500 MG/50ML IV EMUL
INTRAVENOUS | Status: DC | PRN
  Administered 2018-02-05: 140 ug/kg/min via INTRAVENOUS

## 2018-02-05 MED ORDER — ADULT MULTIVITAMIN W/MINERALS CH
1.0000 | ORAL_TABLET | Freq: Every day | ORAL | Status: DC
  Administered 2018-02-06: 09:00:00 1 via ORAL
  Filled 2018-02-05 (×2): qty 1

## 2018-02-05 MED ORDER — PANTOPRAZOLE SODIUM 40 MG IV SOLR: 40.0000 mg | Freq: Two times a day (BID) | INTRAVENOUS | Status: DC

## 2018-02-05 MED ORDER — ONDANSETRON HCL 4 MG/2ML IJ SOLN: 4.0000 mg | Freq: Four times a day (QID) | INTRAMUSCULAR | Status: DC | PRN

## 2018-02-05 MED ORDER — ACETAMINOPHEN 650 MG RE SUPP: 650.0000 mg | Freq: Four times a day (QID) | RECTAL | Status: DC | PRN

## 2018-02-05 MED ORDER — GLYCOPYRROLATE 0.2 MG/ML IJ SOLN
INTRAMUSCULAR | Status: DC | PRN
  Administered 2018-02-05: .2 mg via INTRAVENOUS

## 2018-02-05 MED ORDER — LISINOPRIL 10 MG PO TABS
10.0000 mg | ORAL_TABLET | Freq: Every day | ORAL | Status: DC
  Administered 2018-02-05: 11:00:00 10 mg via ORAL
  Filled 2018-02-05: qty 1

## 2018-02-05 MED ORDER — DOCUSATE SODIUM 100 MG PO CAPS
100.0000 mg | ORAL_CAPSULE | Freq: Two times a day (BID) | ORAL | Status: DC
Start: 2018-02-05 — End: 2018-02-06
  Administered 2018-02-05 – 2018-02-06 (×3): 100 mg via ORAL
  Filled 2018-02-05 (×3): qty 1

## 2018-02-05 MED ORDER — SODIUM CHLORIDE 0.9 % IV SOLN
INTRAVENOUS | Status: DC
  Administered 2018-02-05: 02:00:00 via INTRAVENOUS

## 2018-02-05 MED ORDER — PROPOFOL 10 MG/ML IV BOLUS
INTRAVENOUS | Status: DC | PRN
  Administered 2018-02-05: 70 mg via INTRAVENOUS

## 2018-02-05 MED ORDER — LIDOCAINE HCL (CARDIAC) PF 100 MG/5ML IV SOSY
PREFILLED_SYRINGE | INTRAVENOUS | Status: DC | PRN
  Administered 2018-02-05: 40 mg via INTRAVENOUS

## 2018-02-05 MED ORDER — PANTOPRAZOLE SODIUM 40 MG PO TBEC: 40.0000 mg | DELAYED_RELEASE_TABLET | Freq: Two times a day (BID) | ORAL | Status: DC

## 2018-02-05 MED ORDER — EZETIMIBE-SIMVASTATIN 10-40 MG PO TABS: 1.0000 | ORAL_TABLET | Freq: Every day | ORAL | Status: DC

## 2018-02-05 MED ORDER — SIMVASTATIN 20 MG PO TABS
40.0000 mg | ORAL_TABLET | Freq: Every day | ORAL | Status: DC
  Administered 2018-02-05 – 2018-02-06 (×2): 40 mg via ORAL
  Filled 2018-02-05 (×2): qty 2

## 2018-02-05 MED ORDER — EZETIMIBE 10 MG PO TABS
10.0000 mg | ORAL_TABLET | Freq: Every day | ORAL | Status: DC
  Administered 2018-02-05 – 2018-02-06 (×2): 10 mg via ORAL
  Filled 2018-02-05 (×2): qty 1

## 2018-02-05 MED ORDER — BISACODYL 5 MG PO TBEC: 5.0000 mg | DELAYED_RELEASE_TABLET | Freq: Every day | ORAL | Status: DC | PRN

## 2018-02-05 MED ORDER — PREDNISONE 10 MG PO TABS
5.0000 mg | ORAL_TABLET | Freq: Every day | ORAL | Status: DC
  Administered 2018-02-05 – 2018-02-06 (×2): 5 mg via ORAL
  Filled 2018-02-05 (×2): qty 1

## 2018-02-05 MED ORDER — SODIUM CHLORIDE 0.9 % IV SOLN
8.0000 mg/h | INTRAVENOUS | Status: DC
  Administered 2018-02-05: 03:00:00 8 mg/h via INTRAVENOUS
  Filled 2018-02-05 (×2): qty 80

## 2018-02-05 MED ORDER — ACETAMINOPHEN 325 MG PO TABS
650.0000 mg | ORAL_TABLET | Freq: Four times a day (QID) | ORAL | Status: DC | PRN
  Administered 2018-02-05 – 2018-02-06 (×2): 650 mg via ORAL
  Filled 2018-02-05 (×2): qty 2

## 2018-02-05 MED ORDER — PROPOFOL 10 MG/ML IV BOLUS
INTRAVENOUS | Status: AC
  Filled 2018-02-05: qty 20

## 2018-02-05 MED ORDER — ONDANSETRON HCL 4 MG PO TABS: 4.0000 mg | ORAL_TABLET | Freq: Four times a day (QID) | ORAL | Status: DC | PRN

## 2018-02-05 MED ORDER — TRAZODONE HCL 50 MG PO TABS: 25.0000 mg | ORAL_TABLET | Freq: Every evening | ORAL | Status: DC | PRN

## 2018-02-05 NOTE — Transfer of Care (Signed)
Immediate Anesthesia Transfer of Care Note  Patient: Judy Torres  Procedure(s) Performed: ESOPHAGOGASTRODUODENOSCOPY (EGD) (Left )  Patient Location: PACU  Anesthesia Type:General  Level of Consciousness: sedated  Airway & Oxygen Therapy: Patient Spontanous Breathing and Patient connected to nasal cannula oxygen  Post-op Assessment: Report given to RN and Post -op Vital signs reviewed and stable  Post vital signs: Reviewed and stable  Last Vitals:  Vitals Value Taken Time  BP 90/54 02/05/2018  9:54 AM  Temp    Pulse 78 02/05/2018  9:54 AM  Resp 16 02/05/2018  9:54 AM  SpO2 100 % 02/05/2018  9:54 AM    Last Pain:  Vitals:   02/05/18 0221  TempSrc: Oral  PainSc:          Complications: No apparent anesthesia complications

## 2018-02-05 NOTE — Consult Note (Signed)
Judy Antigua, MD [address] [place], [place], [place], [address] [id number], [place], [place], [place], [address] Phone: [phone]  Fax: [phone]  Consultation  Referring Provider:     Dr. Anselm Jungling Primary Care Physician:  [name], MD Reason for Consultation:     Hematochezia  Date of Admission:  02/04/2018 Date of Consultation:  02/05/2018         HPI:   [name] is a 72 y.o. female presents with 1 day history of hematochezia.  Reports 4-5 bowel movements that occurred yesterday, with blood streaks with stool.  Last bowel movement was at 10 PM yesterday and also had blood streaks.  No clots.  No abdominal pain, nausea or vomiting.  No dysphagia.  No weight loss.  No melena.  Denies any NSAID use.  Has history of SLE, is on hydroxychloroquine, and prednisone as an outpatient.  Has recent history of GI bleed about a month ago.  Was seen by Dr. Marius Ditch for inpatient consult for dark red blood per rectum with clots.  Was found to have drop in hemoglobin to 7 with previous hemoglobin being 12.5 prior to that.  She underwent EGD and colonoscopy during that visit.  EGD showed nonbleeding gastric erosions in the antrum, and was otherwise normal.  Colonoscopy showed several polyps that were removed, with the largest being 19 mm in the rectum as detailed below:  "- One 5 mm polyp in the ascending colon, removed with a cold snare. Resected and retrieved. - Two 7 mm polyps in the transverse colon, removed with a hot snare. Resected and retrieved. - Five 5 to 7 mm polyps in the descending colon, removed with a hot snare. Resected and retrieved. - One 7 mm polyp in the sigmoid colon, removed with a hot snare. Resected and retrieved. - One 8 mm polyp at the recto-sigmoid colon, removed with a hot snare. Resected and retrieved. - One 19 mm polyp in the rectum, removed with a hot snare. Resected and retrieved. - Moderate diverticulosis in the entire examined colon.  There was no evidence of diverticular bleeding. - Non-bleeding internal hemorrhoids. - Yellow stool in colon, no fresh or old blood seen"  She subsequently had hematochezia after her colonoscopy and underwent flex sig which showed a rectal ulcer at the site of the rectal polypectomy, and blood in the rectum, and clips were placed by Dr. Vicente Males.  Patient states since discharge she had not had any episodes of GI bleeding until yesterday.  Pathology from the EGD and colonoscopy as below:  "DIAGNOSIS:  A. RANDOM STOMACH; COLD BIOPSY:  - ANTRAL AND OXYNTIC MUCOSA WITH MINIMAL TO MILD CHRONIC GASTRITIS.  - REGENERATIVE/REPARATIVE CHANGE.  - NEGATIVE FOR H. PYLORI, DYSPLASIA AND MALIGNANCY.   B. COLON POLYP X2, TRANSVERSE; HOT SNARE:  - TUBULAR ADENOMAS (2).  - NEGATIVE FOR HIGH-GRADE DYSPLASIA AND MALIGNANCY.   C. COLON POLYP X1; COLD BIOPSY, COLON POLYP X5; HOT SNARE, COLON POLYP  X1, DESCENDING; COLD SNARE:  - TUBULAR ADENOMAS (6 FRAGMENTS), NEGATIVE FOR HIGH-GRADE DYSPLASIA AND  MALIGNANCY.  - PROMINENT LYMPHOID AGGREGATE (1).   D. COLON POLYP, SIGMOID; HOT SNARE:  -TUBULAR ADENOMA.  - NEGATIVE FOR HIGH-GRADE DYSPLASIA AND MALIGNANCY.   E. COLON POLYP, RECTOSIGMOID; HOT SNARE:  - TUBULAR ADENOMA.  - NEGATIVE FOR HIGH-GRADE DYSPLASIA AND MALIGNANCY.   F. RECTUM POLYP; HOT SNARE:  - TUBULOVILLOUS ADENOMA.  - NEGATIVE FOR HIGH-GRADE DYSPLASIA AND MALIGNANCY.  - THE INKED BASE APPEARS CLEAR. "  Past Medical History:  Diagnosis Date  . GERD (gastroesophageal reflux disease)   . HTN (hypertension)   . Lupus [facility])     Past Surgical History:  Procedure Laterality Date  . CARDIAC SURGERY    . CESAREAN SECTION    . COLONOSCOPY N/A 12/30/2017   Procedure: COLONOSCOPY;  Surgeon: Lin Landsman, MD;  Location: [facility] ENDOSCOPY;  Service: Gastroenterology;  Laterality: N/A;  . ESOPHAGOGASTRODUODENOSCOPY N/A 12/30/2017   Procedure: ESOPHAGOGASTRODUODENOSCOPY (EGD);   Surgeon: Lin Landsman, MD;  Location: [facility] ENDOSCOPY;  Service: Gastroenterology;  Laterality: N/A;  . FLEXIBLE SIGMOIDOSCOPY N/A 12/31/2017   Procedure: FLEXIBLE SIGMOIDOSCOPY;  Surgeon: Jonathon Bellows, MD;  Location: [facility] ENDOSCOPY;  Service: Gastroenterology;  Laterality: N/A;    Prior to Admission medications   Medication Sig Start Date End Date Taking? Authorizing Provider  alendronate (FOSAMAX) 70 MG tablet Take 70 mg by mouth once a week. Saturdays   Yes [provider]  ezetimibe-simvastatin (VYTORIN) 10-40 MG per tablet Take 1 tablet by mouth daily.   Yes [provider]  hydroxychloroquine (PLAQUENIL) 200 MG tablet TAKE 1 TABLET BY MOUTH TWICE A DAY 01/22/14  Yes [provider]  lisinopril (PRINIVIL,ZESTRIL) 10 MG tablet Take 10 mg by mouth daily.   Yes [provider]  Multiple Vitamin (MULTIVITAMIN WITH MINERALS) TABS tablet Take 1 tablet by mouth daily. 01/03/18  Yes Salary, Avel Peace, MD  predniSONE (DELTASONE) 5 MG tablet Take 5 mg by mouth. 12/12/13  Yes [provider]  feeding supplement, ENSURE ENLIVE, (ENSURE ENLIVE) LIQD Take 237 mLs by mouth 2 (two) times daily between meals. 01/02/18   Salary, Avel Peace, MD  pantoprazole (PROTONIX) 40 MG tablet Take 1 tablet (40 mg total) by mouth daily. Patient not taking: Reported on 02/05/2018 01/03/18   Salary, Avel Peace, MD    Family History  Problem Relation Age of Onset  . Hypertension Mother   . Stroke Mother   . Hypertension Sister      Social History   Tobacco Use  . Smoking status: Former Research scientist (life sciences)  . Smokeless tobacco: Never Used  Substance Use Topics  . Alcohol use: Not Currently  . Drug use: Not Currently    Allergies as of 02/04/2018 - Review Complete 02/04/2018  Allergen Reaction Noted  . Penicillins Other (See Comments) 02/28/2014  . Sulfa antibiotics Nausea Only 02/28/2014    Review of Systems:    All systems reviewed and negative except where noted in HPI.    Physical Exam:  Vital signs in last 24 hours: Vitals:   02/05/18 0100 02/05/18 0115 02/05/18 0136 02/05/18 0221  BP: 121/75 110/61 115/65 (!) 107/57  Pulse: 89 78 79 72  Resp: 14 (!) 26 15 (!) 24  Temp:   98.3 F (36.8 C) 98.4 F (36.9 C)  TempSrc:   Oral Oral  SpO2: 100% 100% 100% 100%  Weight:   104 lb (47.2 kg)   Height:   4\' 11"  (1.499 m)    Last BM Date: 02/04/18 General:   Pleasant, cooperative in NAD Head:  Normocephalic and atraumatic. Eyes:   No icterus.   Conjunctiva pink. PERRLA. Ears:  Normal auditory acuity. Neck:  Supple; no masses or thyroidomegaly Lungs: Respirations even and unlabored. Lungs clear to auscultation bilaterally.   No wheezes, crackles, or rhonchi.  Abdomen:  Soft, nondistended, nontender. Normal bowel sounds. No appreciable masses or hepatomegaly.  No rebound or guarding.  Neurologic:  Alert and oriented x3;  grossly normal neurologically. Skin:  Intact without  significant lesions or rashes. Cervical Nodes:  No significant cervical adenopathy. Psych:  Alert and cooperative. Normal affect.  LAB RESULTS: Recent Labs    02/04/18 2300 02/05/18 0348  WBC 9.9 5.5  HGB 7.8* 7.7*  HCT 23.9* 22.4*  PLT 263 170   BMET Recent Labs    02/04/18 2300 02/05/18 0348  NA 138 141  K 4.2 3.8  CL 104 114*  CO2 21* 21*  GLUCOSE 208* 89  BUN 41* 36*  CREATININE 1.69* 1.17*  CALCIUM 9.2 7.8*   LFT Recent Labs    02/04/18 2300  PROT 5.3*  ALBUMIN 3.0*  AST 35  ALT 13  ALKPHOS 46  BILITOT 0.4   PT/INR Recent Labs    02/04/18 2331  LABPROT 12.4  INR 0.93    STUDIES: Dg Chest Port 1 View  Result Date: 02/05/2018 CLINICAL DATA:  72 year old female with shortness of breath and chronic cough EXAM: PORTABLE CHEST 1 VIEW COMPARISON:  Chest radiograph dated 01/02/2018 FINDINGS: The lungs are clear. There is no pleural effusion or pneumothorax. Stable top-normal cardiac size. Median sternotomy wires and CABG vascular clips. No acute osseous  pathology. IMPRESSION: No active disease. Electronically Signed   By: Anner Crete M.D.   On: 02/05/2018 00:11      Impression / Plan:   [name] is a 72 y.o. y/o female with 1 day history of hematochezia at home, with recent GI bleeding 1 month ago, with EGD showing gastric erosions but no bleeding, and colonoscopy with several polyps removed, with subsequent hematochezia requiring flex sig one day later, with blood in the rectum seen, and rectal ulcer seen likely from rectal polypectomy site that was clipped  Patient's hemoglobin is 7.7 on this admission, and was 8.1 on discharge last month Therefore, it has not improved back to her baseline  No source of GI bleeding was identified on her last EGD and colonoscopy, but rather polypectomy site from her colonoscopy led to subsequent hematochezia during that admission.    Therefore repeat endoscopic evaluation, is indicated to evaluate source of anemia and hematochezia  We will plan on EGD today to rule out source of upper GI bleed If negative, patient will need colonoscopy as she is having hematochezia, and to evaluate all previous polypectomy site to rule out source of lower GI bleeding as well  If EGD and colonoscopy are negative, small bowel capsule on this admission indicated as well  Please check ferritin and iron labs as ferritin was low normal on last admission. If low, transfuse IV Iron PPI IV twice daily  Continue serial CBCs and transfuse PRN Avoid NSAIDs Maintain 2 large-bore IV lines Please page GI with any acute hemodynamic changes, or signs of active GI bleeding   Thank you for involving me in the care of this patient.      LOS: 1 day   Virgel Manifold, MD  02/05/2018, 9:05 AM

## 2018-02-05 NOTE — Progress Notes (Signed)
Pt transported back to room by this nurse and Sharen Hones. Pt in stable condition. Receiving RN, Jinny Blossom, given report at bedside.

## 2018-02-05 NOTE — Progress Notes (Signed)
[place] [facility] [place] NAME: [name]    MR#:  [id number]  DATE OF BIRTH:  16-Jun-1946  SUBJECTIVE:  CHIEF COMPLAINT:   Chief Complaint  Patient presents with  . Rectal Bleeding   Patient was admitted last month for GI bleed, was discharged on oral PPI twice daily.  She finished her medications for 1 month and the last few days then out of the prescription now.  Brought again with blood in stool.  Received blood transfusion last night, had EGD done this morning, feels much better. REVIEW OF SYSTEMS:  CONSTITUTIONAL: No fever, have fatigue or weakness.  EYES: No blurred or double vision.  EARS, NOSE, AND THROAT: No tinnitus or ear pain.  RESPIRATORY: No cough, shortness of breath, wheezing or hemoptysis.  CARDIOVASCULAR: No chest pain, orthopnea, edema.  GASTROINTESTINAL: No nausea, vomiting, diarrhea or abdominal pain.  GENITOURINARY: No dysuria, hematuria.  ENDOCRINE: No polyuria, nocturia,  HEMATOLOGY: No anemia, easy bruising or bleeding SKIN: No rash or lesion. MUSCULOSKELETAL: No joint pain or arthritis.   NEUROLOGIC: No tingling, numbness, weakness.  PSYCHIATRY: No anxiety or depression.   ROS  DRUG ALLERGIES:   Allergies  Allergen Reactions  . Penicillins Other (See Comments)    Pass out  . Sulfa Antibiotics Nausea Only    VITALS:  Blood pressure 107/62, pulse 72, temperature 98 F (36.7 C), temperature source Oral, resp. rate (!) 28, height 4\' 11"  (1.499 m), weight 47.2 kg (104 lb), SpO2 92 %.  PHYSICAL EXAMINATION:  GENERAL:  72 y.o.-year-old patient lying in the bed with no acute distress.  EYES: Pupils equal, round, reactive to light and accommodation. No scleral icterus. Extraocular muscles intact.  HEENT: Head atraumatic, normocephalic. Oropharynx and nasopharynx clear.  NECK:  Supple, no jugular venous distention. No thyroid enlargement, no tenderness.  LUNGS: Normal breath sounds bilaterally, no wheezing,  rales,rhonchi or crepitation. No use of accessory muscles of respiration.  CARDIOVASCULAR: S1, S2 normal. No murmurs, rubs, or gallops.  ABDOMEN: Soft, nontender, nondistended. Bowel sounds present. No organomegaly or mass.  EXTREMITIES: No pedal edema, cyanosis, or clubbing.  NEUROLOGIC: Cranial nerves II through XII are intact. Muscle strength 5/5 in all extremities. Sensation intact. Gait not checked.  PSYCHIATRIC: The patient is alert and oriented x 3.  SKIN: No obvious rash, lesion, or ulcer.   Physical Exam LABORATORY PANEL:   CBC Recent Labs  Lab 02/05/18 0348  WBC 5.5  HGB 7.7*  HCT 22.4*  PLT 170   ------------------------------------------------------------------------------------------------------------------  Chemistries  Recent Labs  Lab 02/04/18 2300 02/05/18 0348  NA 138 141  K 4.2 3.8  CL 104 114*  CO2 21* 21*  GLUCOSE 208* 89  BUN 41* 36*  CREATININE 1.69* 1.17*  CALCIUM 9.2 7.8*  AST 35  --   ALT 13  --   ALKPHOS 46  --   BILITOT 0.4  --    ------------------------------------------------------------------------------------------------------------------  Cardiac Enzymes No results for input(s): TROPONINI in the last 168 hours. ------------------------------------------------------------------------------------------------------------------  RADIOLOGY:  Dg Chest Port 1 View  Result Date: 02/05/2018 CLINICAL DATA:  72 year old female with shortness of breath and chronic cough EXAM: PORTABLE CHEST 1 VIEW COMPARISON:  Chest radiograph dated 01/02/2018 FINDINGS: The lungs are clear. There is no pleural effusion or pneumothorax. Stable top-normal cardiac size. Median sternotomy wires and CABG vascular clips. No acute osseous pathology. IMPRESSION: No active disease. Electronically Signed   By: Anner Crete M.D.   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  GI bleed.    resuscitation with IV fluids and blood transfusion.  Stopped IV fluid now as patient is on oral diet.   treatment with PPI.    Initially started on IV drip, after EGD GI suggested to switch oral twice daily. Avoid aspirin, NSAIDs and anticoagulants.   Gastroenterology is consulted for further evaluation and treatment.   EGD done, showed some AV malformation in bleed in duodenum which was cauterized. Suggested to continue liquid diet today and observe for 24 to 48 hours. 2.  Severe, symptomatic anemia.    transfused 1 unit packed RBC.  Continue to monitor hemoglobin level closely. 3.  Lactic acidosis.  Lactic acid level is elevated at 4, likely related to GI bleed.  We will follow lactic acid level. Came down. 4.  ARF, likely prerenal, secondary to poor p.o. intake and GI blood loss.   IV fluids and blood transfusion and continue to monitor kidney function closely.  Avoid nephrotoxic medications. Improved. 5.  SLE, stable, continue maintenance therapy.    All the records are reviewed and case discussed with Care Management/Social Workerr. Management plans discussed with the patient, family and they are in agreement.  CODE STATUS: Full.  TOTAL TIME TAKING CARE OF THIS PATIENT: 35 minutes.    POSSIBLE D/C IN 1-2 DAYS, DEPENDING ON CLINICAL CONDITION.   Vaughan Basta M.D on 02/05/2018   Between 7am to 6pm - Pager - [phone]  After 6pm go to [email or web address] - password [email or web address] [facility]  Office  [phone]  CC: Primary care physician; [name], MD  Note: This dictation was prepared with Dragon dictation along with smaller phrase technology. Any transcriptional errors that result from this process are unintentional.

## 2018-02-05 NOTE — Op Note (Addendum)
[facility] Patient Name: Judy Torres Procedure Date: 02/05/2018 9:19 AM MRN: [id number] Account #: [id number] Date of Birth: 1946/04/29 Admit Type: Inpatient Age: 72 Room: [facility] ENDO ROOM 4 Gender: Female Note Status: Finalized Procedure:            Upper GI endoscopy Indications:          Hematochezia Providers:            Runette Scifres B. Bonna Gains MD, MD Medicines:            Monitored Anesthesia Care Complications:        No immediate complications. Procedure:            Pre-Anesthesia Assessment:                       - The risks and benefits of the procedure and the                        sedation options and risks were discussed with the                        patient. All questions were answered and informed                        consent was obtained.                       - Patient identification and proposed procedure were                        verified prior to the procedure.                       - ASA Grade Assessment: III - A patient with severe                        systemic disease.                       After obtaining informed consent, the endoscope was                        passed under direct vision. Throughout the procedure,                        the patient's blood pressure, pulse, and oxygen                        saturations were monitored continuously. The Endoscope                        was introduced through the mouth, and advanced to the                        fourth part of duodenum. The upper GI endoscopy was                        accomplished with ease. The patient tolerated the                        procedure well. Findings:      The examined  esophagus was normal.      The entire examined stomach was normal.      The duodenal bulb was normal.      Two 4 to 7 mm angiodysplastic lesions with bleeding were found Judy the       second portion of the duodenum. Coagulation for hemostasis using argon       plasma  was successful. Impression:           - Normal esophagus.                       - Normal stomach.                       - Normal duodenal bulb.                       - Two bleeding angiodysplastic lesions Judy the duodenum.                        Treated with argon plasma coagulation (APC).                       - No specimens collected. Recommendation:       - Perform a serologic workup for celiac disease.                       - Check Ferritin, and iron labs and start IV Iron if low                       - Observe inpatient for 24-48 hrs prior to discharge                       - Patient may have other AVMs more distally Judy the                        small bowel that were not seen on today's exam that                        could be contributing to her anemia. She will thus                        benefit from Iron replacement if her iron level is low.                       - Continue Serial CBCs and transfuse PRN                       - If no further signs of active GI bleeding and no                        further drop Judy Hemoglobin, no further endoscopic                        evaluation needed on this admission.                       -If repeated GI bleeding occurs repeat EGD with  possible Colonoscopy and small bowel capsule would need                        to be considered                       -Clear liquid diet                       - Continue present medications.                       - Use Protonix (pantoprazole) 40 mg PO BID for 1 month.                       - The findings and recommendations were discussed with                        the patient. Procedure Code(s):    --- Professional ---                       [phone], Esophagogastroduodenoscopy, flexible, transoral;                        with control of bleeding, any method Diagnosis Code(s):    --- Professional ---                       T65.465, Angiodysplasia of stomach and duodenum with                         bleeding                       K92.1, Melena (includes Hematochezia) CPT copyright 2017 American Medical Association. All rights reserved. The codes documented Judy this report are preliminary and upon coder review may  be revised to meet current compliance requirements.  [name], MD Margretta Sidle B. Bonna Gains MD, MD 02/05/2018 10:16:57 AM This report has been signed electronically. Number of Addenda: 0 Note Initiated On: 02/05/2018 9:19 AM Estimated Blood Loss: Estimated blood loss: none.      [facility]

## 2018-02-05 NOTE — Anesthesia Post-op Follow-up Note (Signed)
Anesthesia QCDR form completed.        

## 2018-02-05 NOTE — ED Notes (Signed)
Critical lactic acid result told to MD Beather Arbour.

## 2018-02-05 NOTE — Progress Notes (Signed)
Foley removed at 1235, pt has until 2035 to void.

## 2018-02-05 NOTE — Anesthesia Preprocedure Evaluation (Signed)
Anesthesia Evaluation  Patient identified by MRN, date of birth, ID band Patient awake    Reviewed: Allergy & Precautions, H&P , NPO status , Patient's Chart, lab work & pertinent test results, reviewed documented beta blocker date and time   Airway Mallampati: II   Neck ROM: full    Dental  (+) Teeth Intact   Pulmonary neg pulmonary ROS, former smoker,    Pulmonary exam normal        Cardiovascular Exercise Tolerance: Poor hypertension, On Medications negative cardio ROS Normal cardiovascular exam Rhythm:regular Rate:Normal     Neuro/Psych negative neurological ROS  negative psych ROS   GI/Hepatic negative GI ROS, Neg liver ROS, GERD  ,  Endo/Other  negative endocrine ROS  Renal/GU negative Renal ROS  negative genitourinary   Musculoskeletal   Abdominal   Peds  Hematology negative hematology ROS (+)   Anesthesia Other Findings Past Medical History: No date: GERD (gastroesophageal reflux disease) No date: HTN (hypertension) No date: Lupus ([place]) Past Surgical History: No date: CARDIAC SURGERY No date: CESAREAN SECTION 12/30/2017: COLONOSCOPY; N/A     Comment:  Procedure: COLONOSCOPY;  Surgeon: Lin Landsman,               MD;  Location: [facility] ENDOSCOPY;  Service:               Gastroenterology;  Laterality: N/A; 12/30/2017: ESOPHAGOGASTRODUODENOSCOPY; N/A     Comment:  Procedure: ESOPHAGOGASTRODUODENOSCOPY (EGD);  Surgeon:               Lin Landsman, MD;  Location: [facility] ENDOSCOPY;                Service: Gastroenterology;  Laterality: N/A; 12/31/2017: FLEXIBLE SIGMOIDOSCOPY; N/A     Comment:  Procedure: FLEXIBLE SIGMOIDOSCOPY;  Surgeon: Jonathon Bellows, MD;  Location: [facility] ENDOSCOPY;  Service:               Gastroenterology;  Laterality: N/A; BMI    Body Mass Index:  21.01 kg/m     Reproductive/Obstetrics negative OB ROS                              Anesthesia Physical Anesthesia Plan  ASA: III and emergent  Anesthesia Plan: General   Post-op Pain Management:    Induction:   PONV Risk Score and Plan:   Airway Management Planned:   Additional Equipment:   Intra-op Plan:   Post-operative Plan:   Informed Consent: I have reviewed the patients History and Physical, chart, labs and discussed the procedure including the risks, benefits and alternatives for the proposed anesthesia with the patient or authorized representative who has indicated his/her understanding and acceptance.   Dental Advisory Given  Plan Discussed with: CRNA  Anesthesia Plan Comments:         Anesthesia Quick Evaluation

## 2018-02-06 ENCOUNTER — Encounter: Payer: Self-pay | Admitting: Gastroenterology

## 2018-02-06 LAB — CBC
HCT: 23.1 % — ABNORMAL LOW (ref 35.0–47.0)
HEMOGLOBIN: 8 g/dL — AB (ref 12.0–16.0)
MCH: 32.2 pg (ref 26.0–34.0)
MCHC: 34.5 g/dL (ref 32.0–36.0)
MCV: 93.3 fL (ref 80.0–100.0)
Platelets: 190 10*3/uL (ref 150–440)
RBC: 2.48 MIL/uL — ABNORMAL LOW (ref 3.80–5.20)
RDW: 15.7 % — AB (ref 11.5–14.5)
WBC: 6.3 10*3/uL (ref 3.6–11.0)

## 2018-02-06 LAB — GLUCOSE, CAPILLARY: GLUCOSE-CAPILLARY: 68 mg/dL — AB (ref 70–99)

## 2018-02-06 MED ORDER — PANTOPRAZOLE SODIUM 40 MG PO TBEC: 40.0000 mg | DELAYED_RELEASE_TABLET | Freq: Two times a day (BID) | ORAL | Status: DC

## 2018-02-06 MED ORDER — FERROUS SULFATE 325 (65 FE) MG PO TABS: 325.0000 mg | ORAL_TABLET | Freq: Two times a day (BID) | ORAL | 3 refills | Status: DC

## 2018-02-06 MED ORDER — PANTOPRAZOLE SODIUM 40 MG PO TBEC: 40.0000 mg | DELAYED_RELEASE_TABLET | Freq: Two times a day (BID) | ORAL | 1 refills | Status: DC

## 2018-02-06 NOTE — Progress Notes (Signed)
Pt is being discharged today, discharge instructions were given and she verified understanding. She was given two paper prescriptions, all belongings were packed and returned to her. She was rolled out in a wheelchair by staff.

## 2018-02-06 NOTE — Discharge Summary (Signed)
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 GERD (gastroesophageal reflux disease)   . HTN (hypertension)   . Lupus [facility])     HOSPITAL COURSE:   1.GI bleed.  resuscitation with IV fluids and blood transfusion.  Stopped IV fluid now as patient is on oral diet. treatment with PPI.  Initially started on IV drip, after EGD GI suggested to switch oral twice daily. Avoid aspirin, NSAIDs and anticoagulants.  Gastroenterology is consulted for further evaluation and treatment.   EGD done, showed some AV malformation in bleed in duodenum which was cauterized. Tolerated liquid and then soft diet Cont PPI oral BID for 1 mth and then follow in GI clinic. 2.Severe,symptomatic anemia.  transfused 1 unit packed RBC.Continue to monitor hemoglobin level closely.   Hb stable, give oral iron on d/c. 3.Lactic acidosis.Lactic acid level is elevated at 4,likely related to GI bleed.We will follow lactic acid level. Came down. 4. ARF,likely prerenal, secondary to poor p.o. intake and GI blood loss.  IV fluids and blood transfusion and continue to monitor kidney function closely. Avoid nephrotoxic medications. Improved. 5.SLE,stable,continue maintenance therapy.   DISCHARGE CONDITIONS:   Stable.  CONSULTS OBTAINED:  Treatment Team:   Virgel Manifold, MD  DRUG ALLERGIES:   Allergies  Allergen Reactions  . Penicillins Other (See Comments)    Pass out  . Sulfa Antibiotics Nausea Only    DISCHARGE MEDICATIONS:   Allergies as of 02/06/2018      Reactions   Penicillins Other (See Comments)   Pass out   Sulfa Antibiotics Nausea Only      Medication List    STOP taking these medications   lisinopril 10 MG tablet Commonly known as:  PRINIVIL,ZESTRIL     TAKE these medications   alendronate 70 MG tablet Commonly known as:  FOSAMAX Take 70 mg by mouth once a week. Saturdays   ezetimibe-simvastatin 10-40 MG tablet Commonly known as:  VYTORIN Take 1 tablet by mouth daily.   feeding supplement (ENSURE ENLIVE) Liqd Take 237 mLs by mouth 2 (two) times daily between meals.   ferrous sulfate 325 (65 FE) MG tablet Take 1 tablet (325 mg total) by mouth 2 (two) times daily with a meal.   hydroxychloroquine 200 MG tablet Commonly known as:  PLAQUENIL TAKE 1 TABLET BY MOUTH TWICE A DAY   multivitamin with minerals Tabs tablet Take 1 tablet by mouth daily.   pantoprazole 40 MG tablet Commonly known as:  PROTONIX Take 1 tablet (40 mg total) by mouth 2 (two) times daily before a meal. What changed:  when to take this   predniSONE 5 MG tablet Commonly known as:  DELTASONE Take 5 mg by mouth.        DISCHARGE INSTRUCTIONS:    Follow with GI In 2 weeks.  If you experience worsening of your admission symptoms, develop shortness of breath, life threatening emergency,  suicidal or homicidal thoughts you must seek medical attention immediately by calling 911 or calling your MD immediately  if symptoms less severe.  You Must read complete instructions/literature along with all the possible adverse reactions/side effects for all the Medicines you take and that have been prescribed to you. Take any new Medicines after you have completely understood and accept all the possible adverse reactions/side effects.    Please note  You were cared for by a hospitalist during your hospital stay. If you have any questions about your discharge medications or the care you received while you were in the hospital after you are discharged, you can call the unit and asked to speak with the hospitalist on call if the hospitalist that took care of you is not available. Once you are discharged, your primary care physician will handle any further medical issues. Please note that NO REFILLS for any discharge medications will be authorized once you are discharged, as it is imperative that you return to your primary care physician (or establish a relationship with a primary care physician if you do not have one) for your aftercare needs so that they can reassess your need for medications and monitor your lab values.    Today   CHIEF COMPLAINT:   Chief Complaint  Patient presents with  . Rectal Bleeding    HISTORY OF PRESENT ILLNESS:  [name]  is a 72 y.o. female with a known history of SLE, hypertension and GERD.  Patient was recently hospitalized, approximately 1 month ago, for hematochezia.  She underwent EGD and colonoscopy and was found to have polyps in her colon, which were removed.  She required blood transfusion due to low hemoglobin level.  Patient was started on PPI treatment at that time.  She took Protonix until 5 days ago when her prescription ended. Patient is brought in today for blood in stool, going on for the past 24 hours.  Her stools were initially dark and eventually with bright red blood.  She denies abdominal pain.  No fever/chills, no nausea/vomiting. At the arrival to emergency room patient was noted with tachycardia, heart rate 104 and hypotension, blood pressure 89/60.  The heart rate and blood pressure improved after IV bolus. Blood test done emergency room are notable for low hemoglobin level at 7.8.  Creatinine is 1.69.  BUN is 41. Patient is admitted for further evaluation and  treatment.    VITAL SIGNS:  Blood pressure 108/62, pulse 75, temperature 98.8 F (37.1 C), temperature source Oral, resp. rate 16, height 4\' 11"  (1.499 m), weight 45.6 kg (100 lb 9.6 oz), SpO2 100 %.  I/O:    Intake/Output Summary (Last 24 hours) at 02/06/2018 1359 Last data filed at 02/06/2018 1019 Gross per 24 hour  Intake 360 ml  Output -  Net 360 ml    PHYSICAL EXAMINATION:  GENERAL:  72 y.o.-year-old patient lying in the bed with no acute distress.  EYES: Pupils equal, round, reactive to light and accommodation. No scleral icterus. Extraocular muscles intact.  HEENT: Head atraumatic, normocephalic. Oropharynx and nasopharynx clear.  NECK:  Supple, no jugular venous distention. No thyroid enlargement, no tenderness.  LUNGS: Normal breath sounds bilaterally, no wheezing, rales,rhonchi or crepitation. No use of accessory muscles of respiration.  CARDIOVASCULAR: S1, S2 normal. No murmurs, rubs, or gallops.  ABDOMEN: Soft, non-tender, non-distended. Bowel sounds present. No organomegaly or mass.  EXTREMITIES: No pedal edema, cyanosis, or clubbing.  NEUROLOGIC: Cranial nerves II through XII are intact. Muscle strength  5/5 in all extremities. Sensation intact. Gait not checked.  PSYCHIATRIC: The patient is alert and oriented x 3.  SKIN: No obvious rash, lesion, or ulcer.   DATA REVIEW:   CBC Recent Labs  Lab 02/06/18 0331  WBC 6.3  HGB 8.0*  HCT 23.1*  PLT 190    Chemistries  Recent Labs  Lab 02/04/18 2300 02/05/18 0348  NA 138 141  K 4.2 3.8  CL 104 114*  CO2 21* 21*  GLUCOSE 208* 89  BUN 41* 36*  CREATININE 1.69* 1.17*  CALCIUM 9.2 7.8*  AST 35  --   ALT 13  --   ALKPHOS 46  --   BILITOT 0.4  --     Cardiac Enzymes No results for input(s): TROPONINI in the last 168 hours.  Microbiology Results  Results for orders placed or performed during the hospital encounter of 12/28/17  MRSA PCR Screening     Status: None   Collection Time: 12/29/17  6:39 PM   Result Value Ref Range Status   MRSA by PCR NEGATIVE NEGATIVE Final    Comment:        The GeneXpert MRSA Assay (FDA approved for NASAL specimens only), is one component of a comprehensive MRSA colonization surveillance program. It is not intended to diagnose MRSA infection nor to guide or monitor treatment for MRSA infections. Performed at [facility], [address] [place], [place] [address]   CULTURE, BLOOD (ROUTINE X 2) w Reflex to ID Panel     Status: None   Collection Time: 01/02/18  7:20 AM  Result Value Ref Range Status   Specimen Description BLOOD LEFT ANTECUBITAL  Final   Special Requests   Final    BOTTLES DRAWN AEROBIC AND ANAEROBIC Blood Culture adequate volume   Culture   Final    NO GROWTH 5 DAYS Performed at [facility], [address] [place], [place] [address]    Report Status 01/07/2018 FINAL  Final  CULTURE, BLOOD (ROUTINE X 2) w Reflex to ID Panel     Status: None   Collection Time: 01/02/18  7:20 AM  Result Value Ref Range Status   Specimen Description BLOOD BLOOD LEFT HAND  Final   Special Requests   Final    BOTTLES DRAWN AEROBIC AND ANAEROBIC Blood Culture adequate volume   Culture   Final    NO GROWTH 5 DAYS Performed at [facility], [address], [place],  [address]    Report Status 01/07/2018 FINAL  Final    RADIOLOGY:  Dg Chest Port 1 View  Result Date: 02/05/2018 CLINICAL DATA:  72 year old female with shortness of breath and chronic cough EXAM: PORTABLE CHEST 1 VIEW COMPARISON:  Chest radiograph dated 01/02/2018 FINDINGS: The lungs are clear. There is no pleural effusion or pneumothorax. Stable top-normal cardiac size. Median sternotomy wires and CABG vascular clips. No acute osseous pathology. IMPRESSION: No active disease. Electronically Signed   By: Anner Crete M.D.   On: 02/05/2018 00:11    EKG:   Orders placed or performed during the hospital encounter of 02/04/18  . EKG 12-Lead  .  EKG 12-Lead      Management plans discussed with the patient, family and they are in agreement.  CODE STATUS:     Code Status Orders  (From admission, onward)        Start     Ordered   02/05/18 0140  Full code  Continuous     02/05/18 0139  Code Status History    Date Active Date Inactive Code Status Order ID Comments User Context   12/29/2017 0053 01/02/2018 1901 Full Code [id number]  Lance Coon, MD Inpatient      TOTAL TIME TAKING CARE OF THIS PATIENT: 35 minutes.    Vaughan Basta M.D on 02/06/2018 at 1:59 PM  Between 7am to 6pm - Pager - [phone]  After 6pm go to [email or web address] - password [email or web address] [facility]  Office  [phone]  CC: Primary care physician; [name], MD   Note: This dictation was prepared with Dragon dictation along with smaller phrase technology. Any transcriptional errors that result from this process are unintentional.

## 2018-02-07 LAB — TYPE AND SCREEN
ABO/RH(D): AB POS
Antibody Screen: NEGATIVE
UNIT DIVISION: 0
UNIT DIVISION: 0
Unit division: 0

## 2018-02-07 LAB — BPAM RBC
Blood Product Expiration Date: 201907262359
Blood Product Expiration Date: 201908122359
Blood Product Expiration Date: 201908122359
ISSUE DATE / TIME: 201907210035
ISSUE DATE / TIME: 201907211436
UNIT TYPE AND RH: 6200
Unit Type and Rh: 6200
Unit Type and Rh: 7300

## 2018-02-07 LAB — PREPARE RBC (CROSSMATCH)

## 2018-02-07 NOTE — Anesthesia Postprocedure Evaluation (Signed)
Anesthesia Post Note  Patient: In Trachelle Low  Procedure(s) Performed: ESOPHAGOGASTRODUODENOSCOPY (EGD) (Left )  Patient location during evaluation: PACU Anesthesia Type: General Level of consciousness: awake and alert Pain management: pain level controlled Vital Signs Assessment: post-procedure vital signs reviewed and stable Respiratory status: spontaneous breathing, nonlabored ventilation, respiratory function stable and patient connected to nasal cannula oxygen Cardiovascular status: blood pressure returned to baseline and stable Postop Assessment: no apparent nausea or vomiting Anesthetic complications: no     Last Vitals:  Vitals:   02/06/18 0457 02/06/18 1421  BP: 108/62 (!) 110/56  Pulse: 75 74  Resp: 16 20  Temp: 37.1 C 36.8 C  SpO2: 100% 98%    Last Pain:  Vitals:   02/06/18 1421  TempSrc: Oral  PainSc:                  Molli Barrows

## 2018-02-13 ENCOUNTER — Ambulatory Visit: Payer: Medicare Other | Admitting: Gastroenterology

## 2018-02-13 ENCOUNTER — Encounter: Payer: Self-pay | Admitting: Gastroenterology

## 2018-02-13 ENCOUNTER — Encounter (INDEPENDENT_AMBULATORY_CARE_PROVIDER_SITE_OTHER): Payer: Self-pay

## 2018-02-13 VITALS — BP 117/68 | HR 86 | Ht 59.0 in | Wt 101.4 lb

## 2018-02-13 DIAGNOSIS — Q8789 Other specified congenital malformation syndromes, not elsewhere classified: Secondary | ICD-10-CM

## 2018-02-13 DIAGNOSIS — D508 Other iron deficiency anemias: Secondary | ICD-10-CM

## 2018-02-13 DIAGNOSIS — K529 Noninfective gastroenteritis and colitis, unspecified: Secondary | ICD-10-CM | POA: Diagnosis not present

## 2018-02-13 DIAGNOSIS — K6389 Other specified diseases of intestine: Secondary | ICD-10-CM | POA: Diagnosis not present

## 2018-02-13 NOTE — Progress Notes (Signed)
[name], MD [address]  [place]  [place], [place] [address]  Main: [phone]  Fax: [phone]   Primary Care Physician: [name], MD  Primary Gastroenterologist:  Dr. [name]  Chief Complaint  Patient presents with  . Follow-up    hospital f/u GI Bleed    HPI: Judy Torres is a 72 y.o. female here for follow up of GI bleed.  No further hematochezia since hospital discharge.  No melena.  No abdominal pain, nausea or vomiting.  Was discharged on oral iron twice daily, and reports bloating, and some dysphasia with the oral iron pill itself.  She has started cutting the pill Judy half and the dysphagia has resolved, but she still has bloating from it.  Previous history: She was readmitted Judy July 2019 with hematochezia and underwent EGD that showed 2 bleeding AVMs Judy the small bowel, with scalloped/denuded mucosa underneath the bleeding sites.  It was treated with APC and her bleeding resolved.  1 month prior to that she was admitted with hematochezia.  "Was seen by Dr. Marius Ditch for inpatient consult for dark red blood per rectum with clots.  Was found to have drop Judy hemoglobin to 7 with previous hemoglobin being 12.5 prior to that.  She underwent EGD and colonoscopy during that visit.  EGD showed nonbleeding gastric erosions Judy the antrum, and was otherwise normal.  Colonoscopy showed several polyps that were removed, with the largest being 19 mm Judy the rectum as detailed below:  "- One 5 mm polyp Judy the ascending colon, removed with a cold snare. Resected and retrieved. - Two 7 mm polyps Judy the transverse colon, removed with a hot snare. Resected and retrieved. - Five 5 to 7 mm polyps Judy the descending colon, removed with a hot snare. Resected and retrieved. - One 7 mm polyp Judy the sigmoid colon, removed with a hot snare. Resected and retrieved. - One 8 mm polyp at the recto-sigmoid colon, removed with a hot snare. Resected and retrieved. -  One 19 mm polyp Judy the rectum, removed with a hot snare. Resected and retrieved. - Moderate diverticulosis Judy the entire examined colon. There was no evidence of diverticular bleeding. - Non-bleeding internal hemorrhoids. - Yellow stool Judy colon, no fresh or old blood seen"  She subsequently had hematochezia after her colonoscopy and underwent flex sig which showed a rectal ulcer at the site of the rectal polypectomy, and blood Judy the rectum, and clips were placed by Dr. Vicente Males.    Pathology from the EGD and colonoscopy as below:  "DIAGNOSIS:  A. RANDOM STOMACH; COLD BIOPSY:  - ANTRAL AND OXYNTIC MUCOSA WITH MINIMAL TO MILD CHRONIC GASTRITIS.  - REGENERATIVE/REPARATIVE CHANGE.  - NEGATIVE FOR H. PYLORI, DYSPLASIA AND MALIGNANCY.   B. COLON POLYP X2, TRANSVERSE; HOT SNARE:  - TUBULAR ADENOMAS (2).  - NEGATIVE FOR HIGH-GRADE DYSPLASIA AND MALIGNANCY.   C. COLON POLYP X1; COLD BIOPSY, COLON POLYP X5; HOT SNARE, COLON POLYP  X1, DESCENDING; COLD SNARE:  - TUBULAR ADENOMAS (6 FRAGMENTS), NEGATIVE FOR HIGH-GRADE DYSPLASIA AND  MALIGNANCY.  - PROMINENT LYMPHOID AGGREGATE (1).   D. COLON POLYP, SIGMOID; HOT SNARE:  -TUBULAR ADENOMA.  - NEGATIVE FOR HIGH-GRADE DYSPLASIA AND MALIGNANCY.   E. COLON POLYP, RECTOSIGMOID; HOT SNARE:  - TUBULAR ADENOMA.  - NEGATIVE FOR HIGH-GRADE DYSPLASIA AND MALIGNANCY.   F. RECTUM POLYP; HOT SNARE:  - TUBULOVILLOUS ADENOMA.  - NEGATIVE FOR HIGH-GRADE DYSPLASIA AND MALIGNANCY.  - THE INKED BASE APPEARS CLEAR. "  Current Outpatient Medications  Medication Sig Dispense Refill  . alendronate (FOSAMAX) 70 MG tablet Take 70 mg by mouth once a week. Saturdays    . ezetimibe-simvastatin (VYTORIN) 10-40 MG per tablet Take 1 tablet by mouth daily.    . feeding supplement, ENSURE ENLIVE, (ENSURE ENLIVE) LIQD Take 237 mLs by mouth 2 (two) times daily between meals. 90 Bottle 0  . ferrous sulfate 325 (65 FE) MG tablet Take 1 tablet (325 mg total) by  mouth 2 (two) times daily with a meal. 60 tablet 3  . hydroxychloroquine (PLAQUENIL) 200 MG tablet TAKE 1 TABLET BY MOUTH TWICE A DAY    . Multiple Vitamin (MULTIVITAMIN WITH MINERALS) TABS tablet Take 1 tablet by mouth daily. 180 tablet 0  . pantoprazole (PROTONIX) 40 MG tablet Take 1 tablet (40 mg total) by mouth 2 (two) times daily before a meal. 60 tablet 1  . predniSONE (DELTASONE) 5 MG tablet Take 5 mg by mouth.     No current facility-administered medications for this visit.     Allergies as of 02/13/2018 - Review Complete 02/13/2018  Allergen Reaction Noted  . Penicillins Other (See Comments) 02/28/2014  . Sulfa antibiotics Nausea Only 02/28/2014    ROS:  General: Negative for anorexia, weight loss, fever, chills, fatigue, weakness. ENT: Negative for hoarseness, difficulty swallowing , nasal congestion. CV: Negative for chest pain, angina, palpitations, dyspnea on exertion, peripheral edema.  Respiratory: Negative for dyspnea at rest, dyspnea on exertion, cough, sputum, wheezing.  GI: See history of present illness. GU:  Negative for dysuria, hematuria, urinary incontinence, urinary frequency, nocturnal urination.  Endo: Negative for unusual weight change.    Physical Examination:   BP 117/68   Pulse 86   Ht 4\' 11"  (1.499 m)   Wt 101 lb 6.4 oz (46 kg)   BMI 20.48 kg/m   General: Well-nourished, well-developed Judy no acute distress.  Eyes: No icterus. Conjunctivae pink. Mouth: Oropharyngeal mucosa moist and pink , no lesions erythema or exudate. Neck: Supple, Trachea midline Abdomen: Bowel sounds are normal, nontender, nondistended, no hepatosplenomegaly or masses, no abdominal bruits or hernia , no rebound or guarding.   Extremities: No lower extremity edema. No clubbing or deformities. Neuro: Alert and oriented x 3.  Grossly intact. Skin: Warm and dry, no jaundice.   Psych: Alert and cooperative, normal mood and affect.   Labs: CMP     Component Value Date/Time    NA 141 02/05/2018 0348   K 3.8 02/05/2018 0348   CL 114 (H) 02/05/2018 0348   CO2 21 (L) 02/05/2018 0348   GLUCOSE 89 02/05/2018 0348   BUN 36 (H) 02/05/2018 0348   CREATININE 1.17 (H) 02/05/2018 0348   CREATININE 1.05 12/15/2011 1221   CALCIUM 7.8 (L) 02/05/2018 0348   PROT 5.3 (L) 02/04/2018 2300   ALBUMIN 3.0 (L) 02/04/2018 2300   AST 35 02/04/2018 2300   ALT 13 02/04/2018 2300   ALKPHOS 46 02/04/2018 2300   BILITOT 0.4 02/04/2018 2300   GFRNONAA 46 (L) 02/05/2018 0348   GFRNONAA 56 (L) 12/15/2011 1221   GFRAA 53 (L) 02/05/2018 0348   GFRAA >60 12/15/2011 1221   Lab Results  Component Value Date   WBC 6.3 02/06/2018   HGB 8.0 (L) 02/06/2018   HCT 23.1 (L) 02/06/2018   MCV 93.3 02/06/2018   PLT 190 02/06/2018    Imaging Studies: Dg Chest Port 1 View  Result Date: 02/05/2018 CLINICAL DATA:  72 year old female with shortness of breath and chronic cough  EXAM: PORTABLE CHEST 1 VIEW COMPARISON:  Chest radiograph dated 01/02/2018 FINDINGS: The lungs are clear. There is no pleural effusion or pneumothorax. Stable top-normal cardiac size. Median sternotomy wires and CABG vascular clips. No acute osseous pathology. IMPRESSION: No active disease. Electronically Signed   By: Anner Crete M.D.   On: 02/05/2018 00:11    Assessment and Plan:   Judy Judy Torres is a 72 y.o. y/o female with 2 admissions for hematochezia, Judy June and July 2019, with initial EGD and colonoscopy Judy June 2019 unrevealing of source of hematochezia, but several polyps removed from the colon, with subsequent hematochezia from rectal polypectomy site, requiring flex sig and Hemoclip placement, readmission Judy July 2019 for hematochezia, with 2 areas of scalloped mucosa Judy the duodenum with bleeding seen from the sites  Hematochezia resolved at this time We will recheck labs, and ferritin Patient will need repeat EGD for reevaluation of the sites and biopsies.  We will schedule for 4 to 6 weeks I have  discussed alternative options, risks & benefits,  which include, but are not limited to, bleeding, infection, perforation,respiratory complication & drug reaction.  The patient agrees with this plan & written consent will be obtained.    We will also order serology and testing for villous atrophy, as EGD showed areas of scalloped mucosa.  Patient had some dysphagia with iron pill only, no other dysphagia with any other pills or food or liquids Since she has started cutting this Judy half the dysphagia with the pill has resolved But she still has bloating Depending on if iron labs still show iron deficiency, can refer to hematology for IV iron given that she may not be tolerating the oral iron well  We will continue Protonix, but reduce dose to Protonix 20 mg twice daily, as no ulcerations were seen at the site, but patient has several risk factors that make PPI more benefits than risks Judy this patient.  She is on Fosamax that can cause esophagitis, is also on prednisone and hydroxychloroquine.   (Risks of PPI use were discussed with patient including bone loss, C. Diff diarrhea, pneumonia, infections, CKD, electrolyte abnormalities.  If clinically possible based on symptoms, goal would be to maintain patient on the lowest dose possible, or discontinue the medication with institution of acid reflux lifestyle modifications over time. Pt. Verbalizes understanding and chooses to continue the medication.)     Dr [name]

## 2018-02-13 NOTE — Patient Instructions (Addendum)
F/U 2 weeks after EGD done on 03/15/18

## 2018-02-14 ENCOUNTER — Telehealth: Payer: Self-pay

## 2018-02-14 DIAGNOSIS — Q8789 Other specified congenital malformation syndromes, not elsewhere classified: Secondary | ICD-10-CM

## 2018-02-14 DIAGNOSIS — K529 Noninfective gastroenteritis and colitis, unspecified: Secondary | ICD-10-CM

## 2018-02-14 DIAGNOSIS — D508 Other iron deficiency anemias: Secondary | ICD-10-CM

## 2018-02-14 DIAGNOSIS — K6389 Other specified diseases of intestine: Secondary | ICD-10-CM

## 2018-02-14 NOTE — Telephone Encounter (Signed)
-----   Message from Virgel Manifold, MD sent at 02/14/2018  9:45 AM EDT ----- Jackelyn Poling please refer patient to hematology for "Anemia, low RBC, and bloating with oral iron". Let her know her Hemoglobin is better but still low. Her other labs are still pending

## 2018-02-15 LAB — CBC
HEMATOCRIT: 25.3 % — AB (ref 34.0–46.6)
HEMOGLOBIN: 8.3 g/dL — AB (ref 11.1–15.9)
MCH: 30.6 pg (ref 26.6–33.0)
MCHC: 32.8 g/dL (ref 31.5–35.7)
MCV: 93 fL (ref 79–97)
Platelets: 304 10*3/uL (ref 150–450)
RBC: 2.71 x10E6/uL — CL (ref 3.77–5.28)
RDW: 13.9 % (ref 12.3–15.4)
WBC: 8.7 10*3/uL (ref 3.4–10.8)

## 2018-02-15 LAB — GLIA (IGA/G) + TTG IGA
Antigliadin Abs, IgA: 3 units (ref 0–19)
Gliadin IgG: 2 units (ref 0–19)
Transglutaminase IgA: <2 U/mL (ref 0–3)

## 2018-02-15 LAB — FERRITIN: Ferritin: 93 ng/mL (ref 15–150)

## 2018-02-15 LAB — IRON AND TIBC
Iron Saturation: 16 % (ref 15–55)
Iron: 43 ug/dL (ref 27–139)
Total Iron Binding Capacity: 270 ug/dL (ref 250–450)
UIBC: 227 ug/dL (ref 118–369)

## 2018-02-15 LAB — ENDOMYSIAL IGA ANTIBODY: Endomysial IgA: NEGATIVE

## 2018-02-15 LAB — TISSUE TRANSGLUTAMINASE ABS,IGG,IGA

## 2018-02-15 LAB — E. HISTOLYTICA ANTIBODY (AMOEBA AB): E histolytica Ab: NEGATIVE

## 2018-02-15 LAB — IGG: IgG (Immunoglobin G), Serum: 433 mg/dL — ABNORMAL LOW (ref 700–1600)

## 2018-02-15 LAB — CELIAC AB TTG DGP TIGA: IGA/IMMUNOGLOBULIN A, SERUM: 99 mg/dL (ref 87–352)

## 2018-02-15 LAB — STRONGYLOIDES, AB, IGG: Strongyloides, Ab, IgG: NEGATIVE

## 2018-02-16 ENCOUNTER — Other Ambulatory Visit: Payer: Self-pay

## 2018-02-16 DIAGNOSIS — D508 Other iron deficiency anemias: Secondary | ICD-10-CM

## 2018-02-16 DIAGNOSIS — K6389 Other specified diseases of intestine: Secondary | ICD-10-CM

## 2018-02-16 DIAGNOSIS — Q8789 Other specified congenital malformation syndromes, not elsewhere classified: Secondary | ICD-10-CM

## 2018-02-16 DIAGNOSIS — K529 Noninfective gastroenteritis and colitis, unspecified: Secondary | ICD-10-CM

## 2018-02-20 LAB — CRYPTOSPORIDIUM/ISOSPORA SMEAR
Cryptosporidium Smear, Stool: NONE SEEN
ISOSPORA SMEAR, STOOL: NONE SEEN

## 2018-02-20 LAB — GIARDIA, EIA; OVA/PARASITE: Giardia Ag, Stl: NEGATIVE

## 2018-02-20 LAB — CYCLOSPORA SMEAR, STOOL: CYCLOSPORA SMEAR, STOOL: NONE SEEN

## 2018-03-02 ENCOUNTER — Telehealth: Payer: Self-pay | Admitting: Gastroenterology

## 2018-03-02 NOTE — Telephone Encounter (Signed)
Pt is calling back for [facility]

## 2018-03-07 ENCOUNTER — Other Ambulatory Visit: Payer: Self-pay

## 2018-03-07 ENCOUNTER — Inpatient Hospital Stay: Payer: Medicare Other

## 2018-03-07 ENCOUNTER — Inpatient Hospital Stay: Payer: Medicare Other | Attending: Hematology and Oncology | Admitting: Hematology and Oncology

## 2018-03-07 ENCOUNTER — Encounter: Payer: Self-pay | Admitting: Hematology and Oncology

## 2018-03-07 ENCOUNTER — Other Ambulatory Visit: Payer: Self-pay | Admitting: Hematology and Oncology

## 2018-03-07 ENCOUNTER — Encounter (INDEPENDENT_AMBULATORY_CARE_PROVIDER_SITE_OTHER): Payer: Self-pay

## 2018-03-07 VITALS — BP 133/82 | HR 93 | Temp 97.9°F | Resp 18 | Ht 59.0 in | Wt 100.9 lb

## 2018-03-07 DIAGNOSIS — E538 Deficiency of other specified B group vitamins: Secondary | ICD-10-CM

## 2018-03-07 DIAGNOSIS — D538 Other specified nutritional anemias: Secondary | ICD-10-CM

## 2018-03-07 DIAGNOSIS — N189 Chronic kidney disease, unspecified: Secondary | ICD-10-CM | POA: Diagnosis not present

## 2018-03-07 DIAGNOSIS — R7989 Other specified abnormal findings of blood chemistry: Secondary | ICD-10-CM | POA: Diagnosis not present

## 2018-03-07 DIAGNOSIS — Z79899 Other long term (current) drug therapy: Secondary | ICD-10-CM | POA: Insufficient documentation

## 2018-03-07 DIAGNOSIS — Z7952 Long term (current) use of systemic steroids: Secondary | ICD-10-CM | POA: Diagnosis not present

## 2018-03-07 DIAGNOSIS — D125 Benign neoplasm of sigmoid colon: Secondary | ICD-10-CM | POA: Diagnosis not present

## 2018-03-07 DIAGNOSIS — D500 Iron deficiency anemia secondary to blood loss (chronic): Secondary | ICD-10-CM | POA: Insufficient documentation

## 2018-03-07 DIAGNOSIS — M329 Systemic lupus erythematosus, unspecified: Secondary | ICD-10-CM | POA: Insufficient documentation

## 2018-03-07 DIAGNOSIS — I129 Hypertensive chronic kidney disease with stage 1 through stage 4 chronic kidney disease, or unspecified chronic kidney disease: Secondary | ICD-10-CM | POA: Diagnosis not present

## 2018-03-07 DIAGNOSIS — Q2733 Arteriovenous malformation of digestive system vessel: Secondary | ICD-10-CM

## 2018-03-07 DIAGNOSIS — K254 Chronic or unspecified gastric ulcer with hemorrhage: Secondary | ICD-10-CM | POA: Diagnosis not present

## 2018-03-07 DIAGNOSIS — D509 Iron deficiency anemia, unspecified: Secondary | ICD-10-CM | POA: Diagnosis present

## 2018-03-07 DIAGNOSIS — Z87891 Personal history of nicotine dependence: Secondary | ICD-10-CM | POA: Insufficient documentation

## 2018-03-07 DIAGNOSIS — D649 Anemia, unspecified: Secondary | ICD-10-CM | POA: Insufficient documentation

## 2018-03-07 DIAGNOSIS — D122 Benign neoplasm of ascending colon: Secondary | ICD-10-CM | POA: Insufficient documentation

## 2018-03-07 DIAGNOSIS — K219 Gastro-esophageal reflux disease without esophagitis: Secondary | ICD-10-CM | POA: Diagnosis not present

## 2018-03-07 LAB — IRON AND TIBC
Iron: 34 ug/dL (ref 28–170)
Saturation Ratios: 9 % — ABNORMAL LOW (ref 10.4–31.8)
TIBC: 386 ug/dL (ref 250–450)
UIBC: 352 ug/dL

## 2018-03-07 LAB — COMPREHENSIVE METABOLIC PANEL
ALT: 16 U/L (ref 0–44)
ANION GAP: 12 (ref 5–15)
AST: 30 U/L (ref 15–41)
Albumin: 4.2 g/dL (ref 3.5–5.0)
Alkaline Phosphatase: 67 U/L (ref 38–126)
BUN: 32 mg/dL — ABNORMAL HIGH (ref 8–23)
CO2: 26 mmol/L (ref 22–32)
CREATININE: 1.51 mg/dL — AB (ref 0.44–1.00)
Calcium: 9.7 mg/dL (ref 8.9–10.3)
Chloride: 100 mmol/L (ref 98–111)
GFR calc non Af Amer: 34 mL/min — ABNORMAL LOW (ref >60)
GFR, EST AFRICAN AMERICAN: 39 mL/min — AB (ref >60)
Glucose, Bld: 107 mg/dL — ABNORMAL HIGH (ref 70–99)
Potassium: 3.4 mmol/L — ABNORMAL LOW (ref 3.5–5.1)
Sodium: 138 mmol/L (ref 135–145)
Total Bilirubin: 0.5 mg/dL (ref 0.3–1.2)
Total Protein: 6.9 g/dL (ref 6.5–8.1)

## 2018-03-07 LAB — C-REACTIVE PROTEIN: CRP: 1.7 mg/dL — ABNORMAL HIGH (ref <1.0)

## 2018-03-07 LAB — CBC WITH DIFFERENTIAL/PLATELET
Basophils Absolute: 0.2 10*3/uL — ABNORMAL HIGH (ref 0–0.1)
Basophils Relative: 2 %
Eosinophils Absolute: 0.3 10*3/uL (ref 0–0.7)
Eosinophils Relative: 3 %
HCT: 34.3 % — ABNORMAL LOW (ref 35.0–47.0)
Hemoglobin: 11.4 g/dL — ABNORMAL LOW (ref 12.0–16.0)
LYMPHS ABS: 1.2 10*3/uL (ref 1.0–3.6)
Lymphocytes Relative: 13 %
MCH: 31.7 pg (ref 26.0–34.0)
MCHC: 33.2 g/dL (ref 32.0–36.0)
MCV: 95.5 fL (ref 80.0–100.0)
Monocytes Absolute: 0.9 10*3/uL (ref 0.2–0.9)
Monocytes Relative: 9 %
Neutro Abs: 6.9 10*3/uL — ABNORMAL HIGH (ref 1.4–6.5)
Neutrophils Relative %: 73 %
Platelets: 304 10*3/uL (ref 150–440)
RBC: 3.59 MIL/uL — AB (ref 3.80–5.20)
RDW: 14.9 % — ABNORMAL HIGH (ref 11.5–14.5)
WBC: 9.5 10*3/uL (ref 3.6–11.0)

## 2018-03-07 LAB — FOLATE: Folate: 78 ng/mL (ref >5.9)

## 2018-03-07 LAB — RETICULOCYTES
RBC.: 3.55 MIL/uL — ABNORMAL LOW (ref 3.80–5.20)
Retic Count, Absolute: 81.7 10*3/uL (ref 19.0–183.0)
Retic Ct Pct: 2.3 % (ref 0.4–3.1)

## 2018-03-07 LAB — SEDIMENTATION RATE: SED RATE: 34 mm/h — AB (ref 0–30)

## 2018-03-07 LAB — DAT, POLYSPECIFIC AHG (ARMC ONLY): POLYSPECIFIC AHG TEST: NEGATIVE

## 2018-03-07 LAB — TSH: TSH: 8.529 u[IU]/mL — AB (ref 0.350–4.500)

## 2018-03-07 LAB — FERRITIN: Ferritin: 57 ng/mL (ref 11–307)

## 2018-03-07 NOTE — Progress Notes (Signed)
Here for new pt evaluation.  

## 2018-03-07 NOTE — Patient Instructions (Signed)
  Please take vitamin B12    1000 mcg by mouth once day.

## 2018-03-07 NOTE — Progress Notes (Signed)
[facility] day:  03/07/2018  Chief Complaint: [name] is a 72 y.o. female with iron deficiency anemia who is referred in consultation by Dr. [name] for assessment and management.  HPI: The patient was admitted to [facility] from 12/28/2017 - 01/02/2018 with rectal bleeding.  Hemoglobin had dropped from 12.5 to 6.9.  She received 3 units of PRBCs.  Discharge hemoglobin was 8.1.  She wwas treated with a PPI.  She was briefly in the ICU.  She underwent EGD and colonoscopy on 12/30/2017 by Dr. Marius Ditch. EGD revealed non bleeding gastric erosions in the antrum.  Colonoscopy revealed numerous (one 5 mm in ascending colon, two 7 mm polyps in transverse colon, five 5-7 mm polyps in the descending colon, one 7 mm polyp in the sigmoid colon, one 8 mm polyp in the recto-sigmoid colon, and one 19 mm polyp in the rectum). 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    She developed hematochezia post colonoscopy.  Flexible sigmoidoscopy on 12/31/2017 by Dr. Vicente Males revealed a rectal ulcer at the site of the polypectomy and blood in the rectum.  Clips were placed.  She was admitted to [facility] from 02/04/2018 - 02/06/2018 with hematochezia.  She was treated with a PPI.  Hemoglobin was 7.8 on admission.  She was transfused with 1 unit of PRBCs.  Discharge hemoglobin was 8.0.  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    She was placed on oral iron.  Oral iron has caused dysphagia and bloating.  She states that oral iron tablets feels like a "knot in her upper chest".  She is able to take 1/2 pill twice a day.  Labs on 02/13/2018 revealed a hematocrit of 25.3, hemoglobin 8.3, MCV 93, platelets 304,000, WBC 8700.  Ferritin was 93.  Iron saturation was 16% and TIBC 270.  Labs on 12/29/2017 revealed the following:  B12 was 254.  Folate  6.3 (> 5.9).  Ferritin was 30.  Iron saturation was 19% with a TIBC of 285.    CBC on 02/27/2018 revealed a hematocrit of 32.8, hemoglobin 10.1, and MCV 100.9.  Creatinine was 1.4 (CrCl 37 ml/min) on 03/06/2018.  Diet is modest.  She eats chicken 3x/week.  She eats fruit and green leafy vegetables.  She craves sweets.  She denies ice pica.  She denies restless legs.  She was diagnosed with lupus in 1982.  She is followed by Dr. Precious Reel.  She is on prednisone 5 mg a day and Plaquenil.  She has had renal issues.  She denies any joint issues.  Symptomatically, she feels 80% normal.  She denies any melena, hematochezia, hematuria or vaginal bleeding.  Stools are dark on oral iron.   Past Medical History:  Diagnosis Date  . GERD (gastroesophageal reflux disease)   . HTN (hypertension)   . Lupus [facility])     Past Surgical History:  Procedure Laterality Date  . CARDIAC SURGERY    . CESAREAN SECTION    . COLONOSCOPY N/A 12/30/2017   Procedure: COLONOSCOPY;  Surgeon: Lin Landsman, MD;  Location: [facility] ENDOSCOPY;  Service: Gastroenterology;  Laterality: N/A;  . ESOPHAGOGASTRODUODENOSCOPY N/A 12/30/2017   Procedure: ESOPHAGOGASTRODUODENOSCOPY (EGD);  Surgeon: Lin Landsman, MD;  Location: [facility] ENDOSCOPY;  Service: Gastroenterology;  Laterality: N/A;  . ESOPHAGOGASTRODUODENOSCOPY Left 02/05/2018   Procedure: ESOPHAGOGASTRODUODENOSCOPY (EGD);  Surgeon: Virgel Manifold, MD;  Location:  [facility] ENDOSCOPY;  Service: Endoscopy;  Laterality: Left;  . FLEXIBLE SIGMOIDOSCOPY N/A 12/31/2017   Procedure: FLEXIBLE SIGMOIDOSCOPY;  Surgeon: Jonathon Bellows, MD;  Location: [facility] ENDOSCOPY;  Service: Gastroenterology;  Laterality: N/A;    Family History  Problem Relation Age of Onset  . Hypertension Mother   . Stroke Mother   . Vascular Disease Mother   . Hypertension Sister     Social History:  reports that she quit smoking about 16 years ago. Her smoking use included cigarettes. She has a  10.00 pack-year smoking history. She has never used smokeless tobacco. She reports that she drank alcohol. She reports that she has current or past drug history.  She has smoked 1/2 pack/day x 20 years.  She stopped smoking in 2000.  She denies any exposure to radiation or toxins.  She is from [place].  She moved to the [place] 52 years ago after marrying her husband.  He was in the [employer].  The patient is accompanied by her husband today.  Allergies:  Allergies  Allergen Reactions  . Penicillins Other (See Comments)    Pass out  . Sulfa Antibiotics Nausea Only  . Sulfasalazine Other (See Comments)    Other reaction(s): NOT KNOWN   . Ace Inhibitors Nausea Only  . Losartan Anxiety    Other reaction(s): OTHER Other reaction(s): OTHER   . Spironolactone Other (See Comments)    Felt poorly but couldn't clarify Felt poorly but couldn't clarify     Current Medications: Current Outpatient Medications  Medication Sig Dispense Refill  . alendronate (FOSAMAX) 70 MG tablet Take 70 mg by mouth once a week. Saturdays    . amLODipine (NORVASC) 5 MG tablet Take by mouth.    . clotrimazole-betamethasone (LOTRISONE) cream APPLY TOPICALLY TWICE A DAY AS NEEDED    . ezetimibe-simvastatin (VYTORIN) 10-40 MG per tablet Take 1 tablet by mouth daily.    . feeding supplement, ENSURE ENLIVE, (ENSURE ENLIVE) LIQD Take 237 mLs by mouth 2 (two) times daily between meals. 90 Bottle 0  . ferrous sulfate 325 (65 FE) MG tablet Take 1 tablet (325 mg total) by mouth 2 (two) times daily with a meal. 60 tablet 3  . hydrochlorothiazide (HYDRODIURIL) 25 MG tablet Take by mouth.    . hydroxychloroquine (PLAQUENIL) 200 MG tablet TAKE 1 TABLET BY MOUTH TWICE A DAY    . Multiple Vitamin (MULTIVITAMIN WITH MINERALS) TABS tablet Take 1 tablet by mouth daily. 180 tablet 0  . pantoprazole (PROTONIX) 40 MG tablet Take 1 tablet (40 mg total) by mouth 2 (two) times daily before a meal. 60 tablet 1  . polyethylene glycol  (MIRALAX / GLYCOLAX) packet Take 17 g by mouth daily.    . predniSONE (DELTASONE) 5 MG tablet Take 5 mg by mouth.    . simvastatin (ZOCOR) 40 MG tablet Take by mouth.    . traMADol (ULTRAM) 50 MG tablet Take by mouth.    . oxyCODONE-acetaminophen (PERCOCET/ROXICET) 5-325 MG tablet TAKE 1 TABLET BY MOUTH 6 HOURS AS NEEDED FOR PAIN  0  . Pseudoephedrine-APAP 30-325 MG TABS Take by mouth.    . ranitidine (ZANTAC) 150 MG tablet Take by mouth.     No current facility-administered medications for this visit.     Review of Systems:  GENERAL:  Feels "better".  Energy level 80%.  No fevers, sweats or weight loss. PERFORMANCE STATUS (ECOG):  1 HEENT:  Sinus drainage.  No visual changes, sore throat, mouth sores or tenderness. Lungs: No shortness of  breath.  Chronic cough.  No hemoptysis. Cardiac:  No chest pain, palpitations, orthopnea, or PND. GI:  No nausea, vomiting, diarrhea, constipation, melena or hematochezia.  Stool dark on oral iron. GU:  No urgency, frequency, dysuria, or hematuria. Musculoskeletal:  No back pain.  No joint pain.  No muscle tenderness. Extremities:  No pain or swelling. Skin:  No rashes or skin changes. Neuro:  No headache, numbness or weakness, balance or coordination issues. Endocrine:  No diabetes, thyroid issues, hot flashes or night sweats. Psych:  No mood changes, depression or anxiety. Pain:  No focal pain. Review of systems:  All other systems reviewed and found to be negative.  Physical Exam: Blood pressure 133/82, pulse 93, temperature 97.9 F (36.6 C), temperature source Tympanic, resp. rate 18, height _0  (1.499 m), weight 100 lb 14.4 oz (45.8 kg). GENERAL:  Well developed, well nourished, woman sitting comfortably in the exam room in no acute distress. MENTAL STATUS:  Alert and oriented to person, place and time. HEAD:  Short dark curly hair.  Normocephalic, atraumatic, face symmetric, no Cushingoid features. EYES:  Glasses.  Brown eyes.  Pupils  equal round and reactive to light and accomodation.  No conjunctivitis or scleral icterus. ENT:  Oropharynx clear without lesion.  Tongue normal.  Dentures.  Mucous membranes moist.  RESPIRATORY:  Clear to auscultation without rales, wheezes or rhonchi. CARDIOVASCULAR:  Regular rate and rhythm without murmur, rub or gallop. ABDOMEN:  Soft, non-tender, with active bowel sounds, and no hepatosplenomegaly.  No masses. SKIN:  Tan complexion.  No rashes, ulcers or lesions. EXTREMITIES: No edema, no skin discoloration or tenderness.  No palpable cords. LYMPH NODES: No palpable cervical, supraclavicular, axillary or inguinal adenopathy  NEUROLOGICAL: Unremarkable. PSYCH:  Appropriate.   Appointment on 03/07/2018  Component Date Value Ref Range Status  . Iron 03/07/2018 34  28 - 170 ug/dL Final  . TIBC 03/07/2018 386  250 - 450 ug/dL Final  . Saturation Ratios 03/07/2018 9* 10.4 - 31.8 % Final  . UIBC 03/07/2018 352  ug/dL Final   Performed at [facility], [address] [place], [place] [address]  . TSH 03/07/2018 8.529* 0.350 - 4.500 uIU/mL Final   Comment: Performed by a 3rd Generation assay with a functional sensitivity of <=0.01 uIU/mL. Performed at [facility], [address] [place], [place] [address]   . Total Protein ELP 03/07/2018 6.3  6.0 - 8.5 g/dL Final  . Albumin ELP 03/07/2018 3.5  2.9 - 4.4 g/dL Final  . Alpha-1-Globulin 03/07/2018 0.3  0.0 - 0.4 g/dL Final  . Alpha-2-Globulin 03/07/2018 0.9  0.4 - 1.0 g/dL Final  . Beta Globulin 03/07/2018 0.9  0.7 - 1.3 g/dL Final  . Gamma Globulin 03/07/2018 0.6  0.4 - 1.8 g/dL Final  . M-Spike, % 03/07/2018 Not Observed  Not Observed g/dL Final  . SPE Interp. 03/07/2018 Comment   Final   Comment: (NOTE) The SPE pattern appears essentially unremarkable. Evidence of monoclonal protein is not apparent. Performed At: [facility] [place], [place] [id number] Rush Farmer MD [id number]    . Comment 03/07/2018 Comment   Final   Comment: (NOTE) Protein electrophoresis scan will follow via computer, mail, or courier delivery.   Marland Kitchen GLOBULIN, TOTAL 03/07/2018 2.8  2.2 - 3.9 g/dL Corrected  . A/G Ratio 03/07/2018 1.3  0.7 - 1.7 Corrected  . Kappa free light chain 03/07/2018 29.6* 3.3 - 19.4 mg/L Final  . Lamda free light chains 03/07/2018 27.2* 5.7 -  26.3 mg/L Final  . Kappa, lamda light chain ratio 03/07/2018 1.09  0.26 - 1.65 Final   Comment: (NOTE) Performed At: [facility] [place], [place] [id number] Rush Farmer MD OI:[id number]   . CRP 03/07/2018 1.7* <1.0 mg/dL Final   Performed at [facility] [address] [place], [place] [address]  . Sed Rate 03/07/2018 34* 0 - 30 mm/hr Final   Performed at [facility], [address] [place], [place] [address]  . Ferritin 03/07/2018 57  11 - 307 ng/mL Final   Performed at [facility], [address] [place], [place] [address]  . Folate 03/07/2018 78.0  >5.9 ng/mL Final   Comment: RESULT CONFIRMED BY MANUAL DILUTION AKT Performed at [facility], [address] [place], [place] [address]   . Polyspecific AHG test 03/07/2018    Final                   Value:NEG Performed at [facility], [address] [place], [place] [address]   . Retic Ct Pct 03/07/2018 2.3  0.4 - 3.1 % Final  . RBC. 03/07/2018 3.55* 3.80 - 5.20 MIL/uL Final  . Retic Count, Absolute 03/07/2018 81.7  19.0 - 183.0 K/uL Final   Performed at [facility], [address] [place], [place] [address]  . WBC 03/07/2018 9.5  3.6 - 11.0 K/uL Final  . RBC 03/07/2018 3.59* 3.80 - 5.20 MIL/uL Final  . Hemoglobin 03/07/2018 11.4* 12.0 - 16.0 g/dL Final  . HCT 03/07/2018 34.3* 35.0 - 47.0 % Final  . MCV 03/07/2018 95.5  80.0 - 100.0 fL Final  . MCH 03/07/2018 31.7  26.0 - 34.0 pg Final  . MCHC 03/07/2018 33.2  32.0 - 36.0 g/dL Final  . RDW 03/07/2018 14.9* 11.5 - 14.5 % Final   . Platelets 03/07/2018 304  150 - 440 K/uL Final  . Neutrophils Relative % 03/07/2018 73  % Final  . Neutro Abs 03/07/2018 6.9* 1.4 - 6.5 K/uL Final  . Lymphocytes Relative 03/07/2018 13  % Final  . Lymphs Abs 03/07/2018 1.2  1.0 - 3.6 K/uL Final  . Monocytes Relative 03/07/2018 9  % Final  . Monocytes Absolute 03/07/2018 0.9  0.2 - 0.9 K/uL Final  . Eosinophils Relative 03/07/2018 3  % Final  . Eosinophils Absolute 03/07/2018 0.3  0 - 0.7 K/uL Final  . Basophils Relative 03/07/2018 2  % Final  . Basophils Absolute 03/07/2018 0.2* 0 - 0.1 K/uL Final   Performed at [facility], [address] [place], [place] [address]  . Sodium 03/07/2018 138  135 - 145 mmol/L Final  . Potassium 03/07/2018 3.4* 3.5 - 5.1 mmol/L Final  . Chloride 03/07/2018 100  98 - 111 mmol/L Final  . CO2 03/07/2018 26  22 - 32 mmol/L Final  . Glucose, Bld 03/07/2018 107* 70 - 99 mg/dL Final  . BUN 03/07/2018 32* 8 - 23 mg/dL Final  . Creatinine, Ser 03/07/2018 1.51* 0.44 - 1.00 mg/dL Final  . Calcium 03/07/2018 9.7  8.9 - 10.3 mg/dL Final  . Total Protein 03/07/2018 6.9  6.5 - 8.1 g/dL Final  . Albumin 03/07/2018 4.2  3.5 - 5.0 g/dL Final  . AST 03/07/2018 30  15 - 41 U/L Final  . ALT 03/07/2018 16  0 - 44 U/L Final  . Alkaline Phosphatase 03/07/2018 67  38 - 126 U/L Final  . Total Bilirubin 03/07/2018 0.5  0.3 - 1.2  mg/dL Final  . GFR calc non Af Amer 03/07/2018 34* >60 mL/min Final  . GFR calc Af Amer 03/07/2018 39* >60 mL/min Final   Comment: (NOTE) The eGFR has been calculated using the CKD EPI equation. This calculation has not been validated in all clinical situations. eGFR's persistently <60 mL/min signify possible Chronic Kidney Disease.   Georgiann Hahn gap 03/07/2018 12  5 - 15 Final   Performed at [facility], [address] [place], [place] [address]  . Free T4 03/07/2018 0.85  0.82 - 1.77 ng/dL Final   Comment: (NOTE) Biotin ingestion may interfere with free T4 tests. If the results  are inconsistent with the TSH level, previous test results, or the clinical presentation, then consider biotin interference. If needed, order repeat testing after stopping biotin. Performed at [facility], [address], [place], [place] [address]     Assessment:  [name] is a 72 y.o. female with a normocytic anemia.  She was admitted x 2 (12/2017 and 01/2018) with rectal bleeding.  She is intolerant of oral iron.  Diet is modest.  EGD on 12/30/2017 revealed non bleeding gastric erosions in the antrum.  Colonoscopy on 12/30/2017 revealed numerous polyps (one 5 mm in ascending colon, two 7 mm polyps in transverse colon, five 5-7 mm polyps in the descending colon, one 7 mm polyp in the sigmoid colon, one 8 mm polyp in the recto-sigmoid colon, and one 19 mm polyp in the rectum). 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    She developed hematochezia post colonoscopy.  Flexible sigmoidoscopy on 12/31/2017 revealed a rectal ulcer at the site of the polypectomy and blood in the rectum.  Clips were placed.  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  She has required 4 units of PRBCs (3 units in 12/2017 and 1 unit in 01/2018).  Ferritin has been followed: 30 on 12/29/2017, 93 on 02/13/2018, and 57 on 03/07/2018.  Labs on 12/29/2017 revealed the following:  B12 was 254 (low normal).  Folate 6.3 (> 5.9).    She has lupus (diagnosed 1982). She is on prednisone 5 mg a day and Plaquenil.  She has chronic renal insufficiency (Cr 1.51; CrCl 23.6 ml/min).  Symptomatically, she is feeling better.  Exam is unremarkable.  Hemoglobin is 11.4.  Ferritin is 57.  Plan: 1.  Discuss anemia associated with rectal bleeding.  Ferritin initially slightly low, but with repeat improved.  Hemoglobin remains low.  Discuss plan to reassess iron stores and investigate  other causes of anemia.  B12 borderline low.  Folate was just in the normal range.  She likely has some component of anemia of chronic renal insufficiency. 2.  Labs today:  CBC with diff, CMP, retic, [place], folate, ferritin, iron studies, sed rate, CRP, SPEP, FLCA, TSH. 3.  Preauth Venofer 4.  Begin oral B12 1000 mcg po q day. 5.  RTC in 1 month for MD assessment, labs (CBC with diff, ferritin, B12), review of workup, and +/- Venofer.   Lequita Asal, MD  03/07/2018, 4:35 PM

## 2018-03-08 LAB — PROTEIN ELECTROPHORESIS, SERUM
A/G Ratio: 1.3 (ref 0.7–1.7)
ALBUMIN ELP: 3.5 g/dL (ref 2.9–4.4)
ALPHA-1-GLOBULIN: 0.3 g/dL (ref 0.0–0.4)
ALPHA-2-GLOBULIN: 0.9 g/dL (ref 0.4–1.0)
BETA GLOBULIN: 0.9 g/dL (ref 0.7–1.3)
GAMMA GLOBULIN: 0.6 g/dL (ref 0.4–1.8)
Globulin, Total: 2.8 g/dL (ref 2.2–3.9)
TOTAL PROTEIN ELP: 6.3 g/dL (ref 6.0–8.5)

## 2018-03-08 LAB — T4, FREE: FREE T4: 0.85 ng/dL (ref 0.82–1.77)

## 2018-03-08 LAB — KAPPA/LAMBDA LIGHT CHAINS
KAPPA FREE LGHT CHN: 29.6 mg/L — AB (ref 3.3–19.4)
Kappa, lambda light chain ratio: 1.09 (ref 0.26–1.65)
Lambda free light chains: 27.2 mg/L — ABNORMAL HIGH (ref 5.7–26.3)

## 2018-03-15 ENCOUNTER — Encounter
Admission: RE | Disposition: A | Discharge status: Home / Self Care | Payer: Self-pay | Source: Ambulatory Visit | Attending: Gastroenterology

## 2018-03-15 ENCOUNTER — Ambulatory Visit
Admission: RE | Admit: 2018-03-15 | Discharge: 2018-03-15 | Disposition: A | Discharge status: Home / Self Care | Payer: Medicare Other | Source: Ambulatory Visit | Attending: Gastroenterology | Admitting: Gastroenterology

## 2018-03-15 ENCOUNTER — Ambulatory Visit: Payer: Medicare Other | Admitting: Anesthesiology

## 2018-03-15 ENCOUNTER — Other Ambulatory Visit: Payer: Self-pay

## 2018-03-15 DIAGNOSIS — Z79899 Other long term (current) drug therapy: Secondary | ICD-10-CM | POA: Diagnosis not present

## 2018-03-15 DIAGNOSIS — K2950 Unspecified chronic gastritis without bleeding: Secondary | ICD-10-CM | POA: Diagnosis not present

## 2018-03-15 DIAGNOSIS — I10 Essential (primary) hypertension: Secondary | ICD-10-CM | POA: Diagnosis not present

## 2018-03-15 DIAGNOSIS — Z882 Allergy status to sulfonamides status: Secondary | ICD-10-CM | POA: Diagnosis not present

## 2018-03-15 DIAGNOSIS — K2289 Other specified disease of esophagus: Secondary | ICD-10-CM

## 2018-03-15 DIAGNOSIS — K3189 Other diseases of stomach and duodenum: Secondary | ICD-10-CM

## 2018-03-15 DIAGNOSIS — K228 Other specified diseases of esophagus: Secondary | ICD-10-CM | POA: Diagnosis not present

## 2018-03-15 DIAGNOSIS — K921 Melena: Secondary | ICD-10-CM | POA: Diagnosis not present

## 2018-03-15 DIAGNOSIS — K219 Gastro-esophageal reflux disease without esophagitis: Secondary | ICD-10-CM | POA: Insufficient documentation

## 2018-03-15 DIAGNOSIS — C8339 Diffuse large B-cell lymphoma, extranodal and solid organ sites: Secondary | ICD-10-CM | POA: Insufficient documentation

## 2018-03-15 DIAGNOSIS — M329 Systemic lupus erythematosus, unspecified: Secondary | ICD-10-CM | POA: Insufficient documentation

## 2018-03-15 DIAGNOSIS — Q2733 Arteriovenous malformation of digestive system vessel: Secondary | ICD-10-CM | POA: Diagnosis not present

## 2018-03-15 DIAGNOSIS — Z951 Presence of aortocoronary bypass graft: Secondary | ICD-10-CM | POA: Insufficient documentation

## 2018-03-15 DIAGNOSIS — Z87891 Personal history of nicotine dependence: Secondary | ICD-10-CM | POA: Insufficient documentation

## 2018-03-15 DIAGNOSIS — Z880 Allergy status to penicillin: Secondary | ICD-10-CM | POA: Insufficient documentation

## 2018-03-15 DIAGNOSIS — Z888 Allergy status to other drugs, medicaments and biological substances status: Secondary | ICD-10-CM | POA: Diagnosis not present

## 2018-03-15 DIAGNOSIS — K269 Duodenal ulcer, unspecified as acute or chronic, without hemorrhage or perforation: Secondary | ICD-10-CM

## 2018-03-15 DIAGNOSIS — K31819 Angiodysplasia of stomach and duodenum without bleeding: Secondary | ICD-10-CM | POA: Diagnosis present

## 2018-03-15 HISTORY — PX: ESOPHAGOGASTRODUODENOSCOPY (EGD) WITH PROPOFOL: SHX5813

## 2018-03-15 SURGERY — ESOPHAGOGASTRODUODENOSCOPY (EGD) WITH PROPOFOL
Anesthesia: General

## 2018-03-15 MED ORDER — EPHEDRINE SULFATE 50 MG/ML IJ SOLN
INTRAMUSCULAR | Status: DC | PRN
  Administered 2018-03-15: 10 mg via INTRAVENOUS

## 2018-03-15 MED ORDER — PROPOFOL 500 MG/50ML IV EMUL
INTRAVENOUS | Status: DC | PRN
  Administered 2018-03-15: 180 ug/kg/min via INTRAVENOUS

## 2018-03-15 MED ORDER — PROPOFOL 10 MG/ML IV BOLUS
INTRAVENOUS | Status: DC | PRN
  Administered 2018-03-15: 10 mg via INTRAVENOUS
  Administered 2018-03-15: 40 mg via INTRAVENOUS

## 2018-03-15 MED ORDER — ONDANSETRON HCL 4 MG/2ML IJ SOLN: 4.0000 mg | Freq: Once | INTRAMUSCULAR | Status: DC | PRN

## 2018-03-15 MED ORDER — EPHEDRINE SULFATE 50 MG/ML IJ SOLN
INTRAMUSCULAR | Status: AC
  Filled 2018-03-15: qty 1

## 2018-03-15 MED ORDER — PANTOPRAZOLE SODIUM 40 MG PO TBEC: 40.0000 mg | DELAYED_RELEASE_TABLET | Freq: Two times a day (BID) | ORAL | 1 refills | Status: DC

## 2018-03-15 MED ORDER — FENTANYL CITRATE (PF) 100 MCG/2ML IJ SOLN: 25.0000 ug | INTRAMUSCULAR | Status: DC | PRN

## 2018-03-15 MED ORDER — SODIUM CHLORIDE 0.9 % IV SOLN
INTRAVENOUS | Status: DC
  Administered 2018-03-15 (×2): via INTRAVENOUS

## 2018-03-15 MED ORDER — PROPOFOL 500 MG/50ML IV EMUL
INTRAVENOUS | Status: AC
  Filled 2018-03-15: qty 50

## 2018-03-15 NOTE — Transfer of Care (Addendum)
Immediate Anesthesia Transfer of Care Note  Patient: Judy Torres  Procedure(s) Performed: ESOPHAGOGASTRODUODENOSCOPY (EGD) WITH PROPOFOL (N/A )  Patient Location: PACU  Anesthesia Type:General  Level of Consciousness: awake and oriented  Airway & Oxygen Therapy: Patient Spontanous Breathing and Patient connected to nasal cannula oxygen  Post-op Assessment: Report given to RN and Post -op Vital signs reviewed and stable  Post vital signs: Reviewed and stable 106/62  72  28  97  100 /Last Vitals:  Vitals Value Taken Time  BP  /  Temp    Pulse    Resp    SpO2      Last Pain:  Vitals:   03/15/18 0913  TempSrc: Tympanic  PainSc: 0-No pain         Complications: No apparent anesthesia complications

## 2018-03-15 NOTE — OR Nursing (Signed)
Dr. Ola Spurr into see pt and review EKG monitor compared to EKG 12 lead done on 01/2018.  No significant change ok to dc pt home.

## 2018-03-15 NOTE — Anesthesia Preprocedure Evaluation (Signed)
Anesthesia Evaluation  Patient identified by MRN, date of birth, ID band Patient awake    Reviewed: Allergy & Precautions, H&P , NPO status , Patient's Chart, lab work & pertinent test results, reviewed documented beta blocker date and time   Airway Mallampati: II   Neck ROM: full    Dental  (+) Poor Dentition, Upper Dentures, Lower Dentures   Pulmonary neg pulmonary ROS, former smoker,    Pulmonary exam normal        Cardiovascular Exercise Tolerance: Good hypertension, On Medications negative cardio ROS Normal cardiovascular exam Rhythm:regular Rate:Normal     Neuro/Psych negative neurological ROS  negative psych ROS   GI/Hepatic negative GI ROS, Neg liver ROS, GERD  Medicated,  Endo/Other  negative endocrine ROS  Renal/GU negative Renal ROS  negative genitourinary   Musculoskeletal   Abdominal   Peds  Hematology negative hematology ROS (+) anemia ,   Anesthesia Other Findings Past Medical History: No date: GERD (gastroesophageal reflux disease) No date: HTN (hypertension) No date: Lupus ([place]) Past Surgical History: No date: CARDIAC SURGERY No date: CESAREAN SECTION 12/30/2017: COLONOSCOPY; N/A     Comment:  Procedure: COLONOSCOPY;  Surgeon: Lin Landsman,               MD;  Location: [facility] ENDOSCOPY;  Service:               Gastroenterology;  Laterality: N/A; 2003: CORONARY ARTERY BYPASS GRAFT 12/30/2017: ESOPHAGOGASTRODUODENOSCOPY; N/A     Comment:  Procedure: ESOPHAGOGASTRODUODENOSCOPY (EGD);  Surgeon:               Lin Landsman, MD;  Location: [facility] ENDOSCOPY;                Service: Gastroenterology;  Laterality: N/A; 02/05/2018: ESOPHAGOGASTRODUODENOSCOPY; Left     Comment:  Procedure: ESOPHAGOGASTRODUODENOSCOPY (EGD);  Surgeon:               Virgel Manifold, MD;  Location: [facility] ENDOSCOPY;                Service: Endoscopy;  Laterality: Left; 12/31/2017: FLEXIBLE SIGMOIDOSCOPY; N/A   Comment:  Procedure: FLEXIBLE SIGMOIDOSCOPY;  Surgeon: Jonathon Bellows, MD;  Location: [facility] ENDOSCOPY;  Service:               Gastroenterology;  Laterality: N/A; BMI    Body Mass Index:  20.20 kg/m     Reproductive/Obstetrics negative OB ROS                             Anesthesia Physical Anesthesia Plan  ASA: III  Anesthesia Plan: General   Post-op Pain Management:    Induction:   PONV Risk Score and Plan:   Airway Management Planned:   Additional Equipment:   Intra-op Plan:   Post-operative Plan:   Informed Consent: I have reviewed the patients History and Physical, chart, labs and discussed the procedure including the risks, benefits and alternatives for the proposed anesthesia with the patient or authorized representative who has indicated his/her understanding and acceptance.   Dental Advisory Given  Plan Discussed with: CRNA  Anesthesia Plan Comments:         Anesthesia Quick Evaluation

## 2018-03-15 NOTE — Anesthesia Post-op Follow-up Note (Signed)
Anesthesia QCDR form completed.        

## 2018-03-15 NOTE — Op Note (Signed)
[facility] Gastroenterology Patient Name: [name] Procedure Date: 03/15/2018 9:18 AM MRN: [id number] Account #: [id number] Date of Birth: Mar 09, 1946 Admit Type: Outpatient Age: 72 Room: [facility] ENDO ROOM 4 Gender: Female Note Status: Finalized Procedure:            Upper GI endoscopy Indications:          Arteriovenous malformation in the small intestine,                        Hematochezia Providers:            Adela Esteban B. Bonna Gains MD, MD Referring MD:         [name], MD (Referring MD) Medicines:            Monitored Anesthesia Care Complications:        No immediate complications. Procedure:            Pre-Anesthesia Assessment:                       - The risks and benefits of the procedure and the                        sedation options and risks were discussed with the                        patient. All questions were answered and informed                        consent was obtained.                       - Patient identification and proposed procedure were                        verified prior to the procedure.                       - ASA Grade Assessment: II - A patient with mild                        systemic disease.                       After obtaining informed consent, the endoscope was                        passed under direct vision. Throughout the procedure,                        the patient's blood pressure, pulse, and oxygen                        saturations were monitored continuously. The Endoscope                        was introduced through the mouth, and advanced to the                        second part of duodenum. The upper GI endoscopy was  accomplished with ease. The patient tolerated the                        procedure well. Findings:      There were esophageal mucosal changes suspicious for short-segment       Barrett's esophagus present in the distal esophagus. The maximum   longitudinal extent of these mucosal changes was 0.5 cm in length.       Mucosa was biopsied with a cold forceps for histology in 4 quadrants.      Localized mild mucosal changes characterized by scarring were found in       the gastric fundus. Biopsies were taken with a cold forceps for       histology. Biopsies were obtained in the gastric body, at the incisura       and in the gastric antrum with cold forceps for Helicobacter pylori       testing.      The exam of the stomach was otherwise normal.      One non-bleeding cratered duodenal ulcer with a clean ulcer base       (Forrest Class III) was found in the second portion of the duodenum. The       lesion was 8 mm in largest dimension. Biopsies were taken with a cold       forceps for histology. This was specifically locared in the duodenal       sweep and had heaped edges. Two areas of bleeding were treated with APC       on last EGD a month ago. It is possible this ulcer was the proximal of       the two lesions treated. However, the large size of the ulcer is not       consistent with a typical post APC scar or ulcer. In addition, the       second site of treatment more distally does not have an ulcer at the       site.      Localized mild mucosal changes characterized by scalloping and white       specks were found in the second portion of the duodenum. Biopsies were       taken with a cold forceps for histology. One of these two lesions were       bleeding on last EGD and was treated with APC. The one more distal to       this was not bleeding on last EGD and did not need cautery. See images. Impression:           - Esophageal mucosal changes suspicious for                        short-segment Barrett's esophagus. Biopsied.                       - Scarred mucosa in the gastric fundus. Biopsied.                       - One non-bleeding duodenal ulcer with a clean ulcer                        base (Forrest Class III). Biopsied.                        - Mucosal changes  in the duodenum. Biopsied.                       - Biopsies were obtained in the gastric body, at the                        incisura and in the gastric antrum. Recommendation:       - Await pathology results.                       - Take prescribed proton pump inhibitor or H2 blocker                        (antacid) medications 30 - 60 minutes before meals.                       - Avoid NSAIDs except Aspirin if medically indicated                       - Return to my office as previously scheduled.                       - Return to primary care physician in 4 weeks.                       - Continue present medications.                       - The findings and recommendations were discussed with                        the patient.                       - The findings and recommendations were discussed with                        the patient's family. Procedure Code(s):    --- Professional ---                       [phone], Esophagogastroduodenoscopy, flexible, transoral;                        with biopsy, single or multiple Diagnosis Code(s):    --- Professional ---                       K22.8, Other specified diseases of esophagus                       K31.89, Other diseases of stomach and duodenum                       K26.9, Duodenal ulcer, unspecified as acute or chronic,                        without hemorrhage or perforation                       Q27.33, Arteriovenous malformation of digestive system  vessel                       K92.1, Melena (includes Hematochezia) CPT copyright 2017 American Medical Association. All rights reserved. The codes documented in this report are preliminary and upon coder review may  be revised to meet current compliance requirements.  [name], MD Margretta Sidle B. Bonna Gains MD, MD 03/15/2018 10:31:10 AM This report has been signed electronically. Number of Addenda: 0 Note Initiated On:  03/15/2018 9:18 AM Estimated Blood Loss: Estimated blood loss: none.      [facility]

## 2018-03-15 NOTE — Anesthesia Procedure Notes (Signed)
Date/Time: 03/15/2018 9:50 AM Performed by: Allean Found, CRNA Pre-anesthesia Checklist: Patient identified, Emergency Drugs available, Suction available, Patient being monitored and Timeout performed Patient Re-evaluated:Patient Re-evaluated prior to induction Oxygen Delivery Method: Nasal cannula Placement Confirmation: positive ETCO2

## 2018-03-15 NOTE — H&P (Signed)
[name], MD [address] [place], [place], [place], [address] [address], [place], [place], [place], [address] Phone: [phone]  Fax: [phone]  Primary Care Physician:  [name], MD   Pre-Procedure History & Physical: HPI:  Judy Torres is a 72 y.o. female is here for an EGD.   Past Medical History:  Diagnosis Date  . GERD (gastroesophageal reflux disease)   . HTN (hypertension)   . Lupus [facility])     Past Surgical History:  Procedure Laterality Date  . CARDIAC SURGERY    . CESAREAN SECTION    . COLONOSCOPY N/A 12/30/2017   Procedure: COLONOSCOPY;  Surgeon: Lin Landsman, MD;  Location: [facility] ENDOSCOPY;  Service: Gastroenterology;  Laterality: N/A;  . CORONARY ARTERY BYPASS GRAFT  2003  . ESOPHAGOGASTRODUODENOSCOPY N/A 12/30/2017   Procedure: ESOPHAGOGASTRODUODENOSCOPY (EGD);  Surgeon: Lin Landsman, MD;  Location[facility] ENDOSCOPY;  Service: Gastroenterology;  Laterality: N/A;  . ESOPHAGOGASTRODUODENOSCOPY Left 02/05/2018   Procedure: ESOPHAGOGASTRODUODENOSCOPY (EGD);  Surgeon: Virgel Manifold, MD;  Location: [facility] ENDOSCOPY;  Service: Endoscopy;  Laterality: Left;  . FLEXIBLE SIGMOIDOSCOPY N/A 12/31/2017   Procedure: FLEXIBLE SIGMOIDOSCOPY;  Surgeon: Jonathon Bellows, MD;  Location: [facility] ENDOSCOPY;  Service: Gastroenterology;  Laterality: N/A;    Prior to Admission medications   Medication Sig Start Date End Date Taking? Authorizing Provider  amLODipine (NORVASC) 5 MG tablet Take by mouth. 09/07/16  Yes [provider]  clotrimazole-betamethasone (LOTRISONE) cream APPLY TOPICALLY TWICE A DAY AS NEEDED 08/24/16  Yes [provider]  ezetimibe-simvastatin (VYTORIN) 10-40 MG per tablet Take 1 tablet by mouth daily.   Yes [provider]  feeding supplement, ENSURE ENLIVE, (ENSURE ENLIVE) LIQD Take 237 mLs by mouth 2 (two) times daily between meals. 01/02/18  Yes Salary, Avel Peace, MD  hydrochlorothiazide (HYDRODIURIL)  25 MG tablet Take by mouth. 08/15/14  Yes [provider]  hydroxychloroquine (PLAQUENIL) 200 MG tablet TAKE 1 TABLET BY MOUTH TWICE A DAY 01/22/14  Yes [provider]  oxyCODONE-acetaminophen (PERCOCET/ROXICET) 5-325 MG tablet TAKE 1 TABLET BY MOUTH 6 HOURS AS NEEDED FOR PAIN 12/26/17  Yes [provider]  pantoprazole (PROTONIX) 40 MG tablet Take 1 tablet (40 mg total) by mouth 2 (two) times daily before a meal. 02/06/18  Yes Vaughan Basta, MD  predniSONE (DELTASONE) 5 MG tablet Take 5 mg by mouth. 12/12/13  Yes [provider]  ranitidine (ZANTAC) 150 MG tablet Take by mouth.   Yes [provider]  simvastatin (ZOCOR) 40 MG tablet Take by mouth. 08/15/14  Yes [provider]  traMADol (ULTRAM) 50 MG tablet Take by mouth. 09/05/17  Yes [provider]  alendronate (FOSAMAX) 70 MG tablet Take 70 mg by mouth once a week. Saturdays    [provider]  ferrous sulfate 325 (65 FE) MG tablet Take 1 tablet (325 mg total) by mouth 2 (two) times daily with a meal. 02/06/18   Vaughan Basta, MD  Multiple Vitamin (MULTIVITAMIN WITH MINERALS) TABS tablet Take 1 tablet by mouth daily. 01/03/18   Salary, Avel Peace, MD  polyethylene glycol (MIRALAX / GLYCOLAX) packet Take 17 g by mouth daily.    [provider]  Pseudoephedrine-APAP 30-325 MG TABS Take by mouth.    [provider]    Allergies as of 02/16/2018 - Review Complete 02/13/2018  Allergen Reaction Noted  . Penicillins Other (See Comments) 02/28/2014  . Sulfa antibiotics Nausea Only 02/28/2014    Family History  Problem Relation Age of Onset  .  Hypertension Mother   . Stroke Mother   . Vascular Disease Mother   . Hypertension Sister     Social History   Socioeconomic History  . Marital status: Married    Spouse name: Not on file  . Number of children: Not on file  . Years of education: Not on file  . Highest education level: Not on file    Occupational History  . Not on file  Social Needs  . Financial resource strain: Not on file  . Food insecurity:    Worry: Not on file    Inability: Not on file  . Transportation needs:    Medical: Not on file    Non-medical: Not on file  Tobacco Use  . Smoking status: Former Smoker    Packs/day: 0.50    Years: 20.00    Pack years: 10.00    Types: Cigarettes    Last attempt to quit: 03/07/2002    Years since quitting: 16.0  . Smokeless tobacco: Never Used  Substance and Sexual Activity  . Alcohol use: Not Currently  . Drug use: Not Currently  . Sexual activity: Not on file  Lifestyle  . Physical activity:    Days per week: Not on file    Minutes per session: Not on file  . Stress: Not on file  Relationships  . Social connections:    Talks on phone: Not on file    Gets together: Not on file    Attends religious service: Not on file    Active member of club or organization: Not on file    Attends meetings of clubs or organizations: Not on file    Relationship status: Not on file  . Intimate partner violence:    Fear of current or ex partner: Not on file    Emotionally abused: Not on file    Physically abused: Not on file    Forced sexual activity: Not on file  Other Topics Concern  . Not on file  Social History Narrative  . Not on file    Review of Systems: See HPI, otherwise negative ROS  Physical Exam: There were no vitals taken for this visit. General:   Alert,  pleasant and cooperative Judy NAD Head:  Normocephalic and atraumatic. Neck:  Supple; no masses or thyromegaly. Lungs:  Clear throughout to auscultation, normal respiratory effort.    Heart:  +S1, +S2, Regular rate and rhythm, No edema. Abdomen:  Soft, nontender and nondistended. Normal bowel sounds, without guarding, and without rebound.   Neurologic:  Alert and  oriented x4;  grossly normal neurologically.  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 These areas appeared to be scalloped areas of villous atrophy. The EGD is to re-evaluate and biopsy these areas.   Risks, benefits, limitations, and alternatives regarding the procedure have been reviewed with the patient.  Questions have been answered.  All parties agreeable.   Virgel Manifold, MD  03/15/2018, 9:18 AM

## 2018-03-16 ENCOUNTER — Other Ambulatory Visit: Payer: Self-pay

## 2018-03-16 ENCOUNTER — Encounter: Payer: Self-pay | Admitting: Gastroenterology

## 2018-03-16 ENCOUNTER — Ambulatory Visit: Payer: Medicare Other | Admitting: Gastroenterology

## 2018-03-16 VITALS — BP 129/77 | HR 81 | Ht 59.0 in | Wt 103.8 lb

## 2018-03-16 DIAGNOSIS — K319 Disease of stomach and duodenum, unspecified: Secondary | ICD-10-CM | POA: Diagnosis not present

## 2018-03-16 NOTE — Progress Notes (Signed)
Judy Antigua, MD [address]  [place]  [place], [place] [address]  Main: [phone]  Fax: [phone]   Primary Care Physician: [name], MD  Primary Gastroenterologist:  Dr. Vonda Torres  Chief Complaint  Patient presents with  . Follow-up    results    HPI: [name] is a 72 y.o. female with recent history of hematochezia in July 2019 here for follow-up.  Underwent EGD yesterday, denies any melena, abdominal pain, hematochezia, loss of appetite since the procedure.  Initial EGD in July 2019 showed 2 scalloped/denuded mucosa areas in the second portion of the duodenum, with bleeding seen at those areas and treated with APC.  These areas could not be biopsied at the time due to the bleeding, and a repeat EGD was done yesterday for the purposes of biopsies.  EGD yesterday showed short segment Barrett's and was biopsied.  Localized mucosal changes in the gastric fundus was seen and was biopsied.  One nonbleeding duodenal ulcer with clean ulcer base was seen in the second portion of the duodenum.  This was located in the duodenal sweep.  It was noted to have heaped edges.  It was thought that this may be 1 of the previously treated APC areas on last EGD, however the large size of the ulcer was not consistent with typical post APC scar ulcer.  In addition, the second area treated with APC on previous EGD did not have an ulcer at that site.  The duodenal ulcer was biopsied.  Localized mild mucosal changes characterized by scalloping and white specks seen in the second portion were biopsied.  The pathology report is pending, but a verbal report given to me by Dr. Bryan Lemma.  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  She states they are sending the pathology slides to [facility] for further staining and diagnosis.  I have informed  Dr. Mike Gip through epic messaging about this, and she has acknowledged the message.  No current facility-administered medications for this visit.    Current Outpatient Medications  Medication Sig Dispense Refill  . pantoprazole (PROTONIX) 40 MG tablet Take 1 tablet (40 mg total) by mouth 2 (two) times daily before a meal. 60 tablet 1   Facility-Administered Medications Ordered in Other Visits  Medication Dose Route Frequency Provider Last Rate Last Dose  . 0.9 %  sodium chloride infusion   Intravenous Continuous Judy Torres B, MD 20 mL/hr at 03/15/18 0920    . fentaNYL (SUBLIMAZE) injection 25 mcg  25 mcg Intravenous Q5 min PRN Molli Barrows, MD      . ondansetron [facility]) injection 4 mg  4 mg Intravenous Once PRN Molli Barrows, MD        Allergies as of 03/16/2018 - Review Complete 03/16/2018  Allergen Reaction Noted  . Penicillins Other (See Comments) 02/28/2014  . Sulfa antibiotics Nausea Only 02/28/2014  . Sulfasalazine Other (See Comments) 03/07/2013  . Ace inhibitors Nausea Only 03/07/2013  . Losartan Anxiety 03/07/2013  . Spironolactone Other (See Comments) 04/25/2013    ROS:  General: Negative for anorexia, weight loss, fever, chills, fatigue, weakness. ENT: Negative for hoarseness, difficulty swallowing , nasal congestion. CV: Negative for chest pain, angina, palpitations, dyspnea on exertion, peripheral edema.  Respiratory: Negative for dyspnea at rest, dyspnea on exertion, cough, sputum, wheezing.  GI: See history of present illness. GU:  Negative  for dysuria, hematuria, urinary incontinence, urinary frequency, nocturnal urination.  Endo: Negative for unusual weight change.    Physical Examination:   BP 129/77   Pulse 81   Ht 4\' 11"  (1.499 m)   Wt 103 lb 12.8 oz (47.1 kg)   BMI 20.97 kg/m   General: Well-nourished, well-developed in no acute distress.  Eyes: No icterus. Conjunctivae pink. Mouth: Oropharyngeal mucosa moist and pink , no lesions  erythema or exudate. Neck: Supple, Trachea midline Abdomen: Bowel sounds are normal, nontender, nondistended, no hepatosplenomegaly or masses, no abdominal bruits or hernia , no rebound or guarding.   Extremities: No lower extremity edema. No clubbing or deformities. Neuro: Alert and oriented x 3.  Grossly intact. Skin: Warm and dry, no jaundice.   Psych: Alert and cooperative, normal mood and affect.   Labs: CMP     Component Value Date/Time   NA 138 03/07/2018 1301   K 3.4 (L) 03/07/2018 1301   CL 100 03/07/2018 1301   CO2 26 03/07/2018 1301   GLUCOSE 107 (H) 03/07/2018 1301   BUN 32 (H) 03/07/2018 1301   CREATININE 1.51 (H) 03/07/2018 1301   CREATININE 1.05 12/15/2011 1221   CALCIUM 9.7 03/07/2018 1301   PROT 6.9 03/07/2018 1301   ALBUMIN 4.2 03/07/2018 1301   AST 30 03/07/2018 1301   ALT 16 03/07/2018 1301   ALKPHOS 67 03/07/2018 1301   BILITOT 0.5 03/07/2018 1301   GFRNONAA 34 (L) 03/07/2018 1301   GFRNONAA 56 (L) 12/15/2011 1221   GFRAA 39 (L) 03/07/2018 1301   GFRAA >60 12/15/2011 1221   Lab Results  Component Value Date   WBC 9.5 03/07/2018   HGB 11.4 (L) 03/07/2018   HCT 34.3 (L) 03/07/2018   MCV 95.5 03/07/2018   PLT 304 03/07/2018    Imaging Studies: No results found.  Assessment and Plan:   [name] is a 72 y.o. y/o female here for follow-up of hematochezia, and abnormal mucosa seen in the duodenum and gastric fundus on EGD yesterday  Official biopsy report pending The pathology report is pending, but a verbal report given to me by Dr. Bryan Lemma.  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  She states they are sending the pathology slides to [facility] for further staining and diagnosis.  I have informed Dr. Mike Gip through epic messaging about this, and she has acknowledged the message.  Patient and husband informed of  results in detail and they verbalized understanding All questions answered to their satisfaction Patient has follow-up with Dr. Mike Gip scheduled I have asked him to call their office to see if an earlier appointment is available  Once I receive the official report, I will forward it Dr. Mike Gip as well  Further plan of care dependent on discussions with oncology.  Patient to follow-up with oncology closely.  Dr Judy Torres

## 2018-03-16 NOTE — Anesthesia Postprocedure Evaluation (Signed)
Anesthesia Post Note  Patient: Judy Torres  Procedure(s) Performed: ESOPHAGOGASTRODUODENOSCOPY (EGD) WITH PROPOFOL (N/A )  Patient location during evaluation: PACU Anesthesia Type: General Level of consciousness: awake and alert Pain management: pain level controlled Vital Signs Assessment: post-procedure vital signs reviewed and stable Respiratory status: spontaneous breathing, nonlabored ventilation, respiratory function stable and patient connected to nasal cannula oxygen Cardiovascular status: blood pressure returned to baseline and stable Postop Assessment: no apparent nausea or vomiting Anesthetic complications: no     Last Vitals:  Vitals:   03/15/18 1032 03/15/18 1042  BP: 107/63 106/63  Pulse: 74 71  Resp: 20 (!) 22  Temp:    SpO2: 99% 97%    Last Pain:  Vitals:   03/15/18 1042  TempSrc:   PainSc: 0-No pain                 Molli Barrows

## 2018-03-23 ENCOUNTER — Telehealth: Payer: Self-pay | Admitting: Gastroenterology

## 2018-03-23 NOTE — Telephone Encounter (Signed)
I called the [place] and left message to contact our office regarding referral. I called Marcello Moores, pt's husband and made him aware that I will contact him as soon as I know something.

## 2018-03-23 NOTE — Telephone Encounter (Signed)
Pt husband is calling to check if we have heard anything from the cancer center pt has not heard anything please call pt husband cell number back

## 2018-03-24 NOTE — Telephone Encounter (Signed)
Patients husband has been informed that his wife was seen with Dr. Mike Gip on 03/07/18. She is an established patient with them.  Her next office visit is 09/23 for labs and office follow up.  He has also been informed of her follow up appt with [place] for 09/12.    Thanks [employer]

## 2018-03-30 ENCOUNTER — Encounter: Payer: Self-pay | Admitting: Gastroenterology

## 2018-03-30 ENCOUNTER — Ambulatory Visit: Payer: Medicare Other | Admitting: Gastroenterology

## 2018-03-30 VITALS — BP 119/72 | HR 92 | Ht 59.0 in | Wt 101.8 lb

## 2018-03-30 DIAGNOSIS — C8510 Unspecified B-cell lymphoma, unspecified site: Secondary | ICD-10-CM

## 2018-03-30 NOTE — Progress Notes (Signed)
Judy Antigua, MD [address]  [place], [place] [address]  Main: [phone]  Fax: [phone]   Primary Care Physician: [name], MD  Primary Gastroenterologist:  Dr. Vonda Torres  Chief Complaint  Patient presents with  . Follow-up    HPI: [name] is a 72 y.o. 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  Patient denies any abdominal pain.  Reports decreased appetite.  No nausea or vomiting.  No altered bowel habits, blood in stool, dysphagia, heartburn.  Has an appointment with Dr. Mike Gip in 4 days to discuss treatment options.  EGD August 2019 showed esophageal mucosal changes suspicious for Barrett's esophagus.  Localized mild mucosal changes in the gastric fundus that appeared to be an area of scarring was noted and biopsies were done.  Nonbleeding cratered duodenal ulcer with clean base was seen in the second portion of the duodenum and was biopsied.  Localized mild mucosal changes characterized by scalloping and white specks were found in the second portion of the duodenum and were biopsied.  Biopsy results showed large B-cell lymphoma in the duodenal and stomach biopsies.    Current Outpatient Medications  Medication Sig Dispense Refill  . alendronate (FOSAMAX) 70 MG tablet Take 70 mg by mouth once a week. Saturdays    . amLODipine (NORVASC) 5 MG tablet Take by mouth.    . clotrimazole-betamethasone (LOTRISONE) cream APPLY TOPICALLY TWICE A DAY AS NEEDED    . ezetimibe-simvastatin (VYTORIN) 10-40 MG per tablet Take 1 tablet by mouth daily.    . feeding supplement, ENSURE ENLIVE, (ENSURE ENLIVE) LIQD Take 237 mLs by mouth 2 (two) times daily between meals. 90 Bottle 0  . ferrous sulfate 325 (65 FE) MG tablet Take 1 tablet (325 mg total) by mouth 2 (two) times daily with a meal. 60 tablet 3  . hydrochlorothiazide (HYDRODIURIL) 25 MG tablet Take by mouth.    .  hydroxychloroquine (PLAQUENIL) 200 MG tablet TAKE 1 TABLET BY MOUTH TWICE A DAY    . Multiple Vitamin (MULTIVITAMIN WITH MINERALS) TABS tablet Take 1 tablet by mouth daily. 180 tablet 0  . oxyCODONE-acetaminophen (PERCOCET/ROXICET) 5-325 MG tablet TAKE 1 TABLET BY MOUTH 6 HOURS AS NEEDED FOR PAIN  0  . pantoprazole (PROTONIX) 40 MG tablet Take 1 tablet (40 mg total) by mouth 2 (two) times daily before a meal. 60 tablet 1  . polyethylene glycol (MIRALAX / GLYCOLAX) packet Take 17 g by mouth daily.    . predniSONE (DELTASONE) 5 MG tablet Take 5 mg by mouth.    . Pseudoephedrine-APAP 30-325 MG TABS Take by mouth.    . ranitidine (ZANTAC) 150 MG tablet Take by mouth.    . simvastatin (ZOCOR) 40 MG tablet Take by mouth.    . traMADol (ULTRAM) 50 MG tablet Take by mouth.     No current facility-administered medications for this visit.     Allergies as of 03/30/2018 - Review Complete 03/30/2018  Allergen Reaction Noted  . Penicillins Other (See Comments) 02/28/2014  . Sulfa antibiotics Nausea Only 02/28/2014  . Sulfasalazine Other (See Comments) 03/07/2013  . Ace inhibitors Nausea Only 03/07/2013  . Losartan Anxiety 03/07/2013  . Spironolactone Other (See Comments) 04/25/2013    ROS:  General: Negative for anorexia, weight loss, fever, chills, fatigue, weakness. ENT: Negative for hoarseness, difficulty swallowing , nasal congestion. CV: Negative for chest pain, angina, palpitations, dyspnea on exertion, peripheral edema.  Respiratory: Negative for  dyspnea at rest, dyspnea on exertion, cough, sputum, wheezing.  GI: See history of present illness. GU:  Negative for dysuria, hematuria, urinary incontinence, urinary frequency, nocturnal urination.  Endo: Negative for unusual weight change.    Physical Examination:   BP 119/72   Pulse 92   Ht 4\' 11"  (1.499 m)   Wt 101 lb 12.8 oz (46.2 kg)   BMI 20.56 kg/m   General: Well-nourished, well-developed in no acute distress.  Eyes: No  icterus. Conjunctivae pink. Mouth: Oropharyngeal mucosa moist and pink , no lesions erythema or exudate. Neck: Supple, Trachea midline Abdomen: Bowel sounds are normal, nontender, nondistended, no hepatosplenomegaly or masses, no abdominal bruits or hernia , no rebound or guarding.   Extremities: No lower extremity edema. No clubbing or deformities. Neuro: Alert and oriented x 3.  Grossly intact. Skin: Warm and dry, no jaundice.   Psych: Alert and cooperative, normal mood and affect.   Labs: CMP     Component Value Date/Time   NA 138 03/07/2018 1301   K 3.4 (L) 03/07/2018 1301   CL 100 03/07/2018 1301   CO2 26 03/07/2018 1301   GLUCOSE 107 (H) 03/07/2018 1301   BUN 32 (H) 03/07/2018 1301   CREATININE 1.51 (H) 03/07/2018 1301   CREATININE 1.05 12/15/2011 1221   CALCIUM 9.7 03/07/2018 1301   PROT 6.9 03/07/2018 1301   ALBUMIN 4.2 03/07/2018 1301   AST 30 03/07/2018 1301   ALT 16 03/07/2018 1301   ALKPHOS 67 03/07/2018 1301   BILITOT 0.5 03/07/2018 1301   GFRNONAA 34 (L) 03/07/2018 1301   GFRNONAA 56 (L) 12/15/2011 1221   GFRAA 39 (L) 03/07/2018 1301   GFRAA >60 12/15/2011 1221   Lab Results  Component Value Date   WBC 9.5 03/07/2018   HGB 11.4 (L) 03/07/2018   HCT 34.3 (L) 03/07/2018   MCV 95.5 03/07/2018   PLT 304 03/07/2018    Imaging Studies: No results found.  Assessment and Plan:   [name] is a 72 y.o. y/o female recently diagnosed with large B-cell lymphoma on duodenal and stomach biopsies in August 2019  Patient denies any abdominal pain Reports decreased appetite, but is trying to use nutritional supplement such as Ensure or boost Follow-up with Dr. Mike Gip closely to discuss treatment options Pathology results and cancer diagnosis discussed in detail with patient family.  They verbalized the importance of close follow-up with Dr. Mike Gip.  I have encouraged patient family to call us with any questions or concerns No indication for repeat  endoscopy at this time  Follow-up with [place] as needed, and primary care provider Dr. Tomi Bamberger can refer back to [place] if future need for endoscopic visualization is needed.  Repeat recommended in 2 years from June 2019 for polyp surveillance, see colonoscopy report done by Dr. Marius Ditch.  Dr Judy Torres

## 2018-03-31 ENCOUNTER — Encounter: Payer: Self-pay | Admitting: Hematology and Oncology

## 2018-03-31 LAB — SURGICAL PATHOLOGY

## 2018-04-03 ENCOUNTER — Other Ambulatory Visit: Payer: Self-pay

## 2018-04-03 ENCOUNTER — Inpatient Hospital Stay (HOSPITAL_BASED_OUTPATIENT_CLINIC_OR_DEPARTMENT_OTHER): Payer: Medicare Other | Admitting: Hematology and Oncology

## 2018-04-03 ENCOUNTER — Inpatient Hospital Stay: Payer: Medicare Other | Attending: Hematology and Oncology

## 2018-04-03 ENCOUNTER — Inpatient Hospital Stay: Payer: Medicare Other

## 2018-04-03 VITALS — BP 152/81 | HR 96 | Temp 99.5°F | Resp 18 | Wt 102.1 lb

## 2018-04-03 DIAGNOSIS — C8339 Diffuse large B-cell lymphoma, extranodal and solid organ sites: Secondary | ICD-10-CM | POA: Diagnosis present

## 2018-04-03 DIAGNOSIS — I129 Hypertensive chronic kidney disease with stage 1 through stage 4 chronic kidney disease, or unspecified chronic kidney disease: Secondary | ICD-10-CM

## 2018-04-03 DIAGNOSIS — D6489 Other specified anemias: Secondary | ICD-10-CM | POA: Insufficient documentation

## 2018-04-03 DIAGNOSIS — D122 Benign neoplasm of ascending colon: Secondary | ICD-10-CM | POA: Insufficient documentation

## 2018-04-03 DIAGNOSIS — N189 Chronic kidney disease, unspecified: Secondary | ICD-10-CM | POA: Insufficient documentation

## 2018-04-03 DIAGNOSIS — C8330 Diffuse large B-cell lymphoma, unspecified site: Secondary | ICD-10-CM | POA: Insufficient documentation

## 2018-04-03 DIAGNOSIS — E876 Hypokalemia: Secondary | ICD-10-CM | POA: Diagnosis not present

## 2018-04-03 DIAGNOSIS — E538 Deficiency of other specified B group vitamins: Secondary | ICD-10-CM | POA: Diagnosis not present

## 2018-04-03 DIAGNOSIS — M329 Systemic lupus erythematosus, unspecified: Secondary | ICD-10-CM | POA: Diagnosis not present

## 2018-04-03 DIAGNOSIS — K621 Rectal polyp: Secondary | ICD-10-CM

## 2018-04-03 DIAGNOSIS — D125 Benign neoplasm of sigmoid colon: Secondary | ICD-10-CM | POA: Insufficient documentation

## 2018-04-03 DIAGNOSIS — E790 Hyperuricemia without signs of inflammatory arthritis and tophaceous disease: Secondary | ICD-10-CM | POA: Diagnosis not present

## 2018-04-03 DIAGNOSIS — D123 Benign neoplasm of transverse colon: Secondary | ICD-10-CM

## 2018-04-03 DIAGNOSIS — Z7189 Other specified counseling: Secondary | ICD-10-CM

## 2018-04-03 DIAGNOSIS — D500 Iron deficiency anemia secondary to blood loss (chronic): Secondary | ICD-10-CM

## 2018-04-03 DIAGNOSIS — E611 Iron deficiency: Secondary | ICD-10-CM | POA: Insufficient documentation

## 2018-04-03 DIAGNOSIS — D124 Benign neoplasm of descending colon: Secondary | ICD-10-CM | POA: Insufficient documentation

## 2018-04-03 LAB — CBC WITH DIFFERENTIAL/PLATELET
Basophils Absolute: 0 10*3/uL (ref 0–0.1)
Basophils Relative: 1 %
EOS PCT: 6 %
Eosinophils Absolute: 0.1 10*3/uL (ref 0–0.7)
HEMATOCRIT: 32.5 % — AB (ref 35.0–47.0)
HEMOGLOBIN: 10.7 g/dL — AB (ref 12.0–16.0)
LYMPHS PCT: 37 %
Lymphs Abs: 0.7 10*3/uL — ABNORMAL LOW (ref 1.0–3.6)
MCH: 30.3 pg (ref 26.0–34.0)
MCHC: 33 g/dL (ref 32.0–36.0)
MCV: 91.7 fL (ref 80.0–100.0)
Monocytes Absolute: 0.4 10*3/uL (ref 0.2–0.9)
Monocytes Relative: 20 %
NEUTROS PCT: 36 %
Neutro Abs: 0.6 10*3/uL — ABNORMAL LOW (ref 1.4–6.5)
Platelets: 207 10*3/uL (ref 150–440)
RBC: 3.54 MIL/uL — AB (ref 3.80–5.20)
RDW: 13.9 % (ref 11.5–14.5)
WBC: 1.8 10*3/uL — AB (ref 3.6–11.0)

## 2018-04-03 LAB — URIC ACID: Uric Acid, Serum: 8.8 mg/dL — ABNORMAL HIGH (ref 2.5–7.1)

## 2018-04-03 LAB — BASIC METABOLIC PANEL
ANION GAP: 12 (ref 5–15)
BUN: 26 mg/dL — AB (ref 8–23)
CHLORIDE: 99 mmol/L (ref 98–111)
CO2: 25 mmol/L (ref 22–32)
Calcium: 9.7 mg/dL (ref 8.9–10.3)
Creatinine, Ser: 1.43 mg/dL — ABNORMAL HIGH (ref 0.44–1.00)
GFR calc Af Amer: 42 mL/min — ABNORMAL LOW (ref >60)
GFR calc non Af Amer: 36 mL/min — ABNORMAL LOW (ref >60)
Glucose, Bld: 189 mg/dL — ABNORMAL HIGH (ref 70–99)
POTASSIUM: 3 mmol/L — AB (ref 3.5–5.1)
SODIUM: 136 mmol/L (ref 135–145)

## 2018-04-03 LAB — FERRITIN: Ferritin: 38 ng/mL (ref 11–307)

## 2018-04-03 LAB — VITAMIN B12: VITAMIN B 12: 1524 pg/mL — AB (ref 180–914)

## 2018-04-03 LAB — FOLATE: Folate: 55.8 ng/mL (ref >5.9)

## 2018-04-03 LAB — LACTATE DEHYDROGENASE: LDH: 241 U/L — ABNORMAL HIGH (ref 98–192)

## 2018-04-03 NOTE — Progress Notes (Signed)
[facility] day:  04/03/2018  Chief Complaint: In Aldona Bryner is a 72 y.o. female with iron deficiency anemia who is seen for review of interval EGD and discussion regarding direction of therapy.  HPI: The patient was last seen in the medical oncology clinic on 03/07/2018 for initial consultation.  She had a normocytic anemia and had recently been admitted with GI bleeding x 2.  Her ferritin had improved, but her anemia persisted.  An additional work-up was pursued.  Labs on 03/07/2018 revealed a hematocrit of 34.3, hemoglobin 11.4, MCV 95.5, platelets 304,000, WBC 9500 with an ANC of 6900.  Creatinine was 1.51.  Ferritin was 57 with an iron saturation of 9% (low) and a TIBC of 386.  Folate was 78.0.  ESR was 34.  CRP was 1.7 (< 1.0).  TSH was 8.529 (0.35 - 4.5) with a free T4 of 0.85 (0.82 - 1.77).  SPEP revealed no monoclonal protein.  Kappa free light chains were 29.6, lambda free light chains 27.2 with a ratio of 1.09 (normal).  Coombs was negative.  She underwent repeat EGD on 03/15/2018.  Pathology from the duodenal scalloped lesion and duodenal sweep ulcer revealed involvement with diffuse large B cell lymphoma.  Random gastric biopsy was negative for lymphoma.  There was mild chronic gastritis with reactive foveolar hyperplasia.  Gastric fundus scar revealed involvement with diffuse large B cell lymphoma.  GE junction biopsy revealed no lymphoma with squamocolumnar mucosa with mild chronic active inflammation.  Pathology was referred to [facility].  The lymphoma has a non-germinal center immunophenotype with a Ki-67 proliferation index of 60% and shows Bcl-2/CMYC co-expression by immunohistochemical analysis.   Summary of IHC results, neoplastic cells: Positive: PAX-5, CD20(variable), Bcl-2, MUM1, Bcl-6 (variable), CMYC.  Negative: CD10, CD5, CD30, CD138, Cyclin-D1, ALK-1, CD3.  EBV (ISH): Negative.  FISH for MYC gene rearrangement (8q24 gene region) was  negative.  Additional findings: 41% of the 100 nuclei scored show 3 to 4 intact MYC hybridization signals. Additionally, a Vysis MYC/IGH/CEP8(D8Z2) dual fusion FISH assay, performed to rule out rare MYC/IGH rearrangements that would not be detected by [facility] break apart FISH analysis, was also negative for rearrangement in 100 cells scored. Of note, additional copies of MYC and/or IGH were observed in at least 50% of cells scored.  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  The patient was seen by Dr. Bonna Gains on 03/16/2018 and 03/30/2018.  The patient noted decreased appetite, but no abdominal pain.  Results of her biopsy were discussed.  During the interim, patient has been feeling "ok". Energy level is good in the morning, however she tires as the day goes on. She denies nausea and vomiting. She is experiencing drenching sweats "some days". She denies fevers and changes to her bowel habits. Patient has sinus drainage and a chronic cough. She denies aquagenic pruritis.   Patient advises that she maintains an adequate appetite. She eats small amounts. Patient maintains a "nutritious diet" and husband advises that she "doesn't eat a lot of junk". Weight today is 102 lb 1.6 oz (46.3 kg), which compared to her last visit to the clinic, represents a 1 pound weight loss. Baseline weight is about 120 pounds.   Patient denies pain in the clinic today.   Past Medical History:  Diagnosis Date  . GERD (gastroesophageal reflux disease)   . HTN (hypertension)   . Lupus [facility])     Past Surgical History:  Procedure Laterality Date  . CARDIAC SURGERY    .  CESAREAN SECTION    . COLONOSCOPY N/A 12/30/2017   Procedure: COLONOSCOPY;  Surgeon: Vanga, Rohini Reddy, MD;  Location: [facility] ENDOSCOPY;  Service: Gastroenterology;  Laterality: N/A;  . CORONARY ARTERY BYPASS GRAFT  2003  . ESOPHAGOGASTRODUODENOSCOPY N/A 12/30/2017   Procedure: ESOPHAGOGASTRODUODENOSCOPY (EGD);  Surgeon: Vanga, Rohini Reddy, MD;   Location: [facility] ENDOSCOPY;  Service: Gastroenterology;  Laterality: N/A;  . ESOPHAGOGASTRODUODENOSCOPY Left 02/05/2018   Procedure: ESOPHAGOGASTRODUODENOSCOPY (EGD);  Surgeon: Tahiliani, Varnita B, MD;  Location: [facility] ENDOSCOPY;  Service: Endoscopy;  Laterality: Left;  . ESOPHAGOGASTRODUODENOSCOPY (EGD) WITH PROPOFOL N/A 03/15/2018   Procedure: ESOPHAGOGASTRODUODENOSCOPY (EGD) WITH PROPOFOL;  Surgeon: Tahiliani, Varnita B, MD;  Location: [facility] ENDOSCOPY;  Service: Endoscopy;  Laterality: N/A;  . FLEXIBLE SIGMOIDOSCOPY N/A 12/31/2017   Procedure: FLEXIBLE SIGMOIDOSCOPY;  Surgeon: Anna, Kiran, MD;  Location: [facility] ENDOSCOPY;  Service: Gastroenterology;  Laterality: N/A;    Family History  Problem Relation Age of Onset  . Hypertension Mother   . Stroke Mother   . Vascular Disease Mother   . Hypertension Sister     Social History:  reports that she quit smoking about 16 years ago. Her smoking use included cigarettes. She has a 10.00 pack-year smoking history. She has never used smokeless tobacco. She reports that she drank alcohol. She reports that she has current or past drug history.  She has smoked 1/2 pack/day x 20 years.  She stopped smoking in 2000.  She denies any exposure to radiation or toxins.  She is from [place].  She moved to the [place] years ago after marrying her husband.  She was in the army.  The patient is accompanied by her husband and daughter today.  Allergies:  Allergies  Allergen Reactions  . Penicillins Other (See Comments)    Pass out  . Sulfa Antibiotics Nausea Only  . Sulfasalazine Other (See Comments)    Other reaction(s): NOT KNOWN   . Ace Inhibitors Nausea Only  . Losartan Anxiety    Other reaction(s): OTHER Other reaction(s): OTHER   . Spironolactone Other (See Comments)    Felt poorly but couldn't clarify Felt poorly but couldn't clarify     Current Medications: Current Outpatient Medications  Medication Sig Dispense Refill  . alendronate  (FOSAMAX) 70 MG tablet Take 70 mg by mouth once a week. Saturdays    . amLODipine (NORVASC) 5 MG tablet Take by mouth.    . clotrimazole-betamethasone (LOTRISONE) cream APPLY TOPICALLY TWICE A DAY AS NEEDED    . ezetimibe-simvastatin (VYTORIN) 10-40 MG per tablet Take 1 tablet by mouth daily.    . feeding supplement, ENSURE ENLIVE, (ENSURE ENLIVE) LIQD Take 237 mLs by mouth 2 (two) times daily between meals. 90 Bottle 0  . ferrous sulfate 325 (65 FE) MG tablet Take 1 tablet (325 mg total) by mouth 2 (two) times daily with a meal. 60 tablet 3  . hydrochlorothiazide (HYDRODIURIL) 25 MG tablet Take by mouth.    . hydroxychloroquine (PLAQUENIL) 200 MG tablet TAKE 1 TABLET BY MOUTH TWICE A DAY    . Multiple Vitamin (MULTIVITAMIN WITH MINERALS) TABS tablet Take 1 tablet by mouth daily. 180 tablet 0  . pantoprazole (PROTONIX) 40 MG tablet Take 1 tablet (40 mg total) by mouth 2 (two) times daily before a meal. 60 tablet 1  . polyethylene glycol (MIRALAX / GLYCOLAX) packet Take 17 g by mouth daily.    . predniSONE (DELTASONE) 5 MG tablet Take 5 mg by mouth.    . Pseudoephedrine-APAP 30-325 MG TABS Take by   mouth.    . ranitidine (ZANTAC) 150 MG tablet Take by mouth.    . simvastatin (ZOCOR) 40 MG tablet Take by mouth.    . traMADol (ULTRAM) 50 MG tablet Take by mouth.    . oxyCODONE-acetaminophen (PERCOCET/ROXICET) 5-325 MG tablet TAKE 1 TABLET BY MOUTH 6 HOURS AS NEEDED FOR PAIN  0   No current facility-administered medications for this visit.     Review of Systems:  GENERAL:  Doing "well".  No fevers.  Drenching sweats.  Not gaining weight.  Baseline weight 120 pounds. PERFORMANCE STATUS (ECOG):  1 HEENT:  Sinus drainage.  No visual changes, runny nose, sore throat, mouth sores or tenderness. Lungs: No shortness of breath.  Cough secondary to drainage.  No hemoptysis. Cardiac:  No chest pain, palpitations, orthopnea, or PND. GI:  No nausea, vomiting, diarrhea, constipation, melena or  hematochezia. GU:  No urgency, frequency, dysuria, or hematuria. Musculoskeletal:  No back pain.  No joint pain.  No muscle tenderness. Extremities:  No pain or swelling. Skin:  No rashes or skin changes. Neuro:  No headache, numbness or weakness, balance or coordination issues. Endocrine:  No diabetes, thyroid issues, hot flashes or night sweats. Psych:  No mood changes, depression or anxiety. Pain:  No focal pain. Review of systems:  All other systems reviewed and found to be negative.   Physical Exam: Blood pressure (!) 152/81, pulse 96, temperature 99.5 F (37.5 C), temperature source Tympanic, resp. rate 18, weight 102 lb 1.6 oz (46.3 kg). GENERAL:  Thin woman sitting comfortably in the exam room in no acute distress. MENTAL STATUS:  Alert and oriented to person, place and time. HEAD:  Short dark curly hair.  Normocephalic, atraumatic, face symmetric, no Cushingoid features. EYES:  Glasses.  Brown eyes.  Pupils equal round and reactive to light and accomodation.  No conjunctivitis or scleral icterus. ENT:  Oropharynx clear without lesion.  Tongue normal.  Dentures.  Mucous membranes moist.  RESPIRATORY:  Clear to auscultation without rales, wheezes or rhonchi. CARDIOVASCULAR:  Regular rate and rhythm without murmur, rub or gallop. ABDOMEN:  Soft, non-tender, with active bowel sounds, and no hepatosplenomegaly.  No masses. SKIN:  No rashes, ulcers or lesions. EXTREMITIES: No edema, no skin discoloration or tenderness.  No palpable cords. LYMPH NODES: No palpable cervical, supraclavicular, axillary or inguinal adenopathy  NEUROLOGICAL: Unremarkable. PSYCH:  Appropriate.    Appointment on 04/03/2018  Component Date Value Ref Range Status  . Sodium 04/03/2018 136  135 - 145 mmol/L Final  . Potassium 04/03/2018 3.0* 3.5 - 5.1 mmol/L Final  . Chloride 04/03/2018 99  98 - 111 mmol/L Final  . CO2 04/03/2018 25  22 - 32 mmol/L Final  . Glucose, Bld 04/03/2018 189* 70 - 99 mg/dL Final   . BUN 04/03/2018 26* 8 - 23 mg/dL Final  . Creatinine, Ser 04/03/2018 1.43* 0.44 - 1.00 mg/dL Final  . Calcium 04/03/2018 9.7  8.9 - 10.3 mg/dL Final  . GFR calc non Af Amer 04/03/2018 36* >60 mL/min Final  . GFR calc Af Amer 04/03/2018 42* >60 mL/min Final   Comment: (NOTE) The eGFR has been calculated using the CKD EPI equation. This calculation has not been validated in all clinical situations. eGFR's persistently <60 mL/min signify possible Chronic Kidney Disease.   Georgiann Hahn gap 04/03/2018 12  5 - 15 Final   Performed at [facility], [address] [place], [place] [address]  . LDH 04/03/2018 241* 98 - 192 U/L Final  Performed at [facility], [address] [place], [place] [address]    Assessment:  In Tamilyn Lupien is a 72 y.o. 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    EGD on 03/15/2018 revealed pathology from the duodenal scalloped lesion and duodenal sweep ulcer with involvement with diffuse large B cell lymphoma.  Random gastric biopsy was negative for lymphoma.  There was mild chronic gastritis with reactive foveolar hyperplasia.  Gastric fundus scar revealed involvement with diffuse large B cell lymphoma.  GE junction biopsy revealed no lymphoma with squamocolumnar mucosa with mild chronic active inflammation.  The lymphoma has a non-germinal center immunophenotype with a Ki-67 proliferation index of 60% and shows Bcl-2/CMYC co-expression by  immunohistochemical analysis.   Summary of IHC results, neoplastic cells: Positive: PAX-5, CD20(variable), Bcl-2, MUM1, Bcl-6 (variable), CMYC.  Negative: CD10, CD5, CD30, CD138, Cyclin-D1, ALK-1, CD3.  EBV (ISH): Negative.  FISH for MYC gene rearrangement (8q24 gene region) was negative.  She presented with a normocytic anemia.  She was admitted x 2 (12/2017 and 01/2018) with rectal bleeding.  She is intolerant of oral iron.  Diet is modest.  EGD on 12/30/2017 revealed non  bleeding gastric erosions in the antrum.  Colonoscopy on 12/30/2017 revealed numerous polyps (one 5 mm in ascending colon, two 7 mm polyps in transverse colon, five 5-7 mm polyps in the descending colon, one 7 mm polyp in the sigmoid colon, one 8 mm polyp in the recto-sigmoid colon, and one 19 mm polyp in the rectum). 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    She developed hematochezia post colonoscopy.  Flexible sigmoidoscopy on 12/31/2017 revealed a rectal ulcer at the site of the polypectomy and blood in the rectum.  Clips were placed.  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  She has required 4 units of PRBCs (3 units in 12/2017 and 1 unit in 01/2018).  Ferritin has been followed: 30 on 12/29/2017, 93 on 02/13/2018, and 57 on 03/07/2018.  Labs on 12/29/2017 revealed the following:  B12 was 254 (low normal).  Folate 6.3 (> 5.9).    She has lupus (diagnosed 1982). She is on prednisone 5 mg a day and Plaquenil.  She has chronic renal insufficiency (Cr 1.51; CrCl 23.6 ml/min).  Symptomatically, she has had drenching sweats.  She is not gaining weight.  Exam reveals no adenopathy or hepatosplenomegaly.  Plan: 1. Labs today:  CBC with diff, BMP, LDH, beta2-microglobulin, uric acid, ferritin, B12, folate, HBV sAg, HBV cAb, HCV Ab, HIV, H.pylori 2. Diffuse large B cell lymphoma:  Discuss results of recent EGD and involvement of stomach and duodenum with diffuse large B cell lymphoma.  Discuss staging (bone marrow and PET scan).  Discuss additional testing   Blood: LDH, beta2-microglobulin, uric acid, hepatitis B surface antigen, hepatitis B core antibody, hepatitis C antibody, HIV testing.  Stool: H pylori antigen (? transformation from MALT lymphoma)  Echo:  Assess EF in anticipation of an anthracycline.  Discuss chemotherapy with R-CHOP (standard or  mini based on age). Schedule patient for chemotherapy class.   Discuss central venous device (port) placement. 3. Anemia:  Multi-factorial likely secondary to iron deficiency, B12 deficiency, anemia of chronic renal disease. 4. B12 deficiency:  Patient has been on oral B12 1,000 mcg since 03/07/2018  Will recheck B12 and folate levels today. 5. HYPOkalemia:  Discuss low potassium. Potassium level 3.0  today.  Prescription will be sent in for 20 mEq oral potassium supplement (K-Dur) to be taken daily x 5 days.  6. RTC after PET scan and bone marrow for MD assessment.   Honor Loh, NP  04/03/2018, 2:51 PM   I saw and evaluated the patient, participating in the key portions of the service and reviewing pertinent diagnostic studies and records.  I reviewed the nurse practitioner's note and agree with the findings and the plan.  The assessment and plan were discussed with the patient.  Multiple questions were asked by the patient and answered.   Nolon Stalls, MD 04/03/2018,2:51 PM

## 2018-04-03 NOTE — Progress Notes (Signed)
Pt here for follow up. Per pt " doing well " per husband pt gardens for 1.5 h daily    No voiced c/o

## 2018-04-04 ENCOUNTER — Ambulatory Visit
Admission: RE | Admit: 2018-04-04 | Discharge: 2018-04-04 | Disposition: A | Discharge status: Home / Self Care | Payer: Medicare Other | Source: Ambulatory Visit | Attending: Urgent Care | Admitting: Urgent Care

## 2018-04-04 ENCOUNTER — Other Ambulatory Visit: Payer: Self-pay

## 2018-04-04 ENCOUNTER — Telehealth: Payer: Self-pay | Admitting: *Deleted

## 2018-04-04 DIAGNOSIS — I110 Hypertensive heart disease with heart failure: Secondary | ICD-10-CM | POA: Insufficient documentation

## 2018-04-04 DIAGNOSIS — I071 Rheumatic tricuspid insufficiency: Secondary | ICD-10-CM | POA: Insufficient documentation

## 2018-04-04 DIAGNOSIS — I509 Heart failure, unspecified: Secondary | ICD-10-CM | POA: Diagnosis not present

## 2018-04-04 DIAGNOSIS — D500 Iron deficiency anemia secondary to blood loss (chronic): Secondary | ICD-10-CM

## 2018-04-04 DIAGNOSIS — C8339 Diffuse large B-cell lymphoma, extranodal and solid organ sites: Secondary | ICD-10-CM | POA: Insufficient documentation

## 2018-04-04 DIAGNOSIS — M329 Systemic lupus erythematosus, unspecified: Secondary | ICD-10-CM | POA: Insufficient documentation

## 2018-04-04 LAB — BETA 2 MICROGLOBULIN, SERUM: BETA 2 MICROGLOBULIN: 5.8 mg/L — AB (ref 0.6–2.4)

## 2018-04-04 LAB — HEPATITIS B CORE ANTIBODY, TOTAL: Hep B Core Total Ab: NEGATIVE

## 2018-04-04 LAB — HIV ANTIBODY (ROUTINE TESTING W REFLEX): HIV Screen 4th Generation wRfx: NONREACTIVE

## 2018-04-04 LAB — HEPATITIS C ANTIBODY: HCV Ab: <0.1 s/co ratio (ref 0.0–0.9)

## 2018-04-04 LAB — HEPATITIS B SURFACE ANTIGEN: Hepatitis B Surface Ag: NEGATIVE

## 2018-04-04 NOTE — Telephone Encounter (Signed)
Called patient's residence and spoke with husband to inform him that patient has been scheduled for her bone marrow biopsy for Wednesday, September 25.  Patient to arrive at [facility] @ 7:30 am for 8:30 am procedure.  [name] repeated back to me.

## 2018-04-04 NOTE — Progress Notes (Signed)
*  PRELIMINARY RESULTS* Echocardiogram 2D Echocardiogram has been performed.  Judy Torres 04/04/2018, 11:23 AM

## 2018-04-05 ENCOUNTER — Encounter (INDEPENDENT_AMBULATORY_CARE_PROVIDER_SITE_OTHER): Payer: Self-pay

## 2018-04-05 ENCOUNTER — Telehealth: Payer: Self-pay | Admitting: *Deleted

## 2018-04-05 ENCOUNTER — Telehealth: Payer: Self-pay

## 2018-04-05 ENCOUNTER — Other Ambulatory Visit: Payer: Self-pay | Admitting: Urgent Care

## 2018-04-05 DIAGNOSIS — R931 Abnormal findings on diagnostic imaging of heart and coronary circulation: Secondary | ICD-10-CM

## 2018-04-05 DIAGNOSIS — I330 Acute and subacute infective endocarditis: Secondary | ICD-10-CM

## 2018-04-05 NOTE — Telephone Encounter (Signed)
Patient needs to be seen in the office to discuss TEE.  Please try to schedule him to be seen in the next 1-2 days.  In the meantime, blood cultures should be obtained by Dr. Kem Parkinson office.  If she is concerned that he is acutely infected based on his symptoms and history, further workup may be best pursued on an inpatient basis.  I will forward this to Dr. Mike Gip for her review as well.  Nelva Bush, MD [facility] Pager: [phone]

## 2018-04-05 NOTE — Telephone Encounter (Signed)
Please add on for labs tomorrow BEFORE she goes to chemo class per [facility] 04/05/18 Scheduler message. Patient is aware of her scheduled lab time.

## 2018-04-05 NOTE — Telephone Encounter (Signed)
Dr. Saunders Revel Read Echo ordered by cancer center for patient needing stat treatment for lymphoma   Per results End:   Further evaluation could be   performed with transesophageal echocardiogram if clinical concern   for endocarditis persists.  Next new patient appt in November for any provider .    [place] wants patient seen or TEE ordered asap and patient needs to start treatment asap.  Please advise if Dr. Saunders Revel will order TEE and or contact Dr. Mike Gip if able.  direct cell [phone] / [phone]

## 2018-04-05 NOTE — Telephone Encounter (Signed)
Scheduled 9/19 at [place]

## 2018-04-05 NOTE — Telephone Encounter (Signed)
Called patient and LVM that patient is to come for labs tomorrow @ 8:30, see Dr. Rockey Situ @ 9:20 and PET scan @ 10:30. (Message left to clarify plan for this patient for tomorrow.  She was previously scheduled for chemo class but that will have to be rescheduled).

## 2018-04-05 NOTE — Telephone Encounter (Signed)
Orders placed for 2 sets of peripheral blood cultures. Patient coming in for chemotherapy education class on 04/06/2018 (tomorrow). Will obtain BCx prior to class.   Note sent to scheduling for an encounter. They were asked to make the patient aware.   Honor Loh, MSN, APRN, FNP-C, CEN Oncology/Hematology Nurse Practitioner  [facility] 04/05/18, 1:26 PM

## 2018-04-05 NOTE — Telephone Encounter (Signed)
  Let's do the blood cultures per Dr. Saunders Revel.  M

## 2018-04-06 ENCOUNTER — Ambulatory Visit: Payer: Medicare Other | Admitting: Cardiovascular Disease

## 2018-04-06 ENCOUNTER — Other Ambulatory Visit: Payer: Self-pay

## 2018-04-06 ENCOUNTER — Ambulatory Visit
Admission: RE | Admit: 2018-04-06 | Discharge: 2018-04-06 | Disposition: A | Discharge status: Home / Self Care | Payer: Medicare Other | Source: Ambulatory Visit | Attending: Urgent Care | Admitting: Urgent Care

## 2018-04-06 ENCOUNTER — Inpatient Hospital Stay: Payer: Medicare Other

## 2018-04-06 ENCOUNTER — Encounter: Payer: Self-pay | Admitting: Cardiovascular Disease

## 2018-04-06 VITALS — BP 127/83 | HR 96 | Ht 59.0 in | Wt 102.5 lb

## 2018-04-06 DIAGNOSIS — K922 Gastrointestinal hemorrhage, unspecified: Secondary | ICD-10-CM

## 2018-04-06 DIAGNOSIS — I330 Acute and subacute infective endocarditis: Secondary | ICD-10-CM

## 2018-04-06 DIAGNOSIS — I359 Nonrheumatic aortic valve disorder, unspecified: Secondary | ICD-10-CM | POA: Diagnosis not present

## 2018-04-06 DIAGNOSIS — C8339 Diffuse large B-cell lymphoma, extranodal and solid organ sites: Secondary | ICD-10-CM | POA: Insufficient documentation

## 2018-04-06 DIAGNOSIS — I723 Aneurysm of iliac artery: Secondary | ICD-10-CM | POA: Insufficient documentation

## 2018-04-06 DIAGNOSIS — I700 Atherosclerosis of aorta: Secondary | ICD-10-CM | POA: Diagnosis not present

## 2018-04-06 DIAGNOSIS — R931 Abnormal findings on diagnostic imaging of heart and coronary circulation: Secondary | ICD-10-CM

## 2018-04-06 DIAGNOSIS — I2510 Atherosclerotic heart disease of native coronary artery without angina pectoris: Secondary | ICD-10-CM | POA: Insufficient documentation

## 2018-04-06 DIAGNOSIS — D500 Iron deficiency anemia secondary to blood loss (chronic): Secondary | ICD-10-CM

## 2018-04-06 DIAGNOSIS — R590 Localized enlarged lymph nodes: Secondary | ICD-10-CM | POA: Diagnosis not present

## 2018-04-06 DIAGNOSIS — J439 Emphysema, unspecified: Secondary | ICD-10-CM | POA: Diagnosis not present

## 2018-04-06 LAB — GLUCOSE, CAPILLARY: Glucose-Capillary: 79 mg/dL (ref 70–99)

## 2018-04-06 LAB — H. PYLORI ANTIGEN, STOOL: H. Pylori Stool Ag, Eia: NEGATIVE

## 2018-04-06 MED ORDER — FLUDEOXYGLUCOSE F - 18 (FDG) INJECTION
5.3000 | Freq: Once | INTRAVENOUS | Status: AC | PRN
  Administered 2018-04-06: 5.21 via INTRAVENOUS

## 2018-04-06 NOTE — Telephone Encounter (Signed)
Patient saw Dr Rockey Situ today.

## 2018-04-06 NOTE — Patient Instructions (Signed)

## 2018-04-06 NOTE — Progress Notes (Signed)
Called over to [place] clinic and spoke with Caryl Pina to obtain CD of echo images for provider review. Release form signed and will take to pick up the CD when available.

## 2018-04-06 NOTE — Progress Notes (Addendum)
Cardiology Office Note  Date:  04/06/2018   ID:  Judy Torres, Judy Torres 03/05/1946, MRN [id number]  PCP:  Judy Hire, MD   Chief Complaint  Patient presents with  . OTHER    Discuss echo per Dr. Saunders Revel. Meds reviewed verbally with pt.    HPI:  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 Mike Torres for consultation of her abnormal echocardiogram  She reports that she feels well with no complaints Denies any fevers, diaphoresis, weakness Scheduled later this morning for PET scan This morning had blood work done, blood cultures x2  Feels her appetite is fine, weight stable No chest pain or shortness of breath Active Judy the garden daily  Echocardiogram is ordered to assess ejection fraction Judy anticipation of anthracycline  Previous hospital records were reviewed July 2019 Given blood transfusion for GI bleed EGD , AV malformation cauterized  EKG personally reviewed by myself on todays visit Shows normal sinus rhythm rate 96 bpm right bundle branch block   Echodiagram reviewed Judy detail   Left ventricle the cavity size was normal. Wall thickness was   normal. Systolic function was normal. The estimated ejection   fraction was Judy the range of 55% to 60%. Wall motion was normal;   there were no regional wall motion abnormalities. Doppler   parameters are consistent with abnormal left ventricular   relaxation (grade 1 diastolic dysfunction). Doppler parameters   are consistent with high ventricular filling pressure. - Aortic valve: There is asymmetric thickening of the non-coronary   leaflet with faint echos adjacent to the inflow side of the   leaflet. This most likely represent artifact from adjacent   calcifications, though small vegetation cannot be excluded.   Clinical correlation  recommended. Further evaluation could be   performed with transesophageal echocardiogram if clinical concern   for endocarditis persists. - Mitral valve: Calcified annulus. - Right ventricle: The cavity size was normal. Systolic function   was normal. - Tricuspid valve: There was mild-moderate regurgitation.   PMH:   has a past medical history of Cancer ([place]), Chronic kidney disease, GERD (gastroesophageal reflux disease), HTN (hypertension), Hyperlipidemia, and Lupus ([place]).  PSH:    Past Surgical History:  Procedure Laterality Date  . CARDIAC SURGERY    . CESAREAN SECTION    . COLONOSCOPY N/A 12/30/2017   Procedure: COLONOSCOPY;  Surgeon: Lin Landsman, MD;  Location: [facility] ENDOSCOPY;  Service: Gastroenterology;  Laterality: N/A;  . CORONARY ARTERY BYPASS GRAFT  2003  . ESOPHAGOGASTRODUODENOSCOPY N/A 12/30/2017   Procedure: ESOPHAGOGASTRODUODENOSCOPY (EGD);  Surgeon: Lin Landsman, MD;  Location: [facility] ENDOSCOPY;  Service: Gastroenterology;  Laterality: N/A;  . ESOPHAGOGASTRODUODENOSCOPY Left 02/05/2018   Procedure: ESOPHAGOGASTRODUODENOSCOPY (EGD);  Surgeon: Virgel Manifold, MD;  Location: [facility] ENDOSCOPY;  Service: Endoscopy;  Laterality: Left;  . ESOPHAGOGASTRODUODENOSCOPY (EGD) WITH PROPOFOL N/A 03/15/2018   Procedure: ESOPHAGOGASTRODUODENOSCOPY (EGD) WITH PROPOFOL;  Surgeon: Virgel Manifold, MD;  Location: [facility] ENDOSCOPY;  Service: Endoscopy;  Laterality: N/A;  . FLEXIBLE SIGMOIDOSCOPY N/A 12/31/2017   Procedure: FLEXIBLE SIGMOIDOSCOPY;  Surgeon: Jonathon Bellows, MD;  Location: [facility] ENDOSCOPY;  Service: Gastroenterology;  Laterality: N/A;    Current Outpatient Medications  Medication Sig Dispense Refill  . alendronate (FOSAMAX) 70 MG tablet Take 70 mg by mouth once a week. Saturdays    . amLODipine (NORVASC) 5 MG  tablet Take by mouth.    . clotrimazole-betamethasone (LOTRISONE) cream APPLY TOPICALLY TWICE A DAY AS NEEDED    . ezetimibe-simvastatin (VYTORIN) 10-40 MG  per tablet Take 1 tablet by mouth daily.    . feeding supplement, ENSURE ENLIVE, (ENSURE ENLIVE) LIQD Take 237 mLs by mouth 2 (two) times daily between meals. 90 Bottle 0  . ferrous sulfate 325 (65 FE) MG tablet Take 1 tablet (325 mg total) by mouth 2 (two) times daily with a meal. 60 tablet 3  . hydrochlorothiazide (HYDRODIURIL) 25 MG tablet Take by mouth.    . hydroxychloroquine (PLAQUENIL) 200 MG tablet TAKE 1 TABLET BY MOUTH TWICE A DAY    . Multiple Vitamin (MULTIVITAMIN WITH MINERALS) TABS tablet Take 1 tablet by mouth daily. 180 tablet 0  . pantoprazole (PROTONIX) 40 MG tablet Take 1 tablet (40 mg total) by mouth 2 (two) times daily before a meal. 60 tablet 1  . polyethylene glycol (MIRALAX / GLYCOLAX) packet Take 17 g by mouth daily.    . predniSONE (DELTASONE) 5 MG tablet Take 5 mg by mouth.    . Pseudoephedrine-APAP 30-325 MG TABS Take by mouth.    . ranitidine (ZANTAC) 150 MG tablet Take by mouth.    . simvastatin (ZOCOR) 40 MG tablet Take by mouth.    . traMADol (ULTRAM) 50 MG tablet Take by mouth.     No current facility-administered medications for this visit.      Allergies:   Penicillins; Sulfa antibiotics; Sulfasalazine; Ace inhibitors; Losartan; and Spironolactone   Social History:  The patient  reports that she quit smoking about 16 years ago. Her smoking use included cigarettes. She has a 10.00 pack-year smoking history. She has never used smokeless tobacco. She reports that she drank alcohol. She reports that she has current or past drug history.   Family History:   family history includes Hypertension Judy her mother and sister; Stroke Judy her mother; Vascular Disease Judy her mother.    Review of Systems: Review of Systems  Constitutional: Negative.   Respiratory: Negative.   Cardiovascular: Negative.   Gastrointestinal: Negative.   Musculoskeletal: Negative.   Neurological: Negative.   Psychiatric/Behavioral: Negative.   All other systems reviewed and are  negative.   PHYSICAL EXAM: VS:  BP 127/83 (BP Location: Right Arm, Patient Position: Sitting, Cuff Size: Normal)   Pulse 96   Ht 4\' 11"  (1.499 m)   Wt 102 lb 8 oz (46.5 kg)   BMI 20.70 kg/m  , BMI Body mass index is 20.7 kg/m. GEN: Well nourished, well developed, Judy no acute distress  HEENT: normal  Neck: no JVD, carotid bruits, or masses Cardiac: RRR; no murmurs, rubs, or gallops,no edema  Respiratory:  clear to auscultation bilaterally, normal work of breathing GI: soft, nontender, nondistended, + BS MS: no deformity or atrophy  Skin: warm and dry, no rash Neuro:  Strength and sensation are intact Psych: euthymic mood, full affect   Recent Labs: 01/01/2018: Magnesium 1.7 03/07/2018: ALT 16; TSH 8.529 04/03/2018: BUN 26; Creatinine, Ser 1.43; Hemoglobin 10.7; Platelets 207; Potassium 3.0; Sodium 136    Lipid Panel No results found for: CHOL, HDL, LDLCALC, TRIG    Wt Readings from Last 3 Encounters:  04/06/18 102 lb 8 oz (46.5 kg)  04/03/18 102 lb 1.6 oz (46.3 kg)  03/30/18 101 lb 12.8 oz (46.2 kg)       ASSESSMENT AND PLAN:  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 (HCC)  Managed by oncology Normal ejection fraction She would be acceptable candidate for anthracyclines  Abnormal echocardiogram - Plan: EKG 12-Lead Echocardiogram reviewed personally by myself Valve is well visualized Judy parasternal short axis images Mild to moderate calcification noted to the noncoronary cusp No mobile vegetation noted Blood cultures have been drawn Clinically appears well with no features concerning for endocarditis If cultures negative, no strong indication for transesophageal echo As report details most likely a artifact from calcification --For additional confirmation we have requested a CD with prior echo from February 2019 performed at [facility] for our review.  Aortic valve will be reviewed  Addendum: Echocardiogram from  February 2019 was reviewed personally by myself Images look identical to most recent echocardiogram Aortic valve calcification noted No mobile vegetation Day four: blood cultures not back yet still pending  Aortic valve disease As detailed above Calcified aortic valve No mobile vegetation noted.   valve well visualized  Gastrointestinal hemorrhage, unspecified gastrointestinal hemorrhage type Prior bleeding x2 prior hospitalization July 2019 Reports symptoms stable  Iron deficiency anemia due to chronic blood loss Managed by oncology  Disposition:   F/U as needed   Total encounter time more than 60 minutes  Greater than 50% was spent Judy counseling and coordination of care with the patient    Orders Placed This Encounter  Procedures  . EKG 12-Lead     Signed, Esmond Plants, M.D., Ph.D. 04/06/2018  [place], [place]

## 2018-04-07 ENCOUNTER — Telehealth: Payer: Self-pay | Admitting: Cardiovascular Disease

## 2018-04-07 NOTE — Telephone Encounter (Signed)
Routing to Dr Rockey Situ to review. He saw patient in clinic yesterday. Under CV procedure tab.

## 2018-04-07 NOTE — Telephone Encounter (Signed)
Scanned Cath from 2004 to chart unable to route to triage form Review.

## 2018-04-09 NOTE — Telephone Encounter (Signed)
Pam requested echocardiogram from earlier in 2019 from [facility] We need this on a CD uploaded on our system so we can see pictures

## 2018-04-10 ENCOUNTER — Telehealth: Payer: Self-pay | Admitting: Cardiovascular Disease

## 2018-04-10 ENCOUNTER — Inpatient Hospital Stay: Payer: Medicare Other

## 2018-04-10 ENCOUNTER — Other Ambulatory Visit: Payer: Medicare Other

## 2018-04-10 ENCOUNTER — Ambulatory Visit: Payer: Medicare Other

## 2018-04-10 ENCOUNTER — Ambulatory Visit: Payer: Medicare Other | Admitting: Hematology and Oncology

## 2018-04-10 NOTE — Telephone Encounter (Signed)
CD of echocardiogram images received and given to Dr. Rockey Situ for his review.

## 2018-04-10 NOTE — Telephone Encounter (Signed)
Called over to [facility] and requested echocardiogram images on CD so that we can pick up for provider review as soon as possible given patients pending procedures. She entered note and sent to physicians nurse and will have them call back.

## 2018-04-11 ENCOUNTER — Other Ambulatory Visit: Payer: Self-pay | Admitting: Student

## 2018-04-11 ENCOUNTER — Other Ambulatory Visit: Payer: Self-pay | Admitting: Radiology

## 2018-04-11 LAB — CULTURE, BLOOD (ROUTINE X 2)
CULTURE: NO GROWTH
Culture: NO GROWTH
Special Requests: ADEQUATE
Special Requests: ADEQUATE

## 2018-04-12 ENCOUNTER — Ambulatory Visit
Admission: RE | Admit: 2018-04-12 | Discharge: 2018-04-12 | Disposition: A | Discharge status: Home / Self Care | Payer: Medicare Other | Source: Ambulatory Visit | Attending: Urgent Care | Admitting: Urgent Care

## 2018-04-12 ENCOUNTER — Other Ambulatory Visit: Payer: Self-pay | Admitting: Student

## 2018-04-12 ENCOUNTER — Other Ambulatory Visit (HOSPITAL_COMMUNITY)
Admission: RE | Admit: 2018-04-12 | Disposition: A | Discharge status: Home / Self Care | Payer: Medicare Other | Source: Ambulatory Visit | Attending: Hematology and Oncology | Admitting: Hematology and Oncology

## 2018-04-12 DIAGNOSIS — C8590 Non-Hodgkin lymphoma, unspecified, unspecified site: Secondary | ICD-10-CM | POA: Diagnosis present

## 2018-04-12 DIAGNOSIS — Z7952 Long term (current) use of systemic steroids: Secondary | ICD-10-CM | POA: Insufficient documentation

## 2018-04-12 DIAGNOSIS — M329 Systemic lupus erythematosus, unspecified: Secondary | ICD-10-CM | POA: Insufficient documentation

## 2018-04-12 DIAGNOSIS — Z8249 Family history of ischemic heart disease and other diseases of the circulatory system: Secondary | ICD-10-CM | POA: Insufficient documentation

## 2018-04-12 DIAGNOSIS — I2510 Atherosclerotic heart disease of native coronary artery without angina pectoris: Secondary | ICD-10-CM | POA: Diagnosis not present

## 2018-04-12 DIAGNOSIS — N189 Chronic kidney disease, unspecified: Secondary | ICD-10-CM | POA: Insufficient documentation

## 2018-04-12 DIAGNOSIS — Z880 Allergy status to penicillin: Secondary | ICD-10-CM | POA: Diagnosis not present

## 2018-04-12 DIAGNOSIS — C8339 Diffuse large B-cell lymphoma, extranodal and solid organ sites: Secondary | ICD-10-CM | POA: Diagnosis not present

## 2018-04-12 DIAGNOSIS — I129 Hypertensive chronic kidney disease with stage 1 through stage 4 chronic kidney disease, or unspecified chronic kidney disease: Secondary | ICD-10-CM | POA: Insufficient documentation

## 2018-04-12 DIAGNOSIS — K219 Gastro-esophageal reflux disease without esophagitis: Secondary | ICD-10-CM | POA: Insufficient documentation

## 2018-04-12 DIAGNOSIS — E785 Hyperlipidemia, unspecified: Secondary | ICD-10-CM | POA: Insufficient documentation

## 2018-04-12 DIAGNOSIS — Z85028 Personal history of other malignant neoplasm of stomach: Secondary | ICD-10-CM | POA: Insufficient documentation

## 2018-04-12 DIAGNOSIS — D500 Iron deficiency anemia secondary to blood loss (chronic): Secondary | ICD-10-CM | POA: Insufficient documentation

## 2018-04-12 DIAGNOSIS — Z882 Allergy status to sulfonamides status: Secondary | ICD-10-CM | POA: Insufficient documentation

## 2018-04-12 DIAGNOSIS — Z87891 Personal history of nicotine dependence: Secondary | ICD-10-CM | POA: Diagnosis not present

## 2018-04-12 DIAGNOSIS — Z79899 Other long term (current) drug therapy: Secondary | ICD-10-CM | POA: Insufficient documentation

## 2018-04-12 HISTORY — DX: Peripheral vascular angioplasty status with implants and grafts: Z95.820

## 2018-04-12 LAB — APTT: aPTT: 26 seconds (ref 24–36)

## 2018-04-12 LAB — CBC WITH DIFFERENTIAL/PLATELET
BAND NEUTROPHILS: 0 %
BLASTS: 0 %
Basophils Absolute: 0 10*3/uL (ref 0–0.1)
Basophils Relative: 0 %
Eosinophils Absolute: 0.3 10*3/uL (ref 0–0.7)
Eosinophils Relative: 5 %
HEMATOCRIT: 34.2 % — AB (ref 35.0–47.0)
HEMOGLOBIN: 11.5 g/dL — AB (ref 12.0–16.0)
LYMPHS PCT: 22 %
Lymphs Abs: 1.1 10*3/uL (ref 1.0–3.6)
MCH: 30.7 pg (ref 26.0–34.0)
MCHC: 33.7 g/dL (ref 32.0–36.0)
MCV: 91.1 fL (ref 80.0–100.0)
MONOS PCT: 15 %
Metamyelocytes Relative: 0 %
Monocytes Absolute: 0.8 10*3/uL (ref 0.2–0.9)
Myelocytes: 1 %
NEUTROS ABS: 3 10*3/uL (ref 1.4–6.5)
NEUTROS PCT: 57 %
NRBC: 0 /100{WBCs}
OTHER: 0 %
PROMYELOCYTES RELATIVE: 0 %
Platelets: 222 10*3/uL (ref 150–440)
RBC: 3.75 MIL/uL — AB (ref 3.80–5.20)
RDW: 14.2 % (ref 11.5–14.5)
WBC: 5.2 10*3/uL (ref 3.6–11.0)

## 2018-04-12 LAB — PROTIME-INR
INR: 0.84
PROTHROMBIN TIME: 11.4 s (ref 11.4–15.2)

## 2018-04-12 LAB — PATHOLOGIST SMEAR REVIEW

## 2018-04-12 MED ORDER — LIDOCAINE HCL (PF) 1 % IJ SOLN
INTRAMUSCULAR | Status: AC | PRN
  Administered 2018-04-12: 3 mL

## 2018-04-12 MED ORDER — FENTANYL CITRATE (PF) 100 MCG/2ML IJ SOLN
INTRAMUSCULAR | Status: AC
  Filled 2018-04-12: qty 4

## 2018-04-12 MED ORDER — SODIUM CHLORIDE 0.9 % IV SOLN
INTRAVENOUS | Status: DC
  Administered 2018-04-12: 08:00:00 via INTRAVENOUS

## 2018-04-12 MED ORDER — MIDAZOLAM HCL 5 MG/5ML IJ SOLN
INTRAMUSCULAR | Status: AC | PRN
  Administered 2018-04-12 (×3): 0.5 mg via INTRAVENOUS
  Administered 2018-04-12: 1 mg via INTRAVENOUS

## 2018-04-12 MED ORDER — HEPARIN SOD (PORK) LOCK FLUSH 100 UNIT/ML IV SOLN
INTRAVENOUS | Status: AC
  Filled 2018-04-12: qty 5

## 2018-04-12 MED ORDER — FENTANYL CITRATE (PF) 100 MCG/2ML IJ SOLN
INTRAMUSCULAR | Status: AC | PRN
  Administered 2018-04-12: 25 ug via INTRAVENOUS
  Administered 2018-04-12: 50 ug via INTRAVENOUS

## 2018-04-12 MED ORDER — MIDAZOLAM HCL 5 MG/5ML IJ SOLN
INTRAMUSCULAR | Status: AC
  Filled 2018-04-12: qty 5

## 2018-04-12 NOTE — Consult Note (Signed)
Chief Complaint: Lymphoma  Referring Physician(s): Pearline Cables (NP) Mike Gip (Oncology)  Patient Status: [facility] - Out-pt  History of Present Illness: [name] is a 72 y.o. female with past medical history significant for stomach cancer, chronic kidney disease, hypertension, hyperlipidemia lupus, CAD (post median sternotomy) and PAD, post femorofemoral bypass grafting and recent diagnosis of lymphoma who presents today for CT-guided bone marrow biopsy for tissue diagnostic purposes.  The patient is accompanied by her husband though serves as her own historian.  Patient is currently without complaint.  Specifically, no chest pain, cough, fever or chills.  No unintentional weight loss or weight gain.  No change in appetite or energy level.    Past Medical History:  Diagnosis Date  . Cancer [facility])    Stomach cancer  . Chronic kidney disease   . GERD (gastroesophageal reflux disease)   . HTN (hypertension)   . Hyperlipidemia   . Lupus ([place])   . S/P angioplasty with stent     Past Surgical History:  Procedure Laterality Date  . CARDIAC SURGERY    . CESAREAN SECTION    . COLONOSCOPY N/A 12/30/2017   Procedure: COLONOSCOPY;  Surgeon: Lin Landsman, MD;  Location: [facility] ENDOSCOPY;  Service: Gastroenterology;  Laterality: N/A;  . CORONARY ARTERY BYPASS GRAFT  2003  . ESOPHAGOGASTRODUODENOSCOPY N/A 12/30/2017   Procedure: ESOPHAGOGASTRODUODENOSCOPY (EGD);  Surgeon: Lin Landsman, MD;  Location: [facility] ENDOSCOPY;  Service: Gastroenterology;  Laterality: N/A;  . ESOPHAGOGASTRODUODENOSCOPY Left 02/05/2018   Procedure: ESOPHAGOGASTRODUODENOSCOPY (EGD);  Surgeon: Virgel Manifold, MD;  Location: [facility] ENDOSCOPY;  Service: Endoscopy;  Laterality: Left;  . ESOPHAGOGASTRODUODENOSCOPY (EGD) WITH PROPOFOL N/A 03/15/2018   Procedure: ESOPHAGOGASTRODUODENOSCOPY (EGD) WITH PROPOFOL;  Surgeon: Virgel Manifold, MD;  Location: [facility] ENDOSCOPY;  Service: Endoscopy;  Laterality: N/A;  .  FLEXIBLE SIGMOIDOSCOPY N/A 12/31/2017   Procedure: FLEXIBLE SIGMOIDOSCOPY;  Surgeon: Jonathon Bellows, MD;  Location: [facility] ENDOSCOPY;  Service: Gastroenterology;  Laterality: N/A;    Allergies: Penicillins; Sulfa antibiotics; Sulfasalazine; Ace inhibitors; Losartan; and Spironolactone  Medications: Prior to Admission medications   Medication Sig Start Date End Date Taking? Authorizing Provider  amLODipine (NORVASC) 5 MG tablet Take by mouth. 09/07/16  Yes [provider]  clotrimazole-betamethasone (LOTRISONE) cream APPLY TOPICALLY TWICE A DAY AS NEEDED 08/24/16  Yes [provider]  ezetimibe-simvastatin (VYTORIN) 10-40 MG per tablet Take 1 tablet by mouth daily.   Yes [provider]  feeding supplement, ENSURE ENLIVE, (ENSURE ENLIVE) LIQD Take 237 mLs by mouth 2 (two) times daily between meals. 01/02/18  Yes Salary, Avel Peace, MD  ferrous sulfate 325 (65 FE) MG tablet Take 1 tablet (325 mg total) by mouth 2 (two) times daily with a meal. 02/06/18  Yes Vaughan Basta, MD  hydrochlorothiazide (HYDRODIURIL) 25 MG tablet Take by mouth. 08/15/14  Yes [provider]  hydroxychloroquine (PLAQUENIL) 200 MG tablet TAKE 1 TABLET BY MOUTH TWICE A DAY 01/22/14  Yes [provider]  Multiple Vitamin (MULTIVITAMIN WITH MINERALS) TABS tablet Take 1 tablet by mouth daily. 01/03/18  Yes Salary, Avel Peace, MD  pantoprazole (PROTONIX) 40 MG tablet Take 1 tablet (40 mg total) by mouth 2 (two) times daily before a meal. 03/15/18  Yes Tahiliani, Margretta Sidle B, MD  polyethylene glycol (MIRALAX / GLYCOLAX) packet Take 17 g by mouth daily.   Yes [provider]  predniSONE (DELTASONE) 5 MG tablet Take 5 mg by mouth. 12/12/13  Yes [provider]  Pseudoephedrine-APAP 30-325 MG TABS Take by mouth.   Yes  [provider]  ranitidine (ZANTAC) 150 MG tablet Take by mouth.   Yes [provider]  simvastatin (ZOCOR) 40 MG tablet Take by mouth. 08/15/14   Yes [provider]  traMADol (ULTRAM) 50 MG tablet Take by mouth. 09/05/17  Yes [provider]  alendronate (FOSAMAX) 70 MG tablet Take 70 mg by mouth once a week. Saturdays    [provider]     Family History  Problem Relation Age of Onset  . Hypertension Mother   . Stroke Mother   . Vascular Disease Mother   . Hypertension Sister     Social History   Socioeconomic History  . Marital status: Married    Spouse name: Not on file  . Number of children: Not on file  . Years of education: Not on file  . Highest education level: Not on file  Occupational History  . Not on file  Social Needs  . Financial resource strain: Not hard at all  . Food insecurity:    Worry: Never true    Inability: Not on file  . Transportation needs:    Medical: Patient refused    Non-medical: Patient refused  Tobacco Use  . Smoking status: Former Smoker    Packs/day: 0.50    Years: 20.00    Pack years: 10.00    Types: Cigarettes    Last attempt to quit: 03/07/2002    Years since quitting: 16.1  . Smokeless tobacco: Never Used  Substance and Sexual Activity  . Alcohol use: Not Currently  . Drug use: Never  . Sexual activity: Not on file  Lifestyle  . Physical activity:    Days per week: Not on file    Minutes per session: Not on file  . Stress: Not at all  Relationships  . Social connections:    Talks on phone: Not on file    Gets together: Not on file    Attends religious service: Not on file    Active member of club or organization: Not on file    Attends meetings of clubs or organizations: Not on file    Relationship status: Not on file  Other Topics Concern  . Not on file  Social History Narrative  . Not on file    ECOG Status: 1 - Symptomatic but completely ambulatory  Review of Systems: A 12 point ROS discussed and pertinent positives are indicated in the HPI above.  All other systems are negative.  Review of Systems  Constitutional: Negative  for activity change, appetite change, fatigue and fever.  Respiratory: Negative.   Cardiovascular: Negative.     Vital Signs: BP (!) 142/86   Pulse 87   Temp 97.8 F (36.6 C) (Oral)   Resp 17   SpO2 100%   Physical Exam  Constitutional: She appears well-developed and well-nourished.  HENT:  Head: Normocephalic and atraumatic.  Cardiovascular:  Murmur heard. Holosystolic murmur  Pulmonary/Chest: Effort normal and breath sounds normal.  Skin: Skin is warm and dry.  Psychiatric: She has a normal mood and affect. Her behavior is normal.  Nursing note and vitals reviewed.   Imaging: Nm Pet Image Initial (pi) Skull Base To Thigh  Result Date: 04/06/2018 CLINICAL DATA:  Initial treatment strategy for lymphoma. EXAM: NUCLEAR MEDICINE PET SKULL BASE TO THIGH TECHNIQUE: 5.2 mCi F-18 FDG was injected intravenously. Full-ring PET imaging was performed from the skull base to thigh after the radiotracer. CT data was obtained and used for attenuation correction and anatomic localization.  Fasting blood glucose: 79 mg/dl COMPARISON:  None FINDINGS: Mediastinal blood pool activity: SUV max 1.79 NECK: Hypermetabolic left level 2 node has an SUV max of 4.44. Small hypermetabolic left posterior cervical lymph nodes have an SUV max of 4.27. Incidental CT findings: none CHEST: Single hypermetabolic right axillary lymph node measures 0.8 cm within SUV max of 5.8. Bilateral hypermetabolic supraclavicular lymph nodes are noted. Index left supraclavicular lymph node measures 1.5 cm and has an SUV max 13.18. Hypermetabolic bilateral mediastinal and subcarinal lymph nodes identified. Index right paratracheal lymph node measures 2.4 cm within SUV max 24.86. Hypermetabolic subcarinal lymph node measures 5.7 x 2.0 cm and has an SUV max of 26.78. Incidental CT findings: No pleural effusion identified. Mild changes of emphysema. No hypermetabolic pulmonary nodules or masses identified. Aortic atherosclerosis identified.  Multi vessel coronary artery atherosclerotic calcifications noted. ABDOMEN/PELVIS: No abnormal hypermetabolic activity within the liver, pancreas, adrenal glands, or spleen. Enlarged and hypermetabolic abdominal lymph nodes identified. Index aortocaval lymph node posterior to the pancreas measures 2.1 cm and has an SUV max 17.98. Index left retroperitoneal lymph node measures 2.7 cm within SUV max 24.94. Hypermetabolic right pelvic sidewall lymph node measures 8 mm and has an SUV max of 7.9. Left common iliac node just below the bifurcation measures 0.8 cm and has an SUV max of 4.34. Incidental CT findings: No evidence for splenomegaly. Extensive aortic atherosclerosis noted. The patient is status post stent graft repair of infrarenal abdominal aortic aneurysm. 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 Femoral to femoral bypass graft is present. Left kidney cyst containing internal septation with calcification is noted. Incompletely characterized without IV contrast. SKELETON: No focal hypermetabolic activity to suggest skeletal metastasis. Incidental CT findings: none IMPRESSION: 1. Exam positive for enlarged and hypermetabolic supraclavicular, mediastinal, abdominal and pelvic adenopathy compatible with the clinical history of lymphoma. Additional smaller hypermetabolic lymph nodes identified within the left cervical lymph node chain. 2. Aortic Atherosclerosis (ICD10-I70.0) and Emphysema (ICD10-J43.9). 3. Extensive calcified atherosclerotic disease status post aortic stent graft repair and femoral to femoral bypass grafting. 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 Electronically Signed   By: Kerby Moors M.D.   On: 04/06/2018 14:32    Labs:  CBC: Recent Labs    02/13/18 1132 03/07/18 1301 04/03/18 1414 04/12/18 0736  WBC 8.7 9.5 1.8* 5.2  HGB 8.3* 11.4* 10.7* 11.5*  HCT 25.3* 34.3* 32.5* 34.2*  PLT 304 304 207 222     COAGS: Recent Labs    02/04/18 2331 04/12/18 0736  INR 0.93 0.84  APTT <24* 26    BMP: Recent Labs    02/04/18 2300 02/05/18 0348 03/07/18 1301 04/03/18 1414  NA 138 141 138 136  K 4.2 3.8 3.4* 3.0*  CL 104 114* 100 99  CO2 21* 21* 26 25  GLUCOSE 208* 89 107* 189*  BUN 41* 36* 32* 26*  CALCIUM 9.2 7.8* 9.7 9.7  CREATININE 1.69* 1.17* 1.51* 1.43*  GFRNONAA 30* 46* 34* 36*  GFRAA 34* 53* 39* 42*    LIVER FUNCTION TESTS: Recent Labs    12/28/17 1928 01/01/18 0505 02/04/18 2300 03/07/18 1301  BILITOT 0.4 0.8 0.4 0.5  AST 28 23 35 30  ALT 12* 10* 13 16  ALKPHOS 37* 23* 46 67  PROT 5.5* 4.1* 5.3* 6.9  ALBUMIN 3.2* 2.6* 3.0* 4.2    TUMOR MARKERS: No results for input(s): AFPTM, CEA, CA199, CHROMGRNA in the last 8760 hours.  Assessment and Plan:  [name] is a 72 y.o. female with past medical history significant for stomach cancer, chronic kidney disease, hypertension, hyperlipidemia lupus, CAD (post median sternotomy) and PAD, post femorofemoral bypass grafting and recent diagnosis of lymphoma who presents today for CT-guided bone marrow biopsy for tissue diagnostic purposes.    Risks and benefits of CT-guided bone marrow biopsy and aspiration was discussed with the patient including, but not limited to bleeding, infection, damage to adjacent structures or low yield requiring additional tests.  All of the patient's questions were answered, patient is agreeable to proceed. Consent signed and in chart.  Thank you for this interesting consult.  I greatly enjoyed meeting In [place] and look forward to participating in their care.  A copy of this report was sent to the requesting provider on this date.  Electronically Signed: Sandi Mariscal, MD 04/12/2018, 8:38 AM   I spent a total of 15 Minutes in face to face in clinical consultation, greater than 50% of which was counseling/coordinating care for CT guided BM Bx.

## 2018-04-12 NOTE — Procedures (Signed)
Pre-procedure Diagnosis: Lymphoma Post-procedure Diagnosis: Same  Technically successful CT guided bone marrow aspiration and biopsy of left iliac crest.   Complications: None Immediate  EBL: None  SignedSandi Mariscal Pager: [phone] 04/12/2018, 9:17 AM

## 2018-04-14 ENCOUNTER — Other Ambulatory Visit (INDEPENDENT_AMBULATORY_CARE_PROVIDER_SITE_OTHER): Payer: Self-pay | Admitting: Nurse Practitioner

## 2018-04-15 ENCOUNTER — Encounter: Payer: Self-pay | Admitting: Hematology and Oncology

## 2018-04-15 DIAGNOSIS — Z7189 Other specified counseling: Secondary | ICD-10-CM | POA: Insufficient documentation

## 2018-04-17 ENCOUNTER — Other Ambulatory Visit: Payer: Self-pay | Admitting: Hematology and Oncology

## 2018-04-17 ENCOUNTER — Inpatient Hospital Stay (HOSPITAL_BASED_OUTPATIENT_CLINIC_OR_DEPARTMENT_OTHER): Payer: Medicare Other | Admitting: Hematology and Oncology

## 2018-04-17 ENCOUNTER — Other Ambulatory Visit: Payer: Self-pay

## 2018-04-17 ENCOUNTER — Encounter: Payer: Self-pay | Admitting: Hematology and Oncology

## 2018-04-17 VITALS — BP 116/69 | HR 83 | Temp 99.6°F | Wt 102.7 lb

## 2018-04-17 DIAGNOSIS — C8339 Diffuse large B-cell lymphoma, extranodal and solid organ sites: Secondary | ICD-10-CM

## 2018-04-17 DIAGNOSIS — N189 Chronic kidney disease, unspecified: Secondary | ICD-10-CM

## 2018-04-17 DIAGNOSIS — N184 Chronic kidney disease, stage 4 (severe): Secondary | ICD-10-CM

## 2018-04-17 DIAGNOSIS — D6489 Other specified anemias: Secondary | ICD-10-CM | POA: Diagnosis not present

## 2018-04-17 DIAGNOSIS — E538 Deficiency of other specified B group vitamins: Secondary | ICD-10-CM

## 2018-04-17 DIAGNOSIS — Z7189 Other specified counseling: Secondary | ICD-10-CM

## 2018-04-17 DIAGNOSIS — E876 Hypokalemia: Secondary | ICD-10-CM

## 2018-04-17 DIAGNOSIS — D72819 Decreased white blood cell count, unspecified: Secondary | ICD-10-CM

## 2018-04-17 DIAGNOSIS — E611 Iron deficiency: Secondary | ICD-10-CM | POA: Diagnosis not present

## 2018-04-17 DIAGNOSIS — I129 Hypertensive chronic kidney disease with stage 1 through stage 4 chronic kidney disease, or unspecified chronic kidney disease: Secondary | ICD-10-CM | POA: Diagnosis not present

## 2018-04-17 DIAGNOSIS — D500 Iron deficiency anemia secondary to blood loss (chronic): Secondary | ICD-10-CM

## 2018-04-17 DIAGNOSIS — C83398 Diffuse large b-cell lymphoma of other extranodal and solid organ sites: Secondary | ICD-10-CM

## 2018-04-17 MED ORDER — ALLOPURINOL 100 MG PO TABS: 50.0000 mg | ORAL_TABLET | Freq: Every day | ORAL | 0 refills | Status: DC

## 2018-04-17 NOTE — Progress Notes (Signed)
[facility] day:  04/17/2018  Chief Complaint: [name] is a 72 y.o. female with diffuse large B cell lymphoma who is seen for review of work-up and discussion regarding direction of therapy.  HPI: The patient was last seen in the medical oncology clinic on 04/03/2018.  At that time, she described drenching sweats.  She was not gaining weight.  Exam revealed no adenopathy or hepatosplenomegaly.  Recent gastric and duodenal biopsies revealed diffuse large B cell lymphoma.  She underwent further work-up.  CBC revealed a hematocrit of 32.5, hemoglobin 10.7, MCV 91.7, platelets 207,000, WBC 1800 with an ANC of 600.  Creatinine was 1.43 (CrCl 25 ml/min).  Uric acid was 8.8.  LDH was 241 (98-192).  Beta2-microglobulin was 5.8 (0.6-2.4).  Negative studies included:  Hepatitis B core antibody total, hepatitis B surface antigen, hepatitis C antibody, HIV testing.  Ferritin was 38.  Vitamin B12 was 1524.  Folate was 55.8.    Echo on 04/04/2018 revealed an EF of 55-60%.  There was grade I diastolic dysfunction.  There was asymmetric thickening of the non-coronary leaflet of the aortic valve with faint echos adjacent to the inflow site of the leaflet.  Most likely represented artifact from adjacent calcifications, though a small vegetation could not be excluded.  She was seen by Dr. Rockey Situ on 04/06/2018.  Echo reviewed.  There was no mobile vegetation.  Blood cultures were drawn.  Clinically appeared well with no features concerning for endocarditis.  If cultures negative, no strong indication for TEE.  Echo from 08/2017 was reviewed with no change in imaging noted.  PET scan on 04/06/2018 revealed enlarged and hypermetabolic supraclavicular, mediastinal, abdominal and pelvic adenopathy compatible with the clinical history of lymphoma. Additional smaller hypermetabolic lymph nodes identified within the left cervical lymph node chain.  There was aortic  atherosclerosis and emphysema.  There was extensive calcified atherosclerotic disease status post aortic stent graft repair and femoral to femoral bypass grafting.  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  There were dyspoietic changes associated with the granulocyte series.  Flow cytometry revealed a predominance of T lymphocytes with relative abundance of CD8 + cells.  There was no significant B cell population.  Cytogenetics are pending.  During the interim, patient is feeling "alright". She continues to having intermittent episodes of drenching diaphoresis at night. She denies fevers and chills. Patient notes that her stomach is "ok". She denies any nausea, vomiting, or changes to her bowel habits.   She denies the use of new medications or herbal supplements. 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 She has been followed by Dr. Jannifer Hick (nephrology) at [facility], who has recently left the practice. Patient is scheduled to see a new nephrologist in November, however she is unsure the name of her new provider.   Patient advises that she maintains an adequate appetite. She is eating well. Weight today is 102 lb 11.2 oz (46.6 kg), which compared to her last visit to the clinic, represents a stable weight.    Patient denies pain in the clinic today.   Past Medical History:  Diagnosis Date  . Cancer [facility])    Stomach cancer  . Chronic kidney disease   . GERD (gastroesophageal reflux disease)   . HTN (hypertension)   . Hyperlipidemia   . Lupus (  [place])   . S/P angioplasty with stent     Past Surgical History:  Procedure Laterality Date  . CARDIAC SURGERY    . CESAREAN SECTION    . COLONOSCOPY N/A 12/30/2017   Procedure: COLONOSCOPY;  Surgeon: Lin Landsman, MD;  Location: [facility] ENDOSCOPY;  Service:  Gastroenterology;  Laterality: N/A;  . CORONARY ARTERY BYPASS GRAFT  2003  . ESOPHAGOGASTRODUODENOSCOPY N/A 12/30/2017   Procedure: ESOPHAGOGASTRODUODENOSCOPY (EGD);  Surgeon: Lin Landsman, MD;  Location: [facility] ENDOSCOPY;  Service: Gastroenterology;  Laterality: N/A;  . ESOPHAGOGASTRODUODENOSCOPY Left 02/05/2018   Procedure: ESOPHAGOGASTRODUODENOSCOPY (EGD);  Surgeon: Virgel Manifold, MD;  Location: [facility] ENDOSCOPY;  Service: Endoscopy;  Laterality: Left;  . ESOPHAGOGASTRODUODENOSCOPY (EGD) WITH PROPOFOL N/A 03/15/2018   Procedure: ESOPHAGOGASTRODUODENOSCOPY (EGD) WITH PROPOFOL;  Surgeon: Virgel Manifold, MD;  Location: [facility] ENDOSCOPY;  Service: Endoscopy;  Laterality: N/A;  . FLEXIBLE SIGMOIDOSCOPY N/A 12/31/2017   Procedure: FLEXIBLE SIGMOIDOSCOPY;  Surgeon: Jonathon Bellows, MD;  Location: [facility] ENDOSCOPY;  Service: Gastroenterology;  Laterality: N/A;    Family History  Problem Relation Age of Onset  . Hypertension Mother   . Stroke Mother   . Vascular Disease Mother   . Hypertension Sister     Social History:  reports that she quit smoking about 16 years ago. Her smoking use included cigarettes. She has a 10.00 pack-year smoking history. She has never used smokeless tobacco. She reports that she drank alcohol. She reports that she does not use drugs.  She has smoked 1/2 pack/day x 20 years.  She stopped smoking in 2000.  She denies any exposure to radiation or toxins.  She is from [place].  She moved to the [place] 52 years ago after marrying her husband.  She was in the [employer].  The patient is accompanied by her husband and daughter today.  Allergies:  Allergies  Allergen Reactions  . Penicillins Other (See Comments)    Pass out  . Sulfa Antibiotics Nausea Only  . Sulfasalazine Other (See Comments)    Other reaction(s): NOT KNOWN   . Ace Inhibitors Nausea Only  . Losartan Anxiety    Other reaction(s): OTHER Other reaction(s): OTHER   . Spironolactone Other (See  Comments)    Felt poorly but couldn't clarify Felt poorly but couldn't clarify     Current Medications: Current Outpatient Medications  Medication Sig Dispense Refill  . alendronate (FOSAMAX) 70 MG tablet Take 70 mg by mouth once a week. Saturdays    . amLODipine (NORVASC) 5 MG tablet Take by mouth.    . clotrimazole-betamethasone (LOTRISONE) cream APPLY TOPICALLY TWICE A DAY AS NEEDED    . ezetimibe-simvastatin (VYTORIN) 10-40 MG per tablet Take 1 tablet by mouth daily.    . feeding supplement, ENSURE ENLIVE, (ENSURE ENLIVE) LIQD Take 237 mLs by mouth 2 (two) times daily between meals. 90 Bottle 0  . ferrous sulfate 325 (65 FE) MG tablet Take 1 tablet (325 mg total) by mouth 2 (two) times daily with a meal. 60 tablet 3  . hydrochlorothiazide (HYDRODIURIL) 25 MG tablet Take by mouth.    . hydroxychloroquine (PLAQUENIL) 200 MG tablet TAKE 1 TABLET BY MOUTH TWICE A DAY    . Multiple Vitamin (MULTIVITAMIN WITH MINERALS) TABS tablet Take 1 tablet by mouth daily. 180 tablet 0  . pantoprazole (PROTONIX) 40 MG tablet Take 1 tablet (40 mg total) by mouth 2 (two) times daily before a meal. 60 tablet 1  . polyethylene glycol (MIRALAX / GLYCOLAX) packet Take 17 g by mouth  daily.    . predniSONE (DELTASONE) 5 MG tablet Take 5 mg by mouth.    . Pseudoephedrine-APAP 30-325 MG TABS Take by mouth.    . ranitidine (ZANTAC) 150 MG tablet Take by mouth.    . simvastatin (ZOCOR) 40 MG tablet Take by mouth.    . traMADol (ULTRAM) 50 MG tablet Take by mouth.    Marland Kitchen allopurinol (ZYLOPRIM) 100 MG tablet Take 0.5 tablets (50 mg total) by mouth daily. 15 tablet 0   No current facility-administered medications for this visit.     Review of Systems  Constitutional: Positive for diaphoresis (drenching). Negative for fever, malaise/fatigue and weight loss (stable from last visit).       Feels alright.  Baseline weight 120 pounds.  HENT: Negative.   Eyes: Negative.   Respiratory: Negative for cough, hemoptysis,  sputum production and shortness of breath.   Cardiovascular: Negative for chest pain, palpitations, orthopnea, leg swelling and PND.  Gastrointestinal: Negative for abdominal pain, blood in stool, constipation, diarrhea, melena, nausea and vomiting.  Genitourinary: Negative for dysuria, frequency, hematuria and urgency.  Musculoskeletal: Negative for back pain, falls, joint pain and myalgias.  Skin: Negative for itching and rash.  Neurological: Negative for dizziness, tremors, weakness and headaches.  Endo/Heme/Allergies: Does not bruise/bleed easily.  Psychiatric/Behavioral: Negative for depression, memory loss and suicidal ideas. The patient is not nervous/anxious and does not have insomnia.   All other systems reviewed and are negative.  Performance status (ECOG): 1 - Symptomatic but completely ambulatory  Vital Signs: BP 116/69 (Patient Position: Sitting)   Pulse 83   Temp 99.6 F (37.6 C) (Tympanic)   Wt 102 lb 11.2 oz (46.6 kg)   BMI 20.74 kg/m   Physical Exam  Constitutional: She is oriented to person, place, and time and well-developed, well-nourished, and in no distress.  HENT:  Head: Normocephalic and atraumatic.  Mouth/Throat: She has dentures.  Dark curly hair  Eyes: Pupils are equal, round, and reactive to light. EOM are normal. No scleral icterus.  Glasses. Brown eyes.   Neck: Normal range of motion. Neck supple. No tracheal deviation present. No thyromegaly present.  Cardiovascular: Normal rate, regular rhythm and normal heart sounds. Exam reveals no gallop and no friction rub.  No murmur heard. Pulmonary/Chest: Effort normal and breath sounds normal. No respiratory distress. She has no wheezes. She has no rales.  Abdominal: Soft. Bowel sounds are normal. She exhibits no distension. There is no tenderness.  Musculoskeletal: Normal range of motion. She exhibits no edema or tenderness.  Lymphadenopathy:    She has no cervical adenopathy.    She has no axillary  adenopathy.       Right: No inguinal and no supraclavicular adenopathy present.       Left: No inguinal and no supraclavicular adenopathy present.  Neurological: She is alert and oriented to person, place, and time.  Skin: Skin is warm and dry. No rash noted. No erythema.  Psychiatric: Mood, affect and judgment normal.  Nursing note and vitals reviewed.   No visits with results within 3 Day(s) from this visit.  Latest known visit with results is:  Hospital Outpatient Visit on 04/12/2018  Component Date Value Ref Range Status  . aPTT 04/12/2018 26  24 - 36 seconds Final   Performed at [facility], [address] [place], [place] [address]  . Prothrombin Time 04/12/2018 11.4  11.4 - 15.2 seconds Final  . INR 04/12/2018 0.84   Final   Performed at [facility]  Lab, [address] [place], [place] [address]  . WBC 04/12/2018 5.2  3.6 - 11.0 K/uL Final  . RBC 04/12/2018 3.75* 3.80 - 5.20 MIL/uL Final  . Hemoglobin 04/12/2018 11.5* 12.0 - 16.0 g/dL Final  . HCT 04/12/2018 34.2* 35.0 - 47.0 % Final  . MCV 04/12/2018 91.1  80.0 - 100.0 fL Final  . MCH 04/12/2018 30.7  26.0 - 34.0 pg Final  . MCHC 04/12/2018 33.7  32.0 - 36.0 g/dL Final  . RDW 04/12/2018 14.2  11.5 - 14.5 % Final  . Platelets 04/12/2018 222  150 - 440 K/uL Final  . Neutrophils Relative % 04/12/2018 57  % Final  . Lymphocytes Relative 04/12/2018 22  % Final  . Monocytes Relative 04/12/2018 15  % Final  . Eosinophils Relative 04/12/2018 5  % Final  . Basophils Relative 04/12/2018 0  % Final  . Band Neutrophils 04/12/2018 0  % Final  . Metamyelocytes Relative 04/12/2018 0  % Final  . Myelocytes 04/12/2018 1  % Final  . Promyelocytes Relative 04/12/2018 0  % Final  . Blasts 04/12/2018 0  % Final  . nRBC 04/12/2018 0  0 /100 WBC Final  . Other 04/12/2018 0  % Final  . Neutro Abs 04/12/2018 3.0  1.4 - 6.5 K/uL Final  . Lymphs Abs 04/12/2018 1.1  1.0 - 3.6 K/uL Final  . Monocytes Absolute 04/12/2018 0.8   0.2 - 0.9 K/uL Final  . Eosinophils Absolute 04/12/2018 0.3  0 - 0.7 K/uL Final  . Basophils Absolute 04/12/2018 0.0  0 - 0.1 K/uL Final  . RBC Morphology 04/12/2018 POLYCHROMASIA PRESENT   Final   GIANT PLATELETS SEEN  . WBC Morphology 04/12/2018 HYPERSEGMENTED NEUT   Final   Performed at [facility], [address] [place], [place] [address]  . Path Review 04/12/2018 Blood smear reviewed. Platelets are adequate and RBCs are unremarkable. Neutrophil nuclear hypersegmentation is present. No abnormal or immature cells are identified. I note recent B12 and folate levels are high.   Final   Comment: Reviewed by Lemmie Evens. Dicie Beam, MD. Performed at [facility], [address] [place], [place] [address]     Assessment:  [name] is a 73 y.o. 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  IPI is 3 (intermediate-high risk), age-adjusted IPI is 2 (low-intermediate risk), revised IPI is 3 (poor), and NCCN-IPI is 5 (intermediate-high risk).  CNS risk is 3 (intermediate: age, LDH, stage).  EGD on 03/15/2018 revealed pathology from the duodenal scalloped lesion and duodenal sweep ulcer with involvement with diffuse large B cell lymphoma.  Random gastric biopsy was negative for lymphoma.  There was mild chronic gastritis with reactive foveolar hyperplasia.  Gastric fundus scar revealed involvement with diffuse large B cell lymphoma.  GE junction biopsy revealed no lymphoma with squamocolumnar mucosa with mild chronic active inflammation.  The lymphoma has a non-germinal center immunophenotype with a Ki-67 proliferation index of 60% and shows Bcl-2/CMYC co-expression by  immunohistochemical analysis.   Summary of IHC results, neoplastic cells: Positive: PAX-5, CD20(variable), Bcl-2, MUM1, Bcl-6 (variable), CMYC.  Negative: CD10, CD5, CD30, CD138, Cyclin-D1, ALK-1, CD3.  EBV (ISH): Negative.  FISH for MYC gene rearrangement (8q24 gene  region) was negative.  PET scan on 04/06/2018 revealed enlarged and hypermetabolic supraclavicular, mediastinal, abdominal and pelvic adenopathy. Additional smaller hypermetabolic lymph nodes identified within the left cervical lymph node chain. Largest lymph node was a 5.7 cm subcarinal lymph  node (SUV 26.78).  Bone marrow aspirate and biopsy on 04/12/2018 revealed a slightly hypercellular bone marrow for age with trilineage hematopoiesis.  There were dyspoietic changes associated with the granulocyte series.  Flow cytometry revealed a predominance of T lymphocytes with relative abundance of CD8 + cells.  There was no significant B cell population.  Cytogenetics are pending.  Echo on 04/04/2018 revealed an EF of 55-60%.  She presented with a normocytic anemia.  She was admitted x 2 (12/2017 and 01/2018) with rectal bleeding.  She is intolerant of oral iron.  Diet is modest.  EGD on 12/30/2017 revealed non bleeding gastric erosions in the antrum.  Colonoscopy on 12/30/2017 revealed numerous polyps (one 5 mm in ascending colon, two 7 mm polyps in transverse colon, five 5-7 mm polyps in the descending colon, one 7 mm polyp in the sigmoid colon, one 8 mm polyp in the recto-sigmoid colon, and one 19 mm polyp in the rectum). 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    She developed hematochezia post colonoscopy.  Flexible sigmoidoscopy on 12/31/2017 revealed a rectal ulcer at the site of the polypectomy and blood in the rectum.  Clips were placed.  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  Work-up on 04/03/2018 revealed a hematocrit of 32.5, hemoglobin 10.7, MCV 91.7, platelets 207,000, WBC 1800 with an ANC of 600.  Uric acid was 8.8.  LDH was 241 (98-192) and beta2-microglobulin  5.8 (0.6-2.4).  Negative studies included:  hepatitis B core antibody total, hepatitis B  surface antigen, hepatitis C antibody, HIV testing.     She has required 4 units of PRBCs (3 units in 12/2017 and 1 unit in 01/2018).  Ferritin has been followed: 30 on 12/29/2017, 93 on 02/13/2018, 57 on 03/07/2018, and 38 on 04/03/2018.  Labs on 12/29/2017 revealed the following:  B12 was 254 (low normal).  She has been on oral B12 since 03/07/2018.  B12 was 1524 on 04/03/2018.  Folate 6.3 (> 5.9) on 12/29/2017 and 55.8 on 04/03/2018.    She has chronic renal insufficiency.  Creatinine was 1.42 (CrCl 25 ml/min) on 04/03/2018.  She has lupus (diagnosed 1982). She is on prednisone 5 mg a day and Plaquenil.   Symptomatically, patient is doing well overall. She denies any acute complaints. Patient has intermittent drenching sweats at night.  Appetite poor; weight stable. Exam is stable.   Plan: 1. Labs today: CBC with diff, BMP, uric acid, G6PD assay, PT. 2. Diffuse large B cell lymphoma:  Discuss results of PET scan.  PET scan personally reviewed and shown to patient.  Discuss result of bone marrow aspirate and biopsy.  Copy provided patient.  No evidence of lymphoma  Concern for dyspoietic granulocytic line (and associated leukopenia) and tolerance of chemotherapy.  Discuss staging:  IVB based on disease above and below diaphragm with extranodal site of involvement (stomach and duodenum).  Discuss IPI score.  Discuss possible lumbar puncture.  Risk of CNS disease felt low.  Discuss chemotherapy with R-CHOP (mini based on age and marrow concerns; advance as tolerated) with Neulasta support.  Mini-RCHOP:  Rituxan 375 mg/m2  Cytoxan 400 mg/m2 (full dose 750 mg/m2)  Adriamycin 25 mg/m2 (full dose 50 mg/m2)  Vincristine 1 mg (full dose 2 mg; 1.4 mg/m2)  Prednisone 40 mg/2 day 1-5.  Schedule patient for chemotherapy class.   Scheduled for venous access device (port) placement on 04/20/2018. 3. Anemia:  Multi-factorial likely secondary to iron deficiency, anemia of chronic  renal disease, and possibly Plaquenil. 4. Leukopenia:  WBC 1800 on 04/03/2018 and 5200 on 04/12/2018.  Bone marrow reveals dysplastic changes in granulocytic series.  Etiology may be secondary to Plaquenil as has been associated with leukopenia.  Discuss with Dr. Jefm Bryant.  Follow-up cytogenetics. 5. B12 deficiency:  Patient has been on oral B12 1,000 mcg since 03/07/2018  B12 level was normal on 04/03/2018. 6. Hyperuricemia:  Elevated uric acid.  Patient with renal insufficiency.  Rx sent for Allopurinol 50 mg a day (based on renal function).  Increase dose as tolerated to 150 mg/day.  Check G6PD assay if rasburicase needed. 7. Renal insufficiency:  Will recheck today.   Discuss renal insufficiency and planned treatment with patient's nephrologist. 8. RTC on 04/24/2018 for MD assessment, labs (CBC with diff, CMP, LDH, uric acid), and cycle #1 mini R-CHOP with Neulasta support.  A total of (40+) minutes of face-to-face time was spent with the patient with greater than 50% of that time in counseling and care-coordination.    Honor Loh, NP  04/17/2018, 2:32 PM   I saw and evaluated the patient, participating in the key portions of the service and reviewing pertinent diagnostic studies and records.  I reviewed the nurse practitioner's note and agree with the findings and the plan.  The assessment and plan were discussed with the patient.  Extensive questions were asked by the patient and answered.   Nolon Stalls, MD 04/17/2018,2:32 PM

## 2018-04-17 NOTE — Progress Notes (Signed)
[facility] day:  04/17/2018  Chief Complaint: [name] is a 72 y.o. female with diffuse large B cell lymphoma who is seen for review of work-up and discussion regarding direction of therapy.  HPI: The patient was last seen in the medical oncology clinic on 04/03/2018.  At that time, she described drenching sweats.  She was not gaining weight.  Exam revealed no adenopathy or hepatosplenomegaly.  Recent gastric and duodenal biopsies revealed diffuse large B cell lymphoma.  She underwent further work-up.  Echo on 04/04/2018 revealed an EF of 55-60%.  There was grade I diastolic dysfunction.  There was asymmetric thickening of the non-coronary leaflet of the aortic valve with faint echos adjacent to the inflow site of the leaflet.  Most likely represented artifact from adjacent calcifications, though a small vegetation could not be excluded.  She was seen by Dr. Rockey Situ on 04/06/2018.  Echo reviewed.  There was no mobile vegetation.  Blood cultures were drawn.  Clinically appeared well with no features concerning for endocarditis.  If cultures negative, no strong indication for TEE.  Echo from 08/2017 was reviewed with no change in imaging noted.  PET scan on 04/06/2018 revealed enlarged and hypermetabolic supraclavicular, mediastinal, abdominal and pelvic adenopathy compatible with the clinical history of lymphoma. Additional smaller hypermetabolic lymph nodes identified within the left cervical lymph node chain.  There was aortic atherosclerosis and emphysema.  There was extensive calcified atherosclerotic disease status post aortic stent graft repair and femoral to femoral bypass grafting.  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  There was dyspoietic changes associated with the granulocyte  series.  Flow cytometry revealed a predominance of t lymphocytes with relative abundance of CD8 + cells.  There was no significant B cell population.    Past Medical History:  Diagnosis Date  . Cancer [facility])    Stomach cancer  . Chronic kidney disease   . GERD (gastroesophageal reflux disease)   . HTN (hypertension)   . Hyperlipidemia   . Lupus ([place])   . S/P angioplasty with stent     Past Surgical History:  Procedure Laterality Date  . CARDIAC SURGERY    . CESAREAN SECTION    . COLONOSCOPY N/A 12/30/2017   Procedure: COLONOSCOPY;  Surgeon: Lin Landsman, MD;  Location: [facility] ENDOSCOPY;  Service: Gastroenterology;  Laterality: N/A;  . CORONARY ARTERY BYPASS GRAFT  2003  . ESOPHAGOGASTRODUODENOSCOPY N/A 12/30/2017   Procedure: ESOPHAGOGASTRODUODENOSCOPY (EGD);  Surgeon: Lin Landsman, MD;  Location: [facility] ENDOSCOPY;  Service: Gastroenterology;  Laterality: N/A;  . ESOPHAGOGASTRODUODENOSCOPY Left 02/05/2018   Procedure: ESOPHAGOGASTRODUODENOSCOPY (EGD);  Surgeon: Virgel Manifold, MD;  Location: [facility] ENDOSCOPY;  Service: Endoscopy;  Laterality: Left;  . ESOPHAGOGASTRODUODENOSCOPY (EGD) WITH PROPOFOL N/A 03/15/2018   Procedure: ESOPHAGOGASTRODUODENOSCOPY (EGD) WITH PROPOFOL;  Surgeon: Virgel Manifold, MD;  Location: [facility] ENDOSCOPY;  Service: Endoscopy;  Laterality: N/A;  . FLEXIBLE SIGMOIDOSCOPY N/A 12/31/2017   Procedure: FLEXIBLE SIGMOIDOSCOPY;  Surgeon: Jonathon Bellows, MD;  Location: [facility] ENDOSCOPY;  Service: Gastroenterology;  Laterality: N/A;    Family History  Problem Relation Age of Onset  . Hypertension Mother   . Stroke Mother   . Vascular Disease Mother   . Hypertension Sister     Social History:  reports that she quit smoking about 16 years ago. Her smoking use included cigarettes.  She has a 10.00 pack-year smoking history. She has never used smokeless tobacco. She reports that she drank alcohol. She reports that she does not use drugs.  She has smoked 1/2  pack/day x 20 years.  She stopped smoking in 2000.  She denies any exposure to radiation or toxins.  She is from [place].  She moved to the [place] 52 years ago after marrying her husband.  She was in the [employer].  The patient is accompanied by her husband and daughter today.  Allergies:  Allergies  Allergen Reactions  . Penicillins Other (See Comments)    Pass out  . Sulfa Antibiotics Nausea Only  . Sulfasalazine Other (See Comments)    Other reaction(s): NOT KNOWN   . Ace Inhibitors Nausea Only  . Losartan Anxiety    Other reaction(s): OTHER Other reaction(s): OTHER   . Spironolactone Other (See Comments)    Felt poorly but couldn't clarify Felt poorly but couldn't clarify     Current Medications: Current Outpatient Medications  Medication Sig Dispense Refill  . alendronate (FOSAMAX) 70 MG tablet Take 70 mg by mouth once a week. Saturdays    . amLODipine (NORVASC) 5 MG tablet Take by mouth.    . clotrimazole-betamethasone (LOTRISONE) cream APPLY TOPICALLY TWICE A DAY AS NEEDED    . ezetimibe-simvastatin (VYTORIN) 10-40 MG per tablet Take 1 tablet by mouth daily.    . feeding supplement, ENSURE ENLIVE, (ENSURE ENLIVE) LIQD Take 237 mLs by mouth 2 (two) times daily between meals. 90 Bottle 0  . ferrous sulfate 325 (65 FE) MG tablet Take 1 tablet (325 mg total) by mouth 2 (two) times daily with a meal. 60 tablet 3  . hydrochlorothiazide (HYDRODIURIL) 25 MG tablet Take by mouth.    . hydroxychloroquine (PLAQUENIL) 200 MG tablet TAKE 1 TABLET BY MOUTH TWICE A DAY    . Multiple Vitamin (MULTIVITAMIN WITH MINERALS) TABS tablet Take 1 tablet by mouth daily. 180 tablet 0  . pantoprazole (PROTONIX) 40 MG tablet Take 1 tablet (40 mg total) by mouth 2 (two) times daily before a meal. 60 tablet 1  . polyethylene glycol (MIRALAX / GLYCOLAX) packet Take 17 g by mouth daily.    . predniSONE (DELTASONE) 5 MG tablet Take 5 mg by mouth.    . Pseudoephedrine-APAP 30-325 MG TABS Take by mouth.     . ranitidine (ZANTAC) 150 MG tablet Take by mouth.    . simvastatin (ZOCOR) 40 MG tablet Take by mouth.    . traMADol (ULTRAM) 50 MG tablet Take by mouth.     No current facility-administered medications for this visit.     Review of Systems:  GENERAL:  Doing "well".  No fevers.  Drenching sweats.  Not gaining weight.  Baseline weight 120 pounds. PERFORMANCE STATUS (ECOG):  1 HEENT:  Sinus drainage.  No visual changes, runny nose, sore throat, mouth sores or tenderness. Lungs: No shortness of breath.  Cough secondary to drainage.  No hemoptysis. Cardiac:  No chest pain, palpitations, orthopnea, or PND. GI:  No nausea, vomiting, diarrhea, constipation, melena or hematochezia. GU:  No urgency, frequency, dysuria, or hematuria. Musculoskeletal:  No back pain.  No joint pain.  No muscle tenderness. Extremities:  No pain or swelling. Skin:  No rashes or skin changes. Neuro:  No headache, numbness or weakness, balance or coordination issues. Endocrine:  No diabetes, thyroid issues, hot flashes or night sweats. Psych:  No mood changes, depression or anxiety. Pain:  No focal pain. Review of systems:  All other systems reviewed and found to be negative.   Physical Exam: There were no vitals taken for this visit. GENERAL:  Thin woman sitting comfortably in the exam room in no acute distress. MENTAL STATUS:  Alert and oriented to person, place and time. HEAD:  Short dark curly hair.  Normocephalic, atraumatic, face symmetric, no Cushingoid features. EYES:  Glasses.  Brown eyes.  Pupils equal round and reactive to light and accomodation.  No conjunctivitis or scleral icterus. ENT:  Oropharynx clear without lesion.  Tongue normal.  Dentures.  Mucous membranes moist.  RESPIRATORY:  Clear to auscultation without rales, wheezes or rhonchi. CARDIOVASCULAR:  Regular rate and rhythm without murmur, rub or gallop. ABDOMEN:  Soft, non-tender, with active bowel sounds, and no hepatosplenomegaly.  No  masses. SKIN:  No rashes, ulcers or lesions. EXTREMITIES: No edema, no skin discoloration or tenderness.  No palpable cords. LYMPH NODES: No palpable cervical, supraclavicular, axillary or inguinal adenopathy  NEUROLOGICAL: Unremarkable. PSYCH:  Appropriate.    No visits with results within 3 Day(s) from this visit.  Latest known visit with results is:  Hospital Outpatient Visit on 04/12/2018  Component Date Value Ref Range Status  . aPTT 04/12/2018 26  24 - 36 seconds Final   Performed at [facility], [address] [place], [place] [address]  . Prothrombin Time 04/12/2018 11.4  11.4 - 15.2 seconds Final  . INR 04/12/2018 0.84   Final   Performed at [facility], [address] [place], [place] [address]  . WBC 04/12/2018 5.2  3.6 - 11.0 K/uL Final  . RBC 04/12/2018 3.75* 3.80 - 5.20 MIL/uL Final  . Hemoglobin 04/12/2018 11.5* 12.0 - 16.0 g/dL Final  . HCT 04/12/2018 34.2* 35.0 - 47.0 % Final  . MCV 04/12/2018 91.1  80.0 - 100.0 fL Final  . MCH 04/12/2018 30.7  26.0 - 34.0 pg Final  . MCHC 04/12/2018 33.7  32.0 - 36.0 g/dL Final  . RDW 04/12/2018 14.2  11.5 - 14.5 % Final  . Platelets 04/12/2018 222  150 - 440 K/uL Final  . Neutrophils Relative % 04/12/2018 57  % Final  . Lymphocytes Relative 04/12/2018 22  % Final  . Monocytes Relative 04/12/2018 15  % Final  . Eosinophils Relative 04/12/2018 5  % Final  . Basophils Relative 04/12/2018 0  % Final  . Band Neutrophils 04/12/2018 0  % Final  . Metamyelocytes Relative 04/12/2018 0  % Final  . Myelocytes 04/12/2018 1  % Final  . Promyelocytes Relative 04/12/2018 0  % Final  . Blasts 04/12/2018 0  % Final  . nRBC 04/12/2018 0  0 /100 WBC Final  . Other 04/12/2018 0  % Final  . Neutro Abs 04/12/2018 3.0  1.4 - 6.5 K/uL Final  . Lymphs Abs 04/12/2018 1.1  1.0 - 3.6 K/uL Final  . Monocytes Absolute 04/12/2018 0.8  0.2 - 0.9 K/uL Final  . Eosinophils Absolute 04/12/2018 0.3  0 - 0.7 K/uL Final  . Basophils  Absolute 04/12/2018 0.0  0 - 0.1 K/uL Final  . RBC Morphology 04/12/2018 POLYCHROMASIA PRESENT   Final   GIANT PLATELETS SEEN  . WBC Morphology 04/12/2018 HYPERSEGMENTED NEUT   Final   Performed at [facility], [address] [place],  [address]  . Path Review 04/12/2018 Blood smear reviewed. Platelets are adequate and RBCs are unremarkable. Neutrophil nuclear hypersegmentation is present. No abnormal or immature cells are identified. I note recent B12 and folate levels are high.  Final   Comment: Reviewed by Lemmie Evens. Dicie Beam, MD. Performed at [facility], [address] [place], [place] [address]     Assessment:  [name] is a 72 y.o. 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    EGD on 03/15/2018 revealed pathology from the duodenal scalloped lesion and duodenal sweep ulcer with involvement with diffuse large B cell lymphoma.  Random gastric biopsy was negative for lymphoma.  There was mild chronic gastritis with reactive foveolar hyperplasia.  Gastric fundus scar revealed involvement with diffuse large B cell lymphoma.  GE junction biopsy revealed no lymphoma with squamocolumnar mucosa with mild chronic active inflammation.  The lymphoma has a non-germinal center immunophenotype with a Ki-67 proliferation index of 60% and shows Bcl-2/CMYC co-expression by  immunohistochemical analysis.   Summary of IHC results, neoplastic cells: Positive: PAX-5, CD20(variable), Bcl-2, MUM1, Bcl-6 (variable), CMYC.  Negative: CD10, CD5, CD30, CD138, Cyclin-D1, ALK-1, CD3.  EBV (ISH): Negative.  FISH for MYC gene rearrangement (8q24 gene region) was negative.  She presented with a normocytic anemia.  She was admitted x 2 (12/2017 and 01/2018) with rectal bleeding.  She is intolerant of oral iron.  Diet is modest.  EGD on 12/30/2017 revealed non bleeding gastric erosions in the antrum.  Colonoscopy on 12/30/2017 revealed numerous polyps  (one 5 mm in ascending colon, two 7 mm polyps in transverse colon, five 5-7 mm polyps in the descending colon, one 7 mm polyp in the sigmoid colon, one 8 mm polyp in the recto-sigmoid colon, and one 19 mm polyp in the rectum). 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    She developed hematochezia post colonoscopy.  Flexible sigmoidoscopy on 12/31/2017 revealed a rectal ulcer at the site of the polypectomy and blood in the rectum.  Clips were placed.  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  She has required 4 units of PRBCs (3 units in 12/2017 and 1 unit in 01/2018).  Ferritin has been followed: 30 on 12/29/2017, 93 on 02/13/2018, and 57 on 03/07/2018.  Labs on 12/29/2017 revealed the following:  B12 was 254 (low normal).  Folate 6.3 (> 5.9).    She has lupus (diagnosed 1982). She is on prednisone 5 mg a day and Plaquenil.  She has chronic renal insufficiency (Cr 1.51; CrCl 23.6 ml/min).  Symptomatically, she has had drenching sweats.  She is not gaining weight.  Exam reveals no adenopathy or hepatosplenomegaly.  Plan: 1. Labs today:  CBC with diff, BMP, LDH, beta2-microglobulin, uric acid, ferritin, B12, folate, HBV sAg, HBV cAb, HCV Ab, HIV, H.pylori 2. Diffuse large B cell lymphoma:  Discuss results of recent EGD and involvement of stomach and duodenum with diffuse large B cell lymphoma.  Discuss staging (bone marrow and PET scan).  Discuss additional testing   Blood: LDH, beta2-microglobulin, uric acid, hepatitis B surface antigen, hepatitis B core antibody, hepatitis C antibody, HIV testing.  Stool: H pylori antigen (? transformation from MALT lymphoma)  Echo:  Assess EF in anticipation of an anthracycline.  Discuss chemotherapy with R-CHOP (standard or mini based on age). Schedule patient for chemotherapy class.   Discuss central venous  device (port) placement. 3. Anemia:  Multi-factorial likely secondary to iron deficiency, B12 deficiency, anemia of chronic renal disease. 4. B12 deficiency:  Patient has been on oral B12 1,000 mcg since 03/07/2018  Will recheck B12 and folate  levels today. 5. HYPOkalemia:  Discuss low potassium. Potassium level 3.0 today.  Prescription will be sent in for 20 mEq oral potassium supplement (K-Dur) to be taken daily x 5 days.  6. RTC after PET scan and bone marrow for MD assessment.   Lequita Asal, MD  04/17/2018, 2:52 AM   I saw and evaluated the patient, participating in the key portions of the service and reviewing pertinent diagnostic studies and records.  I reviewed the nurse practitioner's note and agree with the findings and the plan.  The assessment and plan were discussed with the patient.  Multiple questions were asked by the patient and answered.   Nolon Stalls, MD 04/17/2018,2:52 AM

## 2018-04-17 NOTE — Progress Notes (Signed)
START ON PATHWAY REGIMEN - Lymphoma and CLL     A cycle is every 21 days:     Prednisone      Rituximab      Cyclophosphamide      Doxorubicin      Vincristine   **Always confirm dose/schedule in your pharmacy ordering system**  Patient Characteristics: Diffuse Large B-Cell Lymphoma, First Line, Stage III and IV Disease Type: Not Applicable Disease Type: Diffuse Large B-Cell Lymphoma Disease Type: Not Applicable Line of therapy: First Line [place] Stage: IV Intent of Therapy: Curative Intent, Discussed with Patient 

## 2018-04-18 ENCOUNTER — Telehealth: Payer: Self-pay | Admitting: *Deleted

## 2018-04-18 ENCOUNTER — Inpatient Hospital Stay: Payer: Medicare Other | Attending: Hematology and Oncology

## 2018-04-18 ENCOUNTER — Other Ambulatory Visit: Payer: Self-pay

## 2018-04-18 ENCOUNTER — Other Ambulatory Visit: Payer: Self-pay | Admitting: Urgent Care

## 2018-04-18 DIAGNOSIS — K219 Gastro-esophageal reflux disease without esophagitis: Secondary | ICD-10-CM | POA: Diagnosis not present

## 2018-04-18 DIAGNOSIS — I129 Hypertensive chronic kidney disease with stage 1 through stage 4 chronic kidney disease, or unspecified chronic kidney disease: Secondary | ICD-10-CM | POA: Insufficient documentation

## 2018-04-18 DIAGNOSIS — D72819 Decreased white blood cell count, unspecified: Secondary | ICD-10-CM | POA: Diagnosis not present

## 2018-04-18 DIAGNOSIS — Z7952 Long term (current) use of systemic steroids: Secondary | ICD-10-CM | POA: Insufficient documentation

## 2018-04-18 DIAGNOSIS — M3214 Glomerular disease in systemic lupus erythematosus: Secondary | ICD-10-CM

## 2018-04-18 DIAGNOSIS — R61 Generalized hyperhidrosis: Secondary | ICD-10-CM | POA: Diagnosis not present

## 2018-04-18 DIAGNOSIS — D509 Iron deficiency anemia, unspecified: Secondary | ICD-10-CM | POA: Diagnosis not present

## 2018-04-18 DIAGNOSIS — E785 Hyperlipidemia, unspecified: Secondary | ICD-10-CM | POA: Insufficient documentation

## 2018-04-18 DIAGNOSIS — Z951 Presence of aortocoronary bypass graft: Secondary | ICD-10-CM | POA: Diagnosis not present

## 2018-04-18 DIAGNOSIS — Z5112 Encounter for antineoplastic immunotherapy: Secondary | ICD-10-CM | POA: Insufficient documentation

## 2018-04-18 DIAGNOSIS — C8339 Diffuse large B-cell lymphoma, extranodal and solid organ sites: Secondary | ICD-10-CM

## 2018-04-18 DIAGNOSIS — N179 Acute kidney failure, unspecified: Secondary | ICD-10-CM | POA: Insufficient documentation

## 2018-04-18 DIAGNOSIS — E538 Deficiency of other specified B group vitamins: Secondary | ICD-10-CM | POA: Diagnosis not present

## 2018-04-18 DIAGNOSIS — D61818 Other pancytopenia: Secondary | ICD-10-CM | POA: Insufficient documentation

## 2018-04-18 DIAGNOSIS — D6959 Other secondary thrombocytopenia: Secondary | ICD-10-CM | POA: Insufficient documentation

## 2018-04-18 DIAGNOSIS — N289 Disorder of kidney and ureter, unspecified: Secondary | ICD-10-CM

## 2018-04-18 DIAGNOSIS — Z87891 Personal history of nicotine dependence: Secondary | ICD-10-CM | POA: Diagnosis not present

## 2018-04-18 DIAGNOSIS — R110 Nausea: Secondary | ICD-10-CM | POA: Diagnosis not present

## 2018-04-18 DIAGNOSIS — Z79899 Other long term (current) drug therapy: Secondary | ICD-10-CM | POA: Insufficient documentation

## 2018-04-18 DIAGNOSIS — K5900 Constipation, unspecified: Secondary | ICD-10-CM | POA: Insufficient documentation

## 2018-04-18 DIAGNOSIS — Z882 Allergy status to sulfonamides status: Secondary | ICD-10-CM | POA: Insufficient documentation

## 2018-04-18 DIAGNOSIS — N189 Chronic kidney disease, unspecified: Secondary | ICD-10-CM | POA: Insufficient documentation

## 2018-04-18 DIAGNOSIS — T451X5S Adverse effect of antineoplastic and immunosuppressive drugs, sequela: Secondary | ICD-10-CM | POA: Insufficient documentation

## 2018-04-18 DIAGNOSIS — N184 Chronic kidney disease, stage 4 (severe): Secondary | ICD-10-CM

## 2018-04-18 DIAGNOSIS — Z5111 Encounter for antineoplastic chemotherapy: Secondary | ICD-10-CM | POA: Diagnosis not present

## 2018-04-18 DIAGNOSIS — D500 Iron deficiency anemia secondary to blood loss (chronic): Secondary | ICD-10-CM

## 2018-04-18 LAB — CBC WITH DIFFERENTIAL/PLATELET
BASOS PCT: 1 %
Basophils Absolute: 0.1 10*3/uL (ref 0–0.1)
EOS ABS: 0.3 10*3/uL (ref 0–0.7)
EOS PCT: 6 %
HCT: 32.2 % — ABNORMAL LOW (ref 35.0–47.0)
Hemoglobin: 10.7 g/dL — ABNORMAL LOW (ref 12.0–16.0)
LYMPHS ABS: 0.8 10*3/uL — AB (ref 1.0–3.6)
Lymphocytes Relative: 16 %
MCH: 30.2 pg (ref 26.0–34.0)
MCHC: 33.1 g/dL (ref 32.0–36.0)
MCV: 91.3 fL (ref 80.0–100.0)
MONO ABS: 0.6 10*3/uL (ref 0.2–0.9)
MONOS PCT: 12 %
Neutro Abs: 3.5 10*3/uL (ref 1.4–6.5)
Neutrophils Relative %: 65 %
Platelets: 221 10*3/uL (ref 150–440)
RBC: 3.53 MIL/uL — ABNORMAL LOW (ref 3.80–5.20)
RDW: 14.3 % (ref 11.5–14.5)
WBC: 5.2 10*3/uL (ref 3.6–11.0)

## 2018-04-18 LAB — BASIC METABOLIC PANEL
Anion gap: 12 (ref 5–15)
BUN: 41 mg/dL — AB (ref 8–23)
CALCIUM: 11.7 mg/dL — AB (ref 8.9–10.3)
CHLORIDE: 96 mmol/L — AB (ref 98–111)
CO2: 28 mmol/L (ref 22–32)
CREATININE: 2.3 mg/dL — AB (ref 0.44–1.00)
GFR calc Af Amer: 24 mL/min — ABNORMAL LOW (ref >60)
GFR calc non Af Amer: 20 mL/min — ABNORMAL LOW (ref >60)
GLUCOSE: 95 mg/dL (ref 70–99)
Potassium: 3.8 mmol/L (ref 3.5–5.1)
Sodium: 136 mmol/L (ref 135–145)

## 2018-04-18 LAB — PROTIME-INR
INR: 0.94
PROTHROMBIN TIME: 12.5 s (ref 11.4–15.2)

## 2018-04-18 LAB — URIC ACID: Uric Acid, Serum: 7.8 mg/dL — ABNORMAL HIGH (ref 2.5–7.1)

## 2018-04-18 MED ORDER — CLINDAMYCIN PHOSPHATE 300 MG/50ML IV SOLN: 300.0000 mg | Freq: Once | INTRAVENOUS | Status: DC

## 2018-04-18 NOTE — Telephone Encounter (Signed)
Patient sees Dr. Percell Miller @ [facility] on [address].  Patient's husband came by with this info while patient was getting labs.

## 2018-04-18 NOTE — Telephone Encounter (Signed)
Called and spoke to patient's husband to inform him that MD has ordered STAT US for patient tomorrow.  Patient to arrive @ [facility] @ 1:30 for 2 PM appt.  Also patient has appointment with Nephrologist on Friday, October 4 @ 12:30.  Mr. Gilles verbalized understanding and repeated back to me.

## 2018-04-18 NOTE — Telephone Encounter (Signed)
I have spoken with Dr. Debroah Torres office. She will be a new patient since her previous nephrologist left the practice. They are able to work patient in on 04/21/2018 at 12:30. Please make her aware of the appointment. It is imperative that she attend this appointment so that we can stay on track for her treatment here with Judy Torres.   In the interim, I am scheduling patient for a STAT renal ultrasound. Her creatinine has nearly doubled over the course of the last 2 weeks. With her lymphoma, there is always the concern for mass effect on the kidnies. Message has been sent to scheduling.   Honor Loh, MSN, APRN, FNP-C, CEN Oncology/Hematology Nurse Practitioner  [facility] 04/18/18, 1:02 PM

## 2018-04-19 ENCOUNTER — Encounter
Admission: RE | Disposition: A | Discharge status: Home / Self Care | Payer: Self-pay | Source: Ambulatory Visit | Attending: Vascular Surgery

## 2018-04-19 ENCOUNTER — Ambulatory Visit
Admission: RE | Admit: 2018-04-19 | Discharge: 2018-04-19 | Disposition: A | Discharge status: Home / Self Care | Payer: Medicare Other | Source: Ambulatory Visit | Attending: Vascular Surgery | Admitting: Vascular Surgery

## 2018-04-19 ENCOUNTER — Ambulatory Visit
Admission: RE | Admit: 2018-04-19 | Discharge: 2018-04-19 | Disposition: A | Discharge status: Home / Self Care | Payer: Medicare Other | Source: Ambulatory Visit | Attending: Urgent Care | Admitting: Urgent Care

## 2018-04-19 DIAGNOSIS — I129 Hypertensive chronic kidney disease with stage 1 through stage 4 chronic kidney disease, or unspecified chronic kidney disease: Secondary | ICD-10-CM | POA: Insufficient documentation

## 2018-04-19 DIAGNOSIS — Z955 Presence of coronary angioplasty implant and graft: Secondary | ICD-10-CM | POA: Insufficient documentation

## 2018-04-19 DIAGNOSIS — N281 Cyst of kidney, acquired: Secondary | ICD-10-CM | POA: Insufficient documentation

## 2018-04-19 DIAGNOSIS — Z9889 Other specified postprocedural states: Secondary | ICD-10-CM | POA: Insufficient documentation

## 2018-04-19 DIAGNOSIS — M329 Systemic lupus erythematosus, unspecified: Secondary | ICD-10-CM | POA: Insufficient documentation

## 2018-04-19 DIAGNOSIS — Z888 Allergy status to other drugs, medicaments and biological substances status: Secondary | ICD-10-CM | POA: Diagnosis not present

## 2018-04-19 DIAGNOSIS — K219 Gastro-esophageal reflux disease without esophagitis: Secondary | ICD-10-CM | POA: Diagnosis not present

## 2018-04-19 DIAGNOSIS — Z87891 Personal history of nicotine dependence: Secondary | ICD-10-CM | POA: Diagnosis not present

## 2018-04-19 DIAGNOSIS — C8590 Non-Hodgkin lymphoma, unspecified, unspecified site: Secondary | ICD-10-CM | POA: Diagnosis not present

## 2018-04-19 DIAGNOSIS — N289 Disorder of kidney and ureter, unspecified: Secondary | ICD-10-CM

## 2018-04-19 DIAGNOSIS — Z79899 Other long term (current) drug therapy: Secondary | ICD-10-CM | POA: Insufficient documentation

## 2018-04-19 DIAGNOSIS — Z8249 Family history of ischemic heart disease and other diseases of the circulatory system: Secondary | ICD-10-CM | POA: Diagnosis not present

## 2018-04-19 DIAGNOSIS — N184 Chronic kidney disease, stage 4 (severe): Secondary | ICD-10-CM | POA: Insufficient documentation

## 2018-04-19 DIAGNOSIS — Z833 Family history of diabetes mellitus: Secondary | ICD-10-CM | POA: Diagnosis not present

## 2018-04-19 DIAGNOSIS — Z880 Allergy status to penicillin: Secondary | ICD-10-CM | POA: Diagnosis not present

## 2018-04-19 DIAGNOSIS — E538 Deficiency of other specified B group vitamins: Secondary | ICD-10-CM | POA: Diagnosis not present

## 2018-04-19 DIAGNOSIS — Z882 Allergy status to sulfonamides status: Secondary | ICD-10-CM | POA: Diagnosis not present

## 2018-04-19 DIAGNOSIS — D500 Iron deficiency anemia secondary to blood loss (chronic): Secondary | ICD-10-CM | POA: Insufficient documentation

## 2018-04-19 DIAGNOSIS — D72819 Decreased white blood cell count, unspecified: Secondary | ICD-10-CM | POA: Insufficient documentation

## 2018-04-19 DIAGNOSIS — C8339 Diffuse large B-cell lymphoma, extranodal and solid organ sites: Secondary | ICD-10-CM | POA: Diagnosis not present

## 2018-04-19 DIAGNOSIS — E785 Hyperlipidemia, unspecified: Secondary | ICD-10-CM | POA: Diagnosis not present

## 2018-04-19 DIAGNOSIS — C8300 Small cell B-cell lymphoma, unspecified site: Secondary | ICD-10-CM

## 2018-04-19 DIAGNOSIS — M3214 Glomerular disease in systemic lupus erythematosus: Secondary | ICD-10-CM

## 2018-04-19 HISTORY — PX: PORTA CATH INSERTION: CATH118285

## 2018-04-19 SURGERY — PORTA CATH INSERTION
Anesthesia: Moderate Sedation

## 2018-04-19 MED ORDER — LIDOCAINE-EPINEPHRINE (PF) 1 %-1:200000 IJ SOLN
INTRAMUSCULAR | Status: AC
  Filled 2018-04-19: qty 30

## 2018-04-19 MED ORDER — MIDAZOLAM HCL 2 MG/2ML IJ SOLN
INTRAMUSCULAR | Status: AC
  Filled 2018-04-19: qty 2

## 2018-04-19 MED ORDER — HYDROMORPHONE HCL 1 MG/ML IJ SOLN: 1.0000 mg | Freq: Once | INTRAMUSCULAR | Status: DC | PRN

## 2018-04-19 MED ORDER — FENTANYL CITRATE (PF) 100 MCG/2ML IJ SOLN
INTRAMUSCULAR | Status: DC | PRN
  Administered 2018-04-19: 50 ug via INTRAVENOUS

## 2018-04-19 MED ORDER — SODIUM CHLORIDE 0.9 % IV SOLN
INTRAVENOUS | Status: DC
  Administered 2018-04-19: 10:00:00 via INTRAVENOUS

## 2018-04-19 MED ORDER — FENTANYL CITRATE (PF) 100 MCG/2ML IJ SOLN
INTRAMUSCULAR | Status: AC
  Filled 2018-04-19: qty 2

## 2018-04-19 MED ORDER — ONDANSETRON HCL 4 MG/2ML IJ SOLN: 4.0000 mg | Freq: Four times a day (QID) | INTRAMUSCULAR | Status: DC | PRN

## 2018-04-19 MED ORDER — MIDAZOLAM HCL 2 MG/2ML IJ SOLN
INTRAMUSCULAR | Status: DC | PRN
  Administered 2018-04-19: 2 mg via INTRAVENOUS

## 2018-04-19 MED ORDER — SODIUM CHLORIDE 0.9 % IV SOLN
Freq: Once | INTRAVENOUS | Status: DC
  Filled 2018-04-19: qty 2

## 2018-04-19 MED ORDER — CLINDAMYCIN PHOSPHATE 300 MG/50ML IV SOLN
INTRAVENOUS | Status: AC
  Administered 2018-04-19: 14:00:00
  Filled 2018-04-19: qty 50

## 2018-04-19 SURGICAL SUPPLY — 7 items
DRAPE INCISE IOBAN 66X45 STRL (DRAPES) ×3 IMPLANT
KIT PORT POWER 8FR ISP CVUE (Port) ×2 IMPLANT
PACK ANGIOGRAPHY (CUSTOM PROCEDURE TRAY) ×3 IMPLANT
SUT MNCRL AB 4-0 PS2 18 (SUTURE) ×3 IMPLANT
SUT PROLENE 0 CT 1 30 (SUTURE) ×3 IMPLANT
SUT VIC AB 3-0 SH 27 (SUTURE) ×3
SUT VIC AB 3-0 SH 27X BRD (SUTURE) IMPLANT

## 2018-04-19 NOTE — Patient Instructions (Signed)
Rituximab injection What is this medicine? RITUXIMAB (ri TUX i mab) is a monoclonal antibody. It is used to treat certain types of cancer like non-Hodgkin lymphoma and chronic lymphocytic leukemia. It is also used to treat rheumatoid arthritis, granulomatosis with polyangiitis (or Wegener's granulomatosis), and microscopic polyangiitis. This medicine may be used for other purposes; ask your health care provider or pharmacist if you have questions. COMMON BRAND NAME(S): Rituxan What should I tell my health care provider before I take this medicine? They need to know if you have any of these conditions: -heart disease -infection (especially a virus infection such as hepatitis B, chickenpox, cold sores, or herpes) -immune system problems -irregular heartbeat -kidney disease -lung or breathing disease, like asthma -recently received or scheduled to receive a vaccine -an unusual or allergic reaction to rituximab, mouse proteins, other medicines, foods, dyes, or preservatives -pregnant or trying to get pregnant -breast-feeding How should I use this medicine? This medicine is for infusion into a vein. It is administered in a hospital or clinic by a specially trained health care professional. A special MedGuide will be given to you by the pharmacist with each prescription and refill. Be sure to read this information carefully each time. Talk to your pediatrician regarding the use of this medicine in children. This medicine is not approved for use in children. Overdosage: If you think you have taken too much of this medicine contact a poison control center or emergency room at once. NOTE: This medicine is only for you. Do not share this medicine with others. What if I miss a dose? It is important not to miss a dose. Call your doctor or health care professional if you are unable to keep an appointment. What may interact with this medicine? -cisplatin -other medicines for arthritis like disease  modifying antirheumatic drugs or tumor necrosis factor inhibitors -live virus vaccines This list may not describe all possible interactions. Give your health care provider a list of all the medicines, herbs, non-prescription drugs, or dietary supplements you use. Also tell them if you smoke, drink alcohol, or use illegal drugs. Some items may interact with your medicine. What should I watch for while using this medicine? Your condition will be monitored carefully while you are receiving this medicine. You may need blood work done while you are taking this medicine. This medicine can cause serious allergic reactions. To reduce your risk you may need to take medicine before treatment with this medicine. Take your medicine as directed. In some patients, this medicine may cause a serious brain infection that may cause death. If you have any problems seeing, thinking, speaking, walking, or standing, tell your doctor right away. If you cannot reach your doctor, urgently seek other source of medical care. Call your doctor or health care professional for advice if you get a fever, chills or sore throat, or other symptoms of a cold or flu. Do not treat yourself. This drug decreases your body's ability to fight infections. Try to avoid being around people who are sick. Do not become pregnant while taking this medicine or for 12 months after stopping it. Women should inform their doctor if they wish to become pregnant or think they might be pregnant. There is a potential for serious side effects to an unborn child. Talk to your health care professional or pharmacist for more information. What side effects may I notice from receiving this medicine? Side effects that you should report to your doctor or health care professional as soon as possible: -breathing   problems -chest pain -dizziness or feeling faint -fast, irregular heartbeat -low blood counts - this medicine may decrease the number of white blood cells,  red blood cells and platelets. You may be at increased risk for infections and bleeding. -mouth sores -redness, blistering, peeling or loosening of the skin, including inside the mouth (this can be added for any serious or exfoliative rash that could lead to hospitalization) -signs of infection - fever or chills, cough, sore throat, pain or difficulty passing urine -signs and symptoms of kidney injury like trouble passing urine or change in the amount of urine -signs and symptoms of liver injury like dark yellow or brown urine; general ill feeling or flu-like symptoms; light-colored stools; loss of appetite; nausea; right upper belly pain; unusually weak or tired; yellowing of the eyes or skin -stomach pain -vomiting Side effects that usually do not require medical attention (report to your doctor or health care professional if they continue or are bothersome): -headache -joint pain -muscle cramps or muscle pain This list may not describe all possible side effects. Call your doctor for medical advice about side effects. You may report side effects to FDA at 1-800-FDA-1088. Where should I keep my medicine? This drug is given in a hospital or clinic and will not be stored at home. NOTE: This sheet is a summary. It may not cover all possible information. If you have questions about this medicine, talk to your doctor, pharmacist, or health care provider.  2018 Else[employer]/Gold Standard (2016-02-11 15:28:09) Doxorubicin injection What is this medicine? DOXORUBICIN (dox oh ROO bi sin) is a chemotherapy drug. It is used to treat many kinds of cancer like leukemia, lymphoma, neuroblastoma, sarcoma, and Wilms' tumor. It is also used to treat bladder cancer, breast cancer, lung cancer, ovarian cancer, stomach cancer, and thyroid cancer. This medicine may be used for other purposes; ask your health care provider or pharmacist if you have questions. COMMON BRAND NAME(S): Adriamycin, Adriamycin PFS, Adriamycin  RDF, Rubex What should I tell my health care provider before I take this medicine? They need to know if you have any of these conditions: -heart disease -history of low blood counts caused by a medicine -liver disease -recent or ongoing radiation therapy -an unusual or allergic reaction to doxorubicin, other chemotherapy agents, other medicines, foods, dyes, or preservatives -pregnant or trying to get pregnant -breast-feeding How should I use this medicine? This drug is given as an infusion into a vein. It is administered in a hospital or clinic by a specially trained health care professional. If you have pain, swelling, burning or any unusual feeling around the site of your injection, tell your health care professional right away. Talk to your pediatrician regarding the use of this medicine in children. Special care may be needed. Overdosage: If you think you have taken too much of this medicine contact a poison control center or emergency room at once. NOTE: This medicine is only for you. Do not share this medicine with others. What if I miss a dose? It is important not to miss your dose. Call your doctor or health care professional if you are unable to keep an appointment. What may interact with this medicine? This medicine may interact with the following medications: -6-mercaptopurine -paclitaxel -phenytoin -St. John's Wort -trastuzumab -verapamil This list may not describe all possible interactions. Give your health care provider a list of all the medicines, herbs, non-prescription drugs, or dietary supplements you use. Also tell them if you smoke, drink alcohol, or use illegal drugs.   Some items may interact with your medicine. What should I watch for while using this medicine? This drug may make you feel generally unwell. This is not uncommon, as chemotherapy can affect healthy cells as well as cancer cells. Report any side effects. Continue your course of treatment even though you  feel ill unless your doctor tells you to stop. There is a maximum amount of this medicine you should receive throughout your life. The amount depends on the medical condition being treated and your overall health. Your doctor will watch how much of this medicine you receive in your lifetime. Tell your doctor if you have taken this medicine before. You may need blood work done while you are taking this medicine. Your urine may turn red for a few days after your dose. This is not blood. If your urine is dark or brown, call your doctor. In some cases, you may be given additional medicines to help with side effects. Follow all directions for their use. Call your doctor or health care professional for advice if you get a fever, chills or sore throat, or other symptoms of a cold or flu. Do not treat yourself. This drug decreases your body's ability to fight infections. Try to avoid being around people who are sick. This medicine may increase your risk to bruise or bleed. Call your doctor or health care professional if you notice any unusual bleeding. Talk to your doctor about your risk of cancer. You may be more at risk for certain types of cancers if you take this medicine. Do not become pregnant while taking this medicine or for 6 months after stopping it. Women should inform their doctor if they wish to become pregnant or think they might be pregnant. Men should not father a child while taking this medicine and for 6 months after stopping it. There is a potential for serious side effects to an unborn child. Talk to your health care professional or pharmacist for more information. Do not breast-feed an infant while taking this medicine. This medicine has caused ovarian failure in some women and reduced sperm counts in some men This medicine may interfere with the ability to have a child. Talk with your doctor or health care professional if you are concerned about your fertility. What side effects may I notice  from receiving this medicine? Side effects that you should report to your doctor or health care professional as soon as possible: -allergic reactions like skin rash, itching or hives, swelling of the face, lips, or tongue -breathing problems -chest pain -fast or irregular heartbeat -low blood counts - this medicine may decrease the number of white blood cells, red blood cells and platelets. You may be at increased risk for infections and bleeding. -pain, redness, or irritation at site where injected -signs of infection - fever or chills, cough, sore throat, pain or difficulty passing urine -signs of decreased platelets or bleeding - bruising, pinpoint red spots on the skin, black, tarry stools, blood in the urine -swelling of the ankles, feet, hands -tiredness -weakness Side effects that usually do not require medical attention (report to your doctor or health care professional if they continue or are bothersome): -diarrhea -hair loss -mouth sores -nail discoloration or damage -nausea -red colored urine -vomiting This list may not describe all possible side effects. Call your doctor for medical advice about side effects. You may report side effects to FDA at 1-800-FDA-1088. Where should I keep my medicine? This drug is given in a hospital or clinic   and will not be stored at home. NOTE: This sheet is a summary. It may not cover all possible information. If you have questions about this medicine, talk to your doctor, pharmacist, or health care provider.  2018 Else[employer]/Gold Standard (2015-09-01 11:28:51) Vincristine injection What is this medicine? VINCRISTINE (vin KRIS teen) is a chemotherapy drug. It slows the growth of cancer cells. This medicine is used to treat many types of cancer like Hodgkin's disease, leukemia, non-Hodgkin's lymphoma, neuroblastoma (brain cancer), rhabdomyosarcoma, and Wilms' tumor. This medicine may be used for other purposes; ask your health care provider or  pharmacist if you have questions. COMMON BRAND NAME(S): Oncovin, Vincasar PFS What should I tell my health care provider before I take this medicine? They need to know if you have any of these conditions: -blood disorders -gout -infection (especially chickenpox, cold sores, or herpes) -kidney disease -liver disease -lung disease -nervous system disease like Charcot-Marie-Tooth (CMT) -recent or ongoing radiation therapy -an unusual or allergic reaction to vincristine, other chemotherapy agents, other medicines, foods, dyes, or preservatives -pregnant or trying to get pregnant -breast-feeding How should I use this medicine? This drug is given as an infusion into a vein. It is administered in a hospital or clinic by a specially trained health care professional. If you have pain, swelling, burning, or any unusual feeling around the site of your injection, tell your health care professional right away. Talk to your pediatrician regarding the use of this medicine in children. While this drug may be prescribed for selected conditions, precautions do apply. Overdosage: If you think you have taken too much of this medicine contact a poison control center or emergency room at once. NOTE: This medicine is only for you. Do not share this medicine with others. What if I miss a dose? It is important not to miss your dose. Call your doctor or health care professional if you are unable to keep an appointment. What may interact with this medicine? Do not take this medicine with any of the following medications: -itraconazole -mibefradil -voriconazole This medicine may also interact with the following medications: -cyclosporine -erythromycin -fluconazole -ketoconazole -medicines for HIV like delavirdine, efavirenz, nevirapine -medicines for seizures like ethotoin, fosphenotoin, phenytoin -medicines to increase blood counts like filgrastim, pegfilgrastim, sargramostim -other chemotherapy drugs like  cisplatin, L-asparaginase, methotrexate, mitomycin, paclitaxel -pegaspargase -vaccines -zalcitabine, ddC Talk to your doctor or health care professional before taking any of these medicines: -acetaminophen -aspirin -ibuprofen -ketoprofen -naproxen This list may not describe all possible interactions. Give your health care provider a list of all the medicines, herbs, non-prescription drugs, or dietary supplements you use. Also tell them if you smoke, drink alcohol, or use illegal drugs. Some items may interact with your medicine. What should I watch for while using this medicine? Your condition will be monitored carefully while you are receiving this medicine. You will need important blood work done while you are taking this medicine. This drug may make you feel generally unwell. This is not uncommon, as chemotherapy can affect healthy cells as well as cancer cells. Report any side effects. Continue your course of treatment even though you feel ill unless your doctor tells you to stop. In some cases, you may be given additional medicines to help with side effects. Follow all directions for their use. Call your doctor or health care professional for advice if you get a fever, chills or sore throat, or other symptoms of a cold or flu. Do not treat yourself. Avoid taking products that contain aspirin, acetaminophen, ibuprofen,   naproxen, or ketoprofen unless instructed by your doctor. These medicines may hide a fever. Do not become pregnant while taking this medicine. Women should inform their doctor if they wish to become pregnant or think they might be pregnant. There is a potential for serious side effects to an unborn child. Talk to your health care professional or pharmacist for more information. Do not breast-feed an infant while taking this medicine. Men may have a lower sperm count while taking this medicine. Talk to your doctor if you plan to father a child. What side effects may I notice from  receiving this medicine? Side effects that you should report to your doctor or health care professional as soon as possible: -allergic reactions like skin rash, itching or hives, swelling of the face, lips, or tongue -breathing problems -confusion or changes in emotions or moods -constipation -cough -mouth sores -muscle weakness -nausea and vomiting -pain, swelling, redness or irritation at the injection site -pain, tingling, numbness in the hands or feet -problems with balance, talking, walking -seizures -stomach pain -trouble passing urine or change in the amount of urine Side effects that usually do not require medical attention (report to your doctor or health care professional if they continue or are bothersome): -diarrhea -hair loss -jaw pain -loss of appetite This list may not describe all possible side effects. Call your doctor for medical advice about side effects. You may report side effects to FDA at 1-800-FDA-1088. Where should I keep my medicine? This drug is given in a hospital or clinic and will not be stored at home. NOTE: This sheet is a summary. It may not cover all possible information. If you have questions about this medicine, talk to your doctor, pharmacist, or health care provider.  2018 [employer]/Gold Standard (2008-04-01 17:17:13) Cyclophosphamide injection What is this medicine? CYCLOPHOSPHAMIDE (sye kloe FOSS fa mide) is a chemotherapy drug. It slows the growth of cancer cells. This medicine is used to treat many types of cancer like lymphoma, myeloma, leukemia, breast cancer, and ovarian cancer, to name a few. This medicine may be used for other purposes; ask your health care provider or pharmacist if you have questions. COMMON BRAND NAME(S): Cytoxan, Neosar What should I tell my health care provider before I take this medicine? They need to know if you have any of these conditions: -blood disorders -history of other chemotherapy -infection -kidney  disease -liver disease -recent or ongoing radiation therapy -tumors in the bone marrow -an unusual or allergic reaction to cyclophosphamide, other chemotherapy, other medicines, foods, dyes, or preservatives -pregnant or trying to get pregnant -breast-feeding How should I use this medicine? This drug is usually given as an injection into a vein or muscle or by infusion into a vein. It is administered in a hospital or clinic by a specially trained health care professional. Talk to your pediatrician regarding the use of this medicine in children. Special care may be needed. Overdosage: If you think you have taken too much of this medicine contact a poison control center or emergency room at once. NOTE: This medicine is only for you. Do not share this medicine with others. What if I miss a dose? It is important not to miss your dose. Call your doctor or health care professional if you are unable to keep an appointment. What may interact with this medicine? This medicine may interact with the following medications: -amiodarone -amphotericin B -azathioprine -certain antiviral medicines for HIV or AIDS such as protease inhibitors (e.g., indinavir, ritonavir) and zidovudine -certain blood   pressure medications such as benazepril, captopril, enalapril, fosinopril, lisinopril, moexipril, monopril, perindopril, quinapril, ramipril, trandolapril -certain cancer medications such as anthracyclines (e.g., daunorubicin, doxorubicin), busulfan, cytarabine, paclitaxel, pentostatin, tamoxifen, trastuzumab -certain diuretics such as chlorothiazide, chlorthalidone, hydrochlorothiazide, indapamide, metolazone -certain medicines that treat or prevent blood clots like warfarin -certain muscle relaxants such as succinylcholine -cyclosporine -etanercept -indomethacin -medicines to increase blood counts like filgrastim, pegfilgrastim, sargramostim -medicines used as general  anesthesia -metronidazole -natalizumab This list may not describe all possible interactions. Give your health care provider a list of all the medicines, herbs, non-prescription drugs, or dietary supplements you use. Also tell them if you smoke, drink alcohol, or use illegal drugs. Some items may interact with your medicine. What should I watch for while using this medicine? Visit your doctor for checks on your progress. This drug may make you feel generally unwell. This is not uncommon, as chemotherapy can affect healthy cells as well as cancer cells. Report any side effects. Continue your course of treatment even though you feel ill unless your doctor tells you to stop. Drink water or other fluids as directed. Urinate often, even at night. In some cases, you may be given additional medicines to help with side effects. Follow all directions for their use. Call your doctor or health care professional for advice if you get a fever, chills or sore throat, or other symptoms of a cold or flu. Do not treat yourself. This drug decreases your body's ability to fight infections. Try to avoid being around people who are sick. This medicine may increase your risk to bruise or bleed. Call your doctor or health care professional if you notice any unusual bleeding. Be careful brushing and flossing your teeth or using a toothpick because you may get an infection or bleed more easily. If you have any dental work done, tell your dentist you are receiving this medicine. You may get drowsy or dizzy. Do not drive, use machinery, or do anything that needs mental alertness until you know how this medicine affects you. Do not become pregnant while taking this medicine or for 1 year after stopping it. Women should inform their doctor if they wish to become pregnant or think they might be pregnant. Men should not father a child while taking this medicine and for 4 months after stopping it. There is a potential for serious side  effects to an unborn child. Talk to your health care professional or pharmacist for more information. Do not breast-feed an infant while taking this medicine. This medicine may interfere with the ability to have a child. This medicine has caused ovarian failure in some women. This medicine has caused reduced sperm counts in some men. You should talk with your doctor or health care professional if you are concerned about your fertility. If you are going to have surgery, tell your doctor or health care professional that you have taken this medicine. What side effects may I notice from receiving this medicine? Side effects that you should report to your doctor or health care professional as soon as possible: -allergic reactions like skin rash, itching or hives, swelling of the face, lips, or tongue -low blood counts - this medicine may decrease the number of white blood cells, red blood cells and platelets. You may be at increased risk for infections and bleeding. -signs of infection - fever or chills, cough, sore throat, pain or difficulty passing urine -signs of decreased platelets or bleeding - bruising, pinpoint red spots on the skin, black, tarry stools, blood   in the urine -signs of decreased red blood cells - unusually weak or tired, fainting spells, lightheadedness -breathing problems -dark urine -dizziness -palpitations -swelling of the ankles, feet, hands -trouble passing urine or change in the amount of urine -weight gain -yellowing of the eyes or skin Side effects that usually do not require medical attention (report to your doctor or health care professional if they continue or are bothersome): -changes in nail or skin color -hair loss -missed menstrual periods -mouth sores -nausea, vomiting This list may not describe all possible side effects. Call your doctor for medical advice about side effects. You may report side effects to FDA at 1-800-FDA-1088. Where should I keep my  medicine? This drug is given in a hospital or clinic and will not be stored at home. NOTE: This sheet is a summary. It may not cover all possible information. If you have questions about this medicine, talk to your doctor, pharmacist, or health care provider.  2018 [employer]/Gold Standard (2012-05-19 16:22:58)  

## 2018-04-19 NOTE — Op Note (Signed)
      [facility]       Operative Note  Date: 04/19/2018  Preoperative diagnosis:  1. lymphoma  Postoperative diagnosis:  Same as above  Procedures: #1. Ultrasound guidance for vascular access to the right internal jugular vein. #2. Fluoroscopic guidance for placement of catheter. #3. Placement of CT compatible Port-A-Cath, right internal jugular vein.  Surgeon: Leotis Pain, MD.   Anesthesia: Local with moderate conscious sedation for approximately 20  minutes using 2 mg of Versed and 50 mcg of Fentanyl  Fluoroscopy time: less than 1 minute  Contrast used: 0  Estimated blood loss: 5 cc  Indication for the procedure:  The patient is a 72 y.o.female with lymphoma.  The patient needs a Port-A-Cath for durable venous access, chemotherapy, lab draws, and CT scans. We are asked to place this. Risks and benefits were discussed and informed consent was obtained.  Description of procedure: The patient was brought to the vascular and interventional radiology suite.  Moderate conscious sedation was administered throughout the procedure during a face to face encounter with the patient with my supervision of the RN administering medicines and monitoring the patient's vital signs, pulse oximetry, telemetry and mental status throughout from the start of the procedure until the patient was taken to the recovery room. The right neck chest and shoulder were sterilely prepped and draped, and a sterile surgical field was created. Ultrasound was used to help visualize a patent right internal jugular vein. This was then accessed under direct ultrasound guidance without difficulty with the Seldinger needle and a permanent image was recorded. A J-wire was placed. After skin nick and dilatation, the peel-away sheath was then placed over the wire. I then anesthetized an area under the clavicle approximately 1-2 fingerbreadths. A transverse incision was created and an inferior pocket was created  with electrocautery and blunt dissection. The port was then brought onto the field, placed into the pocket and secured to the chest wall with 2 Prolene sutures. The catheter was connected to the port and tunneled from the subclavicular incision to the access site. Fluoroscopic guidance was then used to cut the catheter to an appropriate length. The catheter was then placed through the peel-away sheath and the peel-away sheath was removed. The catheter tip was parked in excellent location under fluorocoscopic guidance in the SVC just above the right atrium. The pocket was then irrigated with antibiotic impregnated saline and the wound was closed with a running 3-0 Vicryl and a 4-0 Monocryl. The access incision was closed with a single 4-0 Monocryl. The Huber needle was used to withdraw blood and flush the port with heparinized saline. Dermabond was then placed as a dressing. The patient tolerated the procedure well and was taken to the recovery room in stable condition.   Leotis Pain 04/19/2018 2:20 PM   This note was created with Dragon Medical transcription system. Any errors in dictation are purely unintentional.

## 2018-04-19 NOTE — Progress Notes (Signed)
Patient to ultrasound. Will return to assigned room once completed

## 2018-04-19 NOTE — H&P (Signed)
[facility] History & Physical Update  The patient was interviewed and re-examined.  The patient's previous History and Physical has been reviewed and is unchanged.  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  There is no change in the plan of care. We plan to proceed with the scheduled procedure.  Hortencia Pilar, MD  04/19/2018, 11:44 AM

## 2018-04-19 NOTE — H&P (Signed)
[facility] History & Physical Update  The patient was interviewed and re-examined.  The patient's previous History and Physical has been reviewed and is unchanged.  There is no change in the plan of care. We plan to proceed with the scheduled procedure.  Leotis Pain, MD  04/19/2018, 1:40 PM

## 2018-04-20 ENCOUNTER — Encounter: Payer: Self-pay | Admitting: Vascular Surgery

## 2018-04-20 ENCOUNTER — Ambulatory Visit: Payer: Medicare Other

## 2018-04-21 ENCOUNTER — Telehealth: Payer: Self-pay | Admitting: *Deleted

## 2018-04-21 ENCOUNTER — Other Ambulatory Visit: Payer: Self-pay

## 2018-04-21 ENCOUNTER — Inpatient Hospital Stay: Payer: Medicare Other

## 2018-04-21 ENCOUNTER — Other Ambulatory Visit: Payer: Self-pay | Admitting: Urgent Care

## 2018-04-21 DIAGNOSIS — C8339 Diffuse large B-cell lymphoma, extranodal and solid organ sites: Secondary | ICD-10-CM

## 2018-04-21 NOTE — Telephone Encounter (Signed)
Called patient and LVM for patient to come back to lab for redraw today @ 3 PM.  Also tried cell phone, however, got no answer and not voice mailbox is set up.

## 2018-04-23 LAB — GLUCOSE 6 PHOSPHATE DEHYDROGENASE
G6PDH: 12.4 U/g{Hb} (ref 4.6–13.5)
Hemoglobin: 10.8 g/dL — ABNORMAL LOW (ref 11.1–15.9)

## 2018-04-23 NOTE — Progress Notes (Signed)
[facility] day:  04/24/2018  Chief Complaint: [name] is a 72 y.o. 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  HPI: The patient was last seen in the medical oncology clinic on 04/17/2018.  At that time, patient had been feeling "alright". She continued to have intermittent episodes of drenching diaphoresis at night. No other acute symptoms. Eating well; weight stable. Exam was unremarkable. Did not have ordered labs done prior to leaving clinic. Started on allopurinol 50 mg/day (renal dose adjustment).   Patient attended chemotherapy education class with Magdalene Patricia, RN on 04/10/2018. She was provided with written information regarding her proposed course of chemotherapy treatments. Patient was given the opportunity to have her preliminary clinical questions fielded by cancer center nurse navigator.   She returned on 04/18/2018 for routine lab monitoring. WBC 5200 ([facility] 3500). Hemoglobin 10.7, hematocrit 32.2, and platelets 221,000. BUN 31 and creatinine 2.30 (CrCl 15.5 mL/min). Calcium elevated at 11.7. Uric acid elevated at 7.8.  INR 0.94. G6PD assay was ordered, but inadvertantly not drawn by lab.   Patient was contacted on 04/18/2018 to discuss concerns related to decrease in renal function. She is followed by [facility] nephrology practice in [place]. Her provider had left the practice, and she was scheduled to see a new provider soon. Patient scheduled for a STAT renal ultrasound, as there was concern of possible mass effect on her kidneys related to her underlying malignancy. Additionally, call was placed to [facility] nephrology to expedite her appointment.  STAT renal ultrasound was performed on 04/19/2018 that revealed no evidence of obstruction.  There was a 2.1 x 1.5 x 2.7 cm complex cyst noted in the LEFT lower pole of the kidney. No hydronephrosis or hydroureter noted.  Patient  underwent venous access device (port) placement with Dr. Leotis Pain on 04/19/2018. Procedure notes reviewed. Procedure was uncomplicated.   G6PD testing on 04/21/2018 revealed a normal level of 12.4  (4.6-13.5 U/g Hb).   She was seen in consult on 04/21/2018 by Dr. Marian Sorrow (nephrology). Notes reviewed. Patient with developing AKI felt to be related to acute HYPERcalcemia. Labs repeated. BUN 32 and creatinine 1.99. Calcium 11.3. LDH 870. PTH low at 5.6. Uric acid 5.0. HCTZ was discontinued, as it was felt to be contributory. Patient encouraged to increase fluid intake. Suspicion for TLS was low. Patient to follow up with nephrology in 6 weeks.   In the interim, patient is doing well. She denies any acute concerns. Patient denies nausea, vomiting, and changes to her bowel habits. Patient denies any B symptoms. She denies any interval infections.   Patient advises that she maintains an adequate appetite. She is eating well. Weight today is 102 lb 4.8 oz (46.4 kg), which compared to her last visit to the clinic, represents a stable weight. Patient is "drinking a whole lot more that she normally does" per her husband.  Patient voiding normally.   Patient denies pain in the clinic today.   Past Medical History:  Diagnosis Date  . Cancer [facility])    Stomach cancer  . Chronic kidney disease   . GERD (gastroesophageal reflux disease)   . HTN (hypertension)   . Hyperlipidemia   . Lupus ([place])   . S/P angioplasty with stent     Past Surgical History:  Procedure Laterality Date  . CARDIAC SURGERY    . CESAREAN SECTION    . COLONOSCOPY N/A  12/30/2017   Procedure: COLONOSCOPY;  Surgeon: Lin Landsman, MD;  Location: [facility] ENDOSCOPY;  Service: Gastroenterology;  Laterality: N/A;  . CORONARY ARTERY BYPASS GRAFT  2003  . ESOPHAGOGASTRODUODENOSCOPY N/A 12/30/2017   Procedure: ESOPHAGOGASTRODUODENOSCOPY (EGD);  Surgeon: Lin Landsman, MD;  Location: [facility] ENDOSCOPY;  Service:  Gastroenterology;  Laterality: N/A;  . ESOPHAGOGASTRODUODENOSCOPY Left 02/05/2018   Procedure: ESOPHAGOGASTRODUODENOSCOPY (EGD);  Surgeon: Virgel Manifold, MD;  Location: [facility] ENDOSCOPY;  Service: Endoscopy;  Laterality: Left;  . ESOPHAGOGASTRODUODENOSCOPY (EGD) WITH PROPOFOL N/A 03/15/2018   Procedure: ESOPHAGOGASTRODUODENOSCOPY (EGD) WITH PROPOFOL;  Surgeon: Virgel Manifold, MD;  Location: [facility] ENDOSCOPY;  Service: Endoscopy;  Laterality: N/A;  . FLEXIBLE SIGMOIDOSCOPY N/A 12/31/2017   Procedure: FLEXIBLE SIGMOIDOSCOPY;  Surgeon: Jonathon Bellows, MD;  Location: [facility] ENDOSCOPY;  Service: Gastroenterology;  Laterality: N/A;  . PORTA CATH INSERTION N/A 04/19/2018   Procedure: PORTA CATH INSERTION;  Surgeon: Algernon Huxley, MD;  Location: [place] CV LAB;  Service: Cardiovascular;  Laterality: N/A;    Family History  Problem Relation Age of Onset  . Hypertension Mother   . Stroke Mother   . Vascular Disease Mother   . Hypertension Sister     Social History:  reports that she quit smoking about 16 years ago. Her smoking use included cigarettes. She has a 10.00 pack-year smoking history. She has never used smokeless tobacco. She reports that she drank alcohol. She reports that she does not use drugs.  She has smoked 1/2 pack/day x 20 years.  She stopped smoking in 2000.  She denies any exposure to radiation or toxins.  She is from [place].  She moved to the [place] 52 years ago after marrying her husband.  She was in the [employer].  The patient is accompanied by her husband and daughter today.  Allergies:  Allergies  Allergen Reactions  . Penicillins Other (See Comments)    Pass out Has patient had a PCN reaction causing immediate rash, facial/tongue/throat swelling, SOB or lightheadedness with hypotension: Yes Has patient had a PCN reaction causing severe rash involving mucus membranes or skin necrosis: No Has patient had a PCN reaction that required hospitalization: No Has patient had  a PCN reaction occurring within the last 10 years: No If all of the above answers are "NO", then may proceed with Cephalosporin use.   . Sulfa Antibiotics Nausea Only  . Sulfasalazine Nausea And Vomiting       . Ace Inhibitors Nausea Only  . Losartan Anxiety  . Spironolactone Other (See Comments)    Felt poorly but couldn't clarify     Current Medications: Current Outpatient Medications  Medication Sig Dispense Refill  . acetaminophen (TYLENOL) 500 MG tablet Take 1,000 mg by mouth 2 (two) times daily as needed for moderate pain or headache.    . alendronate (FOSAMAX) 70 MG tablet Take 70 mg by mouth every Saturday.     Marland Kitchen allopurinol (ZYLOPRIM) 100 MG tablet Take 0.5 tablets (50 mg total) by mouth daily. 15 tablet 0  . amLODipine (NORVASC) 5 MG tablet Take 5 mg by mouth daily.     . clotrimazole-betamethasone (LOTRISONE) cream Apply 1 application topically 2 (two) times daily as needed (rash).     . feeding supplement, ENSURE ENLIVE, (ENSURE ENLIVE) LIQD Take 237 mLs by mouth 2 (two) times daily between meals. (Patient taking differently: Take 237 mLs by mouth daily. ) 90 Bottle 0  . ferrous sulfate 325 (65 FE) MG tablet Take 1 tablet (325 mg total) by mouth  2 (two) times daily with a meal. 60 tablet 3  . fluticasone (FLONASE) 50 MCG/ACT nasal spray Place 1 spray into both nostrils daily as needed for allergies or rhinitis.    . hydroxychloroquine (PLAQUENIL) 200 MG tablet Take 200 mg by mouth 2 (two) times daily.     Marland Kitchen lidocaine-prilocaine (EMLA) cream Apply to affected area once 30 g 3  . Multiple Vitamin (MULTIVITAMIN WITH MINERALS) TABS tablet Take 1 tablet by mouth daily. 180 tablet 0  . pantoprazole (PROTONIX) 40 MG tablet Take 1 tablet (40 mg total) by mouth 2 (two) times daily before a meal. 60 tablet 1  . polyethylene glycol (MIRALAX / GLYCOLAX) packet Take 17 g by mouth daily.    . predniSONE (DELTASONE) 20 MG tablet Take 3 tablets (60 mg total) by mouth daily. Take on days 1-5  of chemotherapy. 15 tablet 5  . Pseudoephedrine-APAP 30-325 MG TABS Take 1 tablet by mouth daily as needed (sinuses).     . ranitidine (ZANTAC) 150 MG tablet Take 150 mg by mouth daily as needed for heartburn.     . simvastatin (ZOCOR) 40 MG tablet Take 40 mg by mouth daily.     . traMADol (ULTRAM) 50 MG tablet Take 50 mg by mouth 2 (two) times daily as needed for moderate pain.     . hydrochlorothiazide (HYDRODIURIL) 25 MG tablet Take 25 mg by mouth daily.     . ondansetron (ZOFRAN) 4 MG tablet Take 1 tablet (4 mg total) by mouth 2 (two) times daily as needed for refractory nausea / vomiting. Start on day 3 after cyclophosphamide chemotherapy. (Patient not taking: Reported on 04/24/2018) 20 tablet 1  . predniSONE (DELTASONE) 5 MG tablet Take 5 mg by mouth.     No current facility-administered medications for this visit.    Facility-Administered Medications Ordered in Other Visits  Medication Dose Route Frequency Provider Last Rate Last Dose  . heparin lock flush 100 unit/mL  500 Units Intravenous Once Corcoran, Melissa C, MD      . sodium chloride flush (NS) 0.9 % injection 10 mL  10 mL Intravenous PRN Lequita Asal, MD   10 mL at 04/24/18 0815    Review of Systems  Constitutional: Positive for diaphoresis (intermittent drenching). Negative for fever, malaise/fatigue and weight loss (stable).  HENT: Negative.   Eyes: Negative.   Respiratory: Negative for cough, hemoptysis, sputum production and shortness of breath.   Cardiovascular: Negative for chest pain, palpitations, orthopnea, leg swelling and PND.  Gastrointestinal: Negative for abdominal pain, blood in stool, constipation, diarrhea, melena, nausea and vomiting.  Genitourinary: Negative for dysuria, frequency, hematuria and urgency.       PMH (+) for lupus nephritis. Voiding normally.   Musculoskeletal: Negative for back pain, falls, joint pain and myalgias.  Skin: Negative for itching and rash.       PAC to RIGHT chest wall   Neurological: Negative for dizziness, tremors, weakness and headaches.  Endo/Heme/Allergies: Does not bruise/bleed easily.  Psychiatric/Behavioral: Negative for depression, memory loss and suicidal ideas. The patient is not nervous/anxious and does not have insomnia.   All other systems reviewed and are negative.  Performance status (ECOG): 1 - Symptomatic but completely ambulatory  Vital Signs: BP (!) 149/82 (BP Location: Left Arm, Patient Position: Sitting)   Pulse 94   Temp 98 F (36.7 C) (Tympanic)   Resp 18   Wt 102 lb 4.8 oz (46.4 kg)   BMI 20.66 kg/m   Physical Exam  Constitutional:  She is oriented to person, place, and time and well-developed, well-nourished, and in no distress.  HENT:  Head: Normocephalic and atraumatic.  Mouth/Throat: She has dentures.  Dark curly hair  Eyes: Pupils are equal, round, and reactive to light. EOM are normal. No scleral icterus.  Glasses. Brown eyes.   Neck: Normal range of motion. Neck supple. No tracheal deviation present. No thyromegaly present.  Cardiovascular: Normal rate, regular rhythm and normal heart sounds. Exam reveals no gallop and no friction rub.  No murmur heard. Pulmonary/Chest: Effort normal and breath sounds normal. No respiratory distress. She has no wheezes. She has no rales.  Abdominal: Soft. Bowel sounds are normal. She exhibits no distension. There is no tenderness.  Musculoskeletal: Normal range of motion. She exhibits no edema or tenderness.  Lymphadenopathy:    She has no cervical adenopathy.    She has no axillary adenopathy.       Right: No inguinal and no supraclavicular adenopathy present.       Left: No inguinal and no supraclavicular adenopathy present.  Neurological: She is alert and oriented to person, place, and time.  Skin: Skin is warm and dry. No rash noted. No erythema.  Psychiatric: Mood, affect and judgment normal.  Nursing note and vitals reviewed.   Infusion on 04/24/2018  Component Date  Value Ref Range Status  . LDH 04/24/2018 264* 98 - 192 U/L Final   Performed at [facility], [address] [place], [place] [address]  . Sodium 04/24/2018 140  135 - 145 mmol/L Final  . Potassium 04/24/2018 3.6  3.5 - 5.1 mmol/L Final  . Chloride 04/24/2018 103  98 - 111 mmol/L Final  . CO2 04/24/2018 27  22 - 32 mmol/L Final  . Glucose, Bld 04/24/2018 178* 70 - 99 mg/dL Final  . BUN 04/24/2018 28* 8 - 23 mg/dL Final  . Creatinine, Ser 04/24/2018 1.88* 0.44 - 1.00 mg/dL Final  . Calcium 04/24/2018 10.5* 8.9 - 10.3 mg/dL Final  . Total Protein 04/24/2018 6.0* 6.5 - 8.1 g/dL Final  . Albumin 04/24/2018 3.6  3.5 - 5.0 g/dL Final  . AST 04/24/2018 34  15 - 41 U/L Final  . ALT 04/24/2018 12  0 - 44 U/L Final  . Alkaline Phosphatase 04/24/2018 63  38 - 126 U/L Final  . Total Bilirubin 04/24/2018 0.3  0.3 - 1.2 mg/dL Final  . GFR calc non Af Amer 04/24/2018 26* >60 mL/min Final  . GFR calc Af Amer 04/24/2018 30* >60 mL/min Final   Comment: (NOTE) The eGFR has been calculated using the CKD EPI equation. This calculation has not been validated in all clinical situations. eGFR's persistently <60 mL/min signify possible Chronic Kidney Disease.   Georgiann Hahn gap 04/24/2018 10  5 - 15 Final   Performed at [facility], [address] [place],  [address]  . WBC 04/24/2018 4.8  3.6 - 11.0 K/uL Final  . RBC 04/24/2018 3.47* 3.80 - 5.20 MIL/uL Final  . Hemoglobin 04/24/2018 10.8* 12.0 - 16.0 g/dL Final  . HCT 04/24/2018 32.0* 35.0 - 47.0 % Final  . MCV 04/24/2018 92.0  80.0 - 100.0 fL Final  . MCH 04/24/2018 31.0  26.0 - 34.0 pg Final  . MCHC 04/24/2018 33.7  32.0 - 36.0 g/dL Final  . RDW 04/24/2018 14.0  11.5 - 14.5 % Final  . Platelets 04/24/2018 209  150 - 440 K/uL Final  . Neutrophils Relative % 04/24/2018 59  % Final  . Neutro Abs  04/24/2018 2.9  1.4 - 6.5 K/uL Final  . Lymphocytes Relative 04/24/2018 19  % Final  . Lymphs Abs 04/24/2018 0.9* 1.0 - 3.6 K/uL Final  .  Monocytes Relative 04/24/2018 14  % Final  . Monocytes Absolute 04/24/2018 0.7  0.2 - 0.9 K/uL Final  . Eosinophils Relative 04/24/2018 7  % Final  . Eosinophils Absolute 04/24/2018 0.3  0 - 0.7 K/uL Final  . Basophils Relative 04/24/2018 1  % Final  . Basophils Absolute 04/24/2018 0.0  0 - 0.1 K/uL Final   Performed at [facility], [address] [place], [place] [address]    Assessment:  In Maddalynn Barnard is a 72 y.o. 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  IPI is 3 (intermediate-high risk), age-adjusted IPI is 2 (low-intermediate risk), revised IPI is 3 (poor), and NCCN-IPI is 5 (intermediate-high risk).  CNS risk is 3 (intermediate: age, LDH, stage).  EGD on 03/15/2018 revealed pathology from the duodenal scalloped lesion and duodenal sweep ulcer with involvement with diffuse large B cell lymphoma.  Random gastric biopsy was negative for lymphoma.  There was mild chronic gastritis with reactive foveolar hyperplasia.  Gastric fundus scar revealed involvement with diffuse large B cell lymphoma.  GE junction biopsy revealed no lymphoma with squamocolumnar mucosa with mild chronic active inflammation.  The lymphoma has a non-germinal center immunophenotype with a Ki-67 proliferation index of 60% and shows Bcl-2/CMYC co-expression by  immunohistochemical analysis.   Summary of IHC results, neoplastic cells: Positive: PAX-5, CD20(variable), Bcl-2, MUM1, Bcl-6 (variable), CMYC.  Negative: CD10, CD5, CD30, CD138, Cyclin-D1, ALK-1, CD3.  EBV (ISH): Negative.  FISH for MYC gene rearrangement (8q24 gene region) was negative.  PET scan on 04/06/2018 revealed enlarged and hypermetabolic supraclavicular, mediastinal, abdominal and pelvic adenopathy. Additional smaller hypermetabolic lymph nodes identified within the left cervical lymph node chain. Largest lymph node was a 5.7 cm subcarinal lymph node (SUV 26.78).  Bone marrow aspirate and biopsy  on 04/12/2018 revealed a slightly hypercellular bone marrow for age with trilineage hematopoiesis.  There were dyspoietic changes associated with the granulocyte series.  Flow cytometry revealed a predominance of T lymphocytes with relative abundance of CD8 + cells.  There was no significant B cell population.  Cytogenetics are pending.  Echo on 04/04/2018 revealed an EF of 55-60%.  She presented with a normocytic anemia.  She was admitted x 2 (12/2017 and 01/2018) with rectal bleeding.  She is intolerant of oral iron.  Diet is modest.  EGD on 12/30/2017 revealed non bleeding gastric erosions in the antrum.  Colonoscopy on 12/30/2017 revealed numerous polyps (one 5 mm in ascending colon, two 7 mm polyps in transverse colon, five 5-7 mm polyps in the descending colon, one 7 mm polyp in the sigmoid colon, one 8 mm polyp in the recto-sigmoid colon, and one 19 mm polyp in the rectum). 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    She developed hematochezia post colonoscopy.  Flexible sigmoidoscopy on 12/31/2017 revealed a rectal ulcer at the site of the polypectomy and blood in the rectum.  Clips were placed.  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  Work-up on 04/03/2018 revealed a hematocrit of 32.5, hemoglobin 10.7, MCV 91.7, platelets 207,000, WBC 1800 with an ANC of 600.  Uric acid was 8.8.  LDH was 241 (98-192) and beta2-microglobulin  5.8 (0.6-2.4).  Negative studies  included:  hepatitis B core antibody total, hepatitis B surface antigen, hepatitis C antibody, HIV testing.     She has required 4 units of PRBCs (3 units in 12/2017 and 1 unit in 01/2018).  Ferritin has been followed: 30 on 12/29/2017, 93 on 02/13/2018, 57 on 03/07/2018, and 38 on 04/03/2018.  Labs on 12/29/2017 revealed the following:  B12 was 254 (low normal).  She has been on oral B12 since  03/07/2018.  B12 was 1524 on 04/03/2018.  Folate 6.3 (> 5.9) on 12/29/2017 and 55.8 on 04/03/2018.    She has chronic renal insufficiency.  Creatinine was 1.42 (CrCl 25 ml/min) on 04/03/2018.  She has lupus (diagnosed 1982). She is on prednisone 5 mg a day and Plaquenil.   Symptomatically, patient is doing well overall. She denies acute concerns. She denies any B symptoms or recent infections.  Appetite stable; no weight loss. Exam is stable.   Plan: 1. Labs today: CBC with diff, CMP, LDH, uric acid, PTHrp, 1,25-dihydroxyvitamin D, 25,-hydroxyvitamin D, TSH, free T4. 2. DLBCL  Review stage IVB disease.  Review IPI score. Disease has not invaded the marrow. Risk of CNS involvement felt to be low.   Review plans for treatment with mini RCHOP regimen with Neulasta support q21 days x 6 cycles. Side effects reviewed. Questions fielded. Patient provides verbal consent to proceed with proposed treatment regimen.   Rituxan 375 mg/m  Cytoxan 400 mg/m (full dose 750 mg/m)  Adriamycin 25 mg/m (full dose 50 mg/m),  Vincristine 1 mg (full dose 2 mg; 1.4 mg/m)  Prednisone 40 mg/m days 1-5  Labs reviewed. Blood counts stable and adequate enough for treatment. Will proceed with cycle #1 mini RCHOP Discuss symptom management.  Patient has antiemetics and pain medications at home to use on a PRN basis. Patient  advising that the prescribed interventions are adequate at this point. Continue all medications as previously prescribed.  3. Renal insufficiency  BUN 28 and creatinine 1.88 (CrCl 19 mL/min) today.   Renal US (-) for malignancy related mass effect.  Review consult with Dr. Percell Miller (nephrology).   Has lupus nephritis causing chronic renal insufficiency.   ACUTE kidney injury felt to be related to HYPERcalcemia (calcium was 11.7).   HCTZ discontinued and patient encouraged to increase fluid intake.  Follows up with nephrology in 6 weeks.    Main recommendation from nephrology  is to maintain adequate hydration.   Patient drinking well, however given renal issues, will bring patient in daily this week for 500 cc 0.9% NS.  4. HYPERcalcemia  Calcium level has improved to 10.5 with the discontinuation of the thiazide diuretic and with increased fluid intake.   Additional work-up today 5. Thyroid dysfunction  Persistent elevation in TSH level to 10.950 (0.350- 4.500 uIU/mL).   Check free T4.   Results forwarded to PCP for review.  6. Anemia  Hemoglobin 10.8 and hematocrit 32.0  Multifactorial issue related to underlying kidney disease, iron deficiency, and possibly Plaquenil. 7. Leukopenia  WBC 4800 (ANC 2900, ALC 900).   Bone marrow reveals dysplastic changes in granulocytic series.  Etiology may be secondary to Plaquenil, as it has been associated with leukopenia. 8. TLS monitoring  Uric acid 4.5, potassium 3.6, LDH 264.  No concerns for TLS at this time.   Continue to maintain adequate hydration and daily prophylactic allopurinol 50 mg dose.   G6PD assay was normal.  Discuss use of rasburicase if hyperuricemia unable to be controlled. 9. B12 deficiency  B12 level normal on  04/03/2018 (has been on oral B12 since 03/07/2018).  Continue oral B12 1,000 mcg supplementation daily. 10. RTC daily this week for 500 cc 0.9% NS. 11. RTC on 04/25/2018 for Neulasta and labs (BMP, uric acid). 12. RTC on 05/04/2018 for MD assessment and labs (CBC with diff, CMP, Mg), and +/- IVFs 13. RTC on 05/15/2018 for MD assessment, labs (CBC with diff, CMP, Mg), and cycle #2 mini RCHOP with Udenyca support.   Honor Loh, NP  04/24/2018, 9:21 AM   I saw and evaluated the patient, participating in the key portions of the service and reviewing pertinent diagnostic studies and records.  I reviewed the nurse practitioner's note and agree with the findings and the plan.  The assessment and plan were discussed with the patient.  Extensive questions were asked by the patient  and answered.   Nolon Stalls, MD 04/24/2018,9:21 AM

## 2018-04-24 ENCOUNTER — Other Ambulatory Visit: Payer: Self-pay | Admitting: Hematology and Oncology

## 2018-04-24 ENCOUNTER — Other Ambulatory Visit: Payer: Self-pay

## 2018-04-24 ENCOUNTER — Inpatient Hospital Stay (HOSPITAL_BASED_OUTPATIENT_CLINIC_OR_DEPARTMENT_OTHER): Payer: Medicare Other | Admitting: Hematology and Oncology

## 2018-04-24 ENCOUNTER — Inpatient Hospital Stay: Payer: Medicare Other

## 2018-04-24 ENCOUNTER — Encounter: Payer: Self-pay | Admitting: Hematology and Oncology

## 2018-04-24 VITALS — BP 149/82 | HR 94 | Temp 98.0°F | Resp 18 | Wt 102.3 lb

## 2018-04-24 DIAGNOSIS — R61 Generalized hyperhidrosis: Secondary | ICD-10-CM

## 2018-04-24 DIAGNOSIS — K219 Gastro-esophageal reflux disease without esophagitis: Secondary | ICD-10-CM

## 2018-04-24 DIAGNOSIS — Z5112 Encounter for antineoplastic immunotherapy: Secondary | ICD-10-CM | POA: Insufficient documentation

## 2018-04-24 DIAGNOSIS — Z7952 Long term (current) use of systemic steroids: Secondary | ICD-10-CM

## 2018-04-24 DIAGNOSIS — I129 Hypertensive chronic kidney disease with stage 1 through stage 4 chronic kidney disease, or unspecified chronic kidney disease: Secondary | ICD-10-CM

## 2018-04-24 DIAGNOSIS — C8339 Diffuse large B-cell lymphoma, extranodal and solid organ sites: Secondary | ICD-10-CM

## 2018-04-24 DIAGNOSIS — N179 Acute kidney failure, unspecified: Secondary | ICD-10-CM

## 2018-04-24 DIAGNOSIS — D72819 Decreased white blood cell count, unspecified: Secondary | ICD-10-CM | POA: Diagnosis not present

## 2018-04-24 DIAGNOSIS — M3214 Glomerular disease in systemic lupus erythematosus: Secondary | ICD-10-CM

## 2018-04-24 DIAGNOSIS — R7989 Other specified abnormal findings of blood chemistry: Secondary | ICD-10-CM

## 2018-04-24 DIAGNOSIS — Z951 Presence of aortocoronary bypass graft: Secondary | ICD-10-CM

## 2018-04-24 DIAGNOSIS — Z5111 Encounter for antineoplastic chemotherapy: Secondary | ICD-10-CM | POA: Insufficient documentation

## 2018-04-24 DIAGNOSIS — D509 Iron deficiency anemia, unspecified: Secondary | ICD-10-CM

## 2018-04-24 DIAGNOSIS — E538 Deficiency of other specified B group vitamins: Secondary | ICD-10-CM

## 2018-04-24 DIAGNOSIS — N189 Chronic kidney disease, unspecified: Secondary | ICD-10-CM

## 2018-04-24 DIAGNOSIS — E785 Hyperlipidemia, unspecified: Secondary | ICD-10-CM

## 2018-04-24 DIAGNOSIS — Z882 Allergy status to sulfonamides status: Secondary | ICD-10-CM

## 2018-04-24 DIAGNOSIS — Z79899 Other long term (current) drug therapy: Secondary | ICD-10-CM

## 2018-04-24 DIAGNOSIS — Z7189 Other specified counseling: Secondary | ICD-10-CM

## 2018-04-24 DIAGNOSIS — Z87891 Personal history of nicotine dependence: Secondary | ICD-10-CM

## 2018-04-24 LAB — COMPREHENSIVE METABOLIC PANEL
ALT: 12 U/L (ref 0–44)
AST: 34 U/L (ref 15–41)
Albumin: 3.6 g/dL (ref 3.5–5.0)
Alkaline Phosphatase: 63 U/L (ref 38–126)
Anion gap: 10 (ref 5–15)
BUN: 28 mg/dL — ABNORMAL HIGH (ref 8–23)
CO2: 27 mmol/L (ref 22–32)
Calcium: 10.5 mg/dL — ABNORMAL HIGH (ref 8.9–10.3)
Chloride: 103 mmol/L (ref 98–111)
Creatinine, Ser: 1.88 mg/dL — ABNORMAL HIGH (ref 0.44–1.00)
GFR calc Af Amer: 30 mL/min — ABNORMAL LOW (ref >60)
GFR calc non Af Amer: 26 mL/min — ABNORMAL LOW (ref >60)
Glucose, Bld: 178 mg/dL — ABNORMAL HIGH (ref 70–99)
Potassium: 3.6 mmol/L (ref 3.5–5.1)
Sodium: 140 mmol/L (ref 135–145)
Total Bilirubin: 0.3 mg/dL (ref 0.3–1.2)
Total Protein: 6 g/dL — ABNORMAL LOW (ref 6.5–8.1)

## 2018-04-24 LAB — URIC ACID: Uric Acid, Serum: 4.5 mg/dL (ref 2.5–7.1)

## 2018-04-24 LAB — CBC WITH DIFFERENTIAL/PLATELET
Basophils Absolute: 0 10*3/uL (ref 0–0.1)
Basophils Relative: 1 %
Eosinophils Absolute: 0.3 10*3/uL (ref 0–0.7)
Eosinophils Relative: 7 %
HCT: 32 % — ABNORMAL LOW (ref 35.0–47.0)
Hemoglobin: 10.8 g/dL — ABNORMAL LOW (ref 12.0–16.0)
Lymphocytes Relative: 19 %
Lymphs Abs: 0.9 10*3/uL — ABNORMAL LOW (ref 1.0–3.6)
MCH: 31 pg (ref 26.0–34.0)
MCHC: 33.7 g/dL (ref 32.0–36.0)
MCV: 92 fL (ref 80.0–100.0)
Monocytes Absolute: 0.7 10*3/uL (ref 0.2–0.9)
Monocytes Relative: 14 %
Neutro Abs: 2.9 10*3/uL (ref 1.4–6.5)
Neutrophils Relative %: 59 %
Platelets: 209 10*3/uL (ref 150–440)
RBC: 3.47 MIL/uL — ABNORMAL LOW (ref 3.80–5.20)
RDW: 14 % (ref 11.5–14.5)
WBC: 4.8 10*3/uL (ref 3.6–11.0)

## 2018-04-24 LAB — LACTATE DEHYDROGENASE: LDH: 264 U/L — ABNORMAL HIGH (ref 98–192)

## 2018-04-24 LAB — TSH: TSH: 10.95 u[IU]/mL — ABNORMAL HIGH (ref 0.350–4.500)

## 2018-04-24 LAB — T4, FREE: Free T4: 0.87 ng/dL (ref 0.82–1.77)

## 2018-04-24 MED ORDER — PALONOSETRON HCL INJECTION 0.25 MG/5ML
0.2500 mg | Freq: Once | INTRAVENOUS | Status: AC
  Administered 2018-04-24: 0.25 mg via INTRAVENOUS
  Filled 2018-04-24: qty 5

## 2018-04-24 MED ORDER — LIDOCAINE-PRILOCAINE 2.5-2.5 % EX CREA: TOPICAL_CREAM | CUTANEOUS | 3 refills | Status: DC

## 2018-04-24 MED ORDER — SODIUM CHLORIDE 0.9 % IV SOLN: 10.0000 mg | Freq: Once | INTRAVENOUS | Status: DC

## 2018-04-24 MED ORDER — SODIUM CHLORIDE 0.9 % IV SOLN
Freq: Once | INTRAVENOUS | Status: AC
  Administered 2018-04-24: 10:00:00 via INTRAVENOUS
  Filled 2018-04-24: qty 250

## 2018-04-24 MED ORDER — DEXAMETHASONE SODIUM PHOSPHATE 10 MG/ML IJ SOLN
10.0000 mg | Freq: Once | INTRAMUSCULAR | Status: AC
  Administered 2018-04-24: 10 mg via INTRAVENOUS
  Filled 2018-04-24: qty 1

## 2018-04-24 MED ORDER — SODIUM CHLORIDE 0.9% FLUSH
10.0000 mL | INTRAVENOUS | Status: DC | PRN
  Administered 2018-04-24: 10 mL via INTRAVENOUS
  Filled 2018-04-24: qty 10

## 2018-04-24 MED ORDER — PREDNISONE 20 MG PO TABS: 40.0000 mg/m2 | ORAL_TABLET | Freq: Every day | ORAL | 5 refills | Status: DC

## 2018-04-24 MED ORDER — HEPARIN SOD (PORK) LOCK FLUSH 100 UNIT/ML IV SOLN
500.0000 [IU] | Freq: Once | INTRAVENOUS | Status: AC
  Administered 2018-04-24: 500 [IU] via INTRAVENOUS
  Filled 2018-04-24: qty 5

## 2018-04-24 MED ORDER — DIPHENHYDRAMINE HCL 25 MG PO CAPS
50.0000 mg | ORAL_CAPSULE | Freq: Once | ORAL | Status: AC
  Administered 2018-04-24: 50 mg via ORAL
  Filled 2018-04-24: qty 2

## 2018-04-24 MED ORDER — SODIUM CHLORIDE 0.9 % IV SOLN
400.0000 mg/m2 | Freq: Once | INTRAVENOUS | Status: AC
  Administered 2018-04-24: 560 mg via INTRAVENOUS
  Filled 2018-04-24: qty 28

## 2018-04-24 MED ORDER — VINCRISTINE SULFATE CHEMO INJECTION 1 MG/ML
1.0000 mg | Freq: Once | INTRAVENOUS | Status: AC
  Administered 2018-04-24: 1 mg via INTRAVENOUS
  Filled 2018-04-24: qty 1

## 2018-04-24 MED ORDER — SODIUM CHLORIDE 0.9 % IV SOLN
375.0000 mg/m2 | Freq: Once | INTRAVENOUS | Status: AC
  Administered 2018-04-24: 500 mg via INTRAVENOUS
  Filled 2018-04-24: qty 50

## 2018-04-24 MED ORDER — DOXORUBICIN HCL CHEMO IV INJECTION 2 MG/ML
25.0000 mg/m2 | Freq: Once | INTRAVENOUS | Status: AC
  Administered 2018-04-24: 34 mg via INTRAVENOUS
  Filled 2018-04-24: qty 17

## 2018-04-24 MED ORDER — ONDANSETRON HCL 4 MG PO TABS: 4.0000 mg | ORAL_TABLET | Freq: Two times a day (BID) | ORAL | 1 refills | Status: DC | PRN

## 2018-04-24 MED ORDER — ACETAMINOPHEN 325 MG PO TABS
650.0000 mg | ORAL_TABLET | Freq: Once | ORAL | Status: AC
  Administered 2018-04-24: 650 mg via ORAL
  Filled 2018-04-24: qty 2

## 2018-04-24 NOTE — Progress Notes (Signed)
Creatinine 1.88 today. Per Honor Loh, NP proceed with treatment

## 2018-04-24 NOTE — Progress Notes (Signed)
Here for follow up- per pt " feel ok ". Per husband Dr Percell Miller took pt off HCTZ stating that may be cause of elevated calcium -

## 2018-04-25 ENCOUNTER — Other Ambulatory Visit: Payer: Self-pay

## 2018-04-25 ENCOUNTER — Inpatient Hospital Stay: Payer: Medicare Other

## 2018-04-25 VITALS — BP 124/71 | HR 66 | Temp 97.9°F | Resp 16

## 2018-04-25 DIAGNOSIS — Z5111 Encounter for antineoplastic chemotherapy: Secondary | ICD-10-CM

## 2018-04-25 DIAGNOSIS — D500 Iron deficiency anemia secondary to blood loss (chronic): Secondary | ICD-10-CM

## 2018-04-25 DIAGNOSIS — C8339 Diffuse large B-cell lymphoma, extranodal and solid organ sites: Secondary | ICD-10-CM | POA: Diagnosis not present

## 2018-04-25 DIAGNOSIS — Z95828 Presence of other vascular implants and grafts: Secondary | ICD-10-CM

## 2018-04-25 LAB — BASIC METABOLIC PANEL
ANION GAP: 9 (ref 5–15)
BUN: 29 mg/dL — AB (ref 8–23)
CHLORIDE: 103 mmol/L (ref 98–111)
CO2: 26 mmol/L (ref 22–32)
Calcium: 9.8 mg/dL (ref 8.9–10.3)
Creatinine, Ser: 1.34 mg/dL — ABNORMAL HIGH (ref 0.44–1.00)
GFR calc Af Amer: 45 mL/min — ABNORMAL LOW (ref >60)
GFR, EST NON AFRICAN AMERICAN: 39 mL/min — AB (ref >60)
GLUCOSE: 123 mg/dL — AB (ref 70–99)
POTASSIUM: 3.7 mmol/L (ref 3.5–5.1)
Sodium: 138 mmol/L (ref 135–145)

## 2018-04-25 LAB — URIC ACID: Uric Acid, Serum: 6.2 mg/dL (ref 2.5–7.1)

## 2018-04-25 LAB — VITAMIN D 25 HYDROXY (VIT D DEFICIENCY, FRACTURES): Vit D, 25-Hydroxy: 32.1 ng/mL (ref 30.0–100.0)

## 2018-04-25 MED ORDER — PEGFILGRASTIM-CBQV 6 MG/0.6ML ~~LOC~~ SOSY
6.0000 mg | PREFILLED_SYRINGE | Freq: Once | SUBCUTANEOUS | Status: AC
  Administered 2018-04-25: 6 mg via SUBCUTANEOUS
  Filled 2018-04-25: qty 0.6

## 2018-04-25 MED ORDER — SODIUM CHLORIDE 0.9 % IV SOLN
Freq: Once | INTRAVENOUS | Status: AC
  Administered 2018-04-25: 14:00:00 via INTRAVENOUS
  Filled 2018-04-25: qty 250

## 2018-04-25 MED ORDER — HEPARIN SOD (PORK) LOCK FLUSH 100 UNIT/ML IV SOLN
500.0000 [IU] | Freq: Once | INTRAVENOUS | Status: AC
  Administered 2018-04-25: 500 [IU] via INTRAVENOUS

## 2018-04-25 MED ORDER — SODIUM CHLORIDE 0.9% FLUSH
10.0000 mL | INTRAVENOUS | Status: DC | PRN
  Administered 2018-04-25: 10 mL via INTRAVENOUS
  Filled 2018-04-25: qty 10

## 2018-04-25 NOTE — Progress Notes (Signed)
Confirmed with Doran Durand, NP that patient is to get the Udenyca injection today.

## 2018-04-26 ENCOUNTER — Encounter (HOSPITAL_COMMUNITY): Payer: Self-pay | Admitting: Hematology and Oncology

## 2018-04-26 ENCOUNTER — Inpatient Hospital Stay: Payer: Medicare Other

## 2018-04-26 DIAGNOSIS — D500 Iron deficiency anemia secondary to blood loss (chronic): Secondary | ICD-10-CM

## 2018-04-26 DIAGNOSIS — C8339 Diffuse large B-cell lymphoma, extranodal and solid organ sites: Secondary | ICD-10-CM | POA: Diagnosis not present

## 2018-04-26 DIAGNOSIS — Z95828 Presence of other vascular implants and grafts: Secondary | ICD-10-CM

## 2018-04-26 MED ORDER — HEPARIN SOD (PORK) LOCK FLUSH 100 UNIT/ML IV SOLN
500.0000 [IU] | Freq: Once | INTRAVENOUS | Status: AC
  Administered 2018-04-26: 500 [IU] via INTRAVENOUS

## 2018-04-26 MED ORDER — SODIUM CHLORIDE 0.9% FLUSH
10.0000 mL | INTRAVENOUS | Status: DC | PRN
  Administered 2018-04-26: 10 mL via INTRAVENOUS
  Filled 2018-04-26: qty 10

## 2018-04-26 MED ORDER — SODIUM CHLORIDE 0.9 % IV SOLN
Freq: Once | INTRAVENOUS | Status: AC
  Administered 2018-04-26: 09:00:00 via INTRAVENOUS
  Filled 2018-04-26: qty 250

## 2018-04-27 ENCOUNTER — Other Ambulatory Visit: Payer: Self-pay | Admitting: Urgent Care

## 2018-04-27 ENCOUNTER — Inpatient Hospital Stay: Payer: Medicare Other

## 2018-04-27 DIAGNOSIS — M3214 Glomerular disease in systemic lupus erythematosus: Secondary | ICD-10-CM

## 2018-04-27 DIAGNOSIS — C8339 Diffuse large B-cell lymphoma, extranodal and solid organ sites: Secondary | ICD-10-CM | POA: Diagnosis not present

## 2018-04-27 DIAGNOSIS — Z9189 Other specified personal risk factors, not elsewhere classified: Secondary | ICD-10-CM

## 2018-04-27 DIAGNOSIS — D500 Iron deficiency anemia secondary to blood loss (chronic): Secondary | ICD-10-CM

## 2018-04-27 MED ORDER — SODIUM CHLORIDE 0.9% FLUSH
10.0000 mL | INTRAVENOUS | Status: DC | PRN
  Administered 2018-04-27: 10 mL via INTRAVENOUS
  Filled 2018-04-27: qty 10

## 2018-04-27 MED ORDER — SODIUM CHLORIDE 0.9 % IV SOLN
Freq: Once | INTRAVENOUS | Status: AC
  Administered 2018-04-27: 15:00:00 via INTRAVENOUS
  Filled 2018-04-27: qty 250

## 2018-04-27 MED ORDER — HEPARIN SOD (PORK) LOCK FLUSH 100 UNIT/ML IV SOLN
500.0000 [IU] | Freq: Once | INTRAVENOUS | Status: AC
  Administered 2018-04-27: 500 [IU] via INTRAVENOUS
  Filled 2018-04-27: qty 5

## 2018-04-28 ENCOUNTER — Inpatient Hospital Stay: Payer: Medicare Other

## 2018-04-28 DIAGNOSIS — D500 Iron deficiency anemia secondary to blood loss (chronic): Secondary | ICD-10-CM

## 2018-04-28 DIAGNOSIS — Z9189 Other specified personal risk factors, not elsewhere classified: Secondary | ICD-10-CM

## 2018-04-28 DIAGNOSIS — M3214 Glomerular disease in systemic lupus erythematosus: Secondary | ICD-10-CM

## 2018-04-28 DIAGNOSIS — Z95828 Presence of other vascular implants and grafts: Secondary | ICD-10-CM

## 2018-04-28 DIAGNOSIS — C8339 Diffuse large B-cell lymphoma, extranodal and solid organ sites: Secondary | ICD-10-CM | POA: Diagnosis not present

## 2018-04-28 LAB — BASIC METABOLIC PANEL
ANION GAP: 6 (ref 5–15)
BUN: 22 mg/dL (ref 8–23)
CALCIUM: 8.3 mg/dL — AB (ref 8.9–10.3)
CO2: 25 mmol/L (ref 22–32)
CREATININE: 1.01 mg/dL — AB (ref 0.44–1.00)
Chloride: 107 mmol/L (ref 98–111)
GFR calc Af Amer: >60 mL/min (ref >60)
GFR, EST NON AFRICAN AMERICAN: 55 mL/min — AB (ref >60)
Glucose, Bld: 103 mg/dL — ABNORMAL HIGH (ref 70–99)
Potassium: 3.7 mmol/L (ref 3.5–5.1)
SODIUM: 138 mmol/L (ref 135–145)

## 2018-04-28 LAB — VITAMIN D 1,25 DIHYDROXY
Vitamin D 1, 25 (OH)2 Total: 83 pg/mL — ABNORMAL HIGH
Vitamin D2 1, 25 (OH)2: <10 pg/mL
Vitamin D3 1, 25 (OH)2: 83 pg/mL

## 2018-04-28 LAB — URIC ACID: URIC ACID, SERUM: 4.5 mg/dL (ref 2.5–7.1)

## 2018-04-28 MED ORDER — SODIUM CHLORIDE 0.9 % IV SOLN
Freq: Once | INTRAVENOUS | Status: AC
  Administered 2018-04-28: 15:00:00 via INTRAVENOUS
  Filled 2018-04-28: qty 250

## 2018-04-28 MED ORDER — HEPARIN SOD (PORK) LOCK FLUSH 100 UNIT/ML IV SOLN
500.0000 [IU] | Freq: Once | INTRAVENOUS | Status: AC
  Administered 2018-04-28: 500 [IU] via INTRAVENOUS
  Filled 2018-04-28: qty 5

## 2018-04-28 MED ORDER — SODIUM CHLORIDE 0.9% FLUSH
10.0000 mL | Freq: Once | INTRAVENOUS | Status: AC
  Administered 2018-04-28: 10 mL via INTRAVENOUS
  Filled 2018-04-28: qty 10

## 2018-04-29 LAB — PTH-RELATED PEPTIDE: PTH-related peptide: <2 pmol/L

## 2018-05-04 ENCOUNTER — Inpatient Hospital Stay: Payer: Medicare Other

## 2018-05-04 ENCOUNTER — Other Ambulatory Visit: Payer: Self-pay | Admitting: Hematology and Oncology

## 2018-05-04 ENCOUNTER — Encounter: Payer: Self-pay | Admitting: Hematology and Oncology

## 2018-05-04 ENCOUNTER — Inpatient Hospital Stay (HOSPITAL_BASED_OUTPATIENT_CLINIC_OR_DEPARTMENT_OTHER): Payer: Medicare Other | Admitting: Hematology and Oncology

## 2018-05-04 VITALS — BP 165/88 | HR 90 | Temp 97.9°F | Resp 18 | Wt 105.0 lb

## 2018-05-04 DIAGNOSIS — C8339 Diffuse large B-cell lymphoma, extranodal and solid organ sites: Secondary | ICD-10-CM

## 2018-05-04 DIAGNOSIS — M3214 Glomerular disease in systemic lupus erythematosus: Secondary | ICD-10-CM

## 2018-05-04 DIAGNOSIS — R110 Nausea: Secondary | ICD-10-CM

## 2018-05-04 DIAGNOSIS — N179 Acute kidney failure, unspecified: Secondary | ICD-10-CM

## 2018-05-04 DIAGNOSIS — K219 Gastro-esophageal reflux disease without esophagitis: Secondary | ICD-10-CM

## 2018-05-04 DIAGNOSIS — E785 Hyperlipidemia, unspecified: Secondary | ICD-10-CM

## 2018-05-04 DIAGNOSIS — N189 Chronic kidney disease, unspecified: Secondary | ICD-10-CM

## 2018-05-04 DIAGNOSIS — Z882 Allergy status to sulfonamides status: Secondary | ICD-10-CM

## 2018-05-04 DIAGNOSIS — I129 Hypertensive chronic kidney disease with stage 1 through stage 4 chronic kidney disease, or unspecified chronic kidney disease: Secondary | ICD-10-CM

## 2018-05-04 DIAGNOSIS — E538 Deficiency of other specified B group vitamins: Secondary | ICD-10-CM

## 2018-05-04 DIAGNOSIS — Z79899 Other long term (current) drug therapy: Secondary | ICD-10-CM

## 2018-05-04 DIAGNOSIS — Z7952 Long term (current) use of systemic steroids: Secondary | ICD-10-CM

## 2018-05-04 DIAGNOSIS — D72819 Decreased white blood cell count, unspecified: Secondary | ICD-10-CM

## 2018-05-04 DIAGNOSIS — Z951 Presence of aortocoronary bypass graft: Secondary | ICD-10-CM

## 2018-05-04 DIAGNOSIS — D649 Anemia, unspecified: Secondary | ICD-10-CM

## 2018-05-04 DIAGNOSIS — K5900 Constipation, unspecified: Secondary | ICD-10-CM

## 2018-05-04 DIAGNOSIS — D696 Thrombocytopenia, unspecified: Secondary | ICD-10-CM

## 2018-05-04 DIAGNOSIS — Z87891 Personal history of nicotine dependence: Secondary | ICD-10-CM

## 2018-05-04 DIAGNOSIS — D509 Iron deficiency anemia, unspecified: Secondary | ICD-10-CM

## 2018-05-04 DIAGNOSIS — D6959 Other secondary thrombocytopenia: Secondary | ICD-10-CM

## 2018-05-04 LAB — CBC WITH DIFFERENTIAL/PLATELET
Abs Immature Granulocytes: 3.49 10*3/uL — ABNORMAL HIGH (ref 0.00–0.07)
Basophils Absolute: 0 10*3/uL (ref 0.0–0.1)
Basophils Relative: 0 %
Eosinophils Absolute: 0 10*3/uL (ref 0.0–0.5)
Eosinophils Relative: 0 %
HCT: 29 % — ABNORMAL LOW (ref 36.0–46.0)
Hemoglobin: 9.2 g/dL — ABNORMAL LOW (ref 12.0–15.0)
Immature Granulocytes: 19 %
Lymphocytes Relative: 6 %
Lymphs Abs: 1 10*3/uL (ref 0.7–4.0)
MCH: 29.6 pg (ref 26.0–34.0)
MCHC: 31.7 g/dL (ref 30.0–36.0)
MCV: 93.2 fL (ref 80.0–100.0)
Monocytes Absolute: 1.6 10*3/uL — ABNORMAL HIGH (ref 0.1–1.0)
Monocytes Relative: 9 %
Neutro Abs: 12.1 10*3/uL — ABNORMAL HIGH (ref 1.7–7.7)
Neutrophils Relative %: 66 %
Platelets: 82 10*3/uL — ABNORMAL LOW (ref 150–400)
RBC: 3.11 MIL/uL — ABNORMAL LOW (ref 3.87–5.11)
RDW: 14.5 % (ref 11.5–15.5)
WBC: 18.2 10*3/uL — ABNORMAL HIGH (ref 4.0–10.5)
nRBC: 1.6 % — ABNORMAL HIGH (ref 0.0–0.2)

## 2018-05-04 LAB — COMPREHENSIVE METABOLIC PANEL
ALT: 22 U/L (ref 0–44)
AST: 38 U/L (ref 15–41)
Albumin: 3.7 g/dL (ref 3.5–5.0)
Alkaline Phosphatase: 90 U/L (ref 38–126)
Anion gap: 7 (ref 5–15)
BUN: 22 mg/dL (ref 8–23)
CO2: 24 mmol/L (ref 22–32)
Calcium: 8.6 mg/dL — ABNORMAL LOW (ref 8.9–10.3)
Chloride: 105 mmol/L (ref 98–111)
Creatinine, Ser: 1.26 mg/dL — ABNORMAL HIGH (ref 0.44–1.00)
GFR calc Af Amer: 49 mL/min — ABNORMAL LOW (ref >60)
GFR calc non Af Amer: 42 mL/min — ABNORMAL LOW (ref >60)
Glucose, Bld: 105 mg/dL — ABNORMAL HIGH (ref 70–99)
Potassium: 4 mmol/L (ref 3.5–5.1)
Sodium: 136 mmol/L (ref 135–145)
Total Bilirubin: 0.6 mg/dL (ref 0.3–1.2)
Total Protein: 5.9 g/dL — ABNORMAL LOW (ref 6.5–8.1)

## 2018-05-04 LAB — URIC ACID: Uric Acid, Serum: 5.3 mg/dL (ref 2.5–7.1)

## 2018-05-04 LAB — MAGNESIUM: Magnesium: 2.1 mg/dL (ref 1.7–2.4)

## 2018-05-04 MED ORDER — HEPARIN SOD (PORK) LOCK FLUSH 100 UNIT/ML IV SOLN
500.0000 [IU] | Freq: Once | INTRAVENOUS | Status: AC
  Administered 2018-05-04: 500 [IU] via INTRAVENOUS
  Filled 2018-05-04: qty 5

## 2018-05-04 MED ORDER — SODIUM CHLORIDE 0.9% FLUSH
10.0000 mL | Freq: Once | INTRAVENOUS | Status: AC
  Administered 2018-05-04: 10 mL via INTRAVENOUS
  Filled 2018-05-04: qty 10

## 2018-05-04 NOTE — Progress Notes (Signed)
[facility] day:  05/04/2018  Chief Complaint: [name] is a 72 y.o. 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  HPI: The patient was last seen in the medical oncology clinic on 04/24/2018.  At that time, she was doing well overall. She denied acute concerns. She denied any B symptoms or recent infections.  Appetite was stable; no weight loss. Exam was stable. She received cycle #1 mini-RCHOP with Neulasta support.  She had follow-up labs on 04/25/2018 and 04/18/2018.  There was no evidence of tumor lysis.  Creatinine and uric acid improved.  She received IVF.  During the interim, patient is doing well. She tolerated her chemotherapy with minimal side effects. Patient has had some minor nausea and constipation. She denies any neuropathy. Patient denies that she has experienced any B symptoms. She denies any interval infections.   Patient advises that she maintains an adequate appetite. She is eating well. Weight today is 105 lb (47.6 kg), which compared to her last visit to the clinic, represents a 3 pound increase. Patient is making conscious effort to increase her daily fluid intake. She is drinking "3 big bottles of water a day".    Patient denies pain in the clinic today.  Patient traveling to [place] this weekend.    Past Medical History:  Diagnosis Date  . Cancer [facility])    Stomach cancer  . Chronic kidney disease   . GERD (gastroesophageal reflux disease)   . HTN (hypertension)   . Hyperlipidemia   . Lupus ([place])   . S/P angioplasty with stent     Past Surgical History:  Procedure Laterality Date  . CARDIAC SURGERY    . CESAREAN SECTION    . COLONOSCOPY N/A 12/30/2017   Procedure: COLONOSCOPY;  Surgeon: Lin Landsman, MD;  Location: [facility] ENDOSCOPY;  Service: Gastroenterology;  Laterality: N/A;  . CORONARY ARTERY BYPASS GRAFT  2003  .  ESOPHAGOGASTRODUODENOSCOPY N/A 12/30/2017   Procedure: ESOPHAGOGASTRODUODENOSCOPY (EGD);  Surgeon: Lin Landsman, MD;  Location: [facility] ENDOSCOPY;  Service: Gastroenterology;  Laterality: N/A;  . ESOPHAGOGASTRODUODENOSCOPY Left 02/05/2018   Procedure: ESOPHAGOGASTRODUODENOSCOPY (EGD);  Surgeon: Virgel Manifold, MD;  Location: [facility] ENDOSCOPY;  Service: Endoscopy;  Laterality: Left;  . ESOPHAGOGASTRODUODENOSCOPY (EGD) WITH PROPOFOL N/A 03/15/2018   Procedure: ESOPHAGOGASTRODUODENOSCOPY (EGD) WITH PROPOFOL;  Surgeon: Virgel Manifold, MD;  Location: [facility] ENDOSCOPY;  Service: Endoscopy;  Laterality: N/A;  . FLEXIBLE SIGMOIDOSCOPY N/A 12/31/2017   Procedure: FLEXIBLE SIGMOIDOSCOPY;  Surgeon: Jonathon Bellows, MD;  Location: [facility] ENDOSCOPY;  Service: Gastroenterology;  Laterality: N/A;  . PORTA CATH INSERTION N/A 04/19/2018   Procedure: PORTA CATH INSERTION;  Surgeon: Algernon Huxley, MD;  Location: [facility] LAB;  Service: Cardiovascular;  Laterality: N/A;    Family History  Problem Relation Age of Onset  . Hypertension Mother   . Stroke Mother   . Vascular Disease Mother   . Hypertension Sister     Social History:  reports that she quit smoking about 16 years ago. Her smoking use included cigarettes. She has a 10.00 pack-year smoking history. She has never used smokeless tobacco. She reports that she drank alcohol. She reports that she does not use drugs.  She has smoked 1/2 pack/day x 20 years.  She stopped smoking in 2000.  She denies any exposure to radiation or toxins.  She is from [place].  She moved to the [place] 52 years ago after marrying her husband.  She was in the [employer].  The patient is accompanied by her husband today.  Allergies:  Allergies  Allergen Reactions  . Penicillins Other (See Comments)    Pass out Has patient had a PCN reaction causing immediate rash, facial/tongue/throat swelling, SOB or lightheadedness with hypotension: Yes Has patient had a PCN reaction  causing severe rash involving mucus membranes or skin necrosis: No Has patient had a PCN reaction that required hospitalization: No Has patient had a PCN reaction occurring within the last 10 years: No If all of the above answers are "NO", then may proceed with Cephalosporin use.   . Sulfa Antibiotics Nausea Only  . Sulfasalazine Nausea And Vomiting       . Ace Inhibitors Nausea Only  . Losartan Anxiety  . Spironolactone Other (See Comments)    Felt poorly but couldn't clarify     Current Medications: Current Outpatient Medications  Medication Sig Dispense Refill  . acetaminophen (TYLENOL) 500 MG tablet Take 1,000 mg by mouth 2 (two) times daily as needed for moderate pain or headache.    . alendronate (FOSAMAX) 70 MG tablet Take 70 mg by mouth every Saturday.     Marland Kitchen allopurinol (ZYLOPRIM) 100 MG tablet Take 0.5 tablets (50 mg total) by mouth daily. 15 tablet 0  . amLODipine (NORVASC) 5 MG tablet Take 5 mg by mouth daily.     . clotrimazole-betamethasone (LOTRISONE) cream Apply 1 application topically 2 (two) times daily as needed (rash).     . feeding supplement, ENSURE ENLIVE, (ENSURE ENLIVE) LIQD Take 237 mLs by mouth 2 (two) times daily between meals. (Patient taking differently: Take 237 mLs by mouth daily. ) 90 Bottle 0  . ferrous sulfate 325 (65 FE) MG tablet Take 1 tablet (325 mg total) by mouth 2 (two) times daily with a meal. 60 tablet 3  . fluticasone (FLONASE) 50 MCG/ACT nasal spray Place 1 spray into both nostrils daily as needed for allergies or rhinitis.    . hydroxychloroquine (PLAQUENIL) 200 MG tablet Take 200 mg by mouth 2 (two) times daily.     Marland Kitchen lidocaine-prilocaine (EMLA) cream Apply to affected area once 30 g 3  . Multiple Vitamin (MULTIVITAMIN WITH MINERALS) TABS tablet Take 1 tablet by mouth daily. 180 tablet 0  . ondansetron (ZOFRAN) 4 MG tablet Take 1 tablet (4 mg total) by mouth 2 (two) times daily as needed for refractory nausea / vomiting. Start on day 3  after cyclophosphamide chemotherapy. 20 tablet 1  . pantoprazole (PROTONIX) 40 MG tablet Take 1 tablet (40 mg total) by mouth 2 (two) times daily before a meal. 60 tablet 1  . polyethylene glycol (MIRALAX / GLYCOLAX) packet Take 17 g by mouth daily.    . predniSONE (DELTASONE) 20 MG tablet Take 3 tablets (60 mg total) by mouth daily. Take on days 1-5 of chemotherapy. 15 tablet 5  . predniSONE (DELTASONE) 5 MG tablet Take 5 mg by mouth.    . Pseudoephedrine-APAP 30-325 MG TABS Take 1 tablet by mouth daily as needed (sinuses).     . ranitidine (ZANTAC) 150 MG tablet Take 150 mg by mouth daily as needed for heartburn.     . simvastatin (ZOCOR) 40 MG tablet Take 40 mg by mouth daily.     . traMADol (ULTRAM) 50 MG tablet Take 50 mg by mouth 2 (two) times daily as needed for moderate pain.      No current facility-administered medications for this  visit.    Facility-Administered Medications Ordered in Other Visits  Medication Dose Route Frequency Provider Last Rate Last Dose  . heparin lock flush 100 unit/mL  500 Units Intravenous Once Lequita Asal, MD        Review of Systems  Constitutional: Negative for diaphoresis, fever, malaise/fatigue and weight loss (3 pound increase).       Feels "ok".  HENT: Negative.   Eyes: Negative.   Respiratory: Negative for cough, hemoptysis, sputum production and shortness of breath.   Cardiovascular: Negative for chest pain, palpitations, orthopnea, leg swelling and PND.  Gastrointestinal: Positive for constipation (little) and nausea (over weekend; well managed). Negative for abdominal pain, blood in stool, diarrhea, melena and vomiting.  Genitourinary: Negative for dysuria, frequency, hematuria and urgency.       PMH (+) for lupus nephritis. Good urine output.  Musculoskeletal: Negative for back pain, falls, joint pain and myalgias.  Skin: Negative for itching and rash.  Neurological: Negative for dizziness, tremors, weakness and headaches.   Endo/Heme/Allergies: Does not bruise/bleed easily.  Psychiatric/Behavioral: Negative for depression, memory loss and suicidal ideas. The patient is not nervous/anxious and does not have insomnia.   All other systems reviewed and are negative.  Performance status (ECOG): 1 - Symptomatic but completely ambulatory  Vital Signs: BP (!) 165/88 (BP Location: Left Arm, Patient Position: Sitting)   Pulse 90   Temp 97.9 F (36.6 C) (Tympanic)   Resp 18   Wt 105 lb (47.6 kg)   BMI 21.21 kg/m   Physical Exam  Constitutional: She is oriented to person, place, and time and well-developed, well-nourished, and in no distress.  HENT:  Head: Normocephalic and atraumatic.  Mouth/Throat: She has dentures.  Dark curly hair.  Eyes: Pupils are equal, round, and reactive to light. Conjunctivae and EOM are normal. No scleral icterus.  Glasses. Brown eyes.   Neck: Normal range of motion. Neck supple. No JVD present.  Cardiovascular: Normal rate, regular rhythm and normal heart sounds. Exam reveals no gallop and no friction rub.  No murmur heard. Pulmonary/Chest: Effort normal and breath sounds normal. No respiratory distress. She has no wheezes.  Abdominal: Soft. Bowel sounds are normal. She exhibits no distension and no mass. There is no tenderness. There is no rebound and no guarding.  Musculoskeletal: Normal range of motion. She exhibits no edema or tenderness.  Lymphadenopathy:    She has no cervical adenopathy.    She has no axillary adenopathy.       Right: No supraclavicular adenopathy present.       Left: No supraclavicular adenopathy present.  Neurological: She is alert and oriented to person, place, and time. Gait normal.  Skin: Skin is warm and dry. No rash noted. No erythema.  Psychiatric: Mood, affect and judgment normal.  Nursing note and vitals reviewed.   Infusion on 05/04/2018  Component Date Value Ref Range Status  . Magnesium 05/04/2018 2.1  1.7 - 2.4 mg/dL Final   Performed  at [facility], [address], [place], [place] [address]  . Sodium 05/04/2018 136  135 - 145 mmol/L Final  . Potassium 05/04/2018 4.0  3.5 - 5.1 mmol/L Final  . Chloride 05/04/2018 105  98 - 111 mmol/L Final  . CO2 05/04/2018 24  22 - 32 mmol/L Final  . Glucose, Bld 05/04/2018 105* 70 - 99 mg/dL Final  . BUN 05/04/2018 22  8 - 23 mg/dL Final  . Creatinine, Ser 05/04/2018 1.26* 0.44 - 1.00 mg/dL Final  .  Calcium 05/04/2018 8.6* 8.9 - 10.3 mg/dL Final  . Total Protein 05/04/2018 5.9* 6.5 - 8.1 g/dL Final  . Albumin 05/04/2018 3.7  3.5 - 5.0 g/dL Final  . AST 05/04/2018 38  15 - 41 U/L Final  . ALT 05/04/2018 22  0 - 44 U/L Final  . Alkaline Phosphatase 05/04/2018 90  38 - 126 U/L Final  . Total Bilirubin 05/04/2018 0.6  0.3 - 1.2 mg/dL Final  . GFR calc non Af Amer 05/04/2018 42* >60 mL/min Final  . GFR calc Af Amer 05/04/2018 49* >60 mL/min Final   Comment: (NOTE) The eGFR has been calculated using the CKD EPI equation. This calculation has not been validated in all clinical situations. eGFR's persistently <60 mL/min signify possible Chronic Kidney Disease.   Georgiann Hahn gap 05/04/2018 7  5 - 15 Final   Performed at [facility], [address] [place], [place] [address]    Assessment:  [name] is a 72 y.o. 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  IPI is 3 (intermediate-high risk), age-adjusted IPI is 2 (low-intermediate risk), revised IPI is 3 (poor), and NCCN-IPI is 5 (intermediate-high risk).  CNS risk is 3 (intermediate: age, LDH, stage).  EGD on 03/15/2018 revealed pathology from the duodenal scalloped lesion and duodenal sweep ulcer with involvement with diffuse large B cell lymphoma.  Random gastric biopsy was negative for lymphoma.  There was mild chronic gastritis with reactive foveolar hyperplasia.  Gastric fundus scar revealed involvement with diffuse large B cell lymphoma.  GE junction biopsy revealed  no lymphoma with squamocolumnar mucosa with mild chronic active inflammation.  The lymphoma has a non-germinal center immunophenotype with a Ki-67 proliferation index of 60% and shows Bcl-2/CMYC co-expression by  immunohistochemical analysis.   Summary of IHC results, neoplastic cells: Positive: PAX-5, CD20(variable), Bcl-2, MUM1, Bcl-6 (variable), CMYC.  Negative: CD10, CD5, CD30, CD138, Cyclin-D1, ALK-1, CD3.  EBV (ISH): Negative.  FISH for MYC gene rearrangement (8q24 gene region) was negative.  PET scan on 04/06/2018 revealed enlarged and hypermetabolic supraclavicular, mediastinal, abdominal and pelvic adenopathy. Additional smaller hypermetabolic lymph nodes identified within the left cervical lymph node chain. Largest lymph node was a 5.7 cm subcarinal lymph node (SUV 26.78).  Bone marrow aspirate and biopsy on 04/12/2018 revealed a slightly hypercellular bone marrow for age with trilineage hematopoiesis.  There were dyspoietic changes associated with the granulocyte series.  Flow cytometry revealed a predominance of T lymphocytes with relative abundance of CD8 + cells.  There was no significant B cell population.  Cytogenetics are pending.  Echo on 04/04/2018 revealed an EF of 55-60%.  She presented with a normocytic anemia.  She was admitted x 2 (12/2017 and 01/2018) with rectal bleeding.  She is intolerant of oral iron.  Diet is modest.  EGD on 12/30/2017 revealed non bleeding gastric erosions in the antrum.  Colonoscopy on 12/30/2017 revealed numerous polyps (one 5 mm in ascending colon, two 7 mm polyps in transverse colon, five 5-7 mm polyps in the descending colon, one 7 mm polyp in the sigmoid colon, one 8 mm polyp in the recto-sigmoid colon, and one 19 mm polyp in the rectum). 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    She developed hematochezia post colonoscopy.  Flexible sigmoidoscopy on 12/31/2017 revealed a rectal ulcer at the site of the  polypectomy and blood in the rectum.  Clips were placed.  EGD on 02/05/2018 revealed 2 bleeding AVMs (treated  with APC) in the small bowel with scalloped/denuded mucosa underneath the bleeding sites.  Work-up on 04/03/2018 revealed a hematocrit of 32.5, hemoglobin 10.7, MCV 91.7, platelets 207,000, WBC 1800 with an ANC of 600.  Uric acid was 8.8.  LDH was 241 (98-192) and beta2-microglobulin  5.8 (0.6-2.4).  Negative studies included:  hepatitis B core antibody total, hepatitis B surface antigen, hepatitis C antibody, HIV testing.     She has required 4 units of PRBCs (3 units in 12/2017 and 1 unit in 01/2018).  Ferritin has been followed: 30 on 12/29/2017, 93 on 02/13/2018, 57 on 03/07/2018, and 38 on 04/03/2018.  Labs on 12/29/2017 revealed the following:  B12 was 254 (low normal).  She has been on oral B12 since 03/07/2018.  B12 was 1524 on 04/03/2018.  Folate 6.3 (> 5.9) on 12/29/2017 and 55.8 on 04/03/2018.    She is day 11 s/p cycle #1 mini-RCHOP (04/24/2018) with Udencya support.  She has chronic renal insufficiency.  Creatinine was 1.42 (CrCl 25 ml/min) on 04/03/2018.  She has lupus (diagnosed 1982). She is on prednisone 5 mg a day and Plaquenil.   Symptomatically, she is doing well.  She has had no tumor lysis.  Nausea has been well managed.  She has a little constipation.  Exam is stable. Platelet count is 82,000.  Plan: 1. Labs today:  CBC with diff, CMP, Mg, uric acid.  2. DLBCL  Clinically doing well.  Tolerated cycle #1 chemotherapy.  No evidence of tumor lysis syndrome.  IVFs were helpful during 1st week.  Labs reviewed. Blood counts reveal an elevated WBC secondary to Udencya.  Platelet count is slightly low. Discuss symptom management.  Patient has antiemetics and pain medications at home to use on a PRN basis. Patient  advising that the prescribed interventions are adequate at this point. Continue all medications as previously prescribed.  3. Renal insufficiency  BUN  22 and creatinine 1.26 (CrCl 28 mL/min) today.   Lupus nephritis causing chronic renal insufficiency.   Patient off HCTZ.  Encouraged fluid intake.  No IVF needed today. 4. HYPERcalcemia  Calcium level has improved to 8.6.  No intervention needed. 5. Anemia and thrombocytopenia  Hemoglobin 9.2 and hematocrit 29.0  Multifactorial issue related to underlying chemotherapy, kidney disease, iron deficiency, and possibly Plaquenil.  Mild thrombocytopenia secondary to chemotherapy.  No intervention needed. 6. Leukopenia  WBC 18,200 (ANC 12,100) with Udencya support.   Bone marrow reveals dysplastic changes in granulocytic series. 7. TLS monitoring  Uric acid 5.3, potassium 4.0.  Continue to maintain adequate hydration and daily prophylactic allopurinol 50 mg/day.  8. B12 deficiency  B12 level normal on 04/03/2018 (has been on oral B12 since 03/07/2018).  Continue oral B12 1,000 mcg supplementation daily.  9. RTC on 05/15/2018 for MD assessment, labs (CBC with diff, CMP, Mg, LDH, uric acid), and cycle #2 mini-RCHOP with Udenyca support.   Honor Loh, NP  05/04/2018, 10:15 AM   I saw and evaluated the patient, participating in the key portions of the service and reviewing pertinent diagnostic studies and records.  I reviewed the nurse practitioner's note and agree with the findings and the plan.  The assessment and plan were discussed with the patient.  Extensive questions were asked by the patient and answered.   Nolon Stalls, MD 05/04/2018,10:15 AM

## 2018-05-04 NOTE — Progress Notes (Signed)
Patient offers no complaints today. 

## 2018-05-10 ENCOUNTER — Other Ambulatory Visit: Payer: Self-pay | Admitting: Urgent Care

## 2018-05-12 ENCOUNTER — Other Ambulatory Visit: Payer: Self-pay | Admitting: *Deleted

## 2018-05-12 DIAGNOSIS — C8339 Diffuse large B-cell lymphoma, extranodal and solid organ sites: Secondary | ICD-10-CM

## 2018-05-14 DIAGNOSIS — D696 Thrombocytopenia, unspecified: Secondary | ICD-10-CM | POA: Insufficient documentation

## 2018-05-14 NOTE — Progress Notes (Signed)
Judy Clinic day:  05/15/2018  Chief Complaint: [name] is a 72 y.o. 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  HPI: The Judy was last seen in the medical oncology clinic on 05/04/2018.  At that Torres, Judy Torres.  Judy noted some minor nausea and constipation.  No neuropathy.  Appetite stable; weight up 3 pounds.  Making conscious effort to increase fluid intake; "I am drinking 3 big bottles of water a day".  Exam is grossly unremarkable.  Platelets 82,000.  Judy with plans to travel to [facility] over the weekend with her husband.  In the interim, Judy is doing well. She denies any acute concerns today. She has new chemotherapy induced alopecia. Judy denies that she has experienced any B symptoms. She denies any interval infections. She has not appreciated any new areas of palpable adenopathy.   Judy advises that she maintains an adequate appetite. She is eating and drinking well. Weight today is 107 lb 11.2 oz (48.9 kg), which compared to her last visit to the clinic, represents a 2 pound increase.   Judy denies pain in the clinic today.   Past Medical History:  Diagnosis Date  . Cancer [facility])    Stomach cancer  . Chronic kidney disease   . GERD (gastroesophageal reflux disease)   . HTN (hypertension)   . Hyperlipidemia   . Lupus ([place])   . S/P angioplasty with stent     Past Surgical History:  Procedure Laterality Date  . CARDIAC SURGERY    . CESAREAN SECTION    . COLONOSCOPY N/A 12/30/2017   Procedure: COLONOSCOPY;  Surgeon: Lin Landsman, MD;  Location: [facility] ENDOSCOPY;  Service: Gastroenterology;  Laterality: N/A;  . CORONARY ARTERY BYPASS GRAFT  2003  . ESOPHAGOGASTRODUODENOSCOPY N/A 12/30/2017   Procedure: ESOPHAGOGASTRODUODENOSCOPY (EGD);   Surgeon: Lin Landsman, MD;  Location: [facility] ENDOSCOPY;  Service: Gastroenterology;  Laterality: N/A;  . ESOPHAGOGASTRODUODENOSCOPY Left 02/05/2018   Procedure: ESOPHAGOGASTRODUODENOSCOPY (EGD);  Surgeon: Virgel Manifold, MD;  Location: [facility] ENDOSCOPY;  Service: Endoscopy;  Laterality: Left;  . ESOPHAGOGASTRODUODENOSCOPY (EGD) WITH PROPOFOL N/A 03/15/2018   Procedure: ESOPHAGOGASTRODUODENOSCOPY (EGD) WITH PROPOFOL;  Surgeon: Virgel Manifold, MD;  Location: [facility] ENDOSCOPY;  Service: Endoscopy;  Laterality: N/A;  . FLEXIBLE SIGMOIDOSCOPY N/A 12/31/2017   Procedure: FLEXIBLE SIGMOIDOSCOPY;  Surgeon: Jonathon Bellows, MD;  Location: [facility] ENDOSCOPY;  Service: Gastroenterology;  Laterality: N/A;  . PORTA CATH INSERTION N/A 04/19/2018   Procedure: PORTA CATH INSERTION;  Surgeon: Algernon Huxley, MD;  Location: [place] CV LAB;  Service: Cardiovascular;  Laterality: N/A;    Family History  Problem Relation Age of Onset  . Hypertension Mother   . Stroke Mother   . Vascular Disease Mother   . Hypertension Sister     Social History:  reports that she quit smoking about 16 years ago. Her smoking use included cigarettes. She has a 10.00 pack-year smoking history. She has never used smokeless tobacco. She reports that she drank alcohol. She reports that she does not use drugs.  She has smoked 1/2 pack/day x 20 years.  She stopped smoking in 2000.  She denies any exposure to radiation or toxins.  She is from [place].  She moved to the [place] 52 years ago after marrying her husband.  She was in the  army.  The Judy is accompanied by her husband today.  Allergies:  Allergies  Allergen Reactions  . Penicillins Other (See Comments)    Pass out Has Judy had a PCN reaction causing immediate rash, facial/tongue/throat swelling, SOB or lightheadedness with hypotension: Yes Has Judy had a PCN reaction causing severe rash involving mucus membranes or skin necrosis: No Has Judy had a PCN  reaction that required hospitalization: No Has Judy had a PCN reaction occurring within the last 10 years: No If all of the above answers are "NO", then may proceed with Cephalosporin use.   . Sulfa Antibiotics Nausea Only  . Sulfasalazine Nausea And Vomiting       . Ace Inhibitors Nausea Only  . Losartan Anxiety  . Spironolactone Other (See Comments)    Felt poorly but couldn't clarify     Current Medications: Current Outpatient Medications  Medication Sig Dispense Refill  . alendronate (FOSAMAX) 70 MG tablet Take 70 mg by mouth every Saturday.     Marland Kitchen allopurinol (ZYLOPRIM) 100 MG tablet TAKE 1/2 TABLET BY MOUTH DAILY 15 tablet 3  . amLODipine (NORVASC) 5 MG tablet Take 5 mg by mouth daily.     . feeding supplement, ENSURE ENLIVE, (ENSURE ENLIVE) LIQD Take 237 mLs by mouth 2 (two) times daily between meals. (Judy taking differently: Take 237 mLs by mouth daily. ) 90 Bottle 0  . ferrous sulfate 325 (65 FE) MG tablet Take 1 tablet (325 mg total) by mouth 2 (two) times daily with a meal. 60 tablet 3  . fluticasone (FLONASE) 50 MCG/ACT nasal spray Place 1 spray into both nostrils daily as needed for allergies or rhinitis.    . hydroxychloroquine (PLAQUENIL) 200 MG tablet Take 200 mg by mouth 2 (two) times daily.     . Multiple Vitamin (MULTIVITAMIN WITH MINERALS) TABS tablet Take 1 tablet by mouth daily. 180 tablet 0  . pantoprazole (PROTONIX) 40 MG tablet Take 1 tablet (40 mg total) by mouth 2 (two) times daily before a meal. 60 tablet 1  . predniSONE (DELTASONE) 5 MG tablet Take 5 mg by mouth.    . Pseudoephedrine-APAP 30-325 MG TABS Take 1 tablet by mouth daily as needed (sinuses).     . simvastatin (ZOCOR) 40 MG tablet Take 40 mg by mouth daily.     Marland Kitchen acetaminophen (TYLENOL) 500 MG tablet Take 1,000 mg by mouth 2 (two) times daily as needed for moderate pain or headache.    . clotrimazole-betamethasone (LOTRISONE) cream Apply 1 application topically 2 (two) times daily as needed  (rash).     Marland Kitchen lidocaine-prilocaine (EMLA) cream Apply to affected area once (Judy not taking: Reported on 05/15/2018) 30 g 3  . ondansetron (ZOFRAN) 4 MG tablet Take 1 tablet (4 mg total) by mouth 2 (two) times daily as needed for refractory nausea / vomiting. Start on day 3 after cyclophosphamide chemotherapy. (Judy not taking: Reported on 05/15/2018) 20 tablet 1  . polyethylene glycol (MIRALAX / GLYCOLAX) packet Take 17 g by mouth daily.    . predniSONE (DELTASONE) 20 MG tablet Take 3 tablets (60 mg total) by mouth daily. Take on days 1-5 of chemotherapy. (Judy not taking: Reported on 05/15/2018) 15 tablet 5  . ranitidine (ZANTAC) 150 MG tablet Take 150 mg by mouth daily as needed for heartburn.     . traMADol (ULTRAM) 50 MG tablet Take 50 mg by mouth 2 (two) times daily as needed for moderate pain.      No current facility-administered medications  for this visit.    Facility-Administered Medications Ordered in Other Visits  Medication Dose Route Frequency Provider Last Rate Last Dose  . heparin lock flush 100 unit/mL  500 Units Intravenous Once Corcoran, Melissa C, MD      . sodium chloride flush (NS) 0.9 % injection 10 mL  10 mL Intravenous PRN Lequita Asal, MD   10 mL at 05/15/18 8786    Review of Systems  Constitutional: Negative for diaphoresis, fever, malaise/fatigue and weight loss (up 2 pounds).  HENT: Negative.   Eyes: Negative.   Respiratory: Negative for cough, hemoptysis, sputum production and shortness of breath.   Cardiovascular: Negative for chest pain, palpitations, orthopnea, leg swelling and PND.  Gastrointestinal: Positive for constipation (mild) and nausea (mild). Negative for abdominal pain, blood in stool, diarrhea, melena and vomiting.  Genitourinary: Negative for dysuria, frequency, hematuria and urgency.       PMH (+) for lupus nephritis. Good urine output.   Musculoskeletal: Negative for back pain, falls, joint pain and myalgias.  Skin: Negative  for itching and rash.  Neurological: Negative for dizziness, tremors, weakness and headaches.  Endo/Heme/Allergies: Does not bruise/bleed easily.  Psychiatric/Behavioral: Negative for depression, memory loss and suicidal ideas. The Judy is not nervous/anxious and does not have insomnia.   All other systems reviewed and are negative.  Performance status (ECOG): 1 - Symptomatic but completely ambulatory  Vital Signs: BP (!) 151/90 (BP Location: Left Arm, Judy Position: Sitting)   Pulse 90   Temp 98 F (36.7 C) (Tympanic)   Resp 18   Ht '4\' 11"'$  (1.499 m)   Wt 107 lb 11.2 oz (48.9 kg)   SpO2 98%   BMI 21.75 kg/m   Physical Exam  Constitutional: She is oriented to person, place, and Torres and well-developed, well-nourished, and in no distress.  HENT:  Head: Normocephalic and atraumatic. 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  Mouth/Throat: Oropharynx is clear and moist and mucous membranes are normal. She has dentures.  Eyes: Pupils are equal, round, and reactive to light. EOM are normal. No scleral icterus.  Glasses.  Brown eyes.  Neck: Normal range of motion. Neck supple. No tracheal deviation present. No thyromegaly present.  Cardiovascular: Normal rate, regular rhythm, normal heart sounds and intact distal pulses. Exam reveals no gallop and no friction rub.  No murmur heard. Pulmonary/Chest: Effort normal and breath sounds normal. No respiratory distress. She has no wheezes. She has no rales.  Abdominal: Soft. Bowel sounds are normal. She exhibits no distension. There is no tenderness.  Musculoskeletal: Normal range of motion. She exhibits no edema or tenderness.  Lymphadenopathy:    She has no cervical adenopathy.    She has no axillary adenopathy.       Right: No inguinal and no supraclavicular adenopathy present.       Left: No inguinal and no supraclavicular adenopathy present.  Neurological: She is alert and oriented to person, place, and Torres.  Reflex  Scores:      Patellar reflexes are 2+ on the right side and 2+ on the left side. No foot drop.  Skin: Skin is warm and dry. No rash noted. No erythema.  Psychiatric: Mood, affect and judgment normal.  Nursing note and vitals reviewed.   Infusion on 05/15/2018  Component Date Value Ref Range Status  . LDH 05/15/2018 211* 98 - 192 U/L Final   Performed at [facility], [address] [place], [place] [address]  . Magnesium 05/15/2018 2.1  1.7 -  2.4 mg/dL Final   Performed at [facility], [address], [place], [place] [address]  . Sodium 05/15/2018 138  135 - 145 mmol/L Final  . Potassium 05/15/2018 3.6  3.5 - 5.1 mmol/L Final  . Chloride 05/15/2018 109  98 - 111 mmol/L Final  . CO2 05/15/2018 23  22 - 32 mmol/L Final  . Glucose, Bld 05/15/2018 117* 70 - 99 mg/dL Final  . BUN 05/15/2018 21  8 - 23 mg/dL Final  . Creatinine, Ser 05/15/2018 0.96  0.44 - 1.00 mg/dL Final  . Calcium 05/15/2018 8.7* 8.9 - 10.3 mg/dL Final  . Total Protein 05/15/2018 5.8* 6.5 - 8.1 g/dL Final  . Albumin 05/15/2018 3.4* 3.5 - 5.0 g/dL Final  . AST 05/15/2018 34  15 - 41 U/L Final  . ALT 05/15/2018 17  0 - 44 U/L Final  . Alkaline Phosphatase 05/15/2018 66  38 - 126 U/L Final  . Total Bilirubin 05/15/2018 0.4  0.3 - 1.2 mg/dL Final  . GFR calc non Af Amer 05/15/2018 59* >60 mL/min Final  . GFR calc Af Amer 05/15/2018 >60  >60 mL/min Final   Comment: (NOTE) The eGFR has been calculated using the CKD EPI equation. This calculation has not been validated in all clinical situations. eGFR's persistently <60 mL/min signify possible Chronic Kidney Disease.   Georgiann Hahn gap 05/15/2018 6  5 - 15 Final   Performed at [facility], [address] [place], [place] [address]  . WBC 05/15/2018 7.8  4.0 - 10.5 K/uL Final  . RBC 05/15/2018 3.07* 3.87 - 5.11 MIL/uL Final  . Hemoglobin 05/15/2018 9.1* 12.0 - 15.0 g/dL Final  . HCT 05/15/2018 29.4* 36.0 - 46.0 % Final  . MCV 05/15/2018 95.8  80.0  - 100.0 fL Final  . MCH 05/15/2018 29.6  26.0 - 34.0 pg Final  . MCHC 05/15/2018 31.0  30.0 - 36.0 g/dL Final  . RDW 05/15/2018 16.5* 11.5 - 15.5 % Final  . Platelets 05/15/2018 187  150 - 400 K/uL Final  . nRBC 05/15/2018 0.0  0.0 - 0.2 % Final  . Neutrophils Relative % 05/15/2018 70  % Final  . Neutro Abs 05/15/2018 5.5  1.7 - 7.7 K/uL Final  . Lymphocytes Relative 05/15/2018 15  % Final  . Lymphs Abs 05/15/2018 1.2  0.7 - 4.0 K/uL Final  . Monocytes Relative 05/15/2018 12  % Final  . Monocytes Absolute 05/15/2018 0.9  0.1 - 1.0 K/uL Final  . Eosinophils Relative 05/15/2018 1  % Final  . Eosinophils Absolute 05/15/2018 0.1  0.0 - 0.5 K/uL Final  . Basophils Relative 05/15/2018 1  % Final  . Basophils Absolute 05/15/2018 0.1  0.0 - 0.1 K/uL Final  . Immature Granulocytes 05/15/2018 1  % Final  . Abs Immature Granulocytes 05/15/2018 0.04  0.00 - 0.07 K/uL Final   Performed at [facility], [address] [place], [place] [address]    Assessment:  In Arvetta Araque is a 72 y.o. 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  IPI is 3 (intermediate-high risk), age-adjusted IPI is 2 (low-intermediate risk), revised IPI is 3 (poor), and NCCN-IPI is 5 (intermediate-high risk).  CNS risk is 3 (intermediate: age, LDH, stage).  EGD on 03/15/2018 revealed pathology from the duodenal scalloped lesion and duodenal sweep ulcer with involvement with diffuse large B cell lymphoma.  Random gastric biopsy was negative for lymphoma.  There was mild chronic gastritis  with reactive foveolar hyperplasia.  Gastric fundus scar revealed involvement with diffuse large B cell lymphoma.  GE junction biopsy revealed no lymphoma with squamocolumnar mucosa with mild chronic active inflammation.  The lymphoma has a non-germinal center immunophenotype with a Ki-67 proliferation index of 60% and shows Bcl-2/CMYC co-expression by  immunohistochemical analysis.   Summary  of IHC results, neoplastic cells: Positive: PAX-5, CD20(variable), Bcl-2, MUM1, Bcl-6 (variable), CMYC.  Negative: CD10, CD5, CD30, CD138, Cyclin-D1, ALK-1, CD3.  EBV (ISH): Negative.  FISH for MYC gene rearrangement (8q24 gene region) was negative.  PET scan on 04/06/2018 revealed enlarged and hypermetabolic supraclavicular, mediastinal, abdominal and pelvic adenopathy. Additional smaller hypermetabolic lymph nodes identified within the left cervical lymph node chain. Largest lymph node was a 5.7 cm subcarinal lymph node (SUV 26.78).  Bone marrow aspirate and biopsy on 04/12/2018 revealed a slightly hypercellular bone marrow for age with trilineage hematopoiesis.  There were dyspoietic changes associated with the granulocyte series.  Flow cytometry revealed a predominance of T lymphocytes with relative abundance of CD8 + cells.  There was no significant B cell population.  Cytogenetics are pending.  Echo on 04/04/2018 revealed an EF of 55-60%.  She presented with a normocytic anemia.  She was admitted x 2 (12/2017 and 01/2018) with rectal bleeding.  She is intolerant of oral iron.  Diet is modest.  EGD on 12/30/2017 revealed non bleeding gastric erosions in the antrum.  Colonoscopy on 12/30/2017 revealed numerous polyps (one 5 mm in ascending colon, two 7 mm polyps in transverse colon, five 5-7 mm polyps in the descending colon, one 7 mm polyp in the sigmoid colon, one 8 mm polyp in the recto-sigmoid colon, and one 19 mm polyp in the rectum). 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    She developed hematochezia post colonoscopy.  Flexible sigmoidoscopy on 12/31/2017 revealed a rectal ulcer at the site of the polypectomy and blood in the rectum.  Clips were placed.  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  Work-up on 04/03/2018 revealed a hematocrit of 32.5,  hemoglobin 10.7, MCV 91.7, platelets 207,000, WBC 1800 with an ANC of 600.  Uric acid was 8.8.  LDH was 241 (98-192) and beta2-microglobulin  5.8 (0.6-2.4).  Negative studies included:  hepatitis B core antibody total, hepatitis B surface antigen, hepatitis C antibody, HIV testing.     She has required 4 units of PRBCs (3 units in 12/2017 and 1 unit in 01/2018).  Ferritin has been followed: 30 on 12/29/2017, 93 on 02/13/2018, 57 on 03/07/2018, and 38 on 04/03/2018.  Labs on 12/29/2017 revealed the following:  B12 was 254 (low normal).  She has been on oral B12 since 03/07/2018.  B12 was 1524 on 04/03/2018.  Folate 6.3 (> 5.9) on 12/29/2017 and 55.8 on 04/03/2018.    She is s/p cycle #1 mini-RCHOP (04/24/2018) with Udencya support.  Cycle #1 platelet nadir was 82,000.  She has chronic renal insufficiency.  Creatinine was 1.42 (CrCl 25 ml/min) on 04/03/2018.  She has lupus (diagnosed 1982). She is on prednisone 5 mg a day and Plaquenil.   Symptomatically, Judy is doing well. She denies any acute complaints. Pain and nausea is well managed. No significant B symptoms or interval infections. Eating well; weight up 2 pounds.   Plan: 1. Labs today: CBC with differential, CMP, Mg, LDH, uric acid 2. DLBCL Doing well overall. Tolerating treatments with minimal side Torres. Labs  reviewed. Blood counts stable and adequate enough for treatment. Will proceed with cycle #2 mini R-CHOP with Udenyca support. Discuss nadir platelet count (82,000) with cycle #1.  Discuss thoughts about slowly advancing dose.  Judy's husband wishes to maintain current dose.  Readdress with next cycle. Discuss symptom management.  Judy has antiemetics and pain medications at home to use on a PRN basis. Judy  advising that the  prescribed interventions are adequate at this point. Continue all medications as previously prescribed.  3. Renal insufficiency  BUN 21 and creatinine 0.96 (CrCl 37.2 mL/min) today.  Lupus  nephritis causing chronic renal insufficiency  Judy off of thiazide diuretics.  She is being followed by local nephrologist.  Encouraged oral fluid intake. 4. Hypercalcemia  Calcium level 8.7.  No intervention needed. 5. Pancytopenia  WBC 7800 ([place] 5500, ALC 1200).   Hemoglobin 9.1, hematocrit 29.4, MCV 95.8, and platelets 187,000.  Multifactorial issue related to underlying malignancy, chemotherapy, kidney disease, iron deficiency and possibly Plaquenil.  Mild thrombocytopenia secondary to chemotherapy.  Continue routine lab monitoring. 6. TLS monitoring  LDH 211, uric acid 7.0, K+ 3.6  No evidence of TLS.  Encouraged to maintain adequate hydration.  Judy continues on prophylactic allopurinol 50 mg a day. 7. B12 deficiency  B12 level normal on 04/03/2018 (on oral B12 since 03/07/2018).  Continue oral B12 1000 mcg supplementation daily. 8. RTC daily for 500 cc 0.9% NS daily this week.  9. RTC on 05/26/2018 for labs (CBC with diff, BMP) 10. RTC on 06/05/2018 for MD assessment, labs (CBC with diff, CMP, Mg, LDH, uric acid), and cycle #3 mini-RCHOP with Udenyca support.   Honor Loh, NP  05/15/2018, 11:19 AM   I saw and evaluated the Judy, participating in the key portions of the service and reviewing pertinent diagnostic studies and records.  I reviewed the nurse practitioner's note and agree with the findings and the plan.  The assessment and plan were discussed with the Judy.  Extensive questions were asked by the Judy and answered.   Nolon Stalls, MD 05/15/2018,11:19 AM

## 2018-05-15 ENCOUNTER — Inpatient Hospital Stay: Payer: Medicare Other

## 2018-05-15 ENCOUNTER — Inpatient Hospital Stay (HOSPITAL_BASED_OUTPATIENT_CLINIC_OR_DEPARTMENT_OTHER): Payer: Medicare Other | Admitting: Hematology and Oncology

## 2018-05-15 ENCOUNTER — Encounter: Payer: Self-pay | Admitting: Hematology and Oncology

## 2018-05-15 VITALS — BP 151/90 | HR 90 | Temp 98.0°F | Resp 18 | Ht 59.0 in | Wt 107.7 lb

## 2018-05-15 DIAGNOSIS — N189 Chronic kidney disease, unspecified: Secondary | ICD-10-CM

## 2018-05-15 DIAGNOSIS — E538 Deficiency of other specified B group vitamins: Secondary | ICD-10-CM

## 2018-05-15 DIAGNOSIS — R110 Nausea: Secondary | ICD-10-CM

## 2018-05-15 DIAGNOSIS — K5900 Constipation, unspecified: Secondary | ICD-10-CM

## 2018-05-15 DIAGNOSIS — C8339 Diffuse large B-cell lymphoma, extranodal and solid organ sites: Secondary | ICD-10-CM

## 2018-05-15 DIAGNOSIS — Z5111 Encounter for antineoplastic chemotherapy: Secondary | ICD-10-CM

## 2018-05-15 DIAGNOSIS — I129 Hypertensive chronic kidney disease with stage 1 through stage 4 chronic kidney disease, or unspecified chronic kidney disease: Secondary | ICD-10-CM

## 2018-05-15 DIAGNOSIS — Z79899 Other long term (current) drug therapy: Secondary | ICD-10-CM

## 2018-05-15 DIAGNOSIS — Z5112 Encounter for antineoplastic immunotherapy: Secondary | ICD-10-CM

## 2018-05-15 DIAGNOSIS — D61818 Other pancytopenia: Secondary | ICD-10-CM

## 2018-05-15 DIAGNOSIS — M3214 Glomerular disease in systemic lupus erythematosus: Secondary | ICD-10-CM

## 2018-05-15 LAB — COMPREHENSIVE METABOLIC PANEL
ALT: 17 U/L (ref 0–44)
AST: 34 U/L (ref 15–41)
Albumin: 3.4 g/dL — ABNORMAL LOW (ref 3.5–5.0)
Alkaline Phosphatase: 66 U/L (ref 38–126)
Anion gap: 6 (ref 5–15)
BUN: 21 mg/dL (ref 8–23)
CO2: 23 mmol/L (ref 22–32)
Calcium: 8.7 mg/dL — ABNORMAL LOW (ref 8.9–10.3)
Chloride: 109 mmol/L (ref 98–111)
Creatinine, Ser: 0.96 mg/dL (ref 0.44–1.00)
GFR calc Af Amer: >60 mL/min (ref >60)
GFR calc non Af Amer: 59 mL/min — ABNORMAL LOW (ref >60)
Glucose, Bld: 117 mg/dL — ABNORMAL HIGH (ref 70–99)
Potassium: 3.6 mmol/L (ref 3.5–5.1)
Sodium: 138 mmol/L (ref 135–145)
Total Bilirubin: 0.4 mg/dL (ref 0.3–1.2)
Total Protein: 5.8 g/dL — ABNORMAL LOW (ref 6.5–8.1)

## 2018-05-15 LAB — CBC WITH DIFFERENTIAL/PLATELET
Abs Immature Granulocytes: 0.04 10*3/uL (ref 0.00–0.07)
Basophils Absolute: 0.1 10*3/uL (ref 0.0–0.1)
Basophils Relative: 1 %
Eosinophils Absolute: 0.1 10*3/uL (ref 0.0–0.5)
Eosinophils Relative: 1 %
HCT: 29.4 % — ABNORMAL LOW (ref 36.0–46.0)
Hemoglobin: 9.1 g/dL — ABNORMAL LOW (ref 12.0–15.0)
Immature Granulocytes: 1 %
Lymphocytes Relative: 15 %
Lymphs Abs: 1.2 10*3/uL (ref 0.7–4.0)
MCH: 29.6 pg (ref 26.0–34.0)
MCHC: 31 g/dL (ref 30.0–36.0)
MCV: 95.8 fL (ref 80.0–100.0)
Monocytes Absolute: 0.9 10*3/uL (ref 0.1–1.0)
Monocytes Relative: 12 %
Neutro Abs: 5.5 10*3/uL (ref 1.7–7.7)
Neutrophils Relative %: 70 %
Platelets: 187 10*3/uL (ref 150–400)
RBC: 3.07 MIL/uL — ABNORMAL LOW (ref 3.87–5.11)
RDW: 16.5 % — ABNORMAL HIGH (ref 11.5–15.5)
WBC: 7.8 10*3/uL (ref 4.0–10.5)
nRBC: 0 % (ref 0.0–0.2)

## 2018-05-15 LAB — URIC ACID: Uric Acid, Serum: 7 mg/dL (ref 2.5–7.1)

## 2018-05-15 LAB — MAGNESIUM: Magnesium: 2.1 mg/dL (ref 1.7–2.4)

## 2018-05-15 LAB — LACTATE DEHYDROGENASE: LDH: 211 U/L — AB (ref 98–192)

## 2018-05-15 MED ORDER — SODIUM CHLORIDE 0.9 % IV SOLN: 375.0000 mg/m2 | Freq: Once | INTRAVENOUS | Status: DC

## 2018-05-15 MED ORDER — DIPHENHYDRAMINE HCL 25 MG PO CAPS
50.0000 mg | ORAL_CAPSULE | Freq: Once | ORAL | Status: AC
  Administered 2018-05-15: 50 mg via ORAL
  Filled 2018-05-15: qty 2

## 2018-05-15 MED ORDER — SODIUM CHLORIDE 0.9 % IV SOLN
375.0000 mg/m2 | Freq: Once | INTRAVENOUS | Status: AC
  Administered 2018-05-15: 500 mg via INTRAVENOUS
  Filled 2018-05-15: qty 50

## 2018-05-15 MED ORDER — PALONOSETRON HCL INJECTION 0.25 MG/5ML
0.2500 mg | Freq: Once | INTRAVENOUS | Status: AC
  Administered 2018-05-15: 0.25 mg via INTRAVENOUS
  Filled 2018-05-15: qty 5

## 2018-05-15 MED ORDER — VINCRISTINE SULFATE CHEMO INJECTION 1 MG/ML
1.0000 mg | Freq: Once | INTRAVENOUS | Status: AC
  Administered 2018-05-15: 1 mg via INTRAVENOUS
  Filled 2018-05-15: qty 1

## 2018-05-15 MED ORDER — SODIUM CHLORIDE 0.9% FLUSH
10.0000 mL | INTRAVENOUS | Status: DC | PRN
  Administered 2018-05-15: 10 mL via INTRAVENOUS
  Filled 2018-05-15: qty 10

## 2018-05-15 MED ORDER — SODIUM CHLORIDE 0.9 % IV SOLN
Freq: Once | INTRAVENOUS | Status: AC
  Administered 2018-05-15: 12:00:00 via INTRAVENOUS
  Filled 2018-05-15: qty 250

## 2018-05-15 MED ORDER — DEXAMETHASONE SODIUM PHOSPHATE 10 MG/ML IJ SOLN
10.0000 mg | Freq: Once | INTRAMUSCULAR | Status: AC
  Administered 2018-05-15: 10 mg via INTRAVENOUS
  Filled 2018-05-15: qty 1

## 2018-05-15 MED ORDER — ACETAMINOPHEN 325 MG PO TABS
650.0000 mg | ORAL_TABLET | Freq: Once | ORAL | Status: AC
  Administered 2018-05-15: 650 mg via ORAL
  Filled 2018-05-15: qty 2

## 2018-05-15 MED ORDER — SODIUM CHLORIDE 0.9 % IV SOLN
400.0000 mg/m2 | Freq: Once | INTRAVENOUS | Status: AC
  Administered 2018-05-15: 560 mg via INTRAVENOUS
  Filled 2018-05-15: qty 28

## 2018-05-15 MED ORDER — SODIUM CHLORIDE 0.9 % IV SOLN: 10.0000 mg | Freq: Once | INTRAVENOUS | Status: DC

## 2018-05-15 MED ORDER — DOXORUBICIN HCL CHEMO IV INJECTION 2 MG/ML
25.0000 mg/m2 | Freq: Once | INTRAVENOUS | Status: AC
  Administered 2018-05-15: 34 mg via INTRAVENOUS
  Filled 2018-05-15: qty 17

## 2018-05-15 MED ORDER — HEPARIN SOD (PORK) LOCK FLUSH 100 UNIT/ML IV SOLN
500.0000 [IU] | Freq: Once | INTRAVENOUS | Status: AC
  Administered 2018-05-15: 500 [IU] via INTRAVENOUS
  Filled 2018-05-15: qty 5

## 2018-05-15 NOTE — Progress Notes (Signed)
No new changes noted todayt

## 2018-05-16 ENCOUNTER — Inpatient Hospital Stay: Payer: Medicare Other

## 2018-05-16 VITALS — BP 120/70 | HR 73 | Temp 97.7°F | Resp 20

## 2018-05-16 DIAGNOSIS — D500 Iron deficiency anemia secondary to blood loss (chronic): Secondary | ICD-10-CM

## 2018-05-16 DIAGNOSIS — C8339 Diffuse large B-cell lymphoma, extranodal and solid organ sites: Secondary | ICD-10-CM | POA: Diagnosis not present

## 2018-05-16 MED ORDER — HEPARIN SOD (PORK) LOCK FLUSH 100 UNIT/ML IV SOLN
500.0000 [IU] | Freq: Once | INTRAVENOUS | Status: AC
  Administered 2018-05-16: 500 [IU] via INTRAVENOUS
  Filled 2018-05-16: qty 5

## 2018-05-16 MED ORDER — PEGFILGRASTIM-CBQV 6 MG/0.6ML ~~LOC~~ SOSY
6.0000 mg | PREFILLED_SYRINGE | Freq: Once | SUBCUTANEOUS | Status: AC
  Administered 2018-05-16: 6 mg via SUBCUTANEOUS
  Filled 2018-05-16: qty 0.6

## 2018-05-16 MED ORDER — SODIUM CHLORIDE 0.9 % IV SOLN
Freq: Once | INTRAVENOUS | Status: AC
  Administered 2018-05-16: 15:00:00 via INTRAVENOUS
  Filled 2018-05-16: qty 250

## 2018-05-16 MED ORDER — SODIUM CHLORIDE 0.9% FLUSH
10.0000 mL | Freq: Once | INTRAVENOUS | Status: AC
  Administered 2018-05-16: 10 mL via INTRAVENOUS
  Filled 2018-05-16: qty 10

## 2018-05-17 ENCOUNTER — Inpatient Hospital Stay: Payer: Medicare Other

## 2018-05-17 VITALS — BP 122/73 | HR 78 | Temp 97.8°F | Resp 20

## 2018-05-17 DIAGNOSIS — D500 Iron deficiency anemia secondary to blood loss (chronic): Secondary | ICD-10-CM

## 2018-05-17 DIAGNOSIS — C8339 Diffuse large B-cell lymphoma, extranodal and solid organ sites: Secondary | ICD-10-CM | POA: Diagnosis not present

## 2018-05-17 MED ORDER — SODIUM CHLORIDE 0.9 % IV SOLN
Freq: Once | INTRAVENOUS | Status: AC
  Administered 2018-05-17: 15:00:00 via INTRAVENOUS
  Filled 2018-05-17: qty 250

## 2018-05-17 MED ORDER — HEPARIN SOD (PORK) LOCK FLUSH 100 UNIT/ML IV SOLN
INTRAVENOUS | Status: AC
  Filled 2018-05-17: qty 5

## 2018-05-17 MED ORDER — HEPARIN SOD (PORK) LOCK FLUSH 100 UNIT/ML IV SOLN
500.0000 [IU] | Freq: Once | INTRAVENOUS | Status: AC
  Administered 2018-05-17: 500 [IU] via INTRAVENOUS

## 2018-05-18 ENCOUNTER — Inpatient Hospital Stay: Payer: Medicare Other

## 2018-05-18 VITALS — BP 103/66 | HR 92 | Temp 96.2°F | Resp 18

## 2018-05-18 DIAGNOSIS — C8339 Diffuse large B-cell lymphoma, extranodal and solid organ sites: Secondary | ICD-10-CM | POA: Diagnosis not present

## 2018-05-18 DIAGNOSIS — D500 Iron deficiency anemia secondary to blood loss (chronic): Secondary | ICD-10-CM

## 2018-05-18 MED ORDER — SODIUM CHLORIDE 0.9 % IV SOLN
Freq: Once | INTRAVENOUS | Status: AC
  Administered 2018-05-18: 15:00:00 via INTRAVENOUS
  Filled 2018-05-18: qty 250

## 2018-05-18 MED ORDER — HEPARIN SOD (PORK) LOCK FLUSH 100 UNIT/ML IV SOLN
500.0000 [IU] | Freq: Once | INTRAVENOUS | Status: AC
  Administered 2018-05-18: 500 [IU] via INTRAVENOUS

## 2018-05-19 ENCOUNTER — Inpatient Hospital Stay: Payer: Medicare Other | Attending: Hematology and Oncology

## 2018-05-19 VITALS — BP 125/70 | HR 79 | Resp 18

## 2018-05-19 DIAGNOSIS — N189 Chronic kidney disease, unspecified: Secondary | ICD-10-CM | POA: Diagnosis not present

## 2018-05-19 DIAGNOSIS — Z79899 Other long term (current) drug therapy: Secondary | ICD-10-CM | POA: Diagnosis not present

## 2018-05-19 DIAGNOSIS — Z951 Presence of aortocoronary bypass graft: Secondary | ICD-10-CM | POA: Diagnosis not present

## 2018-05-19 DIAGNOSIS — M329 Systemic lupus erythematosus, unspecified: Secondary | ICD-10-CM | POA: Diagnosis not present

## 2018-05-19 DIAGNOSIS — D649 Anemia, unspecified: Secondary | ICD-10-CM | POA: Insufficient documentation

## 2018-05-19 DIAGNOSIS — Z5111 Encounter for antineoplastic chemotherapy: Secondary | ICD-10-CM | POA: Insufficient documentation

## 2018-05-19 DIAGNOSIS — E538 Deficiency of other specified B group vitamins: Secondary | ICD-10-CM | POA: Insufficient documentation

## 2018-05-19 DIAGNOSIS — E785 Hyperlipidemia, unspecified: Secondary | ICD-10-CM | POA: Insufficient documentation

## 2018-05-19 DIAGNOSIS — K219 Gastro-esophageal reflux disease without esophagitis: Secondary | ICD-10-CM | POA: Insufficient documentation

## 2018-05-19 DIAGNOSIS — C8339 Diffuse large B-cell lymphoma, extranodal and solid organ sites: Secondary | ICD-10-CM | POA: Diagnosis not present

## 2018-05-19 DIAGNOSIS — Z87891 Personal history of nicotine dependence: Secondary | ICD-10-CM | POA: Diagnosis not present

## 2018-05-19 DIAGNOSIS — I129 Hypertensive chronic kidney disease with stage 1 through stage 4 chronic kidney disease, or unspecified chronic kidney disease: Secondary | ICD-10-CM | POA: Insufficient documentation

## 2018-05-19 DIAGNOSIS — Z7689 Persons encountering health services in other specified circumstances: Secondary | ICD-10-CM | POA: Insufficient documentation

## 2018-05-19 DIAGNOSIS — D500 Iron deficiency anemia secondary to blood loss (chronic): Secondary | ICD-10-CM

## 2018-05-19 DIAGNOSIS — R600 Localized edema: Secondary | ICD-10-CM | POA: Insufficient documentation

## 2018-05-19 MED ORDER — HEPARIN SOD (PORK) LOCK FLUSH 100 UNIT/ML IV SOLN
500.0000 [IU] | Freq: Once | INTRAVENOUS | Status: AC
  Administered 2018-05-19: 500 [IU] via INTRAVENOUS

## 2018-05-19 MED ORDER — SODIUM CHLORIDE 0.9% FLUSH
10.0000 mL | INTRAVENOUS | Status: DC | PRN
  Administered 2018-05-19: 10 mL via INTRAVENOUS
  Filled 2018-05-19: qty 10

## 2018-05-19 MED ORDER — SODIUM CHLORIDE 0.9 % IV SOLN
Freq: Once | INTRAVENOUS | Status: AC
  Administered 2018-05-19: 15:00:00 via INTRAVENOUS
  Filled 2018-05-19: qty 250

## 2018-05-26 ENCOUNTER — Inpatient Hospital Stay: Payer: Medicare Other

## 2018-05-26 ENCOUNTER — Other Ambulatory Visit: Payer: Self-pay

## 2018-05-26 DIAGNOSIS — C8339 Diffuse large B-cell lymphoma, extranodal and solid organ sites: Secondary | ICD-10-CM | POA: Diagnosis not present

## 2018-05-26 LAB — CBC WITH DIFFERENTIAL/PLATELET
Abs Immature Granulocytes: 0.86 10*3/uL — ABNORMAL HIGH (ref 0.00–0.07)
Basophils Absolute: 0.1 10*3/uL (ref 0.0–0.1)
Basophils Relative: 1 %
Eosinophils Absolute: 0.1 10*3/uL (ref 0.0–0.5)
Eosinophils Relative: 1 %
HCT: 29.8 % — ABNORMAL LOW (ref 36.0–46.0)
Hemoglobin: 9.1 g/dL — ABNORMAL LOW (ref 12.0–15.0)
Immature Granulocytes: 8 %
Lymphocytes Relative: 8 %
Lymphs Abs: 0.9 10*3/uL (ref 0.7–4.0)
MCH: 30.2 pg (ref 26.0–34.0)
MCHC: 30.5 g/dL (ref 30.0–36.0)
MCV: 99 fL (ref 80.0–100.0)
Monocytes Absolute: 0.8 10*3/uL (ref 0.1–1.0)
Monocytes Relative: 8 %
Neutro Abs: 7.7 10*3/uL (ref 1.7–7.7)
Neutrophils Relative %: 74 %
Platelets: 166 10*3/uL (ref 150–400)
RBC: 3.01 MIL/uL — ABNORMAL LOW (ref 3.87–5.11)
RDW: 16.7 % — ABNORMAL HIGH (ref 11.5–15.5)
WBC: 10.4 10*3/uL (ref 4.0–10.5)
nRBC: 1.2 % — ABNORMAL HIGH (ref 0.0–0.2)

## 2018-05-26 LAB — BASIC METABOLIC PANEL
Anion gap: 7 (ref 5–15)
BUN: 15 mg/dL (ref 8–23)
CO2: 26 mmol/L (ref 22–32)
Calcium: 8.4 mg/dL — ABNORMAL LOW (ref 8.9–10.3)
Chloride: 103 mmol/L (ref 98–111)
Creatinine, Ser: 0.91 mg/dL (ref 0.44–1.00)
GFR calc Af Amer: >60 mL/min (ref >60)
GFR calc non Af Amer: >60 mL/min (ref >60)
Glucose, Bld: 103 mg/dL — ABNORMAL HIGH (ref 70–99)
Potassium: 4.2 mmol/L (ref 3.5–5.1)
Sodium: 136 mmol/L (ref 135–145)

## 2018-06-05 ENCOUNTER — Inpatient Hospital Stay (HOSPITAL_BASED_OUTPATIENT_CLINIC_OR_DEPARTMENT_OTHER): Payer: Medicare Other | Admitting: Hematology and Oncology

## 2018-06-05 ENCOUNTER — Ambulatory Visit
Admission: RE | Admit: 2018-06-05 | Discharge: 2018-06-05 | Disposition: A | Discharge status: Home / Self Care | Payer: Medicare Other | Source: Ambulatory Visit | Attending: Urgent Care | Admitting: Urgent Care

## 2018-06-05 ENCOUNTER — Other Ambulatory Visit: Payer: Self-pay

## 2018-06-05 ENCOUNTER — Inpatient Hospital Stay: Payer: Medicare Other

## 2018-06-05 ENCOUNTER — Encounter: Payer: Self-pay | Admitting: Hematology and Oncology

## 2018-06-05 ENCOUNTER — Other Ambulatory Visit: Payer: Self-pay | Admitting: *Deleted

## 2018-06-05 VITALS — BP 138/82 | HR 89 | Temp 97.2°F | Resp 18 | Wt 112.2 lb

## 2018-06-05 DIAGNOSIS — R7989 Other specified abnormal findings of blood chemistry: Secondary | ICD-10-CM

## 2018-06-05 DIAGNOSIS — M329 Systemic lupus erythematosus, unspecified: Secondary | ICD-10-CM

## 2018-06-05 DIAGNOSIS — C8339 Diffuse large B-cell lymphoma, extranodal and solid organ sites: Secondary | ICD-10-CM

## 2018-06-05 DIAGNOSIS — E538 Deficiency of other specified B group vitamins: Secondary | ICD-10-CM

## 2018-06-05 DIAGNOSIS — Z5111 Encounter for antineoplastic chemotherapy: Secondary | ICD-10-CM

## 2018-06-05 DIAGNOSIS — K219 Gastro-esophageal reflux disease without esophagitis: Secondary | ICD-10-CM

## 2018-06-05 DIAGNOSIS — E785 Hyperlipidemia, unspecified: Secondary | ICD-10-CM

## 2018-06-05 DIAGNOSIS — Z87891 Personal history of nicotine dependence: Secondary | ICD-10-CM

## 2018-06-05 DIAGNOSIS — D649 Anemia, unspecified: Secondary | ICD-10-CM | POA: Diagnosis not present

## 2018-06-05 DIAGNOSIS — Z79899 Other long term (current) drug therapy: Secondary | ICD-10-CM

## 2018-06-05 DIAGNOSIS — R600 Localized edema: Secondary | ICD-10-CM

## 2018-06-05 DIAGNOSIS — Z5112 Encounter for antineoplastic immunotherapy: Secondary | ICD-10-CM

## 2018-06-05 DIAGNOSIS — N189 Chronic kidney disease, unspecified: Secondary | ICD-10-CM

## 2018-06-05 DIAGNOSIS — Z951 Presence of aortocoronary bypass graft: Secondary | ICD-10-CM

## 2018-06-05 DIAGNOSIS — I129 Hypertensive chronic kidney disease with stage 1 through stage 4 chronic kidney disease, or unspecified chronic kidney disease: Secondary | ICD-10-CM

## 2018-06-05 LAB — URINALYSIS, COMPLETE (UACMP) WITH MICROSCOPIC
Bilirubin Urine: NEGATIVE
Glucose, UA: NEGATIVE mg/dL
Hgb urine dipstick: NEGATIVE
KETONES UR: NEGATIVE mg/dL
Nitrite: NEGATIVE
PH: 7 (ref 5.0–8.0)
PROTEIN: 30 mg/dL — AB
Specific Gravity, Urine: 1.008 (ref 1.005–1.030)

## 2018-06-05 LAB — CBC WITH DIFFERENTIAL/PLATELET
Abs Immature Granulocytes: 0.11 10*3/uL — ABNORMAL HIGH (ref 0.00–0.07)
Basophils Absolute: 0.1 10*3/uL (ref 0.0–0.1)
Basophils Relative: 1 %
Eosinophils Absolute: 0 10*3/uL (ref 0.0–0.5)
Eosinophils Relative: 1 %
HCT: 31.3 % — ABNORMAL LOW (ref 36.0–46.0)
Hemoglobin: 9.7 g/dL — ABNORMAL LOW (ref 12.0–15.0)
Immature Granulocytes: 2 %
Lymphocytes Relative: 15 %
Lymphs Abs: 1.1 10*3/uL (ref 0.7–4.0)
MCH: 30.2 pg (ref 26.0–34.0)
MCHC: 31 g/dL (ref 30.0–36.0)
MCV: 97.5 fL (ref 80.0–100.0)
Monocytes Absolute: 1 10*3/uL (ref 0.1–1.0)
Monocytes Relative: 14 %
Neutro Abs: 4.9 10*3/uL (ref 1.7–7.7)
Neutrophils Relative %: 67 %
Platelets: 169 10*3/uL (ref 150–400)
RBC: 3.21 MIL/uL — ABNORMAL LOW (ref 3.87–5.11)
RDW: 17.7 % — ABNORMAL HIGH (ref 11.5–15.5)
WBC: 7.2 10*3/uL (ref 4.0–10.5)
nRBC: 0 % (ref 0.0–0.2)

## 2018-06-05 LAB — URIC ACID: Uric Acid, Serum: 4.8 mg/dL (ref 2.5–7.1)

## 2018-06-05 LAB — COMPREHENSIVE METABOLIC PANEL
ALT: 16 U/L (ref 0–44)
AST: 28 U/L (ref 15–41)
Albumin: 3.7 g/dL (ref 3.5–5.0)
Alkaline Phosphatase: 67 U/L (ref 38–126)
Anion gap: 10 (ref 5–15)
BUN: 25 mg/dL — ABNORMAL HIGH (ref 8–23)
CO2: 28 mmol/L (ref 22–32)
Calcium: 8.8 mg/dL — ABNORMAL LOW (ref 8.9–10.3)
Chloride: 101 mmol/L (ref 98–111)
Creatinine, Ser: 1.38 mg/dL — ABNORMAL HIGH (ref 0.44–1.00)
GFR calc Af Amer: 44 mL/min — ABNORMAL LOW (ref >60)
GFR calc non Af Amer: 38 mL/min — ABNORMAL LOW (ref >60)
Glucose, Bld: 109 mg/dL — ABNORMAL HIGH (ref 70–99)
Potassium: 3.6 mmol/L (ref 3.5–5.1)
Sodium: 139 mmol/L (ref 135–145)
Total Bilirubin: 0.3 mg/dL (ref 0.3–1.2)
Total Protein: 6 g/dL — ABNORMAL LOW (ref 6.5–8.1)

## 2018-06-05 LAB — LACTATE DEHYDROGENASE: LDH: 192 U/L (ref 98–192)

## 2018-06-05 LAB — MAGNESIUM: Magnesium: 2.2 mg/dL (ref 1.7–2.4)

## 2018-06-05 MED ORDER — SODIUM CHLORIDE 0.9% FLUSH
10.0000 mL | INTRAVENOUS | Status: DC | PRN
  Administered 2018-06-05: 10 mL via INTRAVENOUS
  Filled 2018-06-05: qty 10

## 2018-06-05 MED ORDER — HEPARIN SOD (PORK) LOCK FLUSH 100 UNIT/ML IV SOLN
500.0000 [IU] | Freq: Once | INTRAVENOUS | Status: AC
  Administered 2018-06-05: 500 [IU] via INTRAVENOUS
  Filled 2018-06-05: qty 5

## 2018-06-05 NOTE — Progress Notes (Signed)
[facility] day:  06/05/2018  Chief Complaint: [name] is a 72 y.o. 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  HPI: The patient was last seen in the medical oncology clinic on 05/15/2018.  At that time, she was doing well. She denied any acute complaints. Pain and nausea were well managed. She denied any significant B symptoms or interval infections. She was eating well; weight up 2 pounds. She received cycle #2 mini-RCHOP with Udencya support.  She received daily IVF from 05/16/2018 - 05/19/2018.  During the interim, patient is doing well. She denies any issues with her last cycle of chemotherapy. Patient denies any nausea, vomiting, or changes to her bowel habits. Patient denies that she has experienced any B symptoms. She denies any interval infections. Patient feels generally well. She has minor swelling in her feet and ankles.  Patient denies new medications and the use of herbal products.   Patient advises that she maintains an adequate appetite. She is eating very well well. Weight today is 112 lb 3 oz (50.9 kg), which compared to her last visit to the clinic, represents a 5 pound increase.   Patient denies pain in the clinic today.   Past Medical History:  Diagnosis Date  . [facility])    Stomach cancer  . Chronic kidney disease   . GERD (gastroesophageal reflux disease)   . HTN (hypertension)   . Hyperlipidemia   . Lupus ([place])   . S/P angioplasty with stent     Past Surgical History:  Procedure Laterality Date  . CARDIAC SURGERY    . CESAREAN SECTION    . COLONOSCOPY N/A 12/30/2017   Procedure: COLONOSCOPY;  Surgeon: Lin Landsman, MD;  Location: [facility] ENDOSCOPY;  Service: Gastroenterology;  Laterality: N/A;  . CORONARY ARTERY BYPASS GRAFT  2003  . ESOPHAGOGASTRODUODENOSCOPY N/A 12/30/2017   Procedure: ESOPHAGOGASTRODUODENOSCOPY (EGD);  Surgeon:  Lin Landsman, MD;  Location: [facility] ENDOSCOPY;  Service: Gastroenterology;  Laterality: N/A;  . ESOPHAGOGASTRODUODENOSCOPY Left 02/05/2018   Procedure: ESOPHAGOGASTRODUODENOSCOPY (EGD);  Surgeon: Virgel Manifold, MD;  Location: [facility] ENDOSCOPY;  Service: Endoscopy;  Laterality: Left;  . ESOPHAGOGASTRODUODENOSCOPY (EGD) WITH PROPOFOL N/A 03/15/2018   Procedure: ESOPHAGOGASTRODUODENOSCOPY (EGD) WITH PROPOFOL;  Surgeon: Virgel Manifold, MD;  Location: [facility] ENDOSCOPY;  Service: Endoscopy;  Laterality: N/A;  . FLEXIBLE SIGMOIDOSCOPY N/A 12/31/2017   Procedure: FLEXIBLE SIGMOIDOSCOPY;  Surgeon: Jonathon Bellows, MD;  Location: [facility] ENDOSCOPY;  Service: Gastroenterology;  Laterality: N/A;  . PORTA CATH INSERTION N/A 04/19/2018   Procedure: PORTA CATH INSERTION;  Surgeon: Algernon Huxley, MD;  Location: [facility] LAB;  Service: Cardiovascular;  Laterality: N/A;    Family History  Problem Relation Age of Onset  . Hypertension Mother   . Stroke Mother   . Vascular Disease Mother   . Hypertension Sister     Social History:  reports that she quit smoking about 16 years ago. Her smoking use included cigarettes. She has a 10.00 pack-year smoking history. She has never used smokeless tobacco. She reports that she drank alcohol. She reports that she does not use drugs.  She has smoked 1/2 pack/day x 20 years.  She stopped smoking in 2000.  She denies any exposure to radiation or toxins.  She is from [place].  She moved to the [place] 52 years ago after marrying her husband.  She was in the [employer].  The  patient is accompanied by her husband today.  Allergies:  Allergies  Allergen Reactions  . Penicillins Other (See Comments)    Pass out Has patient had a PCN reaction causing immediate rash, facial/tongue/throat swelling, SOB or lightheadedness with hypotension: Yes Has patient had a PCN reaction causing severe rash involving mucus membranes or skin necrosis: No Has patient had a PCN reaction  that required hospitalization: No Has patient had a PCN reaction occurring within the last 10 years: No If all of the above answers are "NO", then may proceed with Cephalosporin use.   . Sulfa Antibiotics Nausea Only  . Sulfasalazine Nausea And Vomiting       . Ace Inhibitors Nausea Only  . Losartan Anxiety  . Spironolactone Other (See Comments)    Felt poorly but couldn't clarify     Current Medications: Current Outpatient Medications  Medication Sig Dispense Refill  . acetaminophen (TYLENOL) 500 MG tablet Take 1,000 mg by mouth 2 (two) times daily as needed for moderate pain or headache.    . alendronate (FOSAMAX) 70 MG tablet Take 70 mg by mouth every Saturday.     Marland Kitchen allopurinol (ZYLOPRIM) 100 MG tablet TAKE 1/2 TABLET BY MOUTH DAILY 15 tablet 3  . amLODipine (NORVASC) 5 MG tablet Take 5 mg by mouth daily.     . clotrimazole-betamethasone (LOTRISONE) cream Apply 1 application topically 2 (two) times daily as needed (rash).     . feeding supplement, ENSURE ENLIVE, (ENSURE ENLIVE) LIQD Take 237 mLs by mouth 2 (two) times daily between meals. (Patient taking differently: Take 237 mLs by mouth daily. ) 90 Bottle 0  . ferrous sulfate 325 (65 FE) MG tablet Take 1 tablet (325 mg total) by mouth 2 (two) times daily with a meal. 60 tablet 3  . fluticasone (FLONASE) 50 MCG/ACT nasal spray Place 1 spray into both nostrils daily as needed for allergies or rhinitis.    . hydroxychloroquine (PLAQUENIL) 200 MG tablet Take 200 mg by mouth 2 (two) times daily.     Marland Kitchen lidocaine-prilocaine (EMLA) cream Apply to affected area once 30 g 3  . Multiple Vitamin (MULTIVITAMIN WITH MINERALS) TABS tablet Take 1 tablet by mouth daily. 180 tablet 0  . pantoprazole (PROTONIX) 40 MG tablet Take 1 tablet (40 mg total) by mouth 2 (two) times daily before a meal. 60 tablet 1  . polyethylene glycol (MIRALAX / GLYCOLAX) packet Take 17 g by mouth daily.    . predniSONE (DELTASONE) 20 MG tablet Take 3 tablets (60 mg  total) by mouth daily. Take on days 1-5 of chemotherapy. 15 tablet 5  . predniSONE (DELTASONE) 5 MG tablet Take 5 mg by mouth.    . Pseudoephedrine-APAP 30-325 MG TABS Take 1 tablet by mouth daily as needed (sinuses).     . ranitidine (ZANTAC) 150 MG tablet Take 150 mg by mouth daily as needed for heartburn.     . simvastatin (ZOCOR) 40 MG tablet Take 40 mg by mouth daily.     . traMADol (ULTRAM) 50 MG tablet Take 50 mg by mouth 2 (two) times daily as needed for moderate pain.     Marland Kitchen ondansetron (ZOFRAN) 4 MG tablet Take 1 tablet (4 mg total) by mouth 2 (two) times daily as needed for refractory nausea / vomiting. Start on day 3 after cyclophosphamide chemotherapy. (Patient not taking: Reported on 05/15/2018) 20 tablet 1   No current facility-administered medications for this visit.    Facility-Administered Medications Ordered in Other Visits  Medication Dose  Route Frequency Provider Last Rate Last Dose  . heparin lock flush 100 unit/mL  500 Units Intravenous Once ,  C, MD      . sodium chloride flush (NS) 0.9 % injection 10 mL  10 mL Intravenous PRN Lequita Asal, MD   10 mL at 06/05/18 2956    Review of Systems  Constitutional: Negative for diaphoresis, fever, malaise/fatigue and weight loss (up 5 pounds).       Feels good.  HENT: Negative.  Negative for congestion, ear discharge, ear pain, nosebleeds, sinus pain, sore throat and tinnitus.   Eyes: Negative.  Negative for blurred vision, double vision, photophobia, pain, discharge and redness.  Respiratory: Negative for cough, hemoptysis, sputum production and shortness of breath.   Cardiovascular: Negative for chest pain, palpitations, orthopnea, leg swelling and PND.  Gastrointestinal: Negative for abdominal pain, blood in stool, constipation, diarrhea, heartburn, melena, nausea and vomiting.  Genitourinary: Negative.  Negative for dysuria, frequency, hematuria and urgency.       PMH (+) for lupus nephritis.   Musculoskeletal: Negative.  Negative for back pain, falls, joint pain, myalgias and neck pain.  Skin: Negative for itching and rash.  Neurological: Negative for dizziness, tremors, sensory change, speech change, focal weakness, weakness and headaches.  Endo/Heme/Allergies: Negative.  Does not bruise/bleed easily.  Psychiatric/Behavioral: Negative for depression and memory loss. The patient is not nervous/anxious and does not have insomnia.   All other systems reviewed and are negative.  Performance status (ECOG): 1  Vital Signs: BP 138/82 (BP Location: Left Arm, Patient Position: Sitting)   Pulse 89   Temp (!) 97.2 F (36.2 C) (Tympanic)   Resp 18   Wt 112 lb 3 oz (50.9 kg)   BMI 22.66 kg/m   Physical Exam  Constitutional: She is oriented to person, place, and time and well-developed, well-nourished, and in no distress. No distress.  HENT:  Head: Normocephalic and atraumatic. 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  Mouth/Throat: Oropharynx is clear and moist and mucous membranes are normal. She has dentures. No oropharyngeal exudate.  Wearing a white cap.  Eyes: Pupils are equal, round, and reactive to light. EOM are normal. No scleral icterus.  Glasses.  Brown eyes.  Neck: Normal range of motion. Neck supple. No JVD present.  Cardiovascular: Normal rate, regular rhythm, normal heart sounds and intact distal pulses. Exam reveals no gallop and no friction rub.  No murmur heard. Pulmonary/Chest: Effort normal and breath sounds normal. No respiratory distress. She has no wheezes. She has no rales.  Abdominal: Soft. Bowel sounds are normal. She exhibits no distension and no mass. There is no abdominal tenderness. There is no rebound and no guarding.  Musculoskeletal: Normal range of motion.        General: No tenderness or edema.  Lymphadenopathy:    She has no cervical adenopathy.    She has no axillary adenopathy.       Right: No inguinal and no supraclavicular  adenopathy present.       Left: No inguinal and no supraclavicular adenopathy present.  Neurological: She is alert and oriented to person, place, and time. Gait normal.  Reflex Scores:      Patellar reflexes are 2+ on the right side and 2+ on the left side. No foot drop.  Skin: Skin is warm and dry. No rash noted. She is not diaphoretic. No erythema. No pallor.  Psychiatric: Mood, affect and judgment normal.  Nursing note and vitals reviewed.   Infusion on 06/05/2018  Component Date Value Ref Range Status  . LDH 06/05/2018 192  98 - 192 U/L Final   Performed at [facility], [address] [place], [place] [address]  . Magnesium 06/05/2018 2.2  1.7 - 2.4 mg/dL Final   Performed at [facility], [address] [place], [place] [address]  . Sodium 06/05/2018 139  135 - 145 mmol/L Final  . Potassium 06/05/2018 3.6  3.5 - 5.1 mmol/L Final  . Chloride 06/05/2018 101  98 - 111 mmol/L Final  . CO2 06/05/2018 28  22 - 32 mmol/L Final  . Glucose, Bld 06/05/2018 109* 70 - 99 mg/dL Final  . BUN 06/05/2018 25* 8 - 23 mg/dL Final  . Creatinine, Ser 06/05/2018 1.38* 0.44 - 1.00 mg/dL Final  . Calcium 06/05/2018 8.8* 8.9 - 10.3 mg/dL Final  . Total Protein 06/05/2018 6.0* 6.5 - 8.1 g/dL Final  . Albumin 06/05/2018 3.7  3.5 - 5.0 g/dL Final  . AST 06/05/2018 28  15 - 41 U/L Final  . ALT 06/05/2018 16  0 - 44 U/L Final  . Alkaline Phosphatase 06/05/2018 67  38 - 126 U/L Final  . Total Bilirubin 06/05/2018 0.3  0.3 - 1.2 mg/dL Final  . GFR calc non Af Amer 06/05/2018 38* >60 mL/min Final  . GFR calc Af Amer 06/05/2018 44* >60 mL/min Final   Comment: (NOTE) The eGFR has been calculated using the CKD EPI equation. This calculation has not been validated in all clinical situations. eGFR's persistently <60 mL/min signify possible Chronic Kidney Disease.   Georgiann Hahn gap 06/05/2018 10  5 - 15 Final   Performed at [facility], [address] [place], [place] [address]  . WBC  06/05/2018 7.2  4.0 - 10.5 K/uL Final  . RBC 06/05/2018 3.21* 3.87 - 5.11 MIL/uL Final  . Hemoglobin 06/05/2018 9.7* 12.0 - 15.0 g/dL Final  . HCT 06/05/2018 31.3* 36.0 - 46.0 % Final  . MCV 06/05/2018 97.5  80.0 - 100.0 fL Final  . MCH 06/05/2018 30.2  26.0 - 34.0 pg Final  . MCHC 06/05/2018 31.0  30.0 - 36.0 g/dL Final  . RDW 06/05/2018 17.7* 11.5 - 15.5 % Final  . Platelets 06/05/2018 169  150 - 400 K/uL Final  . nRBC 06/05/2018 0.0  0.0 - 0.2 % Final  . Neutrophils Relative % 06/05/2018 67  % Final  . Neutro Abs 06/05/2018 4.9  1.7 - 7.7 K/uL Final  . Lymphocytes Relative 06/05/2018 15  % Final  . Lymphs Abs 06/05/2018 1.1  0.7 - 4.0 K/uL Final  . Monocytes Relative 06/05/2018 14  % Final  . Monocytes Absolute 06/05/2018 1.0  0.1 - 1.0 K/uL Final  . Eosinophils Relative 06/05/2018 1  % Final  . Eosinophils Absolute 06/05/2018 0.0  0.0 - 0.5 K/uL Final  . Basophils Relative 06/05/2018 1  % Final  . Basophils Absolute 06/05/2018 0.1  0.0 - 0.1 K/uL Final  . Immature Granulocytes 06/05/2018 2  % Final  . Abs Immature Granulocytes 06/05/2018 0.11* 0.00 - 0.07 K/uL Final   Performed at [facility], [address] [place], [place] [address]    Assessment:  [name] is a 72 y.o. 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  IPI is 3 (intermediate-high risk), age-adjusted IPI is 2 (low-intermediate risk), revised IPI is 3 (poor), and NCCN-IPI is 5 (intermediate-high risk).  CNS risk is 3 (intermediate: age, LDH, stage).  EGD on 03/15/2018 revealed pathology from the duodenal scalloped lesion and duodenal sweep ulcer with involvement with diffuse large B cell lymphoma.  Random gastric biopsy was negative for lymphoma.  There was mild chronic gastritis with reactive foveolar hyperplasia.  Gastric fundus scar revealed involvement with diffuse large B cell lymphoma.  GE junction biopsy revealed no lymphoma with squamocolumnar mucosa  with mild chronic active inflammation.  The lymphoma has a non-germinal center immunophenotype with a Ki-67 proliferation index of 60% and shows Bcl-2/CMYC co-expression by  immunohistochemical analysis.   Summary of IHC results, neoplastic cells: Positive: PAX-5, CD20(variable), Bcl-2, MUM1, Bcl-6 (variable), CMYC.  Negative: CD10, CD5, CD30, CD138, Cyclin-D1, ALK-1, CD3.  EBV (ISH): Negative.  FISH for MYC gene rearrangement (8q24 gene region) was negative.  PET scan on 04/06/2018 revealed enlarged and hypermetabolic supraclavicular, mediastinal, abdominal and pelvic adenopathy. Additional smaller hypermetabolic lymph nodes identified within the left cervical lymph node chain. Largest lymph node was a 5.7 cm subcarinal lymph node (SUV 26.78).  Bone marrow aspirate and biopsy on 04/12/2018 revealed a slightly hypercellular bone marrow for age with trilineage hematopoiesis.  There were dyspoietic changes associated with the granulocyte series.  Flow cytometry revealed a predominance of T lymphocytes with relative abundance of CD8 + cells.  There was no significant B cell population.  Cytogenetics are pending.  Echo on 04/04/2018 revealed an EF of 55-60%.  She presented with a normocytic anemia.  She was admitted x 2 (12/2017 and 01/2018) with rectal bleeding.  She is intolerant of oral iron.  Diet is modest.  EGD on 12/30/2017 revealed non bleeding gastric erosions in the antrum.  Colonoscopy on 12/30/2017 revealed numerous polyps (one 5 mm in ascending colon, two 7 mm polyps in transverse colon, five 5-7 mm polyps in the descending colon, one 7 mm polyp in the sigmoid colon, one 8 mm polyp in the recto-sigmoid colon, and one 19 mm polyp in the rectum). 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    She developed hematochezia post colonoscopy.  Flexible sigmoidoscopy on 12/31/2017 revealed a rectal ulcer at the site of the polypectomy and blood in the rectum.  Clips  were placed.  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  Work-up on 04/03/2018 revealed a hematocrit of 32.5, hemoglobin 10.7, MCV 91.7, platelets 207,000, WBC 1800 with an ANC of 600.  Uric acid was 8.8.  LDH was 241 (98-192) and beta2-microglobulin  5.8 (0.6-2.4).  Negative studies included:  hepatitis B core antibody total, hepatitis B surface antigen, hepatitis C antibody, HIV testing.     She has required 4 units of PRBCs (3 units in 12/2017 and 1 unit in 01/2018).  Ferritin has been followed: 30 on 12/29/2017, 93 on 02/13/2018, 57 on 03/07/2018, and 38 on 04/03/2018.  Labs on 12/29/2017 revealed the following:  B12 was 254 (low normal).  She has been on oral B12 since 03/07/2018.  B12 was 1524 on 04/03/2018.  Folate 6.3 (> 5.9) on 12/29/2017 and 55.8 on 04/03/2018.    She is s/p 2 cycles of mini-RCHOP (04/24/2018 - 05/15/2018) with Udencya support.  Cycle #1 platelet nadir was 82,000.  She has chronic renal insufficiency.  Creatinine was 1.42 (CrCl 25 ml/min) on 04/03/2018.  She has lupus (diagnosed 1982). She is on prednisone 5 mg a day and Plaquenil.   Symptomatically, she is doing well.  Exam is unremarkable.  Creatinine has increased (0.91 to 1.38).  Plan: 1. Labs today:  CBC with diff, CMP, Mg, LDH, uric acid. 2. DLBCL Tolerating treatment well. Hold chemotherapy today secondary to increase in creatinine.  3. Renal insufficiency  BUN 25 and creatinine 1.38 (CrCl 29.5 mL/min) today.  Check UA.  Renal ultrasound today.  Encourage fluids. 4. Hypercalcemia  Calcium level 8.8.  No intervention needed. 5. TLS monitoring  LDH 192, uric acid 4.8, K+ 3.6  No evidence of TLS.  Encourage adequate hydration.  Continue on prophylactic allopurinol 50 mg a day. 6. B12 deficiency  B12 level normal on 04/03/2018 (on oral B12 since 03/07/2018).  Continue oral B12 1000 mcg  supplementation daily. 7.   RTC on 06/07/2018  for labs (CMP) and cycle #3 mini-RCHOP with Udenyca support. 8.   RTC for 500cc NS daily after chemo until Friday.  9.   RTC on 12/12 for MD assessment, labs (CBC with diff, CMP, Mg, LDH, uric acid), and cycle #4 mini-RCHOP with Udenyca support.   Honor Loh, NP  06/05/2018, 10:09 AM   I saw and evaluated the patient, participating in the key portions of the service and reviewing pertinent diagnostic studies and records.  I reviewed the nurse practitioner's note and agree with the findings and the plan.  The assessment and plan were discussed with the patient.  Multiple questions were asked by the patient and answered.   Nolon Stalls, MD 06/05/2018,10:09 AM

## 2018-06-05 NOTE — Progress Notes (Signed)
Pt in for follow up, denies any concerns or difficulties.   °

## 2018-06-06 ENCOUNTER — Inpatient Hospital Stay: Payer: Medicare Other

## 2018-06-07 ENCOUNTER — Other Ambulatory Visit: Payer: Self-pay | Admitting: *Deleted

## 2018-06-07 ENCOUNTER — Other Ambulatory Visit: Payer: Self-pay | Admitting: Hematology and Oncology

## 2018-06-07 ENCOUNTER — Inpatient Hospital Stay: Payer: Medicare Other

## 2018-06-07 VITALS — BP 144/79 | HR 73 | Temp 97.4°F | Resp 18

## 2018-06-07 DIAGNOSIS — C8339 Diffuse large B-cell lymphoma, extranodal and solid organ sites: Secondary | ICD-10-CM | POA: Diagnosis not present

## 2018-06-07 LAB — COMPREHENSIVE METABOLIC PANEL
ALT: 15 U/L (ref 0–44)
AST: 30 U/L (ref 15–41)
Albumin: 3.4 g/dL — ABNORMAL LOW (ref 3.5–5.0)
Alkaline Phosphatase: 65 U/L (ref 38–126)
Anion gap: 8 (ref 5–15)
BUN: 21 mg/dL (ref 8–23)
CO2: 25 mmol/L (ref 22–32)
Calcium: 8.3 mg/dL — ABNORMAL LOW (ref 8.9–10.3)
Chloride: 103 mmol/L (ref 98–111)
Creatinine, Ser: 1.05 mg/dL — ABNORMAL HIGH (ref 0.44–1.00)
GFR calc Af Amer: >60 mL/min (ref >60)
GFR calc non Af Amer: 53 mL/min — ABNORMAL LOW (ref >60)
Glucose, Bld: 97 mg/dL (ref 70–99)
Potassium: 3.5 mmol/L (ref 3.5–5.1)
Sodium: 136 mmol/L (ref 135–145)
Total Bilirubin: 0.5 mg/dL (ref 0.3–1.2)
Total Protein: 5.7 g/dL — ABNORMAL LOW (ref 6.5–8.1)

## 2018-06-07 MED ORDER — DEXAMETHASONE SODIUM PHOSPHATE 10 MG/ML IJ SOLN
10.0000 mg | Freq: Once | INTRAMUSCULAR | Status: AC
  Administered 2018-06-07: 10 mg via INTRAVENOUS
  Filled 2018-06-07: qty 1

## 2018-06-07 MED ORDER — SODIUM CHLORIDE 0.9 % IV SOLN
375.0000 mg/m2 | Freq: Once | INTRAVENOUS | Status: AC
  Administered 2018-06-07: 500 mg via INTRAVENOUS
  Filled 2018-06-07: qty 50

## 2018-06-07 MED ORDER — SODIUM CHLORIDE 0.9 % IV SOLN
400.0000 mg/m2 | Freq: Once | INTRAVENOUS | Status: AC
  Administered 2018-06-07: 560 mg via INTRAVENOUS
  Filled 2018-06-07: qty 28

## 2018-06-07 MED ORDER — HEPARIN SOD (PORK) LOCK FLUSH 100 UNIT/ML IV SOLN
500.0000 [IU] | Freq: Once | INTRAVENOUS | Status: AC | PRN
  Administered 2018-06-07: 500 [IU]
  Filled 2018-06-07: qty 5

## 2018-06-07 MED ORDER — PALONOSETRON HCL INJECTION 0.25 MG/5ML
0.2500 mg | Freq: Once | INTRAVENOUS | Status: AC
  Administered 2018-06-07: 0.25 mg via INTRAVENOUS
  Filled 2018-06-07: qty 5

## 2018-06-07 MED ORDER — DIPHENHYDRAMINE HCL 25 MG PO CAPS
50.0000 mg | ORAL_CAPSULE | Freq: Once | ORAL | Status: AC
  Administered 2018-06-07: 50 mg via ORAL
  Filled 2018-06-07: qty 2

## 2018-06-07 MED ORDER — DOXORUBICIN HCL CHEMO IV INJECTION 2 MG/ML
25.0000 mg/m2 | Freq: Once | INTRAVENOUS | Status: AC
  Administered 2018-06-07: 34 mg via INTRAVENOUS
  Filled 2018-06-07: qty 17

## 2018-06-07 MED ORDER — ACETAMINOPHEN 325 MG PO TABS
650.0000 mg | ORAL_TABLET | Freq: Once | ORAL | Status: AC
  Administered 2018-06-07: 650 mg via ORAL
  Filled 2018-06-07: qty 2

## 2018-06-07 MED ORDER — SODIUM CHLORIDE 0.9 % IV SOLN
Freq: Once | INTRAVENOUS | Status: AC
  Administered 2018-06-07: 09:00:00 via INTRAVENOUS
  Filled 2018-06-07: qty 250

## 2018-06-07 MED ORDER — SODIUM CHLORIDE 0.9% FLUSH
10.0000 mL | INTRAVENOUS | Status: DC | PRN
  Administered 2018-06-07: 10 mL
  Filled 2018-06-07: qty 10

## 2018-06-07 MED ORDER — VINCRISTINE SULFATE CHEMO INJECTION 1 MG/ML
1.0000 mg | Freq: Once | INTRAVENOUS | Status: AC
  Administered 2018-06-07: 1 mg via INTRAVENOUS
  Filled 2018-06-07: qty 1

## 2018-06-07 MED ORDER — SODIUM CHLORIDE 0.9 % IV SOLN: 375.0000 mg/m2 | Freq: Once | INTRAVENOUS | Status: DC

## 2018-06-07 NOTE — Progress Notes (Signed)
Patient did well with treatment today.  She asked for her port to be left accessed for Friday's infusion.  Port heplocked.  AVS given to patient and her husband.  Next appointments reviewed with patient.

## 2018-06-07 NOTE — Patient Instructions (Signed)
Cyclophosphamide injection What is this medicine? CYCLOPHOSPHAMIDE (sye kloe FOSS fa mide) is a chemotherapy drug. It slows the growth of cancer cells. This medicine is used to treat many types of cancer like lymphoma, myeloma, leukemia, breast cancer, and ovarian cancer, to name a few. This medicine may be used for other purposes; ask your health care provider or pharmacist if you have questions. COMMON BRAND NAME(S): Cytoxan, Neosar What should I tell my health care provider before I take this medicine? They need to know if you have any of these conditions: -blood disorders -history of other chemotherapy -infection -kidney disease -liver disease -recent or ongoing radiation therapy -tumors in the bone marrow -an unusual or allergic reaction to cyclophosphamide, other chemotherapy, other medicines, foods, dyes, or preservatives -pregnant or trying to get pregnant -breast-feeding How should I use this medicine? This drug is usually given as an injection into a vein or muscle or by infusion into a vein. It is administered in a hospital or clinic by a specially trained health care professional. Talk to your pediatrician regarding the use of this medicine in children. Special care may be needed. Overdosage: If you think you have taken too much of this medicine contact a poison control center or emergency room at once. NOTE: This medicine is only for you. Do not share this medicine with others. What if I miss a dose? It is important not to miss your dose. Call your doctor or health care professional if you are unable to keep an appointment. What may interact with this medicine? This medicine may interact with the following medications: -amiodarone -amphotericin B -azathioprine -certain antiviral medicines for HIV or AIDS such as protease inhibitors (e.g., indinavir, ritonavir) and zidovudine -certain blood pressure medications such as benazepril, captopril, enalapril, fosinopril,  lisinopril, moexipril, monopril, perindopril, quinapril, ramipril, trandolapril -certain cancer medications such as anthracyclines (e.g., daunorubicin, doxorubicin), busulfan, cytarabine, paclitaxel, pentostatin, tamoxifen, trastuzumab -certain diuretics such as chlorothiazide, chlorthalidone, hydrochlorothiazide, indapamide, metolazone -certain medicines that treat or prevent blood clots like warfarin -certain muscle relaxants such as succinylcholine -cyclosporine -etanercept -indomethacin -medicines to increase blood counts like filgrastim, pegfilgrastim, sargramostim -medicines used as general anesthesia -metronidazole -natalizumab This list may not describe all possible interactions. Give your health care provider a list of all the medicines, herbs, non-prescription drugs, or dietary supplements you use. Also tell them if you smoke, drink alcohol, or use illegal drugs. Some items may interact with your medicine. What should I watch for while using this medicine? Visit your doctor for checks on your progress. This drug may make you feel generally unwell. This is not uncommon, as chemotherapy can affect healthy cells as well as cancer cells. Report any side effects. Continue your course of treatment even though you feel ill unless your doctor tells you to stop. Drink water or other fluids as directed. Urinate often, even at night. In some cases, you may be given additional medicines to help with side effects. Follow all directions for their use. Call your doctor or health care professional for advice if you get a fever, chills or sore throat, or other symptoms of a cold or flu. Do not treat yourself. This drug decreases your body's ability to fight infections. Try to avoid being around people who are sick. This medicine may increase your risk to bruise or bleed. Call your doctor or health care professional if you notice any unusual bleeding. Be careful brushing and flossing your teeth or using a  toothpick because you may get an infection or bleed   more easily. If you have any dental work done, tell your dentist you are receiving this medicine. You may get drowsy or dizzy. Do not drive, use machinery, or do anything that needs mental alertness until you know how this medicine affects you. Do not become pregnant while taking this medicine or for 1 year after stopping it. Women should inform their doctor if they wish to become pregnant or think they might be pregnant. Men should not father a child while taking this medicine and for 4 months after stopping it. There is a potential for serious side effects to an unborn child. Talk to your health care professional or pharmacist for more information. Do not breast-feed an infant while taking this medicine. This medicine may interfere with the ability to have a child. This medicine has caused ovarian failure in some women. This medicine has caused reduced sperm counts in some men. You should talk with your doctor or health care professional if you are concerned about your fertility. If you are going to have surgery, tell your doctor or health care professional that you have taken this medicine. What side effects may I notice from receiving this medicine? Side effects that you should report to your doctor or health care professional as soon as possible: -allergic reactions like skin rash, itching or hives, swelling of the face, lips, or tongue -low blood counts - this medicine may decrease the number of white blood cells, red blood cells and platelets. You may be at increased risk for infections and bleeding. -signs of infection - fever or chills, cough, sore throat, pain or difficulty passing urine -signs of decreased platelets or bleeding - bruising, pinpoint red spots on the skin, black, tarry stools, blood in the urine -signs of decreased red blood cells - unusually weak or tired, fainting spells, lightheadedness -breathing problems -dark  urine -dizziness -palpitations -swelling of the ankles, feet, hands -trouble passing urine or change in the amount of urine -weight gain -yellowing of the eyes or skin Side effects that usually do not require medical attention (report to your doctor or health care professional if they continue or are bothersome): -changes in nail or skin color -hair loss -missed menstrual periods -mouth sores -nausea, vomiting This list may not describe all possible side effects. Call your doctor for medical advice about side effects. You may report side effects to FDA at 1-800-FDA-1088. Where should I keep my medicine? This drug is given in a hospital or clinic and will not be stored at home. NOTE: This sheet is a summary. It may not cover all possible information. If you have questions about this medicine, talk to your doctor, pharmacist, or health care provider.  2018 Else[employer]/Gold Standard (2012-05-19 16:22:58) Vincristine injection What is this medicine? VINCRISTINE (vin KRIS teen) is a chemotherapy drug. It slows the growth of cancer cells. This medicine is used to treat many types of cancer like Hodgkin's disease, leukemia, non-Hodgkin's lymphoma, neuroblastoma (brain cancer), rhabdomyosarcoma, and Wilms' tumor. This medicine may be used for other purposes; ask your health care provider or pharmacist if you have questions. COMMON BRAND NAME(S): Oncovin, Vincasar PFS What should I tell my health care provider before I take this medicine? They need to know if you have any of these conditions: -blood disorders -gout -infection (especially chickenpox, cold sores, or herpes) -kidney disease -liver disease -lung disease -nervous system disease like Charcot-Marie-Tooth (CMT) -recent or ongoing radiation therapy -an unusual or allergic reaction to vincristine, other chemotherapy agents, other medicines, foods, dyes, or  preservatives -pregnant or trying to get pregnant -breast-feeding How should I  use this medicine? This drug is given as an infusion into a vein. It is administered in a hospital or clinic by a specially trained health care professional. If you have pain, swelling, burning, or any unusual feeling around the site of your injection, tell your health care professional right away. Talk to your pediatrician regarding the use of this medicine in children. While this drug may be prescribed for selected conditions, precautions do apply. Overdosage: If you think you have taken too much of this medicine contact a poison control center or emergency room at once. NOTE: This medicine is only for you. Do not share this medicine with others. What if I miss a dose? It is important not to miss your dose. Call your doctor or health care professional if you are unable to keep an appointment. What may interact with this medicine? Do not take this medicine with any of the following medications: -itraconazole -mibefradil -voriconazole This medicine may also interact with the following medications: -cyclosporine -erythromycin -fluconazole -ketoconazole -medicines for HIV like delavirdine, efavirenz, nevirapine -medicines for seizures like ethotoin, fosphenotoin, phenytoin -medicines to increase blood counts like filgrastim, pegfilgrastim, sargramostim -other chemotherapy drugs like cisplatin, L-asparaginase, methotrexate, mitomycin, paclitaxel -pegaspargase -vaccines -zalcitabine, ddC Talk to your doctor or health care professional before taking any of these medicines: -acetaminophen -aspirin -ibuprofen -ketoprofen -naproxen This list may not describe all possible interactions. Give your health care provider a list of all the medicines, herbs, non-prescription drugs, or dietary supplements you use. Also tell them if you smoke, drink alcohol, or use illegal drugs. Some items may interact with your medicine. What should I watch for while using this medicine? Your condition will be  monitored carefully while you are receiving this medicine. You will need important blood work done while you are taking this medicine. This drug may make you feel generally unwell. This is not uncommon, as chemotherapy can affect healthy cells as well as cancer cells. Report any side effects. Continue your course of treatment even though you feel ill unless your doctor tells you to stop. In some cases, you may be given additional medicines to help with side effects. Follow all directions for their use. Call your doctor or health care professional for advice if you get a fever, chills or sore throat, or other symptoms of a cold or flu. Do not treat yourself. Avoid taking products that contain aspirin, acetaminophen, ibuprofen, naproxen, or ketoprofen unless instructed by your doctor. These medicines may hide a fever. Do not become pregnant while taking this medicine. Women should inform their doctor if they wish to become pregnant or think they might be pregnant. There is a potential for serious side effects to an unborn child. Talk to your health care professional or pharmacist for more information. Do not breast-feed an infant while taking this medicine. Men may have a lower sperm count while taking this medicine. Talk to your doctor if you plan to father a child. What side effects may I notice from receiving this medicine? Side effects that you should report to your doctor or health care professional as soon as possible: -allergic reactions like skin rash, itching or hives, swelling of the face, lips, or tongue -breathing problems -confusion or changes in emotions or moods -constipation -cough -mouth sores -muscle weakness -nausea and vomiting -pain, swelling, redness or irritation at the injection site -pain, tingling, numbness in the hands or feet -problems with balance, talking, walking -seizures -stomach  pain -trouble passing urine or change in the amount of urine Side effects that  usually do not require medical attention (report to your doctor or health care professional if they continue or are bothersome): -diarrhea -hair loss -jaw pain -loss of appetite This list may not describe all possible side effects. Call your doctor for medical advice about side effects. You may report side effects to FDA at 1-800-FDA-1088. Where should I keep my medicine? This drug is given in a hospital or clinic and will not be stored at home. NOTE: This sheet is a summary. It may not cover all possible information. If you have questions about this medicine, talk to your doctor, pharmacist, or health care provider.  2018 Else[employer]/Gold Standard (2008-04-01 17:17:13) Doxorubicin injection What is this medicine? DOXORUBICIN (dox oh ROO bi sin) is a chemotherapy drug. It is used to treat many kinds of cancer like leukemia, lymphoma, neuroblastoma, sarcoma, and Wilms' tumor. It is also used to treat bladder cancer, breast cancer, lung cancer, ovarian cancer, stomach cancer, and thyroid cancer. This medicine may be used for other purposes; ask your health care provider or pharmacist if you have questions. COMMON BRAND NAME(S): Adriamycin, Adriamycin PFS, Adriamycin RDF, Rubex What should I tell my health care provider before I take this medicine? They need to know if you have any of these conditions: -heart disease -history of low blood counts caused by a medicine -liver disease -recent or ongoing radiation therapy -an unusual or allergic reaction to doxorubicin, other chemotherapy agents, other medicines, foods, dyes, or preservatives -pregnant or trying to get pregnant -breast-feeding How should I use this medicine? This drug is given as an infusion into a vein. It is administered in a hospital or clinic by a specially trained health care professional. If you have pain, swelling, burning or any unusual feeling around the site of your injection, tell your health care professional right  away. Talk to your pediatrician regarding the use of this medicine in children. Special care may be needed. Overdosage: If you think you have taken too much of this medicine contact a poison control center or emergency room at once. NOTE: This medicine is only for you. Do not share this medicine with others. What if I miss a dose? It is important not to miss your dose. Call your doctor or health care professional if you are unable to keep an appointment. What may interact with this medicine? This medicine may interact with the following medications: -6-mercaptopurine -paclitaxel -phenytoin -St. John's Wort -trastuzumab -verapamil This list may not describe all possible interactions. Give your health care provider a list of all the medicines, herbs, non-prescription drugs, or dietary supplements you use. Also tell them if you smoke, drink alcohol, or use illegal drugs. Some items may interact with your medicine. What should I watch for while using this medicine? This drug may make you feel generally unwell. This is not uncommon, as chemotherapy can affect healthy cells as well as cancer cells. Report any side effects. Continue your course of treatment even though you feel ill unless your doctor tells you to stop. There is a maximum amount of this medicine you should receive throughout your life. The amount depends on the medical condition being treated and your overall health. Your doctor will watch how much of this medicine you receive in your lifetime. Tell your doctor if you have taken this medicine before. You may need blood work done while you are taking this medicine. Your urine may turn red for a  few days after your dose. This is not blood. If your urine is dark or brown, call your doctor. In some cases, you may be given additional medicines to help with side effects. Follow all directions for their use. Call your doctor or health care professional for advice if you get a fever, chills or  sore throat, or other symptoms of a cold or flu. Do not treat yourself. This drug decreases your body's ability to fight infections. Try to avoid being around people who are sick. This medicine may increase your risk to bruise or bleed. Call your doctor or health care professional if you notice any unusual bleeding. Talk to your doctor about your risk of cancer. You may be more at risk for certain types of cancers if you take this medicine. Do not become pregnant while taking this medicine or for 6 months after stopping it. Women should inform their doctor if they wish to become pregnant or think they might be pregnant. Men should not father a child while taking this medicine and for 6 months after stopping it. There is a potential for serious side effects to an unborn child. Talk to your health care professional or pharmacist for more information. Do not breast-feed an infant while taking this medicine. This medicine has caused ovarian failure in some women and reduced sperm counts in some men This medicine may interfere with the ability to have a child. Talk with your doctor or health care professional if you are concerned about your fertility. What side effects may I notice from receiving this medicine? Side effects that you should report to your doctor or health care professional as soon as possible: -allergic reactions like skin rash, itching or hives, swelling of the face, lips, or tongue -breathing problems -chest pain -fast or irregular heartbeat -low blood counts - this medicine may decrease the number of white blood cells, red blood cells and platelets. You may be at increased risk for infections and bleeding. -pain, redness, or irritation at site where injected -signs of infection - fever or chills, cough, sore throat, pain or difficulty passing urine -signs of decreased platelets or bleeding - bruising, pinpoint red spots on the skin, black, tarry stools, blood in the urine -swelling of  the ankles, feet, hands -tiredness -weakness Side effects that usually do not require medical attention (report to your doctor or health care professional if they continue or are bothersome): -diarrhea -hair loss -mouth sores -nail discoloration or damage -nausea -red colored urine -vomiting This list may not describe all possible side effects. Call your doctor for medical advice about side effects. You may report side effects to FDA at 1-800-FDA-1088. Where should I keep my medicine? This drug is given in a hospital or clinic and will not be stored at home. NOTE: This sheet is a summary. It may not cover all possible information. If you have questions about this medicine, talk to your doctor, pharmacist, or health care provider.  2018 [employer]/Gold Standard (2015-09-01 11:28:51) Rituximab injection What is this medicine? RITUXIMAB (ri TUX i mab) is a monoclonal antibody. It is used to treat certain types of cancer like non-Hodgkin lymphoma and chronic lymphocytic leukemia. It is also used to treat rheumatoid arthritis, granulomatosis with polyangiitis (or Wegener's granulomatosis), and microscopic polyangiitis. This medicine may be used for other purposes; ask your health care provider or pharmacist if you have questions. COMMON BRAND NAME(S): Rituxan What should I tell my health care provider before I take this medicine? They need to know  if you have any of these conditions: -heart disease -infection (especially a virus infection such as hepatitis B, chickenpox, cold sores, or herpes) -immune system problems -irregular heartbeat -kidney disease -lung or breathing disease, like asthma -recently received or scheduled to receive a vaccine -an unusual or allergic reaction to rituximab, mouse proteins, other medicines, foods, dyes, or preservatives -pregnant or trying to get pregnant -breast-feeding How should I use this medicine? This medicine is for infusion into a vein. It is  administered in a hospital or clinic by a specially trained health care professional. A special MedGuide will be given to you by the pharmacist with each prescription and refill. Be sure to read this information carefully each time. Talk to your pediatrician regarding the use of this medicine in children. This medicine is not approved for use in children. Overdosage: If you think you have taken too much of this medicine contact a poison control center or emergency room at once. NOTE: This medicine is only for you. Do not share this medicine with others. What if I miss a dose? It is important not to miss a dose. Call your doctor or health care professional if you are unable to keep an appointment. What may interact with this medicine? -cisplatin -other medicines for arthritis like disease modifying antirheumatic drugs or tumor necrosis factor inhibitors -live virus vaccines This list may not describe all possible interactions. Give your health care provider a list of all the medicines, herbs, non-prescription drugs, or dietary supplements you use. Also tell them if you smoke, drink alcohol, or use illegal drugs. Some items may interact with your medicine. What should I watch for while using this medicine? Your condition will be monitored carefully while you are receiving this medicine. You may need blood work done while you are taking this medicine. This medicine can cause serious allergic reactions. To reduce your risk you may need to take medicine before treatment with this medicine. Take your medicine as directed. In some patients, this medicine may cause a serious brain infection that may cause death. If you have any problems seeing, thinking, speaking, walking, or standing, tell your doctor right away. If you cannot reach your doctor, urgently seek other source of medical care. Call your doctor or health care professional for advice if you get a fever, chills or sore throat, or other symptoms of  a cold or flu. Do not treat yourself. This drug decreases your body's ability to fight infections. Try to avoid being around people who are sick. Do not become pregnant while taking this medicine or for 12 months after stopping it. Women should inform their doctor if they wish to become pregnant or think they might be pregnant. There is a potential for serious side effects to an unborn child. Talk to your health care professional or pharmacist for more information. What side effects may I notice from receiving this medicine? Side effects that you should report to your doctor or health care professional as soon as possible: -breathing problems -chest pain -dizziness or feeling faint -fast, irregular heartbeat -low blood counts - this medicine may decrease the number of white blood cells, red blood cells and platelets. You may be at increased risk for infections and bleeding. -mouth sores -redness, blistering, peeling or loosening of the skin, including inside the mouth (this can be added for any serious or exfoliative rash that could lead to hospitalization) -signs of infection - fever or chills, cough, sore throat, pain or difficulty passing urine -signs and symptoms of  kidney injury like trouble passing urine or change in the amount of urine -signs and symptoms of liver injury like dark yellow or brown urine; general ill feeling or flu-like symptoms; light-colored stools; loss of appetite; nausea; right upper belly pain; unusually weak or tired; yellowing of the eyes or skin -stomach pain -vomiting Side effects that usually do not require medical attention (report to your doctor or health care professional if they continue or are bothersome): -headache -joint pain -muscle cramps or muscle pain This list may not describe all possible side effects. Call your doctor for medical advice about side effects. You may report side effects to FDA at 1-800-FDA-1088. Where should I keep my medicine? This  drug is given in a hospital or clinic and will not be stored at home. NOTE: This sheet is a summary. It may not cover all possible information. If you have questions about this medicine, talk to your doctor, pharmacist, or health care provider.  2018 [employer]/Gold Standard (2016-02-11 15:28:09)

## 2018-06-08 ENCOUNTER — Inpatient Hospital Stay: Payer: Medicare Other

## 2018-06-08 ENCOUNTER — Ambulatory Visit: Payer: Medicare Other

## 2018-06-08 VITALS — BP 141/72 | HR 98 | Temp 98.8°F | Resp 18

## 2018-06-08 DIAGNOSIS — C8339 Diffuse large B-cell lymphoma, extranodal and solid organ sites: Secondary | ICD-10-CM | POA: Diagnosis not present

## 2018-06-08 DIAGNOSIS — D500 Iron deficiency anemia secondary to blood loss (chronic): Secondary | ICD-10-CM

## 2018-06-08 LAB — URINE CULTURE: Culture: 80000 — AB

## 2018-06-08 MED ORDER — SODIUM CHLORIDE 0.9 % IV SOLN
Freq: Once | INTRAVENOUS | Status: AC
  Administered 2018-06-08: 13:00:00 via INTRAVENOUS
  Filled 2018-06-08: qty 250

## 2018-06-08 MED ORDER — PEGFILGRASTIM-CBQV 6 MG/0.6ML ~~LOC~~ SOSY
6.0000 mg | PREFILLED_SYRINGE | Freq: Once | SUBCUTANEOUS | Status: AC
  Administered 2018-06-08: 6 mg via SUBCUTANEOUS
  Filled 2018-06-08: qty 0.6

## 2018-06-08 MED ORDER — HEPARIN SOD (PORK) LOCK FLUSH 100 UNIT/ML IV SOLN
500.0000 [IU] | Freq: Once | INTRAVENOUS | Status: AC | PRN
  Administered 2018-06-08: 500 [IU]

## 2018-06-08 MED ORDER — SODIUM CHLORIDE 0.9% FLUSH
10.0000 mL | Freq: Once | INTRAVENOUS | Status: AC | PRN
  Administered 2018-06-08: 10 mL
  Filled 2018-06-08: qty 10

## 2018-06-09 ENCOUNTER — Inpatient Hospital Stay: Payer: Medicare Other

## 2018-06-09 DIAGNOSIS — D500 Iron deficiency anemia secondary to blood loss (chronic): Secondary | ICD-10-CM

## 2018-06-09 DIAGNOSIS — C8339 Diffuse large B-cell lymphoma, extranodal and solid organ sites: Secondary | ICD-10-CM | POA: Diagnosis not present

## 2018-06-09 MED ORDER — SODIUM CHLORIDE 0.9 % IV SOLN
Freq: Once | INTRAVENOUS | Status: AC
  Administered 2018-06-09: 11:00:00 via INTRAVENOUS
  Filled 2018-06-09: qty 250

## 2018-06-09 MED ORDER — HEPARIN SOD (PORK) LOCK FLUSH 100 UNIT/ML IV SOLN
500.0000 [IU] | Freq: Once | INTRAVENOUS | Status: AC
  Administered 2018-06-09: 500 [IU] via INTRAVENOUS

## 2018-06-09 MED ORDER — HEPARIN SOD (PORK) LOCK FLUSH 100 UNIT/ML IV SOLN
INTRAVENOUS | Status: AC
  Filled 2018-06-09: qty 5

## 2018-06-29 ENCOUNTER — Inpatient Hospital Stay (HOSPITAL_BASED_OUTPATIENT_CLINIC_OR_DEPARTMENT_OTHER): Payer: Medicare Other | Admitting: Hematology and Oncology

## 2018-06-29 ENCOUNTER — Inpatient Hospital Stay: Payer: Medicare Other | Attending: Hematology and Oncology

## 2018-06-29 ENCOUNTER — Encounter: Payer: Self-pay | Admitting: Hematology and Oncology

## 2018-06-29 ENCOUNTER — Inpatient Hospital Stay: Payer: Medicare Other

## 2018-06-29 VITALS — BP 133/66 | HR 97 | Temp 98.0°F | Resp 18 | Wt 112.1 lb

## 2018-06-29 DIAGNOSIS — E785 Hyperlipidemia, unspecified: Secondary | ICD-10-CM | POA: Diagnosis not present

## 2018-06-29 DIAGNOSIS — M329 Systemic lupus erythematosus, unspecified: Secondary | ICD-10-CM

## 2018-06-29 DIAGNOSIS — K219 Gastro-esophageal reflux disease without esophagitis: Secondary | ICD-10-CM | POA: Insufficient documentation

## 2018-06-29 DIAGNOSIS — Z87891 Personal history of nicotine dependence: Secondary | ICD-10-CM | POA: Insufficient documentation

## 2018-06-29 DIAGNOSIS — E538 Deficiency of other specified B group vitamins: Secondary | ICD-10-CM | POA: Diagnosis not present

## 2018-06-29 DIAGNOSIS — R600 Localized edema: Secondary | ICD-10-CM | POA: Insufficient documentation

## 2018-06-29 DIAGNOSIS — D72819 Decreased white blood cell count, unspecified: Secondary | ICD-10-CM

## 2018-06-29 DIAGNOSIS — C8338 Diffuse large B-cell lymphoma, lymph nodes of multiple sites: Secondary | ICD-10-CM | POA: Insufficient documentation

## 2018-06-29 DIAGNOSIS — I129 Hypertensive chronic kidney disease with stage 1 through stage 4 chronic kidney disease, or unspecified chronic kidney disease: Secondary | ICD-10-CM | POA: Insufficient documentation

## 2018-06-29 DIAGNOSIS — N189 Chronic kidney disease, unspecified: Secondary | ICD-10-CM | POA: Insufficient documentation

## 2018-06-29 DIAGNOSIS — C8339 Diffuse large B-cell lymphoma, extranodal and solid organ sites: Secondary | ICD-10-CM

## 2018-06-29 LAB — COMPREHENSIVE METABOLIC PANEL
ALT: 15 U/L (ref 0–44)
AST: 29 U/L (ref 15–41)
Albumin: 3.7 g/dL (ref 3.5–5.0)
Alkaline Phosphatase: 73 U/L (ref 38–126)
Anion gap: 8 (ref 5–15)
BUN: 26 mg/dL — ABNORMAL HIGH (ref 8–23)
CO2: 25 mmol/L (ref 22–32)
Calcium: 8.6 mg/dL — ABNORMAL LOW (ref 8.9–10.3)
Chloride: 105 mmol/L (ref 98–111)
Creatinine, Ser: 1.27 mg/dL — ABNORMAL HIGH (ref 0.44–1.00)
GFR calc Af Amer: 50 mL/min — ABNORMAL LOW (ref >60)
GFR calc non Af Amer: 43 mL/min — ABNORMAL LOW (ref >60)
Glucose, Bld: 117 mg/dL — ABNORMAL HIGH (ref 70–99)
Potassium: 3.5 mmol/L (ref 3.5–5.1)
Sodium: 138 mmol/L (ref 135–145)
Total Bilirubin: 0.5 mg/dL (ref 0.3–1.2)
Total Protein: 6 g/dL — ABNORMAL LOW (ref 6.5–8.1)

## 2018-06-29 LAB — CBC WITH DIFFERENTIAL/PLATELET
Abs Immature Granulocytes: 0.01 10*3/uL (ref 0.00–0.07)
Basophils Absolute: 0 10*3/uL (ref 0.0–0.1)
Basophils Relative: 1 %
Eosinophils Absolute: 0 10*3/uL (ref 0.0–0.5)
Eosinophils Relative: 1 %
HCT: 32.6 % — ABNORMAL LOW (ref 36.0–46.0)
Hemoglobin: 10.3 g/dL — ABNORMAL LOW (ref 12.0–15.0)
Immature Granulocytes: 0 %
Lymphocytes Relative: 30 %
Lymphs Abs: 0.8 10*3/uL (ref 0.7–4.0)
MCH: 31.3 pg (ref 26.0–34.0)
MCHC: 31.6 g/dL (ref 30.0–36.0)
MCV: 99.1 fL (ref 80.0–100.0)
Monocytes Absolute: 0.8 10*3/uL (ref 0.1–1.0)
Monocytes Relative: 31 %
Neutro Abs: 1 10*3/uL — ABNORMAL LOW (ref 1.7–7.7)
Neutrophils Relative %: 37 %
Platelets: 174 10*3/uL (ref 150–400)
RBC: 3.29 MIL/uL — ABNORMAL LOW (ref 3.87–5.11)
RDW: 16.8 % — ABNORMAL HIGH (ref 11.5–15.5)
WBC: 2.7 10*3/uL — ABNORMAL LOW (ref 4.0–10.5)
nRBC: 0 % (ref 0.0–0.2)

## 2018-06-29 LAB — LACTATE DEHYDROGENASE: LDH: 191 U/L (ref 98–192)

## 2018-06-29 LAB — MAGNESIUM: Magnesium: 1.9 mg/dL (ref 1.7–2.4)

## 2018-06-29 LAB — URIC ACID: Uric Acid, Serum: 5.7 mg/dL (ref 2.5–7.1)

## 2018-06-29 MED ORDER — SODIUM CHLORIDE 0.9% FLUSH
10.0000 mL | INTRAVENOUS | Status: DC | PRN
  Administered 2018-06-29: 10 mL via INTRAVENOUS
  Filled 2018-06-29: qty 10

## 2018-06-29 MED ORDER — HEPARIN SOD (PORK) LOCK FLUSH 100 UNIT/ML IV SOLN
500.0000 [IU] | Freq: Once | INTRAVENOUS | Status: AC
  Administered 2018-06-29: 500 [IU] via INTRAVENOUS
  Filled 2018-06-29: qty 5

## 2018-06-29 NOTE — Progress Notes (Signed)
[facility] day:  06/29/2018  Chief Complaint: [name] is a 72 y.o. 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  HPI: The patient was last seen in the medical oncology clinic on 06/05/2018.  At that time, she was doing well. She denied any B symptoms.  Creatinine had increased to 1.38.  Chemotherapy was held x 2 days.    Renal ultrasound on 06/05/2018 revealed echogenic kidneys seen with medical renal disease.  There was no obstructive uropathy.  There were stable 2.8 x 2.3 x 2.4 cm LEFT renal non suspicious cyst, Bosniak 2.  Repeat creatinine was 1.05 on 06/07/2018.  She received cycle #3 mini-RHOP on 06/07/2018 with Udencya support on 06/07/2018.  Sh received IVF on 06/08/2018 and 06/09/2018.  During the interim, her husband notes that the last cycle of chemotherapy "kicked her in the butt".  It took her a few days to get over the body aches.  She describes being "more tired".  She currently feels "ok".  She is eating and drinking well.  She has flare of her rash.   Past Medical History:  Diagnosis Date  . Cancer [facility])    Stomach cancer  . Chronic kidney disease   . GERD (gastroesophageal reflux disease)   . HTN (hypertension)   . Hyperlipidemia   . Lupus ([place])   . S/P angioplasty with stent     Past Surgical History:  Procedure Laterality Date  . CARDIAC SURGERY    . CESAREAN SECTION    . COLONOSCOPY N/A 12/30/2017   Procedure: COLONOSCOPY;  Surgeon: Lin Landsman, MD;  Location: [facility] ENDOSCOPY;  Service: Gastroenterology;  Laterality: N/A;  . CORONARY ARTERY BYPASS GRAFT  2003  . ESOPHAGOGASTRODUODENOSCOPY N/A 12/30/2017   Procedure: ESOPHAGOGASTRODUODENOSCOPY (EGD);  Surgeon: Lin Landsman, MD;  Location: [facility] ENDOSCOPY;  Service: Gastroenterology;  Laterality: N/A;  . ESOPHAGOGASTRODUODENOSCOPY Left 02/05/2018   Procedure: ESOPHAGOGASTRODUODENOSCOPY  (EGD);  Surgeon: Virgel Manifold, MD;  Location: [facility] ENDOSCOPY;  Service: Endoscopy;  Laterality: Left;  . ESOPHAGOGASTRODUODENOSCOPY (EGD) WITH PROPOFOL N/A 03/15/2018   Procedure: ESOPHAGOGASTRODUODENOSCOPY (EGD) WITH PROPOFOL;  Surgeon: Virgel Manifold, MD;  Location: [facility] ENDOSCOPY;  Service: Endoscopy;  Laterality: N/A;  . FLEXIBLE SIGMOIDOSCOPY N/A 12/31/2017   Procedure: FLEXIBLE SIGMOIDOSCOPY;  Surgeon: Jonathon Bellows, MD;  Location: [facility] ENDOSCOPY;  Service: Gastroenterology;  Laterality: N/A;  . PORTA CATH INSERTION N/A 04/19/2018   Procedure: PORTA CATH INSERTION;  Surgeon: Algernon Huxley, MD;  Location: [place] CV LAB;  Service: Cardiovascular;  Laterality: N/A;    Family History  Problem Relation Age of Onset  . Hypertension Mother   . Stroke Mother   . Vascular Disease Mother   . Hypertension Sister     Social History:  reports that she quit smoking about 16 years ago. Her smoking use included cigarettes. She has a 10.00 pack-year smoking history. She has never used smokeless tobacco. She reports previous alcohol use. She reports that she does not use drugs.  She has smoked 1/2 pack/day x 20 years.  She stopped smoking in 2000.  She denies any exposure to radiation or toxins.  She is from [place].  She moved to the [place] 52 years ago after marrying her husband.  She was in the [employer].  The patient is accompanied by her husband today.  Allergies:  Allergies  Allergen Reactions  . Penicillins Other (See Comments)  Pass out Has patient had a PCN reaction causing immediate rash, facial/tongue/throat swelling, SOB or lightheadedness with hypotension: Yes Has patient had a PCN reaction causing severe rash involving mucus membranes or skin necrosis: No Has patient had a PCN reaction that required hospitalization: No Has patient had a PCN reaction occurring within the last 10 years: No If all of the above answers are "NO", then may proceed with Cephalosporin use.    . Sulfa Antibiotics Nausea Only  . Sulfasalazine Nausea And Vomiting       . Ace Inhibitors Nausea Only  . Losartan Anxiety  . Spironolactone Other (See Comments)    Felt poorly but couldn't clarify     Current Medications: Current Outpatient Medications  Medication Sig Dispense Refill  . acetaminophen (TYLENOL) 500 MG tablet Take 1,000 mg by mouth 2 (two) times daily as needed for moderate pain or headache.    . alendronate (FOSAMAX) 70 MG tablet Take 70 mg by mouth every Saturday.     Marland Kitchen allopurinol (ZYLOPRIM) 100 MG tablet TAKE 1/2 TABLET BY MOUTH DAILY 15 tablet 3  . amLODipine (NORVASC) 5 MG tablet Take 5 mg by mouth daily.     . clotrimazole-betamethasone (LOTRISONE) cream Apply 1 application topically 2 (two) times daily as needed (rash).     . feeding supplement, ENSURE ENLIVE, (ENSURE ENLIVE) LIQD Take 237 mLs by mouth 2 (two) times daily between meals. (Patient taking differently: Take 237 mLs by mouth daily. ) 90 Bottle 0  . ferrous sulfate 325 (65 FE) MG tablet Take 1 tablet (325 mg total) by mouth 2 (two) times daily with a meal. 60 tablet 3  . fluticasone (FLONASE) 50 MCG/ACT nasal spray Place 1 spray into both nostrils daily as needed for allergies or rhinitis.    . hydroxychloroquine (PLAQUENIL) 200 MG tablet Take 200 mg by mouth 2 (two) times daily.     Marland Kitchen lidocaine-prilocaine (EMLA) cream Apply to affected area once (Patient not taking: Reported on 07/06/2018) 30 g 3  . Multiple Vitamin (MULTIVITAMIN WITH MINERALS) TABS tablet Take 1 tablet by mouth daily. 180 tablet 0  . ondansetron (ZOFRAN) 4 MG tablet Take 1 tablet (4 mg total) by mouth 2 (two) times daily as needed for refractory nausea / vomiting. Start on day 3 after cyclophosphamide chemotherapy. (Patient not taking: Reported on 07/06/2018) 20 tablet 1  . pantoprazole (PROTONIX) 40 MG tablet Take 1 tablet (40 mg total) by mouth 2 (two) times daily before a meal. 60 tablet 1  . polyethylene glycol (MIRALAX /  GLYCOLAX) packet Take 17 g by mouth daily.    . predniSONE (DELTASONE) 20 MG tablet Take 3 tablets (60 mg total) by mouth daily. Take on days 1-5 of chemotherapy. (Patient not taking: Reported on 07/06/2018) 15 tablet 5  . predniSONE (DELTASONE) 5 MG tablet Take 5 mg by mouth.    . Pseudoephedrine-APAP 30-325 MG TABS Take 1 tablet by mouth daily as needed (sinuses).     . ranitidine (ZANTAC) 150 MG tablet Take 150 mg by mouth daily as needed for heartburn.     . simvastatin (ZOCOR) 40 MG tablet Take 40 mg by mouth daily.     . traMADol (ULTRAM) 50 MG tablet Take 50 mg by mouth 2 (two) times daily as needed for moderate pain.     . Nutritional Supplements (ENSURE ACTIVE PO) Take by mouth.     No current facility-administered medications for this visit.     Review of Systems  Constitutional: Negative for  chills, diaphoresis, fever, malaise/fatigue and weight loss (stable).       Feels "ok".  HENT: Negative.  Negative for congestion, ear discharge, ear pain, nosebleeds, sinus pain, sore throat and tinnitus.   Eyes: Negative.  Negative for blurred vision, double vision, photophobia, pain, discharge and redness.  Respiratory: Negative.  Negative for cough, hemoptysis and sputum production.   Cardiovascular: Negative.  Negative for chest pain, palpitations, orthopnea, leg swelling and PND.  Gastrointestinal: Negative.  Negative for abdominal pain, blood in stool, constipation, diarrhea, heartburn, melena, nausea (mild) and vomiting.  Genitourinary: Negative.  Negative for dysuria, frequency, hematuria and urgency.       PMH (+) for lupus nephritis. Good urine output.   Musculoskeletal: Negative.  Negative for back pain, falls, joint pain, myalgias and neck pain.  Skin: Positive for rash. Negative for itching.       Facial rash (flare).  Neurological: Negative.  Negative for dizziness, tingling, tremors, sensory change, speech change, focal weakness, weakness and headaches.  Endo/Heme/Allergies:  Negative.  Does not bruise/bleed easily.  Psychiatric/Behavioral: Negative for depression and memory loss. The patient is not nervous/anxious and does not have insomnia.   All other systems reviewed and are negative.  Performance status (ECOG): 1  Vital Signs: BP 133/66 (BP Location: Left Arm, Patient Position: Sitting)   Pulse 97   Temp 98 F (36.7 C) (Tympanic)   Resp 18   Wt 112 lb 2 oz (50.9 kg)   BMI 22.65 kg/m   Physical Exam  Constitutional: She is oriented to person, place, and time and well-developed, well-nourished, and in no distress. No distress.  HENT:  Head: Normocephalic and atraumatic. 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  Mouth/Throat: Oropharynx is clear and moist and mucous membranes are normal. She has dentures. No oropharyngeal exudate.  Wearing a white cap.  Eyes: Pupils are equal, round, and reactive to light. Conjunctivae and EOM are normal. No scleral icterus.  Glasses.  Brown eyes.  Neck: Normal range of motion. Neck supple. No JVD present.  Cardiovascular: Normal rate, regular rhythm, normal heart sounds and intact distal pulses. Exam reveals no gallop and no friction rub.  No murmur heard. Pulmonary/Chest: Breath sounds normal. No respiratory distress. She has no rales.  Abdominal: Soft. Bowel sounds are normal. She exhibits no distension and no mass. There is no abdominal tenderness. There is no rebound and no guarding.  Musculoskeletal: Normal range of motion.        General: No tenderness or edema.  Lymphadenopathy:    She has no cervical adenopathy.    She has no axillary adenopathy.       Right: No supraclavicular adenopathy present.       Left: No supraclavicular adenopathy present.  Neurological: She is alert and oriented to person, place, and time. Gait normal.  Reflex Scores:      Patellar reflexes are 2+ on the right side and 2+ on the left side. No foot drop.  Able to walk on heels.  Skin: Skin is warm and dry. No rash noted.  She is not diaphoretic. No erythema.  Mild facial and upper neck erythema.  Psychiatric: Mood, affect and judgment normal.  Nursing note and vitals reviewed.   Infusion on 06/29/2018  Component Date Value Ref Range Status  . Uric Acid, Serum 06/29/2018 5.7  2.5 - 7.1 mg/dL Final   Performed at [facility], [address] [place], [place] [address]  . LDH 06/29/2018 191  98 - 192 U/L Final  Performed at [facility], [address], [place], [place] [address]  . Magnesium 06/29/2018 1.9  1.7 - 2.4 mg/dL Final   Performed at [facility], [address] [place], [place] [address]  . Sodium 06/29/2018 138  135 - 145 mmol/L Final  . Potassium 06/29/2018 3.5  3.5 - 5.1 mmol/L Final  . Chloride 06/29/2018 105  98 - 111 mmol/L Final  . CO2 06/29/2018 25  22 - 32 mmol/L Final  . Glucose, Bld 06/29/2018 117* 70 - 99 mg/dL Final  . BUN 06/29/2018 26* 8 - 23 mg/dL Final  . Creatinine, Ser 06/29/2018 1.27* 0.44 - 1.00 mg/dL Final  . Calcium 06/29/2018 8.6* 8.9 - 10.3 mg/dL Final  . Total Protein 06/29/2018 6.0* 6.5 - 8.1 g/dL Final  . Albumin 06/29/2018 3.7  3.5 - 5.0 g/dL Final  . AST 06/29/2018 29  15 - 41 U/L Final  . ALT 06/29/2018 15  0 - 44 U/L Final  . Alkaline Phosphatase 06/29/2018 73  38 - 126 U/L Final  . Total Bilirubin 06/29/2018 0.5  0.3 - 1.2 mg/dL Final  . GFR calc non Af Amer 06/29/2018 43* >60 mL/min Final  . GFR calc Af Amer 06/29/2018 50* >60 mL/min Final  . Anion gap 06/29/2018 8  5 - 15 Final   Performed at [facility], [address], [place], [place] [address]  . WBC 06/29/2018 2.7* 4.0 - 10.5 K/uL Final  . RBC 06/29/2018 3.29* 3.87 - 5.11 MIL/uL Final  . Hemoglobin 06/29/2018 10.3* 12.0 - 15.0 g/dL Final  . HCT 06/29/2018 32.6* 36.0 - 46.0 % Final  . MCV 06/29/2018 99.1  80.0 - 100.0 fL Final  . MCH 06/29/2018 31.3  26.0 - 34.0 pg Final  . MCHC 06/29/2018 31.6  30.0 - 36.0 g/dL Final  . RDW 06/29/2018 16.8* 11.5 - 15.5 % Final   . Platelets 06/29/2018 174  150 - 400 K/uL Final  . nRBC 06/29/2018 0.0  0.0 - 0.2 % Final  . Neutrophils Relative % 06/29/2018 37  % Final  . Neutro Abs 06/29/2018 1.0* 1.7 - 7.7 K/uL Final  . Lymphocytes Relative 06/29/2018 30  % Final  . Lymphs Abs 06/29/2018 0.8  0.7 - 4.0 K/uL Final  . Monocytes Relative 06/29/2018 31  % Final  . Monocytes Absolute 06/29/2018 0.8  0.1 - 1.0 K/uL Final  . Eosinophils Relative 06/29/2018 1  % Final  . Eosinophils Absolute 06/29/2018 0.0  0.0 - 0.5 K/uL Final  . Basophils Relative 06/29/2018 1  % Final  . Basophils Absolute 06/29/2018 0.0  0.0 - 0.1 K/uL Final  . Immature Granulocytes 06/29/2018 0  % Final  . Abs Immature Granulocytes 06/29/2018 0.01  0.00 - 0.07 K/uL Final   Performed at [facility], [address] [place], [place] [address]    Assessment:  [name] is a 72 y.o. 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  IPI is 3 (intermediate-high risk), age-adjusted IPI is 2 (low-intermediate risk), revised IPI is 3 (poor), and NCCN-IPI is 5 (intermediate-high risk).  CNS risk is 3 (intermediate: age, LDH, stage).  EGD on 03/15/2018 revealed pathology from the duodenal scalloped lesion and duodenal sweep ulcer with involvement with diffuse large B cell lymphoma.  Random gastric biopsy was negative for lymphoma.  There was mild chronic gastritis with reactive foveolar hyperplasia.  Gastric fundus scar revealed involvement with diffuse large B cell lymphoma.  GE  junction biopsy revealed no lymphoma with squamocolumnar mucosa with mild chronic active inflammation.  The lymphoma has a non-germinal center immunophenotype with a Ki-67 proliferation index of 60% and shows Bcl-2/CMYC co-expression by  immunohistochemical analysis.   Summary of IHC results, neoplastic cells: Positive: PAX-5, CD20(variable), Bcl-2, MUM1, Bcl-6 (variable), CMYC.  Negative: CD10, CD5, CD30, CD138, Cyclin-D1, ALK-1,  CD3.  EBV (ISH): Negative.  FISH for MYC gene rearrangement (8q24 gene region) was negative.  PET scan on 04/06/2018 revealed enlarged and hypermetabolic supraclavicular, mediastinal, abdominal and pelvic adenopathy. Additional smaller hypermetabolic lymph nodes identified within the left cervical lymph node chain. Largest lymph node was a 5.7 cm subcarinal lymph node (SUV 26.78).  Bone marrow aspirate and biopsy on 04/12/2018 revealed a slightly hypercellular bone marrow for age with trilineage hematopoiesis.  There were dyspoietic changes associated with the granulocyte series.  Flow cytometry revealed a predominance of T lymphocytes with relative abundance of CD8 + cells.  There was no significant B cell population.  Cytogenetics are pending.  Echo on 04/04/2018 revealed an EF of 55-60%.  She presented with a normocytic anemia.  She was admitted x 2 (12/2017 and 01/2018) with rectal bleeding.  She is intolerant of oral iron.  Diet is modest.  EGD on 12/30/2017 revealed non bleeding gastric erosions in the antrum.  Colonoscopy on 12/30/2017 revealed numerous polyps (one 5 mm in ascending colon, two 7 mm polyps in transverse colon, five 5-7 mm polyps in the descending colon, one 7 mm polyp in the sigmoid colon, one 8 mm polyp in the recto-sigmoid colon, and one 19 mm polyp in the rectum). 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    She developed hematochezia post colonoscopy.  Flexible sigmoidoscopy on 12/31/2017 revealed a rectal ulcer at the site of the polypectomy and blood in the rectum.  Clips were placed.  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  Work-up on 04/03/2018 revealed a hematocrit of 32.5, hemoglobin 10.7, MCV 91.7, platelets 207,000, WBC 1800 with an ANC of 600.  Uric acid was 8.8.  LDH was 241 (98-192) and beta2-microglobulin  5.8 (0.6-2.4).   Negative studies included:  hepatitis B core antibody total, hepatitis B surface antigen, hepatitis C antibody, HIV testing.     She has required 4 units of PRBCs (3 units in 12/2017 and 1 unit in 01/2018).  Ferritin has been followed: 30 on 12/29/2017, 93 on 02/13/2018, 57 on 03/07/2018, and 38 on 04/03/2018.  Labs on 12/29/2017 revealed the following:  B12 was 254 (low normal).  She has been on oral B12 since 03/07/2018.  B12 was 1524 on 04/03/2018.  Folate 6.3 (> 5.9) on 12/29/2017 and 55.8 on 04/03/2018.    She is s/p 3 cycles of mini-RCHOP (04/24/2018 - 06/07/2018) with Udencya support.  Cycle #1 platelet nadir was 82,000.  She has chronic renal insufficiency.  Creatinine was 1.42 (CrCl 25 ml/min) on 04/03/2018.  She has lupus (diagnosed 1982). She is on prednisone 5 mg a day and Plaquenil.   Symptomatically, she feels "ok".  Exam reveals facial and neck erythema (flare of typical rash). WBC is 2700 (ANC 1000).  Plan: 1. Labs today:  CBC with diff, CMP, LDH, uric acid.  2. DLBCL s/p 3 cycles of mini-RCHOP with Udencya support. Discuss low counts today.  Discuss no plan to increase chemotherapy dosing given today's counts and more difficult time with last cycle. Postpone cycle #4 as  counts have not recovered.. Discuss restaging CT scans. Discuss symptom management.  Patient has antiemetics and pain medications at home to use on a PRN basis. Patient  advising that the  prescribed interventions are adequate at this point. Continue all medications as previously prescribed.  3. Renal insufficiency  BUN 26 and creatinine 1.27 (CrCl 29.5 mL/min) today.  She is being followed by local nephrologist.  Encouraged oral fluid intake. 4. Hypercalcemia  Calcium level 8.6.  No intervention needed. 5. Leukopenia  WBC 2700 (ANC 1000).   Hemoglobin 10.3, hematocrit 32.6, MCV 99.1, and platelets 174,000.  Suspect low ANC secondary to chemotherapy and possible flare of lupus  (rash).  Discuss waiting an additional week for counts to recover.  Increase in monocyte count suggests early count recovery.  Continue routine lab monitoring. 6. TLS monitoring  LDH 191, uric acid 5.7, K+ 3.5  No evidence of TLS.  Encouraged to maintain adequate hydration.  Continue prophylactic allopurinol 50 mg a day. 7. B12 deficiency  B12 level normal on 04/03/2018 (on oral B12 since 03/07/2018).  Continue oral B12 1000 mcg supplementation daily. 8. Schedule chest, abdomen, and pelvic CT scan on 06/23/2018. 9. RTC on 07/06/2018 for MD assessment, labs (CBC with diff, CMP, LDH, uric acid), and cycle #4 mini-RCHOP.   Lequita Asal, MD  06/29/2018, 2:53 PM

## 2018-06-29 NOTE — Progress Notes (Signed)
Pt in for follow up, denies any difficulties or concerns today. 

## 2018-06-30 ENCOUNTER — Inpatient Hospital Stay: Payer: Medicare Other

## 2018-07-03 ENCOUNTER — Ambulatory Visit
Admission: RE | Admit: 2018-07-03 | Discharge: 2018-07-03 | Disposition: A | Discharge status: Home / Self Care | Payer: Medicare Other | Source: Ambulatory Visit | Attending: Hematology and Oncology | Admitting: Hematology and Oncology

## 2018-07-03 DIAGNOSIS — C8339 Diffuse large B-cell lymphoma, extranodal and solid organ sites: Secondary | ICD-10-CM | POA: Diagnosis not present

## 2018-07-06 ENCOUNTER — Inpatient Hospital Stay: Payer: Medicare Other

## 2018-07-06 ENCOUNTER — Other Ambulatory Visit: Payer: Self-pay

## 2018-07-06 ENCOUNTER — Inpatient Hospital Stay (HOSPITAL_BASED_OUTPATIENT_CLINIC_OR_DEPARTMENT_OTHER): Payer: Medicare Other | Admitting: Hematology and Oncology

## 2018-07-06 VITALS — BP 143/81 | HR 92 | Temp 98.7°F | Resp 18 | Wt 114.0 lb

## 2018-07-06 DIAGNOSIS — C8339 Diffuse large B-cell lymphoma, extranodal and solid organ sites: Secondary | ICD-10-CM | POA: Diagnosis not present

## 2018-07-06 DIAGNOSIS — D72819 Decreased white blood cell count, unspecified: Secondary | ICD-10-CM | POA: Diagnosis not present

## 2018-07-06 DIAGNOSIS — E538 Deficiency of other specified B group vitamins: Secondary | ICD-10-CM

## 2018-07-06 DIAGNOSIS — C8338 Diffuse large B-cell lymphoma, lymph nodes of multiple sites: Secondary | ICD-10-CM | POA: Diagnosis not present

## 2018-07-06 LAB — CBC WITH DIFFERENTIAL/PLATELET
Abs Immature Granulocytes: 0.01 10*3/uL (ref 0.00–0.07)
Basophils Absolute: 0 10*3/uL (ref 0.0–0.1)
Basophils Relative: 0 %
Eosinophils Absolute: 0.1 10*3/uL (ref 0.0–0.5)
Eosinophils Relative: 4 %
HCT: 31.9 % — ABNORMAL LOW (ref 36.0–46.0)
Hemoglobin: 10.1 g/dL — ABNORMAL LOW (ref 12.0–15.0)
Immature Granulocytes: 0 %
Lymphocytes Relative: 40 %
Lymphs Abs: 1 10*3/uL (ref 0.7–4.0)
MCH: 31.6 pg (ref 26.0–34.0)
MCHC: 31.7 g/dL (ref 30.0–36.0)
MCV: 99.7 fL (ref 80.0–100.0)
Monocytes Absolute: 1.4 10*3/uL — ABNORMAL HIGH (ref 0.1–1.0)
Monocytes Relative: 55 %
Neutro Abs: 0 10*3/uL — ABNORMAL LOW (ref 1.7–7.7)
Neutrophils Relative %: 1 %
Platelets: 223 10*3/uL (ref 150–400)
RBC: 3.2 MIL/uL — ABNORMAL LOW (ref 3.87–5.11)
RDW: 15.9 % — ABNORMAL HIGH (ref 11.5–15.5)
WBC: 2.5 10*3/uL — ABNORMAL LOW (ref 4.0–10.5)
nRBC: 0 % (ref 0.0–0.2)

## 2018-07-06 LAB — COMPREHENSIVE METABOLIC PANEL
ALT: 14 U/L (ref 0–44)
AST: 26 U/L (ref 15–41)
Albumin: 3.5 g/dL (ref 3.5–5.0)
Alkaline Phosphatase: 71 U/L (ref 38–126)
Anion gap: 8 (ref 5–15)
BUN: 18 mg/dL (ref 8–23)
CO2: 26 mmol/L (ref 22–32)
Calcium: 8.5 mg/dL — ABNORMAL LOW (ref 8.9–10.3)
Chloride: 106 mmol/L (ref 98–111)
Creatinine, Ser: 1.21 mg/dL — ABNORMAL HIGH (ref 0.44–1.00)
GFR calc Af Amer: 52 mL/min — ABNORMAL LOW (ref >60)
GFR calc non Af Amer: 45 mL/min — ABNORMAL LOW (ref >60)
Glucose, Bld: 101 mg/dL — ABNORMAL HIGH (ref 70–99)
Potassium: 3.6 mmol/L (ref 3.5–5.1)
Sodium: 140 mmol/L (ref 135–145)
Total Bilirubin: 0.4 mg/dL (ref 0.3–1.2)
Total Protein: 5.8 g/dL — ABNORMAL LOW (ref 6.5–8.1)

## 2018-07-06 LAB — LACTATE DEHYDROGENASE: LDH: 195 U/L — ABNORMAL HIGH (ref 98–192)

## 2018-07-06 LAB — URIC ACID: Uric Acid, Serum: 4.8 mg/dL (ref 2.5–7.1)

## 2018-07-06 MED ORDER — HEPARIN SOD (PORK) LOCK FLUSH 100 UNIT/ML IV SOLN
500.0000 [IU] | Freq: Once | INTRAVENOUS | Status: AC
  Administered 2018-07-06: 500 [IU] via INTRAVENOUS
  Filled 2018-07-06: qty 5

## 2018-07-06 MED ORDER — SODIUM CHLORIDE 0.9% FLUSH
10.0000 mL | Freq: Once | INTRAVENOUS | Status: AC
  Administered 2018-07-06: 10 mL via INTRAVENOUS
  Filled 2018-07-06: qty 10

## 2018-07-06 NOTE — Progress Notes (Signed)
Here for follow up. Since last visit 06/29/18 has gained 2 lbs- stated drinking one ensure qd. Per pt" I feel good "

## 2018-07-06 NOTE — Progress Notes (Signed)
[facility] day:  07/06/2018  Chief Complaint: [name] is a 72 y.o. 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  HPI: The patient was last seen in the medical oncology clinic on 06/29/2018.  At that time, she felt "ok".  She denied any B symptoms.  She noted the last cycle of chemotherapy "kicked her in the butt".  WBC was 2700 with an [place] of 1000.  Monocyte count was 31% indicating a recovering marrow.  Decision was made to postpone chemotherapy x 1 week.  Chest, abdomen, and pelvic CT on 07/03/2018 revealed marked interval response to therapy. Previously identified relatively bulky lymphadenopathy in the chest, abdomen, and pelvis had resolved with no pathologically enlarged lymphadenopathy.  There was no new or progressive interval findings.  During the interim, she has had no problems.  She continues to have her facial rash.  She denies any fevers, sweats or weight loss.  She denies any change in urine output.   Past Medical History:  Diagnosis Date  . [facility])    Stomach cancer  . Chronic kidney disease   . GERD (gastroesophageal reflux disease)   . HTN (hypertension)   . Hyperlipidemia   . Lupus ([place])   . S/P angioplasty with stent     Past Surgical History:  Procedure Laterality Date  . CARDIAC SURGERY    . CESAREAN SECTION    . COLONOSCOPY N/A 12/30/2017   Procedure: COLONOSCOPY;  Surgeon: Lin Landsman, MD;  Location: [facility] ENDOSCOPY;  Service: Gastroenterology;  Laterality: N/A;  . CORONARY ARTERY BYPASS GRAFT  2003  . ESOPHAGOGASTRODUODENOSCOPY N/A 12/30/2017   Procedure: ESOPHAGOGASTRODUODENOSCOPY (EGD);  Surgeon: Lin Landsman, MD;  Location: [facility] ENDOSCOPY;  Service: Gastroenterology;  Laterality: N/A;  . ESOPHAGOGASTRODUODENOSCOPY Left 02/05/2018   Procedure: ESOPHAGOGASTRODUODENOSCOPY (EGD);  Surgeon: Virgel Manifold, MD;  Location:  [facility] ENDOSCOPY;  Service: Endoscopy;  Laterality: Left;  . ESOPHAGOGASTRODUODENOSCOPY (EGD) WITH PROPOFOL N/A 03/15/2018   Procedure: ESOPHAGOGASTRODUODENOSCOPY (EGD) WITH PROPOFOL;  Surgeon: Virgel Manifold, MD;  Location: [facility] ENDOSCOPY;  Service: Endoscopy;  Laterality: N/A;  . FLEXIBLE SIGMOIDOSCOPY N/A 12/31/2017   Procedure: FLEXIBLE SIGMOIDOSCOPY;  Surgeon: Jonathon Bellows, MD;  Location: [facility] ENDOSCOPY;  Service: Gastroenterology;  Laterality: N/A;  . PORTA CATH INSERTION N/A 04/19/2018   Procedure: PORTA CATH INSERTION;  Surgeon: Algernon Huxley, MD;  Location: [facility];  Service: Cardiovascular;  Laterality: N/A;    Family History  Problem Relation Age of Onset  . Hypertension Mother   . Stroke Mother   . Vascular Disease Mother   . Hypertension Sister     Social History:  reports that she quit smoking about 16 years ago. Her smoking use included cigarettes. She has a 10.00 pack-year smoking history. She has never used smokeless tobacco. She reports previous alcohol use. She reports that she does not use drugs.  She has smoked 1/2 pack/day x 20 years.  She stopped smoking in 2000.  She denies any exposure to radiation or toxins.  She is from [place].  She moved to the [place] 52 years ago after marrying her husband.  She was in the [employer].  The patient is accompanied by her husband today.  Allergies:  Allergies  Allergen Reactions  . Penicillins Other (See Comments)    Pass out Has patient had a PCN reaction causing immediate rash, facial/tongue/throat swelling, SOB or lightheadedness with  hypotension: Yes Has patient had a PCN reaction causing severe rash involving mucus membranes or skin necrosis: No Has patient had a PCN reaction that required hospitalization: No Has patient had a PCN reaction occurring within the last 10 years: No If all of the above answers are "NO", then may proceed with Cephalosporin use.   . Sulfa Antibiotics Nausea Only  . Sulfasalazine  Nausea And Vomiting       . Ace Inhibitors Nausea Only  . Losartan Anxiety  . Spironolactone Other (See Comments)    Felt poorly but couldn't clarify     Current Medications: Current Outpatient Medications  Medication Sig Dispense Refill  . acetaminophen (TYLENOL) 500 MG tablet Take 1,000 mg by mouth 2 (two) times daily as needed for moderate pain or headache.    . alendronate (FOSAMAX) 70 MG tablet Take 70 mg by mouth every Saturday.     Marland Kitchen allopurinol (ZYLOPRIM) 100 MG tablet TAKE 1/2 TABLET BY MOUTH DAILY 15 tablet 3  . amLODipine (NORVASC) 5 MG tablet Take 5 mg by mouth daily.     . clotrimazole-betamethasone (LOTRISONE) cream Apply 1 application topically 2 (two) times daily as needed (rash).     . feeding supplement, ENSURE ENLIVE, (ENSURE ENLIVE) LIQD Take 237 mLs by mouth 2 (two) times daily between meals. (Patient taking differently: Take 237 mLs by mouth daily. ) 90 Bottle 0  . ferrous sulfate 325 (65 FE) MG tablet Take 1 tablet (325 mg total) by mouth 2 (two) times daily with a meal. 60 tablet 3  . fluticasone (FLONASE) 50 MCG/ACT nasal spray Place 1 spray into both nostrils daily as needed for allergies or rhinitis.    . hydroxychloroquine (PLAQUENIL) 200 MG tablet Take 200 mg by mouth 2 (two) times daily.     . Multiple Vitamin (MULTIVITAMIN WITH MINERALS) TABS tablet Take 1 tablet by mouth daily. 180 tablet 0  . Nutritional Supplements (ENSURE ACTIVE PO) Take by mouth.    . pantoprazole (PROTONIX) 40 MG tablet Take 1 tablet (40 mg total) by mouth 2 (two) times daily before a meal. 60 tablet 1  . polyethylene glycol (MIRALAX / GLYCOLAX) packet Take 17 g by mouth daily.    . predniSONE (DELTASONE) 5 MG tablet Take 5 mg by mouth.    . Pseudoephedrine-APAP 30-325 MG TABS Take 1 tablet by mouth daily as needed (sinuses).     . ranitidine (ZANTAC) 150 MG tablet Take 150 mg by mouth daily as needed for heartburn.     . simvastatin (ZOCOR) 40 MG tablet Take 40 mg by mouth daily.      . traMADol (ULTRAM) 50 MG tablet Take 50 mg by mouth 2 (two) times daily as needed for moderate pain.     Marland Kitchen lidocaine-prilocaine (EMLA) cream Apply to affected area once (Patient not taking: Reported on 07/06/2018) 30 g 3  . ondansetron (ZOFRAN) 4 MG tablet Take 1 tablet (4 mg total) by mouth 2 (two) times daily as needed for refractory nausea / vomiting. Start on day 3 after cyclophosphamide chemotherapy. (Patient not taking: Reported on 07/06/2018) 20 tablet 1  . predniSONE (DELTASONE) 20 MG tablet Take 3 tablets (60 mg total) by mouth daily. Take on days 1-5 of chemotherapy. (Patient not taking: Reported on 07/06/2018) 15 tablet 5   No current facility-administered medications for this visit.    Facility-Administered Medications Ordered in Other Visits  Medication Dose Route Frequency Provider Last Rate Last Dose  . heparin lock flush 100 unit/mL  500  Units Intravenous Once Lequita Asal, MD        Review of Systems  Constitutional: Negative.  Negative for chills, diaphoresis, fever, malaise/fatigue and weight loss (up 2 pounds).  HENT: Negative.  Negative for congestion, ear discharge, ear pain, nosebleeds, sinus pain, sore throat and tinnitus.   Eyes: Negative.  Negative for blurred vision, double vision, photophobia, pain, discharge and redness.  Respiratory: Negative.  Negative for cough, hemoptysis, sputum production and shortness of breath.   Cardiovascular: Negative.  Negative for chest pain, palpitations, orthopnea, leg swelling and PND.  Gastrointestinal: Negative for abdominal pain, blood in stool, constipation, diarrhea, melena, nausea and vomiting.  Genitourinary: Negative.  Negative for dysuria, frequency and hematuria.       PMH (+) for lupus nephritis. Good urine output.   Musculoskeletal: Negative.  Negative for back pain, falls, joint pain, myalgias and neck pain.  Skin: Negative for itching and rash.       Typical face and chest rash.  Neurological: Negative for  dizziness, tingling, tremors, sensory change, speech change, focal weakness, weakness and headaches.  Endo/Heme/Allergies: Does not bruise/bleed easily.  Psychiatric/Behavioral: Negative for depression and memory loss. The patient is not nervous/anxious and does not have insomnia.   All other systems reviewed and are negative.  Performance status (ECOG): 1 - Symptomatic but completely ambulatory  Vital Signs: BP (!) 143/81 (BP Location: Left Arm, Patient Position: Sitting)   Pulse 92   Temp 98.7 F (37.1 C) (Tympanic)   Resp 18   Wt 114 lb (51.7 kg)   BMI 23.03 kg/m   Physical Exam  Constitutional: She is oriented to person, place, and time and well-developed, well-nourished, and in no distress. No distress.  HENT:  Head: Normocephalic and atraumatic. 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  Mouth/Throat: Oropharynx is clear and moist and mucous membranes are normal. She has dentures. No oropharyngeal exudate.  Wearing a white pom-pom cap.  Eyes: Pupils are equal, round, and reactive to light. Conjunctivae and EOM are normal. Left eye exhibits no discharge. No scleral icterus.  Glasses.  Brown eyes.  Neck: Normal range of motion. Neck supple. No JVD present.  Cardiovascular: Normal rate, regular rhythm, normal heart sounds and intact distal pulses. Exam reveals no gallop and no friction rub.  No murmur heard. Pulmonary/Chest: Effort normal and breath sounds normal. No respiratory distress. She has no wheezes. She has no rales.  Abdominal: Soft. She exhibits no distension and no mass. There is no abdominal tenderness. There is no rebound and no guarding.  Musculoskeletal: Normal range of motion.        General: No tenderness or edema.  Lymphadenopathy:    She has no cervical adenopathy.    She has no axillary adenopathy.       Right: No supraclavicular adenopathy present.       Left: No supraclavicular adenopathy present.  Neurological: She is alert and oriented to  person, place, and time. Gait normal.  Reflex Scores:      Patellar reflexes are 2+ on the right side and 2+ on the left side. No foot drop.  Skin: Skin is warm and dry. Rash noted. She is not diaphoretic. No erythema.  Facial and neck erythema.  Psychiatric: Mood, affect and judgment normal.  Nursing note and vitals reviewed.   Infusion on 07/06/2018  Component Date Value Ref Range Status  . LDH 07/06/2018 195* 98 - 192 U/L Final   Performed at [facility], [place], [place]  [address]  . Sodium 07/06/2018 140  135 - 145 mmol/L Final  . Potassium 07/06/2018 3.6  3.5 - 5.1 mmol/L Final  . Chloride 07/06/2018 106  98 - 111 mmol/L Final  . CO2 07/06/2018 26  22 - 32 mmol/L Final  . Glucose, Bld 07/06/2018 101* 70 - 99 mg/dL Final  . BUN 07/06/2018 18  8 - 23 mg/dL Final  . Creatinine, Ser 07/06/2018 1.21* 0.44 - 1.00 mg/dL Final  . Calcium 07/06/2018 8.5* 8.9 - 10.3 mg/dL Final  . Total Protein 07/06/2018 5.8* 6.5 - 8.1 g/dL Final  . Albumin 07/06/2018 3.5  3.5 - 5.0 g/dL Final  . AST 07/06/2018 26  15 - 41 U/L Final  . ALT 07/06/2018 14  0 - 44 U/L Final  . Alkaline Phosphatase 07/06/2018 71  38 - 126 U/L Final  . Total Bilirubin 07/06/2018 0.4  0.3 - 1.2 mg/dL Final  . GFR calc non Af Amer 07/06/2018 45* >60 mL/min Final  . GFR calc Af Amer 07/06/2018 52* >60 mL/min Final  . Anion gap 07/06/2018 8  5 - 15 Final   Performed at [facility], [address], [place], [place] [address]  . WBC 07/06/2018 2.5* 4.0 - 10.5 K/uL Final  . RBC 07/06/2018 3.20* 3.87 - 5.11 MIL/uL Final  . Hemoglobin 07/06/2018 10.1* 12.0 - 15.0 g/dL Final  . HCT 07/06/2018 31.9* 36.0 - 46.0 % Final  . MCV 07/06/2018 99.7  80.0 - 100.0 fL Final  . MCH 07/06/2018 31.6  26.0 - 34.0 pg Final  . MCHC 07/06/2018 31.7  30.0 - 36.0 g/dL Final  . RDW 07/06/2018 15.9* 11.5 - 15.5 % Final  . Platelets 07/06/2018 223  150 - 400 K/uL Final  . nRBC 07/06/2018 0.0  0.0 - 0.2 % Final    Performed at [facility], [address], [place], [place] [address]  . Neutrophils Relative % 07/06/2018 PENDING  % Incomplete  . Neutro Abs 07/06/2018 PENDING  1.7 - 7.7 K/uL Incomplete  . Band Neutrophils 07/06/2018 PENDING  % Incomplete  . Lymphocytes Relative 07/06/2018 PENDING  % Incomplete  . Lymphs Abs 07/06/2018 PENDING  0.7 - 4.0 K/uL Incomplete  . Monocytes Relative 07/06/2018 PENDING  % Incomplete  . Monocytes Absolute 07/06/2018 PENDING  0.1 - 1.0 K/uL Incomplete  . Eosinophils Relative 07/06/2018 PENDING  % Incomplete  . Eosinophils Absolute 07/06/2018 PENDING  0.0 - 0.5 K/uL Incomplete  . Basophils Relative 07/06/2018 PENDING  % Incomplete  . Basophils Absolute 07/06/2018 PENDING  0.0 - 0.1 K/uL Incomplete  . WBC Morphology 07/06/2018 PENDING   Incomplete  . RBC Morphology 07/06/2018 PENDING   Incomplete  . Smear Review 07/06/2018 PENDING   Incomplete  . Other 07/06/2018 PENDING  % Incomplete  . nRBC 07/06/2018 PENDING  0 /100 WBC Incomplete  . Metamyelocytes Relative 07/06/2018 PENDING  % Incomplete  . Myelocytes 07/06/2018 PENDING  % Incomplete  . Promyelocytes Relative 07/06/2018 PENDING  % Incomplete  . Blasts 07/06/2018 PENDING  % Incomplete    Assessment:  [name] is a 72 y.o. 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  IPI is 3 (intermediate-high risk), age-adjusted IPI is 2 (low-intermediate risk), revised IPI is 3 (poor), and NCCN-IPI is 5 (intermediate-high risk).  CNS risk is 3 (intermediate: age, LDH, stage).  EGD on 03/15/2018 revealed pathology from the duodenal scalloped lesion and duodenal sweep ulcer with involvement with diffuse large B cell lymphoma.  Random gastric biopsy was negative for lymphoma.  There was mild chronic gastritis with reactive foveolar hyperplasia.  Gastric fundus scar revealed involvement with diffuse large B cell lymphoma.  GE junction biopsy revealed no lymphoma with  squamocolumnar mucosa with mild chronic active inflammation.  The lymphoma has a non-germinal center immunophenotype with a Ki-67 proliferation index of 60% and shows Bcl-2/CMYC co-expression by  immunohistochemical analysis.   Summary of IHC results, neoplastic cells: Positive: PAX-5, CD20(variable), Bcl-2, MUM1, Bcl-6 (variable), CMYC.  Negative: CD10, CD5, CD30, CD138, Cyclin-D1, ALK-1, CD3.  EBV (ISH): Negative.  FISH for MYC gene rearrangement (8q24 gene region) was negative.  PET scan on 04/06/2018 revealed enlarged and hypermetabolic supraclavicular, mediastinal, abdominal and pelvic adenopathy. Additional smaller hypermetabolic lymph nodes identified within the left cervical lymph node chain. Largest lymph node was a 5.7 cm subcarinal lymph node (SUV 26.78).  Bone marrow aspirate and biopsy on 04/12/2018 revealed a slightly hypercellular bone marrow for age with trilineage hematopoiesis.  There were dyspoietic changes associated with the granulocyte series.  Flow cytometry revealed a predominance of T lymphocytes with relative abundance of CD8 + cells.  There was no significant B cell population.  Cytogenetics are pending.  Echo on 04/04/2018 revealed an EF of 55-60%.  She presented with a normocytic anemia.  She was admitted x 2 (12/2017 and 01/2018) with rectal bleeding.  She is intolerant of oral iron.  Diet is modest.  EGD on 12/30/2017 revealed non bleeding gastric erosions in the antrum.  Colonoscopy on 12/30/2017 revealed numerous polyps (one 5 mm in ascending colon, two 7 mm polyps in transverse colon, five 5-7 mm polyps in the descending colon, one 7 mm polyp in the sigmoid colon, one 8 mm polyp in the recto-sigmoid colon, and one 19 mm polyp in the rectum). 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    She developed hematochezia post colonoscopy.  Flexible sigmoidoscopy on 12/31/2017 revealed a rectal ulcer at the site of the polypectomy and blood in  the rectum.  Clips were placed.  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  Work-up on 04/03/2018 revealed a hematocrit of 32.5, hemoglobin 10.7, MCV 91.7, platelets 207,000, WBC 1800 with an ANC of 600.  Uric acid was 8.8.  LDH was 241 (98-192) and beta2-microglobulin  5.8 (0.6-2.4).  Negative studies included:  hepatitis B core antibody total, hepatitis B surface antigen, hepatitis C antibody, HIV testing.     She has required 4 units of PRBCs (3 units in 12/2017 and 1 unit in 01/2018).  Ferritin has been followed: 30 on 12/29/2017, 93 on 02/13/2018, 57 on 03/07/2018, and 38 on 04/03/2018.  Labs on 12/29/2017 revealed the following:  B12 was 254 (low normal).  She has been on oral B12 since 03/07/2018.  B12 was 1524 on 04/03/2018.  Folate 6.3 (> 5.9) on 12/29/2017 and 55.8 on 04/03/2018.    She is s/p 3 cycles of mini-RCHOP (04/24/2018 - 06/07/2018) with Udencya support.  Cycle #1 platelet nadir was 82,000.  Chest, abdomen, and pelvic CT on 07/03/2018 revealed marked interval response to therapy. Previously identified relatively bulky lymphadenopathy in the chest, abdomen, and pelvis had resolved with no pathologically enlarged lymphadenopathy.  There was no new or progressive interval findings.  She has chronic renal insufficiency.  Creatinine was 1.42 (CrCl 25 ml/min) on 04/03/2018.  She has lupus (diagnosed 1982). She is on prednisone 5 mg a day and Plaquenil.  Symptomatically, she denies any complaints.  She has a persistent facial and neck rash.  ANC is 0.  Plan: 1. Labs today:  CBC with diff, CMP, LDH, uric acid. 2. DLBCL S/p 3 cycles of mini-RCHOP Restaging CT personally reviewed.  Agree with radiology.  No pathologically enlarged lymph nodes. Discuss need to complete 6 cycles of chemotherapy with restaging PET scan after completion. Hold chemotherapy until counts recover. 3. Renal  insufficiency  BUN 19 and creatinine 1.21 (CrCl 37.2 mL/min) today.  Patient off of thiazide diuretics.  She is being followed by local nephrologist.  Encourage oral fluid intake. 4. Leukopenia  Patient asymptomatic.  ANC dropping, now 0.  Review fever and neutropenia precautions.  Discuss rechecking counts later in week.  Contact Dr Jefm Bryant- ? etiology secondary to lupus (done).   Discuss plan for bone marrow if counts do not recover. 5. TLS monitoring  LDH 195, uric acid 4.8, K+ 3.6  No evidence of TLS.  Continue good hydration.  Continues prophylactic allopurinol 50 mg a day. 6. B12 deficiency  B12 level normal on 04/03/2018 (on oral B12 since 03/07/2018).  Continue oral B12 1000 mcg supplementation daily. 7. RTC on Tuesday for CBC with diff. 8. RTC on 07/13/2018 for MD assessment, labs (CBC with diff, CMP, LDH, uric acid), cycle #4 mini-RCHOP + Udencya support.   Lequita Asal, MD  07/06/2018, 8:47 AM

## 2018-07-11 ENCOUNTER — Inpatient Hospital Stay: Payer: Medicare Other

## 2018-07-11 DIAGNOSIS — C8338 Diffuse large B-cell lymphoma, lymph nodes of multiple sites: Secondary | ICD-10-CM | POA: Diagnosis not present

## 2018-07-11 DIAGNOSIS — C8339 Diffuse large B-cell lymphoma, extranodal and solid organ sites: Secondary | ICD-10-CM

## 2018-07-11 LAB — CBC WITH DIFFERENTIAL/PLATELET
Abs Immature Granulocytes: 0.03 10*3/uL (ref 0.00–0.07)
Basophils Absolute: 0 10*3/uL (ref 0.0–0.1)
Basophils Relative: 1 %
Eosinophils Absolute: 0.1 10*3/uL (ref 0.0–0.5)
Eosinophils Relative: 4 %
HCT: 34 % — ABNORMAL LOW (ref 36.0–46.0)
Hemoglobin: 10.4 g/dL — ABNORMAL LOW (ref 12.0–15.0)
Immature Granulocytes: 2 %
Lymphocytes Relative: 43 %
Lymphs Abs: 0.8 10*3/uL (ref 0.7–4.0)
MCH: 31 pg (ref 26.0–34.0)
MCHC: 30.6 g/dL (ref 30.0–36.0)
MCV: 101.2 fL — ABNORMAL HIGH (ref 80.0–100.0)
Monocytes Absolute: 0.8 10*3/uL (ref 0.1–1.0)
Monocytes Relative: 44 %
Neutro Abs: 0.1 10*3/uL — ABNORMAL LOW (ref 1.7–7.7)
Neutrophils Relative %: 6 %
Platelets: 198 10*3/uL (ref 150–400)
RBC: 3.36 MIL/uL — ABNORMAL LOW (ref 3.87–5.11)
RDW: 14.9 % (ref 11.5–15.5)
WBC: 1.9 10*3/uL — ABNORMAL LOW (ref 4.0–10.5)
nRBC: 0 % (ref 0.0–0.2)

## 2018-07-12 ENCOUNTER — Encounter: Payer: Self-pay | Admitting: Hematology and Oncology

## 2018-07-12 DIAGNOSIS — D72819 Decreased white blood cell count, unspecified: Secondary | ICD-10-CM | POA: Insufficient documentation

## 2018-07-13 ENCOUNTER — Other Ambulatory Visit: Payer: Self-pay | Admitting: Hematology and Oncology

## 2018-07-13 ENCOUNTER — Other Ambulatory Visit: Payer: Self-pay

## 2018-07-13 ENCOUNTER — Encounter: Payer: Self-pay | Admitting: Hematology and Oncology

## 2018-07-13 ENCOUNTER — Inpatient Hospital Stay: Payer: Medicare Other

## 2018-07-13 ENCOUNTER — Inpatient Hospital Stay (HOSPITAL_BASED_OUTPATIENT_CLINIC_OR_DEPARTMENT_OTHER): Payer: Medicare Other | Admitting: Hematology and Oncology

## 2018-07-13 VITALS — BP 142/83 | HR 85 | Temp 98.6°F | Resp 18 | Wt 118.9 lb

## 2018-07-13 DIAGNOSIS — C83398 Diffuse large b-cell lymphoma of other extranodal and solid organ sites: Secondary | ICD-10-CM

## 2018-07-13 DIAGNOSIS — R600 Localized edema: Secondary | ICD-10-CM | POA: Diagnosis not present

## 2018-07-13 DIAGNOSIS — D72819 Decreased white blood cell count, unspecified: Secondary | ICD-10-CM

## 2018-07-13 DIAGNOSIS — E538 Deficiency of other specified B group vitamins: Secondary | ICD-10-CM

## 2018-07-13 DIAGNOSIS — C8339 Diffuse large B-cell lymphoma, extranodal and solid organ sites: Secondary | ICD-10-CM | POA: Diagnosis not present

## 2018-07-13 DIAGNOSIS — M25471 Effusion, right ankle: Secondary | ICD-10-CM

## 2018-07-13 DIAGNOSIS — M25472 Effusion, left ankle: Secondary | ICD-10-CM

## 2018-07-13 DIAGNOSIS — D539 Nutritional anemia, unspecified: Secondary | ICD-10-CM

## 2018-07-13 DIAGNOSIS — C8338 Diffuse large B-cell lymphoma, lymph nodes of multiple sites: Secondary | ICD-10-CM | POA: Diagnosis not present

## 2018-07-13 LAB — COMPREHENSIVE METABOLIC PANEL
ALT: 13 U/L (ref 0–44)
AST: 27 U/L (ref 15–41)
Albumin: 3.2 g/dL — ABNORMAL LOW (ref 3.5–5.0)
Alkaline Phosphatase: 62 U/L (ref 38–126)
Anion gap: 8 (ref 5–15)
BUN: 23 mg/dL (ref 8–23)
CO2: 25 mmol/L (ref 22–32)
Calcium: 8.5 mg/dL — ABNORMAL LOW (ref 8.9–10.3)
Chloride: 108 mmol/L (ref 98–111)
Creatinine, Ser: 1.15 mg/dL — ABNORMAL HIGH (ref 0.44–1.00)
GFR calc Af Amer: 56 mL/min — ABNORMAL LOW (ref >60)
GFR calc non Af Amer: 48 mL/min — ABNORMAL LOW (ref >60)
Glucose, Bld: 117 mg/dL — ABNORMAL HIGH (ref 70–99)
Potassium: 3.5 mmol/L (ref 3.5–5.1)
Sodium: 141 mmol/L (ref 135–145)
Total Bilirubin: 0.3 mg/dL (ref 0.3–1.2)
Total Protein: 5.7 g/dL — ABNORMAL LOW (ref 6.5–8.1)

## 2018-07-13 LAB — VITAMIN B12: Vitamin B-12: 1957 pg/mL — ABNORMAL HIGH (ref 180–914)

## 2018-07-13 LAB — CBC WITH DIFFERENTIAL/PLATELET
Abs Immature Granulocytes: 0.06 10*3/uL (ref 0.00–0.07)
Basophils Absolute: 0 10*3/uL (ref 0.0–0.1)
Basophils Relative: 1 %
Eosinophils Absolute: 0.2 10*3/uL (ref 0.0–0.5)
Eosinophils Relative: 4 %
HCT: 31.9 % — ABNORMAL LOW (ref 36.0–46.0)
Hemoglobin: 9.8 g/dL — ABNORMAL LOW (ref 12.0–15.0)
Immature Granulocytes: 2 %
Lymphocytes Relative: 21 %
Lymphs Abs: 0.9 10*3/uL (ref 0.7–4.0)
MCH: 30.9 pg (ref 26.0–34.0)
MCHC: 30.7 g/dL (ref 30.0–36.0)
MCV: 100.6 fL — ABNORMAL HIGH (ref 80.0–100.0)
Monocytes Absolute: 1.2 10*3/uL — ABNORMAL HIGH (ref 0.1–1.0)
Monocytes Relative: 28 %
Neutro Abs: 1.8 10*3/uL (ref 1.7–7.7)
Neutrophils Relative %: 44 %
Platelets: 187 10*3/uL (ref 150–400)
RBC: 3.17 MIL/uL — ABNORMAL LOW (ref 3.87–5.11)
RDW: 14.7 % (ref 11.5–15.5)
WBC: 4.1 10*3/uL (ref 4.0–10.5)
nRBC: 0 % (ref 0.0–0.2)

## 2018-07-13 LAB — T4, FREE: Free T4: 0.66 ng/dL — ABNORMAL LOW (ref 0.82–1.77)

## 2018-07-13 LAB — TSH: TSH: 10.43 u[IU]/mL — ABNORMAL HIGH (ref 0.350–4.500)

## 2018-07-13 LAB — LACTATE DEHYDROGENASE: LDH: 204 U/L — ABNORMAL HIGH (ref 98–192)

## 2018-07-13 LAB — FOLATE: Folate: 33 ng/mL (ref >5.9)

## 2018-07-13 LAB — URIC ACID: Uric Acid, Serum: 5.6 mg/dL (ref 2.5–7.1)

## 2018-07-13 MED ORDER — HEPARIN SOD (PORK) LOCK FLUSH 100 UNIT/ML IV SOLN
INTRAVENOUS | Status: AC
  Filled 2018-07-13: qty 5

## 2018-07-13 MED ORDER — HEPARIN SOD (PORK) LOCK FLUSH 100 UNIT/ML IV SOLN
500.0000 [IU] | Freq: Once | INTRAVENOUS | Status: AC
  Administered 2018-07-13: 500 [IU] via INTRAVENOUS

## 2018-07-13 NOTE — Progress Notes (Signed)
[facility] day:  07/13/2018  Chief Complaint: Judy Torres is a 72 y.o. 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  HPI: The patient was last seen Judy the medical oncology clinic on 07/06/2018.  At that time, she denied any complaints.  She had a persistent facial and neck rash.  ANC was 0.  Chemotherapy was held.  Neutropenic precautions were instituted.  CBC on 07/11/2018 revealed a hematocrit of 34.0, hemoglobin 10.4, MCV 101.2, platelets 198,0000, WBC 1900 with an ANC of 100.  During the interim, she has felt "ok".  She continues to have the rash on her face and neck.  She notes some fluid Judy her ankles if she is standing on her feet.  She denies any PND, orthopnea or shortness of breath.  She saw Dr. Jefm Bryant on 07/11/2018.  She states that he does not believe her low counts are due to lupus.  She denies any viral infections, URI symptoms, change Judy medications or herbal products.   Past Medical History:  Diagnosis Date  . Cancer [facility])    Stomach cancer  . Chronic kidney disease   . GERD (gastroesophageal reflux disease)   . HTN (hypertension)   . Hyperlipidemia   . Lupus ([place])   . S/P angioplasty with stent     Past Surgical History:  Procedure Laterality Date  . CARDIAC SURGERY    . CESAREAN SECTION    . COLONOSCOPY N/A 12/30/2017   Procedure: COLONOSCOPY;  Surgeon: Lin Landsman, MD;  Location: [facility] ENDOSCOPY;  Service: Gastroenterology;  Laterality: N/A;  . CORONARY ARTERY BYPASS GRAFT  2003  . ESOPHAGOGASTRODUODENOSCOPY N/A 12/30/2017   Procedure: ESOPHAGOGASTRODUODENOSCOPY (EGD);  Surgeon: Lin Landsman, MD;  Location: [facility] ENDOSCOPY;  Service: Gastroenterology;  Laterality: N/A;  . ESOPHAGOGASTRODUODENOSCOPY Left 02/05/2018   Procedure: ESOPHAGOGASTRODUODENOSCOPY (EGD);  Surgeon: Virgel Manifold, MD;  Location: [facility] ENDOSCOPY;  Service:  Endoscopy;  Laterality: Left;  . ESOPHAGOGASTRODUODENOSCOPY (EGD) WITH PROPOFOL N/A 03/15/2018   Procedure: ESOPHAGOGASTRODUODENOSCOPY (EGD) WITH PROPOFOL;  Surgeon: Virgel Manifold, MD;  Location: [facility] ENDOSCOPY;  Service: Endoscopy;  Laterality: N/A;  . FLEXIBLE SIGMOIDOSCOPY N/A 12/31/2017   Procedure: FLEXIBLE SIGMOIDOSCOPY;  Surgeon: Jonathon Bellows, MD;  Location: [facility] ENDOSCOPY;  Service: Gastroenterology;  Laterality: N/A;  . PORTA CATH INSERTION N/A 04/19/2018   Procedure: PORTA CATH INSERTION;  Surgeon: Algernon Huxley, MD;  Location: [facility] CV LAB;  Service: Cardiovascular;  Laterality: N/A;    Family History  Problem Relation Age of Onset  . Hypertension Mother   . Stroke Mother   . Vascular Disease Mother   . Hypertension Sister     Social History:  reports that she quit smoking about 16 years ago. Her smoking use included cigarettes. She has a 10.00 pack-year smoking history. She has never used smokeless tobacco. She reports previous alcohol use. She reports that she does not use drugs.  She has smoked 1/2 pack/day x 20 years.  She stopped smoking Judy 2000.  She denies any exposure to radiation or toxins.  She is from [place].  She moved to the [place] 52 years ago after marrying her husband.  She was Judy the [employer].  The patient is accompanied by her husband today.  Allergies:  Allergies  Allergen Reactions  . Penicillins Other (See Comments)    Pass out Has patient had a PCN reaction causing immediate rash, facial/tongue/throat swelling,  SOB or lightheadedness with hypotension: Yes Has patient had a PCN reaction causing severe rash involving mucus membranes or skin necrosis: No Has patient had a PCN reaction that required hospitalization: No Has patient had a PCN reaction occurring within the last 10 years: No If all of the above answers are "NO", then may proceed with Cephalosporin use.   . Sulfa Antibiotics Nausea Only  . Sulfasalazine Nausea And Vomiting       .  Ace Inhibitors Nausea Only  . Losartan Anxiety  . Spironolactone Other (See Comments)    Felt poorly but couldn't clarify     Current Medications: Current Outpatient Medications  Medication Sig Dispense Refill  . acetaminophen (TYLENOL) 500 MG tablet Take 1,000 mg by mouth 2 (two) times daily as needed for moderate pain or headache.    . alendronate (FOSAMAX) 70 MG tablet Take 70 mg by mouth every Saturday.     Marland Kitchen allopurinol (ZYLOPRIM) 100 MG tablet TAKE 1/2 TABLET BY MOUTH DAILY 15 tablet 3  . amLODipine (NORVASC) 5 MG tablet Take 5 mg by mouth daily.     . clotrimazole-betamethasone (LOTRISONE) cream Apply 1 application topically 2 (two) times daily as needed (rash).     . feeding supplement, ENSURE ENLIVE, (ENSURE ENLIVE) LIQD Take 237 mLs by mouth 2 (two) times daily between meals. (Patient taking differently: Take 237 mLs by mouth daily. ) 90 Bottle 0  . ferrous sulfate 325 (65 FE) MG tablet Take 1 tablet (325 mg total) by mouth 2 (two) times daily with a meal. 60 tablet 3  . fluticasone (FLONASE) 50 MCG/ACT nasal spray Place 1 spray into both nostrils daily as needed for allergies or rhinitis.    . hydroxychloroquine (PLAQUENIL) 200 MG tablet Take 200 mg by mouth 2 (two) times daily.     Marland Kitchen lidocaine-prilocaine (EMLA) cream Apply to affected area once 30 g 3  . Multiple Vitamin (MULTIVITAMIN WITH MINERALS) TABS tablet Take 1 tablet by mouth daily. 180 tablet 0  . Nutritional Supplements (ENSURE ACTIVE PO) Take by mouth.    . ondansetron (ZOFRAN) 4 MG tablet Take 1 tablet (4 mg total) by mouth 2 (two) times daily as needed for refractory nausea / vomiting. Start on day 3 after cyclophosphamide chemotherapy. 20 tablet 1  . pantoprazole (PROTONIX) 40 MG tablet Take 1 tablet (40 mg total) by mouth 2 (two) times daily before a meal. 60 tablet 1  . polyethylene glycol (MIRALAX / GLYCOLAX) packet Take 17 g by mouth daily.    . predniSONE (DELTASONE) 5 MG tablet Take 5 mg by mouth.    .  Pseudoephedrine-APAP 30-325 MG TABS Take 1 tablet by mouth daily as needed (sinuses).     . ranitidine (ZANTAC) 150 MG tablet Take 150 mg by mouth daily as needed for heartburn.     . simvastatin (ZOCOR) 40 MG tablet Take 40 mg by mouth daily.     . traMADol (ULTRAM) 50 MG tablet Take 50 mg by mouth 2 (two) times daily as needed for moderate pain.     . predniSONE (DELTASONE) 20 MG tablet Take 3 tablets (60 mg total) by mouth daily. Take on days 1-5 of chemotherapy. (Patient not taking: Reported on 07/13/2018) 15 tablet 5   No current facility-administered medications for this visit.     Review of Systems  Constitutional: Negative.  Negative for chills, diaphoresis, fever, malaise/fatigue and weight loss (up 4 pounds).       Feels "ok".  HENT: Negative.  Negative  for congestion, ear discharge, ear pain, nosebleeds, sinus pain, sore throat and tinnitus.   Eyes: Negative.  Negative for blurred vision, double vision, photophobia, pain, discharge and redness.  Respiratory: Negative.  Negative for cough, hemoptysis, sputum production and shortness of breath.   Cardiovascular: Positive for leg swelling (ankle). Negative for chest pain, palpitations, orthopnea and PND.  Gastrointestinal: Negative.  Negative for abdominal pain, blood Judy stool, constipation, diarrhea, heartburn, melena, nausea and vomiting.  Genitourinary: Negative.  Negative for dysuria, frequency, hematuria and urgency.       Good urine output.   Musculoskeletal: Negative.  Negative for back pain, falls, joint pain, myalgias and neck pain.  Skin: Negative.  Negative for itching.       No change Judy facial and chest rash.  Neurological: Negative.  Negative for dizziness, tingling, tremors, sensory change, speech change, focal weakness, weakness and headaches.  Endo/Heme/Allergies: Negative.  Does not bruise/bleed easily.  Psychiatric/Behavioral: Negative for depression and memory loss. The patient does not have insomnia.   All other  systems reviewed and are negative.  Performance status (ECOG): 1  Vital Signs: BP (!) 142/83 (BP Location: Left Arm)   Pulse 85   Temp 98.6 F (37 C) (Tympanic)   Resp 18   Wt 118 lb 14.4 oz (53.9 kg)   BMI 24.01 kg/m   Physical Exam  Constitutional: She is oriented to person, place, and time and well-developed, well-nourished, and Judy no distress. No distress.  HENT:  Head: Normocephalic. 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  Mouth/Throat: Oropharynx is clear and moist and mucous membranes are normal. She has dentures. No oropharyngeal exudate.  Eyes: Pupils are equal, round, and reactive to light. Conjunctivae and EOM are normal. Right eye exhibits no discharge. Left eye exhibits no discharge. No scleral icterus.  Glasses.  Brown eyes.  Neck: Normal range of motion. Neck supple. No JVD present.  Cardiovascular: Normal rate, regular rhythm, normal heart sounds and intact distal pulses. Exam reveals no gallop and no friction rub.  No murmur heard. Pulmonary/Chest: Effort normal and breath sounds normal. No respiratory distress. She has no wheezes. She has no rales.  Abdominal: Soft. She exhibits no distension and no mass. There is no abdominal tenderness. There is no rebound and no guarding.  Musculoskeletal: Normal range of motion.        General: Edema (2+ left ankle and 1+ right ankle edema) present. No tenderness.  Lymphadenopathy:    She has no cervical adenopathy.    She has no axillary adenopathy.       Right: No supraclavicular adenopathy present.       Left: No supraclavicular adenopathy present.  Neurological: She is alert and oriented to person, place, and time. Gait normal.  No foot drop.  Skin: Skin is dry. Rash (mild facial and upper neck erythema) noted. She is not diaphoretic. No erythema.  Facial and neck erythema.  Psychiatric: Mood, affect and judgment normal.  Nursing note and vitals reviewed.   Infusion on 07/13/2018  Component Date Value Ref  Range Status  . LDH 07/13/2018 204* 98 - 192 U/L Final   Performed at [facility], [address] [place], [place] [address]  . Sodium 07/13/2018 141  135 - 145 mmol/L Final  . Potassium 07/13/2018 3.5  3.5 - 5.1 mmol/L Final  . Chloride 07/13/2018 108  98 - 111 mmol/L Final  . CO2 07/13/2018 25  22 - 32 mmol/L Final  . Glucose, Bld 07/13/2018 117* 70 - 99  mg/dL Final  . BUN 07/13/2018 23  8 - 23 mg/dL Final  . Creatinine, Ser 07/13/2018 1.15* 0.44 - 1.00 mg/dL Final  . Calcium 07/13/2018 8.5* 8.9 - 10.3 mg/dL Final  . Total Protein 07/13/2018 5.7* 6.5 - 8.1 g/dL Final  . Albumin 07/13/2018 3.2* 3.5 - 5.0 g/dL Final  . AST 07/13/2018 27  15 - 41 U/L Final  . ALT 07/13/2018 13  0 - 44 U/L Final  . Alkaline Phosphatase 07/13/2018 62  38 - 126 U/L Final  . Total Bilirubin 07/13/2018 0.3  0.3 - 1.2 mg/dL Final  . GFR calc non Af Amer 07/13/2018 48* >60 mL/min Final  . GFR calc Af Amer 07/13/2018 56* >60 mL/min Final  . Anion gap 07/13/2018 8  5 - 15 Final   Performed at [facility], [address] [place], [place] [address]  . WBC 07/13/2018 4.1  4.0 - 10.5 K/uL Final  . RBC 07/13/2018 3.17* 3.87 - 5.11 MIL/uL Final  . Hemoglobin 07/13/2018 9.8* 12.0 - 15.0 g/dL Final  . HCT 07/13/2018 31.9* 36.0 - 46.0 % Final  . MCV 07/13/2018 100.6* 80.0 - 100.0 fL Final  . MCH 07/13/2018 30.9  26.0 - 34.0 pg Final  . MCHC 07/13/2018 30.7  30.0 - 36.0 g/dL Final  . RDW 07/13/2018 14.7  11.5 - 15.5 % Final  . Platelets 07/13/2018 187  150 - 400 K/uL Final  . nRBC 07/13/2018 0.0  0.0 - 0.2 % Final  . Neutrophils Relative % 07/13/2018 44  % Final  . Neutro Abs 07/13/2018 1.8  1.7 - 7.7 K/uL Final  . Lymphocytes Relative 07/13/2018 21  % Final  . Lymphs Abs 07/13/2018 0.9  0.7 - 4.0 K/uL Final  . Monocytes Relative 07/13/2018 28  % Final  . Monocytes Absolute 07/13/2018 1.2* 0.1 - 1.0 K/uL Final  . Eosinophils Relative 07/13/2018 4  % Final  . Eosinophils Absolute 07/13/2018 0.2   0.0 - 0.5 K/uL Final  . Basophils Relative 07/13/2018 1  % Final  . Basophils Absolute 07/13/2018 0.0  0.0 - 0.1 K/uL Final  . Immature Granulocytes 07/13/2018 2  % Final  . Abs Immature Granulocytes 07/13/2018 0.06  0.00 - 0.07 K/uL Final   Performed at [facility], [address] [place],  [address]  Appointment on 07/11/2018  Component Date Value Ref Range Status  . WBC 07/11/2018 1.9* 4.0 - 10.5 K/uL Final  . RBC 07/11/2018 3.36* 3.87 - 5.11 MIL/uL Final  . Hemoglobin 07/11/2018 10.4* 12.0 - 15.0 g/dL Final  . HCT 07/11/2018 34.0* 36.0 - 46.0 % Final  . MCV 07/11/2018 101.2* 80.0 - 100.0 fL Final  . MCH 07/11/2018 31.0  26.0 - 34.0 pg Final  . MCHC 07/11/2018 30.6  30.0 - 36.0 g/dL Final  . RDW 07/11/2018 14.9  11.5 - 15.5 % Final  . Platelets 07/11/2018 198  150 - 400 K/uL Final  . nRBC 07/11/2018 0.0  0.0 - 0.2 % Final  . Neutrophils Relative % 07/11/2018 6  % Final  . Neutro Abs 07/11/2018 0.1* 1.7 - 7.7 K/uL Final   Comment: REPEATED TO VERIFY THIS CRITICAL RESULT HAS VERIFIED AND BEEN CALLED TO BRYAN GRAY BY LONNIE RHONE ON 12 24 2019 AT 1115, AND HAS BEEN READ BACK. ('@11'$ :13AM)   . Lymphocytes Relative 07/11/2018 43  % Final  . Lymphs Abs 07/11/2018 0.8  0.7 - 4.0 K/uL Final  . Monocytes Relative 07/11/2018 44  % Final  . Monocytes  Absolute 07/11/2018 0.8  0.1 - 1.0 K/uL Final  . Eosinophils Relative 07/11/2018 4  % Final  . Eosinophils Absolute 07/11/2018 0.1  0.0 - 0.5 K/uL Final  . Basophils Relative 07/11/2018 1  % Final  . Basophils Absolute 07/11/2018 0.0  0.0 - 0.1 K/uL Final  . Immature Granulocytes 07/11/2018 2  % Final  . Abs Immature Granulocytes 07/11/2018 0.03  0.00 - 0.07 K/uL Final   Performed at [facility], [address] [place], [place] [address]    Assessment:  Judy Tallula Grindle is a 72 y.o. 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  IPI is 3 (intermediate-high risk),  age-adjusted IPI is 2 (low-intermediate risk), revised IPI is 3 (poor), and NCCN-IPI is 5 (intermediate-high risk).  CNS risk is 3 (intermediate: age, LDH, stage).  EGD on 03/15/2018 revealed pathology from the duodenal scalloped lesion and duodenal sweep ulcer with involvement with diffuse large B cell lymphoma.  Random gastric biopsy was negative for lymphoma.  There was mild chronic gastritis with reactive foveolar hyperplasia.  Gastric fundus scar revealed involvement with diffuse large B cell lymphoma.  GE junction biopsy revealed no lymphoma with squamocolumnar mucosa with mild chronic active inflammation.  The lymphoma has a non-germinal center immunophenotype with a Ki-67 proliferation index of 60% and shows Bcl-2/CMYC co-expression by  immunohistochemical analysis.   Summary of IHC results, neoplastic cells: Positive: PAX-5, CD20(variable), Bcl-2, MUM1, Bcl-6 (variable), CMYC.  Negative: CD10, CD5, CD30, CD138, Cyclin-D1, ALK-1, CD3.  EBV (ISH): Negative.  FISH for MYC gene rearrangement (8q24 gene region) was negative.  PET scan on 04/06/2018 revealed enlarged and hypermetabolic supraclavicular, mediastinal, abdominal and pelvic adenopathy. Additional smaller hypermetabolic lymph nodes identified within the left cervical lymph node chain. Largest lymph node was a 5.7 cm subcarinal lymph node (SUV 26.78).  Bone marrow aspirate and biopsy on 04/12/2018 revealed a slightly hypercellular bone marrow for age with trilineage hematopoiesis.  There were dyspoietic changes associated with the granulocyte series.  Flow cytometry revealed a predominance of T lymphocytes with relative abundance of CD8 + cells.  There was no significant B cell population.  Cytogenetics are pending.  Echo on 04/04/2018 revealed an EF of 55-60%.  She presented with a normocytic anemia.  She was admitted x 2 (12/2017 and 01/2018) with rectal bleeding.  She is intolerant of oral iron.  Diet is modest.  EGD on 12/30/2017  revealed non bleeding gastric erosions Judy the antrum.  Colonoscopy on 12/30/2017 revealed numerous polyps (one 5 mm Judy ascending colon, two 7 mm polyps Judy transverse colon, five 5-7 mm polyps Judy the descending colon, one 7 mm polyp Judy the sigmoid colon, one 8 mm polyp Judy the recto-sigmoid colon, and one 19 mm polyp Judy the rectum). 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    She developed hematochezia post colonoscopy.  Flexible sigmoidoscopy on 12/31/2017 revealed a rectal ulcer at the site of the polypectomy and blood Judy the rectum.  Clips were placed.  EGD on 02/05/2018 revealed 2 bleeding AVMs (treated with APC) Judy the small bowel with scalloped/denuded mucosa underneath the bleeding sites.  Work-up on 04/03/2018 revealed a hematocrit of 32.5, hemoglobin 10.7, MCV 91.7, platelets 207,000, WBC 1800 with an ANC of 600.  Uric acid was 8.8.  LDH was 241 (98-192) and beta2-microglobulin  5.8 (0.6-2.4).  Negative studies included:  hepatitis B core antibody total, hepatitis B surface antigen, hepatitis C antibody, HIV testing.  She has required 4 units of PRBCs (3 units Judy 12/2017 and 1 unit Judy 01/2018).  Ferritin has been followed: 30 on 12/29/2017, 93 on 02/13/2018, 57 on 03/07/2018, and 38 on 04/03/2018.  Labs on 12/29/2017 revealed the following:  B12 was 254 (low normal).  She has been on oral B12 since 03/07/2018.  B12 was 1524 on 04/03/2018.  Folate 6.3 (> 5.9) on 12/29/2017 and 55.8 on 04/03/2018.    She is s/p 3 cycles of mini-RCHOP (04/24/2018 - 06/07/2018) with Udencya support.  Cycle #1 platelet nadir was 82,000.  Chest, abdomen, and pelvic CT on 07/03/2018 revealed marked interval response to therapy. Previously identified relatively bulky lymphadenopathy Judy the chest, abdomen, and pelvis had resolved with no pathologically enlarged lymphadenopathy.  There was no new or progressive interval findings.  She has chronic renal insufficiency.  Creatinine  was 1.42 (CrCl 25 ml/min) on 04/03/2018.  She has lupus (diagnosed 1982). She is on prednisone 5 mg a day and Plaquenil.   Symptomatically, she denies an B symptoms.  Exam reveals bilateral ankle edema and stable facial and neck erythema.  ANC has improved from 0 to 1800.  Plan: 1. Labs today:  CBC with diff, CMP, LDH, uric acid, B12, folate, TSH, free T4. 2. Peripheral smear for path review. 3. DLBCL Patient is s/p 3 cycles of mini-RCHOP Restaging CT scans reveal no pathologically enlarged lymph nodes. Discuss plan for 6 cycles of mini-RHOP. Discuss plan for echo given new lower extremity edema. Counts have recovered.  Discuss recheck next week (after holiday) to ensure stability prior to cycle #4. 4. Renal insufficiency  BUN 23 and creatinine 1.15 (CrCl 34.1 mL/min) today. 5. Leukopenia  Patient remains asymptomatic.  ANC was 0 and is now 1800.  Dr Jefm Bryant does not feel etiology secondary to lupus.  Possible transient viral suppression (? parvovirus B19).   Bone marrow deferred as counts have recovered. 6. TLS monitoring  LDH 204, uric acid 5.6, K+ 3.5  No evidence of TLS.  Risk low with recent CT scan results.  Continue prophylactic allopurinol 50 mg a day until treatment complete. 7. B12 deficiency  Patient has been on oral B12 since 03/07/2018.  B12 level was normal on 04/03/2018.  Continue oral B12 1000 mcg daily. 8. Schedule echo on 07/19/2018 (ASAP; next available prior to planned chemotherapy). 9. RTC on 07/20/2018 for NP assessment, labs (CBC with diff, CMP, Mg), review of echo, and cycle #4 mini-RCHOP + Udencya support.   Lequita Asal, MD  07/13/2018, 9:41 AM

## 2018-07-13 NOTE — Progress Notes (Signed)
Patient here for follow up. Pt states she has a lot of "fluid on her legs, feet and ankles."

## 2018-07-14 ENCOUNTER — Inpatient Hospital Stay: Payer: Medicare Other

## 2018-07-19 ENCOUNTER — Ambulatory Visit
Admission: RE | Admit: 2018-07-19 | Discharge: 2018-07-19 | Disposition: A | Discharge status: Home / Self Care | Payer: Medicare Other | Source: Ambulatory Visit | Attending: Hematology and Oncology | Admitting: Hematology and Oncology

## 2018-07-19 DIAGNOSIS — C8339 Diffuse large B-cell lymphoma, extranodal and solid organ sites: Secondary | ICD-10-CM | POA: Diagnosis present

## 2018-07-19 DIAGNOSIS — I119 Hypertensive heart disease without heart failure: Secondary | ICD-10-CM | POA: Insufficient documentation

## 2018-07-19 DIAGNOSIS — E785 Hyperlipidemia, unspecified: Secondary | ICD-10-CM | POA: Insufficient documentation

## 2018-07-19 DIAGNOSIS — Z9221 Personal history of antineoplastic chemotherapy: Secondary | ICD-10-CM

## 2018-07-19 DIAGNOSIS — I071 Rheumatic tricuspid insufficiency: Secondary | ICD-10-CM | POA: Diagnosis not present

## 2018-07-19 HISTORY — PX: CT SIM FOR RADIATION THERAPY (ARMC HX): HXRAD1841

## 2018-07-20 ENCOUNTER — Inpatient Hospital Stay (HOSPITAL_BASED_OUTPATIENT_CLINIC_OR_DEPARTMENT_OTHER): Payer: Medicare Other | Admitting: Urgent Care

## 2018-07-20 ENCOUNTER — Other Ambulatory Visit: Payer: Self-pay

## 2018-07-20 ENCOUNTER — Other Ambulatory Visit: Payer: Medicare Other

## 2018-07-20 ENCOUNTER — Inpatient Hospital Stay: Payer: Medicare Other | Attending: Urgent Care

## 2018-07-20 ENCOUNTER — Ambulatory Visit: Payer: Medicare Other | Admitting: Urgent Care

## 2018-07-20 VITALS — BP 160/92 | HR 80 | Temp 98.0°F | Resp 18 | Wt 115.4 lb

## 2018-07-20 DIAGNOSIS — R634 Abnormal weight loss: Secondary | ICD-10-CM | POA: Insufficient documentation

## 2018-07-20 DIAGNOSIS — Z5112 Encounter for antineoplastic immunotherapy: Secondary | ICD-10-CM | POA: Insufficient documentation

## 2018-07-20 DIAGNOSIS — E538 Deficiency of other specified B group vitamins: Secondary | ICD-10-CM | POA: Diagnosis not present

## 2018-07-20 DIAGNOSIS — Z5111 Encounter for antineoplastic chemotherapy: Secondary | ICD-10-CM

## 2018-07-20 DIAGNOSIS — Z7952 Long term (current) use of systemic steroids: Secondary | ICD-10-CM | POA: Diagnosis not present

## 2018-07-20 DIAGNOSIS — Z951 Presence of aortocoronary bypass graft: Secondary | ICD-10-CM | POA: Diagnosis not present

## 2018-07-20 DIAGNOSIS — C8339 Diffuse large B-cell lymphoma, extranodal and solid organ sites: Secondary | ICD-10-CM | POA: Diagnosis present

## 2018-07-20 DIAGNOSIS — N189 Chronic kidney disease, unspecified: Secondary | ICD-10-CM | POA: Insufficient documentation

## 2018-07-20 DIAGNOSIS — K219 Gastro-esophageal reflux disease without esophagitis: Secondary | ICD-10-CM | POA: Insufficient documentation

## 2018-07-20 DIAGNOSIS — D701 Agranulocytosis secondary to cancer chemotherapy: Secondary | ICD-10-CM | POA: Diagnosis not present

## 2018-07-20 DIAGNOSIS — Z87891 Personal history of nicotine dependence: Secondary | ICD-10-CM

## 2018-07-20 DIAGNOSIS — N289 Disorder of kidney and ureter, unspecified: Secondary | ICD-10-CM

## 2018-07-20 DIAGNOSIS — R600 Localized edema: Secondary | ICD-10-CM | POA: Insufficient documentation

## 2018-07-20 DIAGNOSIS — M329 Systemic lupus erythematosus, unspecified: Secondary | ICD-10-CM | POA: Insufficient documentation

## 2018-07-20 DIAGNOSIS — Z9189 Other specified personal risk factors, not elsewhere classified: Secondary | ICD-10-CM

## 2018-07-20 DIAGNOSIS — T451X5S Adverse effect of antineoplastic and immunosuppressive drugs, sequela: Secondary | ICD-10-CM | POA: Diagnosis not present

## 2018-07-20 DIAGNOSIS — I129 Hypertensive chronic kidney disease with stage 1 through stage 4 chronic kidney disease, or unspecified chronic kidney disease: Secondary | ICD-10-CM | POA: Diagnosis not present

## 2018-07-20 DIAGNOSIS — E785 Hyperlipidemia, unspecified: Secondary | ICD-10-CM | POA: Insufficient documentation

## 2018-07-20 DIAGNOSIS — Z79899 Other long term (current) drug therapy: Secondary | ICD-10-CM | POA: Insufficient documentation

## 2018-07-20 DIAGNOSIS — T451X5A Adverse effect of antineoplastic and immunosuppressive drugs, initial encounter: Secondary | ICD-10-CM

## 2018-07-20 LAB — COMPREHENSIVE METABOLIC PANEL
ALT: 18 U/L (ref 0–44)
AST: 34 U/L (ref 15–41)
Albumin: 3.8 g/dL (ref 3.5–5.0)
Alkaline Phosphatase: 73 U/L (ref 38–126)
Anion gap: 10 (ref 5–15)
BUN: 27 mg/dL — ABNORMAL HIGH (ref 8–23)
CO2: 27 mmol/L (ref 22–32)
Calcium: 8.8 mg/dL — ABNORMAL LOW (ref 8.9–10.3)
Chloride: 104 mmol/L (ref 98–111)
Creatinine, Ser: 1.34 mg/dL — ABNORMAL HIGH (ref 0.44–1.00)
GFR calc Af Amer: 46 mL/min — ABNORMAL LOW (ref >60)
GFR calc non Af Amer: 40 mL/min — ABNORMAL LOW (ref >60)
Glucose, Bld: 111 mg/dL — ABNORMAL HIGH (ref 70–99)
Potassium: 4 mmol/L (ref 3.5–5.1)
Sodium: 141 mmol/L (ref 135–145)
Total Bilirubin: 0.5 mg/dL (ref 0.3–1.2)
Total Protein: 6.6 g/dL (ref 6.5–8.1)

## 2018-07-20 LAB — CBC WITH DIFFERENTIAL/PLATELET
Abs Immature Granulocytes: 0.05 10*3/uL (ref 0.00–0.07)
Basophils Absolute: 0 10*3/uL (ref 0.0–0.1)
Basophils Relative: 0 %
Eosinophils Absolute: 0.3 10*3/uL (ref 0.0–0.5)
Eosinophils Relative: 3 %
HCT: 36.1 % (ref 36.0–46.0)
Hemoglobin: 11.3 g/dL — ABNORMAL LOW (ref 12.0–15.0)
Immature Granulocytes: 1 %
Lymphocytes Relative: 12 %
Lymphs Abs: 1.1 10*3/uL (ref 0.7–4.0)
MCH: 31.5 pg (ref 26.0–34.0)
MCHC: 31.3 g/dL (ref 30.0–36.0)
MCV: 100.6 fL — ABNORMAL HIGH (ref 80.0–100.0)
Monocytes Absolute: 0.6 10*3/uL (ref 0.1–1.0)
Monocytes Relative: 6 %
Neutro Abs: 7.3 10*3/uL (ref 1.7–7.7)
Neutrophils Relative %: 78 %
Platelets: 162 10*3/uL (ref 150–400)
RBC: 3.59 MIL/uL — ABNORMAL LOW (ref 3.87–5.11)
RDW: 14.5 % (ref 11.5–15.5)
WBC: 9.3 10*3/uL (ref 4.0–10.5)
nRBC: 0 % (ref 0.0–0.2)

## 2018-07-20 LAB — MAGNESIUM: Magnesium: 2.2 mg/dL (ref 1.7–2.4)

## 2018-07-20 NOTE — Progress Notes (Signed)
Patient states she had a lot of nausea yesterday.  Took her antiemetic and it improved.  Nothing today.  Offers no other complaints.

## 2018-07-20 NOTE — Progress Notes (Signed)
[facility] day:  07/20/2018  Chief Complaint: [name] is a 73 y.o. 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  HPI: The patient was last seen in the medical oncology clinic on 07/13/2018.  At that time, patient was feeling "okay".  She complained of a rash on her face and neck.  She was having some minor swelling in her ankles with prolonged standing.  No shortness of breath.  She had been seen in follow-up by rheumatology Jefm Bryant, MD), who advised that her neutropenia was not secondary to her lupus diagnosis.  No recent infections.  Cycle #4 mini-RCHOP was held due to neutropenia (ANC 0).  Exam revealed bilateral ankle edema, as well as facial and neck erythema.  ANC 1800.  Routine echocardiogram performed on 07/19/2018 that revealed an LVEF of 50 to 55%.  RWMAs could not be excluded in the setting of a bundle branch block.  In the interim, patient has felt well.  She denies any acute problems.  Patient continues to experience nausea and intermittent episodes of vomiting.  She denies any diarrhea.  She continues to experience persistent lower extremity edema. Patient denies that she has experienced any B symptoms. She denies any interval infections. Rash/redness to face and neck has resolved.  Patient advises that she maintains an adequate appetite. She is eating well. Weight today is 115 lb 6 oz (52.3 kg), which compared to her last visit to the clinic, represents a 3 pound decrease.  Patient denies pain in the clinic today.  Past Medical History:  Diagnosis Date  . Cancer [facility])    Stomach cancer  . Chronic kidney disease   . GERD (gastroesophageal reflux disease)   . HTN (hypertension)   . Hyperlipidemia   . Lupus ([place])   . S/P angioplasty with stent     Past Surgical History:  Procedure Laterality Date  . CARDIAC SURGERY    . CESAREAN SECTION    . COLONOSCOPY N/A  12/30/2017   Procedure: COLONOSCOPY;  Surgeon: Lin Landsman, MD;  Location: [facility] ENDOSCOPY;  Service: Gastroenterology;  Laterality: N/A;  . CORONARY ARTERY BYPASS GRAFT  2003  . ESOPHAGOGASTRODUODENOSCOPY N/A 12/30/2017   Procedure: ESOPHAGOGASTRODUODENOSCOPY (EGD);  Surgeon: Lin Landsman, MD;  Location: [facility] ENDOSCOPY;  Service: Gastroenterology;  Laterality: N/A;  . ESOPHAGOGASTRODUODENOSCOPY Left 02/05/2018   Procedure: ESOPHAGOGASTRODUODENOSCOPY (EGD);  Surgeon: Virgel Manifold, MD;  Location: [facility] ENDOSCOPY;  Service: Endoscopy;  Laterality: Left;  . ESOPHAGOGASTRODUODENOSCOPY (EGD) WITH PROPOFOL N/A 03/15/2018   Procedure: ESOPHAGOGASTRODUODENOSCOPY (EGD) WITH PROPOFOL;  Surgeon: Virgel Manifold, MD;  Location: [facility] ENDOSCOPY;  Service: Endoscopy;  Laterality: N/A;  . FLEXIBLE SIGMOIDOSCOPY N/A 12/31/2017   Procedure: FLEXIBLE SIGMOIDOSCOPY;  Surgeon: Jonathon Bellows, MD;  Location: [facility] ENDOSCOPY;  Service: Gastroenterology;  Laterality: N/A;  . PORTA CATH INSERTION N/A 04/19/2018   Procedure: PORTA CATH INSERTION;  Surgeon: Algernon Huxley, MD;  Location: [facility] CV LAB;  Service: Cardiovascular;  Laterality: N/A;    Family History  Problem Relation Age of Onset  . Hypertension Mother   . Stroke Mother   . Vascular Disease Mother   . Hypertension Sister     Social History:  reports that she quit smoking about 16 years ago. Her smoking use included cigarettes. She has a 10.00 pack-year smoking history. She has never used smokeless tobacco. She reports previous alcohol use. She reports that she does not  use drugs.  She has smoked 1/2 pack/day x 20 years.  She stopped smoking in 2000.  She denies any exposure to radiation or toxins.  She is from [place].  She moved to the [place] 52 years ago after marrying her husband.  She was in the [employer].  The patient is accompanied by her husband today.  Allergies:  Allergies  Allergen Reactions  . Penicillins Other (See  Comments)    Pass out Has patient had a PCN reaction causing immediate rash, facial/tongue/throat swelling, SOB or lightheadedness with hypotension: Yes Has patient had a PCN reaction causing severe rash involving mucus membranes or skin necrosis: No Has patient had a PCN reaction that required hospitalization: No Has patient had a PCN reaction occurring within the last 10 years: No If all of the above answers are "NO", then may proceed with Cephalosporin use.   . Sulfa Antibiotics Nausea Only  . Sulfasalazine Nausea And Vomiting       . Ace Inhibitors Nausea Only  . Losartan Anxiety  . Spironolactone Other (See Comments)    Felt poorly but couldn't clarify     Current Medications: Current Outpatient Medications  Medication Sig Dispense Refill  . acetaminophen (TYLENOL) 500 MG tablet Take 1,000 mg by mouth 2 (two) times daily as needed for moderate pain or headache.    . alendronate (FOSAMAX) 70 MG tablet Take 70 mg by mouth every Saturday.     Marland Kitchen allopurinol (ZYLOPRIM) 100 MG tablet TAKE 1/2 TABLET BY MOUTH DAILY 15 tablet 3  . amLODipine (NORVASC) 5 MG tablet Take 5 mg by mouth daily.     . clotrimazole-betamethasone (LOTRISONE) cream Apply 1 application topically 2 (two) times daily as needed (rash).     . feeding supplement, ENSURE ENLIVE, (ENSURE ENLIVE) LIQD Take 237 mLs by mouth 2 (two) times daily between meals. (Patient taking differently: Take 237 mLs by mouth daily. ) 90 Bottle 0  . ferrous sulfate 325 (65 FE) MG tablet Take 1 tablet (325 mg total) by mouth 2 (two) times daily with a meal. 60 tablet 3  . fluticasone (FLONASE) 50 MCG/ACT nasal spray Place 1 spray into both nostrils daily as needed for allergies or rhinitis.    . hydroxychloroquine (PLAQUENIL) 200 MG tablet Take 200 mg by mouth 2 (two) times daily.     Marland Kitchen lidocaine-prilocaine (EMLA) cream Apply to affected area once 30 g 3  . Multiple Vitamin (MULTIVITAMIN WITH MINERALS) TABS tablet Take 1 tablet by mouth  daily. 180 tablet 0  . Nutritional Supplements (ENSURE ACTIVE PO) Take by mouth.    . ondansetron (ZOFRAN) 4 MG tablet Take 1 tablet (4 mg total) by mouth 2 (two) times daily as needed for refractory nausea / vomiting. Start on day 3 after cyclophosphamide chemotherapy. 20 tablet 1  . pantoprazole (PROTONIX) 40 MG tablet Take 1 tablet (40 mg total) by mouth 2 (two) times daily before a meal. 60 tablet 1  . polyethylene glycol (MIRALAX / GLYCOLAX) packet Take 17 g by mouth daily.    . predniSONE (DELTASONE) 20 MG tablet Take 3 tablets (60 mg total) by mouth daily. Take on days 1-5 of chemotherapy. (Patient not taking: Reported on 07/13/2018) 15 tablet 5  . predniSONE (DELTASONE) 5 MG tablet Take 5 mg by mouth.    . Pseudoephedrine-APAP 30-325 MG TABS Take 1 tablet by mouth daily as needed (sinuses).     . ranitidine (ZANTAC) 150 MG tablet Take 150 mg by mouth daily as needed for  heartburn.     . simvastatin (ZOCOR) 40 MG tablet Take 40 mg by mouth daily.     . traMADol (ULTRAM) 50 MG tablet Take 50 mg by mouth 2 (two) times daily as needed for moderate pain.      No current facility-administered medications for this visit.     Review of Systems  Constitutional: Positive for weight loss (down 3 pounds). Negative for diaphoresis, fever and malaise/fatigue.  HENT: Negative.   Eyes: Negative.   Respiratory: Negative for cough, hemoptysis, sputum production and shortness of breath.   Cardiovascular: Negative for chest pain, palpitations, orthopnea, leg swelling (chronic to BILATERAL ankles) and PND.  Gastrointestinal: Negative for abdominal pain, blood in stool, constipation, diarrhea, melena, nausea and vomiting.  Genitourinary: Negative for dysuria, frequency, hematuria and urgency.       Voiding normally; urine clear.  Musculoskeletal: Negative for back pain, falls, joint pain and myalgias.  Skin: Negative for itching and rash.  Neurological: Negative for dizziness, tremors, weakness and  headaches.  Endo/Heme/Allergies: Does not bruise/bleed easily.  Psychiatric/Behavioral: Negative for depression, memory loss and suicidal ideas. The patient is not nervous/anxious and does not have insomnia.   All other systems reviewed and are negative.  Performance status (ECOG): 1  Vital Signs: BP (!) 160/92 (BP Location: Left Arm, Patient Position: Sitting)   Pulse 80   Temp 98 F (36.7 C) (Tympanic)   Resp 18   Wt 115 lb 6 oz (52.3 kg)   BMI 23.30 kg/m   Physical Exam  Constitutional: She is oriented to person, place, and time and well-developed, well-nourished, and in no distress.  HENT:  Head: Normocephalic and atraumatic. Hair is abnormal (chemotherapy induced alopecia).  Mouth/Throat: Oropharynx is clear and moist and mucous membranes are normal. She has dentures.  Eyes: Pupils are equal, round, and reactive to light. EOM are normal. No scleral icterus.  Neck: Normal range of motion. Neck supple. No tracheal deviation present. No thyromegaly present.  Cardiovascular: Normal rate, regular rhythm, normal heart sounds and intact distal pulses. Exam reveals no gallop and no friction rub.  No murmur heard. Pulmonary/Chest: Effort normal and breath sounds normal. No respiratory distress. She has no wheezes. She has no rales.  PAC to RIGHT chest wall; accessed; no s/s of infection  Abdominal: Soft. Bowel sounds are normal. She exhibits no distension. There is no abdominal tenderness.  Musculoskeletal: Normal range of motion.        General: Edema (1+ to BILATERAL ankles) present. No tenderness.  Lymphadenopathy:    She has no cervical adenopathy.    She has no axillary adenopathy.       Right: No inguinal and no supraclavicular adenopathy present.       Left: No inguinal and no supraclavicular adenopathy present.  Neurological: She is alert and oriented to person, place, and time.  Skin: Skin is warm and dry. No rash noted. No erythema.  Psychiatric: Mood, affect and judgment  normal.  Nursing note and vitals reviewed.   Orders Only on 07/20/2018  Component Date Value Ref Range Status  . Magnesium 07/20/2018 2.2  1.7 - 2.4 mg/dL Final   Performed at [facility], [address], [place], [place] [address]  . Sodium 07/20/2018 141  135 - 145 mmol/L Final  . Potassium 07/20/2018 4.0  3.5 - 5.1 mmol/L Final  . Chloride 07/20/2018 104  98 - 111 mmol/L Final  . CO2 07/20/2018 27  22 - 32 mmol/L Final  . Glucose, Bld 07/20/2018  111* 70 - 99 mg/dL Final  . BUN 07/20/2018 27* 8 - 23 mg/dL Final  . Creatinine, Ser 07/20/2018 1.34* 0.44 - 1.00 mg/dL Final  . Calcium 07/20/2018 8.8* 8.9 - 10.3 mg/dL Final  . Total Protein 07/20/2018 6.6  6.5 - 8.1 g/dL Final  . Albumin 07/20/2018 3.8  3.5 - 5.0 g/dL Final  . AST 07/20/2018 34  15 - 41 U/L Final  . ALT 07/20/2018 18  0 - 44 U/L Final  . Alkaline Phosphatase 07/20/2018 73  38 - 126 U/L Final  . Total Bilirubin 07/20/2018 0.5  0.3 - 1.2 mg/dL Final  . GFR calc non Af Amer 07/20/2018 40* >60 mL/min Final  . GFR calc Af Amer 07/20/2018 46* >60 mL/min Final  . Anion gap 07/20/2018 10  5 - 15 Final   Performed at [facility], [address] [place], [place] [address]  . WBC 07/20/2018 9.3  4.0 - 10.5 K/uL Final  . RBC 07/20/2018 3.59* 3.87 - 5.11 MIL/uL Final  . Hemoglobin 07/20/2018 11.3* 12.0 - 15.0 g/dL Final  . HCT 07/20/2018 36.1  36.0 - 46.0 % Final  . MCV 07/20/2018 100.6* 80.0 - 100.0 fL Final  . MCH 07/20/2018 31.5  26.0 - 34.0 pg Final  . MCHC 07/20/2018 31.3  30.0 - 36.0 g/dL Final  . RDW 07/20/2018 14.5  11.5 - 15.5 % Final  . Platelets 07/20/2018 162  150 - 400 K/uL Final  . nRBC 07/20/2018 0.0  0.0 - 0.2 % Final  . Neutrophils Relative % 07/20/2018 78  % Final  . Neutro Abs 07/20/2018 7.3  1.7 - 7.7 K/uL Final  . Lymphocytes Relative 07/20/2018 12  % Final  . Lymphs Abs 07/20/2018 1.1  0.7 - 4.0 K/uL Final  . Monocytes Relative 07/20/2018 6  % Final  . Monocytes Absolute 07/20/2018 0.6   0.1 - 1.0 K/uL Final  . Eosinophils Relative 07/20/2018 3  % Final  . Eosinophils Absolute 07/20/2018 0.3  0.0 - 0.5 K/uL Final  . Basophils Relative 07/20/2018 0  % Final  . Basophils Absolute 07/20/2018 0.0  0.0 - 0.1 K/uL Final  . Immature Granulocytes 07/20/2018 1  % Final  . Abs Immature Granulocytes 07/20/2018 0.05  0.00 - 0.07 K/uL Final   Performed at [facility], [address] [place], [place] [address]    Assessment:  In Patric Vanpelt is a 73 y.o. 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  IPI is 3 (intermediate-high risk), age-adjusted IPI is 2 (low-intermediate risk), revised IPI is 3 (poor), and NCCN-IPI is 5 (intermediate-high risk).  CNS risk is 3 (intermediate: age, LDH, stage).  EGD on 03/15/2018 revealed pathology from the duodenal scalloped lesion and duodenal sweep ulcer with involvement with diffuse large B cell lymphoma.  Random gastric biopsy was negative for lymphoma.  There was mild chronic gastritis with reactive foveolar hyperplasia.  Gastric fundus scar revealed involvement with diffuse large B cell lymphoma.  GE junction biopsy revealed no lymphoma with squamocolumnar mucosa with mild chronic active inflammation.  The lymphoma has a non-germinal center immunophenotype with a Ki-67 proliferation index of 60% and shows Bcl-2/CMYC co-expression by  immunohistochemical analysis.   Summary of IHC results, neoplastic cells: Positive: PAX-5, CD20(variable), Bcl-2, MUM1, Bcl-6 (variable), CMYC.  Negative: CD10, CD5, CD30, CD138, Cyclin-D1, ALK-1, CD3.  EBV (ISH): Negative.  FISH for MYC gene rearrangement (8q24 gene region) was negative.  PET scan on 04/06/2018 revealed  enlarged and hypermetabolic supraclavicular, mediastinal, abdominal and pelvic adenopathy. Additional smaller hypermetabolic lymph nodes identified within the left cervical lymph node chain. Largest lymph node was a 5.7 cm subcarinal lymph node (SUV  26.78).  Bone marrow aspirate and biopsy on 04/12/2018 revealed a slightly hypercellular bone marrow for age with trilineage hematopoiesis.  There were dyspoietic changes associated with the granulocyte series.  Flow cytometry revealed a predominance of T lymphocytes with relative abundance of CD8 + cells.  There was no significant B cell population.  Cytogenetics are pending.  Echo on 04/04/2018 revealed an EF of 55-60%.  She presented with a normocytic anemia.  She was admitted x 2 (12/2017 and 01/2018) with rectal bleeding.  She is intolerant of oral iron.  Diet is modest.  EGD on 12/30/2017 revealed non bleeding gastric erosions in the antrum.  Colonoscopy on 12/30/2017 revealed numerous polyps (one 5 mm in ascending colon, two 7 mm polyps in transverse colon, five 5-7 mm polyps in the descending colon, one 7 mm polyp in the sigmoid colon, one 8 mm polyp in the recto-sigmoid colon, and one 19 mm polyp in the rectum). 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    She developed hematochezia post colonoscopy.  Flexible sigmoidoscopy on 12/31/2017 revealed a rectal ulcer at the site of the polypectomy and blood in the rectum.  Clips were placed.  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  Work-up on 04/03/2018 revealed a hematocrit of 32.5, hemoglobin 10.7, MCV 91.7, platelets 207,000, WBC 1800 with an ANC of 600.  Uric acid was 8.8.  LDH was 241 (98-192) and beta2-microglobulin  5.8 (0.6-2.4).  Negative studies included:  hepatitis B core antibody total, hepatitis B surface antigen, hepatitis C antibody, HIV testing.     She has required 4 units of PRBCs (3 units in 12/2017 and 1 unit in 01/2018).  Ferritin has been followed: 30 on 12/29/2017, 93 on 02/13/2018, 57 on 03/07/2018, and 38 on 04/03/2018.  Labs on 12/29/2017 revealed the following:  B12 was 254 (low  normal).  She has been on oral B12 since 03/07/2018.  B12 was 1524 on 04/03/2018.  Folate 6.3 (> 5.9) on 12/29/2017 and 55.8 on 04/03/2018.    She is s/p 3 cycles of mini-RCHOP (04/24/2018 - 06/07/2018) with Udencya support.  Cycle #1 platelet nadir was 82,000. Cycle #4 was postponed on 07/13/2018 ([place] 0).   Chest, abdomen, and pelvic CT on 07/03/2018 revealed marked interval response to therapy. Previously identified relatively bulky lymphadenopathy in the chest, abdomen, and pelvis had resolved with no pathologically enlarged lymphadenopathy.  There was no new or progressive interval findings.  She has chronic renal insufficiency.  Creatinine was 1.42 (CrCl 25 ml/min) on 04/03/2018.  She has lupus (diagnosed 1982). She is on prednisone 5 mg a day and Plaquenil.   Symptomatically, patient is doing well overall. She denies any acute concerns. Some intermittent nausea. Denies diarrhea and fevers. No infections. Persistent BLE (ankle edema). Rash has resolved. Exam reveals the aforementioned edema.  WBC 9300 ([place] 7300).  Hemoglobin 11.3, hematocrit 36.1, MCV 100.6, and platelets 162,000.  BUN 27 and creatinine 1.34 (CrCl 28.9 mL/min).   Plan: 1. Labs today: CBC with differential, CMP, magnesium 2. DLBCL Doing well overall. Tolerating treatments with minimal side effects. Reviewed plans for 6 cycles of mini-RCHOP Labs reviewed. Blood counts stable and adequate enough for treatment. Will proceed with cycle #4 mini-RCHOP tomorrow  with OnPro Neulasta as planned.  Discuss symptom management.  Patient has antiemetics and pain medications at home to use on a PRN basis. Patient  advising that the  prescribed interventions are adequate at this point. Continue all medications as previously prescribed.  3. 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 BUN 27 and creatinine 1.34 (CrCl 28.9 mL/min).  Encourage patient to increase oral fluid intake. 4. Neutropenia  Counts stable.  WBC 9300 (ANC  7300, ALC 1100).  Has been seen in consult by rheumatology who feels that previously demonstrated declining count was not secondary to her lupus.  No need to consider bone marrow at this time as counts have recovered.  Continue routine lab monitoring. 5. BILATERAL lower extremity edema  Improved overall.  1+ edema noted in clinic.  Review echocardiogram demonstrating LVEF of 50 to 55%.  Questionable RWMA in the setting of a BBB.  Encourage patient to elevate her lower extremities when in a stationary position.  Consider compression stockings. 6. TLS monitoring  LDH and uric acid level inadvertently not drawn by lab.  Potassium stable at 4.0 mmol/L.  No evidence of TLS.  Continue prophylactic allopurinol 50 mg daily dose until treatment complete. 7. B12 deficiency  Continues on daily oral B12 supplementation (began 02/2018). 8. RTC on 08/10/18 for MD assessment, labs (CBC with diff, CMP, Mg), and cycle #5 mini-RCHOP + Udencya support.   Honor Loh, NP  07/20/2018, 8:08 AM

## 2018-07-21 ENCOUNTER — Inpatient Hospital Stay: Payer: Medicare Other

## 2018-07-21 ENCOUNTER — Other Ambulatory Visit: Payer: Self-pay | Admitting: *Deleted

## 2018-07-21 ENCOUNTER — Ambulatory Visit: Payer: Medicare Other

## 2018-07-21 VITALS — BP 126/75 | HR 72 | Temp 98.0°F | Resp 16

## 2018-07-21 DIAGNOSIS — C8339 Diffuse large B-cell lymphoma, extranodal and solid organ sites: Secondary | ICD-10-CM

## 2018-07-21 LAB — URIC ACID: Uric Acid, Serum: 5.5 mg/dL (ref 2.5–7.1)

## 2018-07-21 MED ORDER — ACETAMINOPHEN 325 MG PO TABS
650.0000 mg | ORAL_TABLET | Freq: Once | ORAL | Status: AC
  Administered 2018-07-21: 650 mg via ORAL
  Filled 2018-07-21: qty 2

## 2018-07-21 MED ORDER — SODIUM CHLORIDE 0.9 % IV SOLN
Freq: Once | INTRAVENOUS | Status: AC
  Administered 2018-07-21: 09:00:00 via INTRAVENOUS
  Filled 2018-07-21: qty 250

## 2018-07-21 MED ORDER — HEPARIN SOD (PORK) LOCK FLUSH 100 UNIT/ML IV SOLN
INTRAVENOUS | Status: AC
  Filled 2018-07-21: qty 5

## 2018-07-21 MED ORDER — SODIUM CHLORIDE 0.9 % IV SOLN
400.0000 mg/m2 | Freq: Once | INTRAVENOUS | Status: AC
  Administered 2018-07-21: 560 mg via INTRAVENOUS
  Filled 2018-07-21: qty 28

## 2018-07-21 MED ORDER — PEGFILGRASTIM 6 MG/0.6ML ~~LOC~~ PSKT
6.0000 mg | PREFILLED_SYRINGE | Freq: Once | SUBCUTANEOUS | Status: AC
  Administered 2018-07-21: 6 mg via SUBCUTANEOUS

## 2018-07-21 MED ORDER — SODIUM CHLORIDE 0.9% FLUSH
10.0000 mL | INTRAVENOUS | Status: DC | PRN
  Administered 2018-07-21: 10 mL
  Filled 2018-07-21: qty 10

## 2018-07-21 MED ORDER — VINCRISTINE SULFATE CHEMO INJECTION 1 MG/ML
1.0000 mg | Freq: Once | INTRAVENOUS | Status: AC
  Administered 2018-07-21: 1 mg via INTRAVENOUS
  Filled 2018-07-21: qty 1

## 2018-07-21 MED ORDER — SODIUM CHLORIDE 0.9 % IV SOLN: 375.0000 mg/m2 | Freq: Once | INTRAVENOUS | Status: DC

## 2018-07-21 MED ORDER — PALONOSETRON HCL INJECTION 0.25 MG/5ML
0.2500 mg | Freq: Once | INTRAVENOUS | Status: AC
  Administered 2018-07-21: 0.25 mg via INTRAVENOUS
  Filled 2018-07-21: qty 5

## 2018-07-21 MED ORDER — HEPARIN SOD (PORK) LOCK FLUSH 100 UNIT/ML IV SOLN
500.0000 [IU] | Freq: Once | INTRAVENOUS | Status: AC | PRN
  Administered 2018-07-21: 500 [IU]

## 2018-07-21 MED ORDER — DEXAMETHASONE SODIUM PHOSPHATE 10 MG/ML IJ SOLN
10.0000 mg | Freq: Once | INTRAMUSCULAR | Status: AC
  Administered 2018-07-21: 10 mg via INTRAVENOUS
  Filled 2018-07-21: qty 1

## 2018-07-21 MED ORDER — DIPHENHYDRAMINE HCL 25 MG PO CAPS
50.0000 mg | ORAL_CAPSULE | Freq: Once | ORAL | Status: AC
  Administered 2018-07-21: 50 mg via ORAL
  Filled 2018-07-21: qty 2

## 2018-07-21 MED ORDER — DOXORUBICIN HCL CHEMO IV INJECTION 2 MG/ML
25.0000 mg/m2 | Freq: Once | INTRAVENOUS | Status: AC
  Administered 2018-07-21: 34 mg via INTRAVENOUS
  Filled 2018-07-21: qty 17

## 2018-07-21 MED ORDER — SODIUM CHLORIDE 0.9 % IV SOLN
500.0000 mg | Freq: Once | INTRAVENOUS | Status: AC
  Administered 2018-07-21: 500 mg via INTRAVENOUS
  Filled 2018-07-21: qty 50

## 2018-07-24 ENCOUNTER — Other Ambulatory Visit: Payer: Self-pay | Admitting: Urgent Care

## 2018-07-24 ENCOUNTER — Other Ambulatory Visit: Payer: Self-pay | Admitting: *Deleted

## 2018-07-24 DIAGNOSIS — C8339 Diffuse large B-cell lymphoma, extranodal and solid organ sites: Secondary | ICD-10-CM

## 2018-07-24 MED ORDER — ONDANSETRON HCL 4 MG PO TABS: 4.0000 mg | ORAL_TABLET | Freq: Two times a day (BID) | ORAL | 3 refills | Status: DC | PRN

## 2018-07-24 NOTE — Telephone Encounter (Signed)
This was refill today already

## 2018-08-07 ENCOUNTER — Telehealth: Payer: Self-pay

## 2018-08-07 NOTE — Telephone Encounter (Signed)
Spoke with the patient to inform him of his appointment time, date and location. The patient got a call that told him that he need to be in the [place] office. I have given the patient the address to the location where he will need to be on Thursday.The patient was understanding and agreeable to keep schedule appointment.

## 2018-08-09 ENCOUNTER — Other Ambulatory Visit: Payer: Self-pay | Admitting: Urgent Care

## 2018-08-10 ENCOUNTER — Inpatient Hospital Stay: Payer: Medicare Other

## 2018-08-10 ENCOUNTER — Encounter: Payer: Self-pay | Admitting: Hematology and Oncology

## 2018-08-10 ENCOUNTER — Inpatient Hospital Stay (HOSPITAL_BASED_OUTPATIENT_CLINIC_OR_DEPARTMENT_OTHER): Payer: Medicare Other | Admitting: Hematology and Oncology

## 2018-08-10 ENCOUNTER — Other Ambulatory Visit: Payer: Self-pay | Admitting: Hematology and Oncology

## 2018-08-10 VITALS — BP 152/90 | HR 84 | Temp 98.7°F | Wt 115.1 lb

## 2018-08-10 DIAGNOSIS — R600 Localized edema: Secondary | ICD-10-CM

## 2018-08-10 DIAGNOSIS — C8339 Diffuse large B-cell lymphoma, extranodal and solid organ sites: Secondary | ICD-10-CM

## 2018-08-10 DIAGNOSIS — E538 Deficiency of other specified B group vitamins: Secondary | ICD-10-CM

## 2018-08-10 DIAGNOSIS — Z5111 Encounter for antineoplastic chemotherapy: Secondary | ICD-10-CM

## 2018-08-10 DIAGNOSIS — Z5112 Encounter for antineoplastic immunotherapy: Secondary | ICD-10-CM

## 2018-08-10 DIAGNOSIS — M329 Systemic lupus erythematosus, unspecified: Secondary | ICD-10-CM

## 2018-08-10 DIAGNOSIS — N189 Chronic kidney disease, unspecified: Secondary | ICD-10-CM | POA: Diagnosis not present

## 2018-08-10 LAB — COMPREHENSIVE METABOLIC PANEL
ALT: 15 U/L (ref 0–44)
ANION GAP: 9 (ref 5–15)
AST: 25 U/L (ref 15–41)
Albumin: 3.6 g/dL (ref 3.5–5.0)
Alkaline Phosphatase: 85 U/L (ref 38–126)
BUN: 27 mg/dL — ABNORMAL HIGH (ref 8–23)
CALCIUM: 8.5 mg/dL — AB (ref 8.9–10.3)
CO2: 27 mmol/L (ref 22–32)
Chloride: 103 mmol/L (ref 98–111)
Creatinine, Ser: 0.9 mg/dL (ref 0.44–1.00)
GFR calc Af Amer: >60 mL/min (ref >60)
GFR calc non Af Amer: >60 mL/min (ref >60)
Glucose, Bld: 105 mg/dL — ABNORMAL HIGH (ref 70–99)
Potassium: 3.7 mmol/L (ref 3.5–5.1)
Sodium: 139 mmol/L (ref 135–145)
Total Bilirubin: 0.2 mg/dL — ABNORMAL LOW (ref 0.3–1.2)
Total Protein: 6.2 g/dL — ABNORMAL LOW (ref 6.5–8.1)

## 2018-08-10 LAB — CBC WITH DIFFERENTIAL/PLATELET
Abs Immature Granulocytes: 0.07 10*3/uL (ref 0.00–0.07)
Basophils Absolute: 0 10*3/uL (ref 0.0–0.1)
Basophils Relative: 0 %
EOS ABS: 0.1 10*3/uL (ref 0.0–0.5)
Eosinophils Relative: 1 %
HCT: 33.6 % — ABNORMAL LOW (ref 36.0–46.0)
Hemoglobin: 10.7 g/dL — ABNORMAL LOW (ref 12.0–15.0)
Immature Granulocytes: 1 %
Lymphocytes Relative: 15 %
Lymphs Abs: 1 10*3/uL (ref 0.7–4.0)
MCH: 32.4 pg (ref 26.0–34.0)
MCHC: 31.8 g/dL (ref 30.0–36.0)
MCV: 101.8 fL — ABNORMAL HIGH (ref 80.0–100.0)
Monocytes Absolute: 1 10*3/uL (ref 0.1–1.0)
Monocytes Relative: 15 %
NEUTROS PCT: 68 %
Neutro Abs: 4.4 10*3/uL (ref 1.7–7.7)
Platelets: 205 10*3/uL (ref 150–400)
RBC: 3.3 MIL/uL — ABNORMAL LOW (ref 3.87–5.11)
RDW: 14.5 % (ref 11.5–15.5)
WBC: 6.5 10*3/uL (ref 4.0–10.5)
nRBC: 0 % (ref 0.0–0.2)

## 2018-08-10 LAB — LACTATE DEHYDROGENASE: LDH: 186 U/L (ref 98–192)

## 2018-08-10 LAB — MAGNESIUM: Magnesium: 2 mg/dL (ref 1.7–2.4)

## 2018-08-10 LAB — URIC ACID: Uric Acid, Serum: 4.9 mg/dL (ref 2.5–7.1)

## 2018-08-10 MED ORDER — HEPARIN SOD (PORK) LOCK FLUSH 100 UNIT/ML IV SOLN
500.0000 [IU] | Freq: Once | INTRAVENOUS | Status: AC
  Administered 2018-08-10: 500 [IU] via INTRAVENOUS
  Filled 2018-08-10: qty 5

## 2018-08-10 MED ORDER — SODIUM CHLORIDE 0.9 % IV SOLN: 375.0000 mg/m2 | Freq: Once | INTRAVENOUS | Status: DC

## 2018-08-10 MED ORDER — DEXAMETHASONE SODIUM PHOSPHATE 10 MG/ML IJ SOLN
10.0000 mg | Freq: Once | INTRAMUSCULAR | Status: AC
  Administered 2018-08-10: 10 mg via INTRAVENOUS
  Filled 2018-08-10: qty 1

## 2018-08-10 MED ORDER — PEGFILGRASTIM 6 MG/0.6ML ~~LOC~~ PSKT
6.0000 mg | PREFILLED_SYRINGE | Freq: Once | SUBCUTANEOUS | Status: AC
  Administered 2018-08-10: 6 mg via SUBCUTANEOUS
  Filled 2018-08-10: qty 0.6

## 2018-08-10 MED ORDER — PALONOSETRON HCL INJECTION 0.25 MG/5ML
0.2500 mg | Freq: Once | INTRAVENOUS | Status: AC
  Administered 2018-08-10: 0.25 mg via INTRAVENOUS
  Filled 2018-08-10: qty 5

## 2018-08-10 MED ORDER — SODIUM CHLORIDE 0.9 % IV SOLN
400.0000 mg/m2 | Freq: Once | INTRAVENOUS | Status: AC
  Administered 2018-08-10: 560 mg via INTRAVENOUS
  Filled 2018-08-10: qty 28

## 2018-08-10 MED ORDER — DIPHENHYDRAMINE HCL 25 MG PO CAPS
50.0000 mg | ORAL_CAPSULE | Freq: Once | ORAL | Status: AC
  Administered 2018-08-10: 50 mg via ORAL
  Filled 2018-08-10: qty 2

## 2018-08-10 MED ORDER — SODIUM CHLORIDE 0.9 % IV SOLN
Freq: Once | INTRAVENOUS | Status: AC
  Administered 2018-08-10: 10:00:00 via INTRAVENOUS
  Filled 2018-08-10: qty 250

## 2018-08-10 MED ORDER — SODIUM CHLORIDE 0.9% FLUSH
10.0000 mL | Freq: Once | INTRAVENOUS | Status: AC
  Administered 2018-08-10: 10 mL via INTRAVENOUS
  Filled 2018-08-10: qty 10

## 2018-08-10 MED ORDER — SODIUM CHLORIDE 0.9 % IV SOLN
500.0000 mg | Freq: Once | INTRAVENOUS | Status: AC
  Administered 2018-08-10: 500 mg via INTRAVENOUS
  Filled 2018-08-10: qty 50

## 2018-08-10 MED ORDER — ACETAMINOPHEN 325 MG PO TABS
650.0000 mg | ORAL_TABLET | Freq: Once | ORAL | Status: AC
  Administered 2018-08-10: 650 mg via ORAL
  Filled 2018-08-10: qty 2

## 2018-08-10 MED ORDER — VINCRISTINE SULFATE CHEMO INJECTION 1 MG/ML
1.0000 mg | Freq: Once | INTRAVENOUS | Status: AC
  Administered 2018-08-10: 1 mg via INTRAVENOUS
  Filled 2018-08-10: qty 1

## 2018-08-10 MED ORDER — DOXORUBICIN HCL CHEMO IV INJECTION 2 MG/ML
25.0000 mg/m2 | Freq: Once | INTRAVENOUS | Status: AC
  Administered 2018-08-10: 34 mg via INTRAVENOUS
  Filled 2018-08-10: qty 17

## 2018-08-10 NOTE — Progress Notes (Signed)
Pt here for follow up. Spouse reports patient is experiencing a "head cold and she just can't shake it" x "couple of weeks". States she has been using [place] for couple of weeks.

## 2018-08-10 NOTE — Progress Notes (Signed)
[facility] day:  08/10/2018  Chief Complaint: [name] is a 73 y.o. 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  HPI: The patient was last seen in the medical oncology clinic on 07/20/2018 by Honor Loh, NP.  At that time, she was doing well overall. She denied any acute concerns. Some intermittent nausea. Denied diarrhea and fevers. No infections. Persistent BLE (ankle edema). Rash had resolved. Exam revealed ankle edema.  WBC was 9300 ([place] 7300).  Hemoglobin was 11.3, hematocrit 36.1, MCV 100.6, and platelets 162,000.  BUN was 27 and creatinine 1.34 (CrCl 28.9 mL/min).  She received cycle #4 mini-RCHOP with On-Pro Neulasta support on 07/21/2018.  During the interim, patient has been doing well. She has experienced some very minor cold symptoms. No fevers. Patient has minor nausea, which has been controlled by the prescribed interventions. Patient denies that she has experienced any B symptoms. She denies any interval infections. She has minor swelling in her ankles. Malar rash to more pronounced.   Patient advises that she maintains an adequate appetite. She is eating well. Weight today is 115 lb 1.3 oz (52.2 kg), which compared to her last visit to the clinic, represents a stable weight.     Patient denies pain in the clinic today.   Past Medical History:  Diagnosis Date  . Cancer [facility])    Stomach cancer  . Chronic kidney disease   . GERD (gastroesophageal reflux disease)   . HTN (hypertension)   . Hyperlipidemia   . Lupus ([place])   . S/P angioplasty with stent     Past Surgical History:  Procedure Laterality Date  . CARDIAC SURGERY    . CESAREAN SECTION    . COLONOSCOPY N/A 12/30/2017   Procedure: COLONOSCOPY;  Surgeon: Lin Landsman, MD;  Location: [facility] ENDOSCOPY;  Service: Gastroenterology;  Laterality: N/A;  . CORONARY ARTERY BYPASS GRAFT  2003  .  ESOPHAGOGASTRODUODENOSCOPY N/A 12/30/2017   Procedure: ESOPHAGOGASTRODUODENOSCOPY (EGD);  Surgeon: Lin Landsman, MD;  Location: [facility] ENDOSCOPY;  Service: Gastroenterology;  Laterality: N/A;  . ESOPHAGOGASTRODUODENOSCOPY Left 02/05/2018   Procedure: ESOPHAGOGASTRODUODENOSCOPY (EGD);  Surgeon: Virgel Manifold, MD;  Location: [facility] ENDOSCOPY;  Service: Endoscopy;  Laterality: Left;  . ESOPHAGOGASTRODUODENOSCOPY (EGD) WITH PROPOFOL N/A 03/15/2018   Procedure: ESOPHAGOGASTRODUODENOSCOPY (EGD) WITH PROPOFOL;  Surgeon: Virgel Manifold, MD;  Location: [facility] ENDOSCOPY;  Service: Endoscopy;  Laterality: N/A;  . FLEXIBLE SIGMOIDOSCOPY N/A 12/31/2017   Procedure: FLEXIBLE SIGMOIDOSCOPY;  Surgeon: Jonathon Bellows, MD;  Location: [facility] ENDOSCOPY;  Service: Gastroenterology;  Laterality: N/A;  . PORTA CATH INSERTION N/A 04/19/2018   Procedure: PORTA CATH INSERTION;  Surgeon: Algernon Huxley, MD;  Location: [place] CV LAB;  Service: Cardiovascular;  Laterality: N/A;    Family History  Problem Relation Age of Onset  . Hypertension Mother   . Stroke Mother   . Vascular Disease Mother   . Hypertension Sister     Social History:  reports that she quit smoking about 16 years ago. Her smoking use included cigarettes. She has a 10.00 pack-year smoking history. She has never used smokeless tobacco. She reports previous alcohol use. She reports that she does not use drugs.  She has smoked 1/2 pack/day x 20 years.  She stopped smoking in 2000.  She denies any exposure to radiation or toxins.  She is from [place].  She moved to the [place] 52 years ago  after marrying her husband.  She was in the [employer].  The patient is accompanied by her husband today.  Allergies:  Allergies  Allergen Reactions  . Penicillins Other (See Comments)    Pass out Has patient had a PCN reaction causing immediate rash, facial/tongue/throat swelling, SOB or lightheadedness with hypotension: Yes Has patient had a PCN reaction  causing severe rash involving mucus membranes or skin necrosis: No Has patient had a PCN reaction that required hospitalization: No Has patient had a PCN reaction occurring within the last 10 years: No If all of the above answers are "NO", then may proceed with Cephalosporin use.   . Sulfa Antibiotics Nausea Only  . Sulfasalazine Nausea And Vomiting       . Ace Inhibitors Nausea Only  . Losartan Anxiety  . Spironolactone Other (See Comments)    Felt poorly but couldn't clarify     Current Medications: Current Outpatient Medications  Medication Sig Dispense Refill  . acetaminophen (TYLENOL) 500 MG tablet Take 1,000 mg by mouth 2 (two) times daily as needed for moderate pain or headache.    . alendronate (FOSAMAX) 70 MG tablet Take 70 mg by mouth every Saturday.     Marland Kitchen allopurinol (ZYLOPRIM) 100 MG tablet TAKE 1/2 TABLET BY MOUTH EVERY DAY 45 tablet 1  . amLODipine (NORVASC) 5 MG tablet Take 5 mg by mouth daily.     . clotrimazole-betamethasone (LOTRISONE) cream Apply 1 application topically 2 (two) times daily as needed (rash).     . feeding supplement, ENSURE ENLIVE, (ENSURE ENLIVE) LIQD Take 237 mLs by mouth 2 (two) times daily between meals. (Patient taking differently: Take 237 mLs by mouth daily. ) 90 Bottle 0  . ferrous sulfate 325 (65 FE) MG tablet Take 1 tablet (325 mg total) by mouth 2 (two) times daily with a meal. 60 tablet 3  . fluticasone (FLONASE) 50 MCG/ACT nasal spray Place 1 spray into both nostrils daily as needed for allergies or rhinitis.    . hydroxychloroquine (PLAQUENIL) 200 MG tablet Take 200 mg by mouth 2 (two) times daily.     Marland Kitchen lidocaine-prilocaine (EMLA) cream Apply to affected area once 30 g 3  . Multiple Vitamin (MULTIVITAMIN WITH MINERALS) TABS tablet Take 1 tablet by mouth daily. 180 tablet 0  . ondansetron (ZOFRAN) 4 MG tablet Take 1 tablet (4 mg total) by mouth 2 (two) times daily as needed for refractory nausea / vomiting. Start on day 3 after  cyclophosphamide chemotherapy. 30 tablet 3  . pantoprazole (PROTONIX) 40 MG tablet Take 1 tablet (40 mg total) by mouth 2 (two) times daily before a meal. 60 tablet 1  . polyethylene glycol (MIRALAX / GLYCOLAX) packet Take 17 g by mouth daily.    . predniSONE (DELTASONE) 20 MG tablet Take 3 tablets (60 mg total) by mouth daily. Take on days 1-5 of chemotherapy. 15 tablet 5  . predniSONE (DELTASONE) 5 MG tablet Take 5 mg by mouth.    . Pseudoephedrine-APAP 30-325 MG TABS Take 1 tablet by mouth daily as needed (sinuses).     . ranitidine (ZANTAC) 150 MG tablet Take 150 mg by mouth daily as needed for heartburn.     . simvastatin (ZOCOR) 40 MG tablet Take 40 mg by mouth daily.     . traMADol (ULTRAM) 50 MG tablet Take 50 mg by mouth 2 (two) times daily as needed for moderate pain.      No current facility-administered medications for this visit.    Facility-Administered Medications  Ordered in Other Visits  Medication Dose Route Frequency Provider Last Rate Last Dose  . heparin lock flush 100 unit/mL  500 Units Intravenous Once Lequita Asal, MD        Review of Systems  Constitutional: Negative.  Negative for chills, diaphoresis, fever, malaise/fatigue and weight loss (stable).       Feels "ok".  HENT: Negative.  Negative for ear discharge, ear pain, nosebleeds, sinus pain and sore throat.        "Little cold".  Eyes: Negative.  Negative for blurred vision, double vision and pain.  Respiratory: Negative.  Negative for cough, hemoptysis, sputum production and shortness of breath.   Cardiovascular: Negative.  Negative for chest pain, palpitations, leg swelling (chronic to BILATERAL ankles- improved) and PND.  Gastrointestinal: Positive for nausea (little). Negative for abdominal pain, blood in stool, constipation, diarrhea, heartburn, melena and vomiting.  Genitourinary: Negative.  Negative for dysuria, frequency, hematuria and urgency.  Musculoskeletal: Negative.  Negative for back pain,  falls, joint pain and myalgias.  Skin: Negative.  Negative for itching and rash.  Neurological: Negative.  Negative for dizziness, tingling, tremors, sensory change, focal weakness, weakness and headaches.  Endo/Heme/Allergies: Negative.  Does not bruise/bleed easily.  Psychiatric/Behavioral: Negative.  Negative for depression and memory loss. The patient is not nervous/anxious and does not have insomnia.   All other systems reviewed and are negative.  Performance status (ECOG): 1 Vital Signs: BP (!) 152/90 (BP Location: Left Arm, Patient Position: Sitting)   Pulse 84   Temp 98.7 F (37.1 C) (Tympanic)   Wt 115 lb 1.3 oz (52.2 kg)   SpO2 100%   BMI 23.24 kg/m   Physical Exam  Constitutional: She is oriented to person, place, and time and well-developed, well-nourished, and in no distress. No distress.  HENT:  Head: Normocephalic and atraumatic. Hair is abnormal (chemotherapy induced alopecia).  Mouth/Throat: Oropharynx is clear and moist and mucous membranes are normal. She has dentures. No oropharyngeal exudate.  Wearing a white crochet cap with short gray hair protruding.  Eyes: Pupils are equal, round, and reactive to light. Conjunctivae and EOM are normal. No scleral icterus.  Neck: Normal range of motion. Neck supple. No JVD present.  Cardiovascular: Normal rate, regular rhythm, normal heart sounds and intact distal pulses. Exam reveals no gallop and no friction rub.  No murmur heard. Pulmonary/Chest: Effort normal and breath sounds normal. No respiratory distress. She has no wheezes. She has no rales.  Port-a-cath to RIGHT chest wall without evidence of infection.  Abdominal: Soft. Bowel sounds are normal. She exhibits no distension and no mass. There is no abdominal tenderness. There is no rebound and no guarding.  Musculoskeletal: Normal range of motion.        General: Edema (trace BILATERAL ankles) present. No tenderness.  Lymphadenopathy:    She has no cervical  adenopathy.    She has no axillary adenopathy.       Right: No inguinal and no supraclavicular adenopathy present.       Left: No inguinal and no supraclavicular adenopathy present.  Neurological: She is alert and oriented to person, place, and time. Gait normal.  Skin: Skin is warm and dry. She is not diaphoretic. No erythema. No pallor.  Facial erythema (chronic).  Psychiatric: Mood, affect and judgment normal.  Nursing note and vitals reviewed.   Infusion on 08/10/2018  Component Date Value Ref Range Status  . LDH 08/10/2018 186  98 - 192 U/L Final   Performed at [facility], [address], [place], [place] [address]  . Uric Acid, Serum 08/10/2018 4.9  2.5 - 7.1 mg/dL Final   Performed at [facility], [address] [place], [place] [address]  . Magnesium 08/10/2018 2.0  1.7 - 2.4 mg/dL Final   Performed at [facility], [address] [place], [place] [address]  . Sodium 08/10/2018 139  135 - 145 mmol/L Final  . Potassium 08/10/2018 3.7  3.5 - 5.1 mmol/L Final  . Chloride 08/10/2018 103  98 - 111 mmol/L Final  . CO2 08/10/2018 27  22 - 32 mmol/L Final  . Glucose, Bld 08/10/2018 105* 70 - 99 mg/dL Final  . BUN 08/10/2018 27* 8 - 23 mg/dL Final  . Creatinine, Ser 08/10/2018 0.90  0.44 - 1.00 mg/dL Final  . Calcium 08/10/2018 8.5* 8.9 - 10.3 mg/dL Final  . Total Protein 08/10/2018 6.2* 6.5 - 8.1 g/dL Final  . Albumin 08/10/2018 3.6  3.5 - 5.0 g/dL Final  . AST 08/10/2018 25  15 - 41 U/L Final  . ALT 08/10/2018 15  0 - 44 U/L Final  . Alkaline Phosphatase 08/10/2018 85  38 - 126 U/L Final  . Total Bilirubin 08/10/2018 0.2* 0.3 - 1.2 mg/dL Final  . GFR calc non Af Amer 08/10/2018 >60  >60 mL/min Final  . GFR calc Af Amer 08/10/2018 >60  >60 mL/min Final  . Anion gap 08/10/2018 9  5 - 15 Final   Performed at [facility], [address], [place], [place] [address]  . WBC 08/10/2018 6.5  4.0 - 10.5 K/uL Final  . RBC  08/10/2018 3.30* 3.87 - 5.11 MIL/uL Final  . Hemoglobin 08/10/2018 10.7* 12.0 - 15.0 g/dL Final  . HCT 08/10/2018 33.6* 36.0 - 46.0 % Final  . MCV 08/10/2018 101.8* 80.0 - 100.0 fL Final  . MCH 08/10/2018 32.4  26.0 - 34.0 pg Final  . MCHC 08/10/2018 31.8  30.0 - 36.0 g/dL Final  . RDW 08/10/2018 14.5  11.5 - 15.5 % Final  . Platelets 08/10/2018 205  150 - 400 K/uL Final  . nRBC 08/10/2018 0.0  0.0 - 0.2 % Final  . Neutrophils Relative % 08/10/2018 68  % Final  . Neutro Abs 08/10/2018 4.4  1.7 - 7.7 K/uL Final  . Lymphocytes Relative 08/10/2018 15  % Final  . Lymphs Abs 08/10/2018 1.0  0.7 - 4.0 K/uL Final  . Monocytes Relative 08/10/2018 15  % Final  . Monocytes Absolute 08/10/2018 1.0  0.1 - 1.0 K/uL Final  . Eosinophils Relative 08/10/2018 1  % Final  . Eosinophils Absolute 08/10/2018 0.1  0.0 - 0.5 K/uL Final  . Basophils Relative 08/10/2018 0  % Final  . Basophils Absolute 08/10/2018 0.0  0.0 - 0.1 K/uL Final  . Immature Granulocytes 08/10/2018 1  % Final  . Abs Immature Granulocytes 08/10/2018 0.07  0.00 - 0.07 K/uL Final   Performed at [facility], [address] [place], [place] [address]    Assessment:  In Addalynne Golding is a 73 y.o. 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  IPI is 3 (intermediate-high risk), age-adjusted IPI is 2 (low-intermediate risk), revised IPI is 3 (poor), and NCCN-IPI is 5 (intermediate-high risk).  CNS risk is 3 (intermediate: age, LDH, stage).  EGD on 03/15/2018 revealed pathology from the duodenal scalloped lesion and duodenal sweep ulcer with involvement with diffuse large B cell  lymphoma.  Random gastric biopsy was negative for lymphoma.  There was mild chronic gastritis with reactive foveolar hyperplasia.  Gastric fundus scar revealed involvement with diffuse large B cell lymphoma.  GE junction biopsy revealed no lymphoma with squamocolumnar mucosa with mild chronic active  inflammation.  The lymphoma has a non-germinal center immunophenotype with a Ki-67 proliferation index of 60% and shows Bcl-2/CMYC co-expression by  immunohistochemical analysis.   Summary of IHC results, neoplastic cells: Positive: PAX-5, CD20(variable), Bcl-2, MUM1, Bcl-6 (variable), CMYC.  Negative: CD10, CD5, CD30, CD138, Cyclin-D1, ALK-1, CD3.  EBV (ISH): Negative.  FISH for MYC gene rearrangement (8q24 gene region) was negative.  PET scan on 04/06/2018 revealed enlarged and hypermetabolic supraclavicular, mediastinal, abdominal and pelvic adenopathy. Additional smaller hypermetabolic lymph nodes identified within the left cervical lymph node chain. Largest lymph node was a 5.7 cm subcarinal lymph node (SUV 26.78).  Bone marrow aspirate and biopsy on 04/12/2018 revealed a slightly hypercellular bone marrow for age with trilineage hematopoiesis.  There were dyspoietic changes associated with the granulocyte series.  Flow cytometry revealed a predominance of T lymphocytes with relative abundance of CD8 + cells.  There was no significant B cell population.  Cytogenetics are pending.  Echo on 04/04/2018 revealed an EF of 55-60%.  She presented with a normocytic anemia.  She was admitted x 2 (12/2017 and 01/2018) with rectal bleeding.  She is intolerant of oral iron.  Diet is modest.  EGD on 12/30/2017 revealed non bleeding gastric erosions in the antrum.  Colonoscopy on 12/30/2017 revealed numerous polyps (one 5 mm in ascending colon, two 7 mm polyps in transverse colon, five 5-7 mm polyps in the descending colon, one 7 mm polyp in the sigmoid colon, one 8 mm polyp in the recto-sigmoid colon, and one 19 mm polyp in the rectum). 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    She developed hematochezia post colonoscopy.  Flexible sigmoidoscopy on 12/31/2017 revealed a rectal ulcer at the site of the polypectomy and blood in the rectum.  Clips were placed.  EGD on  02/05/2018 revealed 2 bleeding AVMs (treated with APC) in the small bowel with scalloped/denuded mucosa underneath the bleeding sites.  Work-up on 04/03/2018 revealed a hematocrit of 32.5, hemoglobin 10.7, MCV 91.7, platelets 207,000, WBC 1800 with an ANC of 600.  Uric acid was 8.8.  LDH was 241 (98-192) and beta2-microglobulin  5.8 (0.6-2.4).  Negative studies included:  hepatitis B core antibody total, hepatitis B surface antigen, hepatitis C antibody, HIV testing.     She has required 4 units of PRBCs (3 units in 12/2017 and 1 unit in 01/2018).  Ferritin has been followed: 30 on 12/29/2017, 93 on 02/13/2018, 57 on 03/07/2018, and 38 on 04/03/2018.  Labs on 12/29/2017 revealed the following:  B12 was 254 (low normal).  She has been on oral B12 since 03/07/2018.  B12 was 1524 on 04/03/2018.  Folate 6.3 (> 5.9) on 12/29/2017 and 55.8 on 04/03/2018.    She is s/p 4 cycles of mini-RCHOP (04/24/2018 - 06/07/2018; 07/21/2018) with Margarette Canada support.  Cycle #1 platelet nadir was 82,000. Cycle #4 was postponed on 07/13/2018 ([place] 0).   Chest, abdomen, and pelvic CT on 07/03/2018 revealed marked interval response to therapy. Previously identified relatively bulky lymphadenopathy in the chest, abdomen, and pelvis had resolved with no pathologically enlarged lymphadenopathy.  There was no new or progressive interval findings.  She has chronic renal insufficiency.  Creatinine was 1.42 (CrCl 25 ml/min) on 04/03/2018.  She has lupus (  diagnosed 1982). She is on prednisone 5 mg a day and Plaquenil.   Symptomatically, she is doing well.  She has a minor URI.  She denies any B symptoms.  Exam is unremarkable.  Plan: 1. Labs today: CBC with diff, CMP, LDH, uric acid, Mg. 2. Stage IVB diffuse large B cell lymphoma Clinically she is doing well. Counts have recovered and are adequate for treatment. Cycle #5 mini-RCHOP today with On-Pro Neulasta Discuss symptom management.  She has antiemetics and pain medications  at home to use on a prn bases.  Interventions are adequate.    3. Renal insufficiency secondary to lupus Labs reviewed. BUN 27 and creatinine 0.9. Continue good hydration. 4. BILATERAL lower extremity edema Trace edema on exam (improved). Continue to monitor. 5. TLS monitoring No evidence of TLS. Discuss continuation of prophylactic allopurinol until treatment complete. 6. B12 deficiency Continue daily oral B12 (began 02/2018). 7.   RTC on 08/31/2018 for MD assessment, labs (CBC with diff, CMP, Mg), and cycle #6 mini-RCHOP + OnPro Neulasta   Honor Loh, NP  08/10/2018, 9:50 AM   I saw and evaluated the patient, participating in the key portions of the service and reviewing pertinent diagnostic studies and records.  I reviewed the nurse practitioner's note and agree with the findings and the plan.  The assessment and plan were discussed with the patient.  Several questions were asked by the patient and answered.   Nolon Stalls, MD 08/10/2018,9:50 AM

## 2018-08-11 ENCOUNTER — Ambulatory Visit: Payer: Medicare Other

## 2018-08-31 ENCOUNTER — Inpatient Hospital Stay: Payer: Medicare Other

## 2018-08-31 ENCOUNTER — Other Ambulatory Visit: Payer: Self-pay | Admitting: Hematology and Oncology

## 2018-08-31 ENCOUNTER — Encounter: Payer: Self-pay | Admitting: Hematology and Oncology

## 2018-08-31 ENCOUNTER — Inpatient Hospital Stay: Payer: Medicare Other | Attending: Hematology and Oncology | Admitting: Hematology and Oncology

## 2018-08-31 VITALS — BP 126/73 | HR 78 | Temp 96.3°F | Resp 16

## 2018-08-31 VITALS — BP 116/74 | HR 81 | Temp 98.0°F | Resp 16 | Wt 114.7 lb

## 2018-08-31 DIAGNOSIS — M329 Systemic lupus erythematosus, unspecified: Secondary | ICD-10-CM | POA: Insufficient documentation

## 2018-08-31 DIAGNOSIS — Z5111 Encounter for antineoplastic chemotherapy: Secondary | ICD-10-CM

## 2018-08-31 DIAGNOSIS — K219 Gastro-esophageal reflux disease without esophagitis: Secondary | ICD-10-CM | POA: Insufficient documentation

## 2018-08-31 DIAGNOSIS — Z7689 Persons encountering health services in other specified circumstances: Secondary | ICD-10-CM | POA: Insufficient documentation

## 2018-08-31 DIAGNOSIS — C83398 Diffuse large b-cell lymphoma of other extranodal and solid organ sites: Secondary | ICD-10-CM

## 2018-08-31 DIAGNOSIS — Z87891 Personal history of nicotine dependence: Secondary | ICD-10-CM | POA: Diagnosis not present

## 2018-08-31 DIAGNOSIS — N189 Chronic kidney disease, unspecified: Secondary | ICD-10-CM | POA: Diagnosis not present

## 2018-08-31 DIAGNOSIS — I129 Hypertensive chronic kidney disease with stage 1 through stage 4 chronic kidney disease, or unspecified chronic kidney disease: Secondary | ICD-10-CM | POA: Diagnosis not present

## 2018-08-31 DIAGNOSIS — E538 Deficiency of other specified B group vitamins: Secondary | ICD-10-CM | POA: Diagnosis not present

## 2018-08-31 DIAGNOSIS — C8338 Diffuse large B-cell lymphoma, lymph nodes of multiple sites: Secondary | ICD-10-CM | POA: Insufficient documentation

## 2018-08-31 DIAGNOSIS — E785 Hyperlipidemia, unspecified: Secondary | ICD-10-CM | POA: Diagnosis not present

## 2018-08-31 DIAGNOSIS — Z951 Presence of aortocoronary bypass graft: Secondary | ICD-10-CM | POA: Diagnosis not present

## 2018-08-31 DIAGNOSIS — Z5112 Encounter for antineoplastic immunotherapy: Secondary | ICD-10-CM | POA: Diagnosis present

## 2018-08-31 DIAGNOSIS — C8339 Diffuse large B-cell lymphoma, extranodal and solid organ sites: Secondary | ICD-10-CM

## 2018-08-31 LAB — CBC WITH DIFFERENTIAL/PLATELET
Abs Immature Granulocytes: 1.56 10*3/uL — ABNORMAL HIGH (ref 0.00–0.07)
Basophils Absolute: 0.1 10*3/uL (ref 0.0–0.1)
Basophils Relative: 0 %
Eosinophils Absolute: 0 10*3/uL (ref 0.0–0.5)
Eosinophils Relative: 0 %
HCT: 32.6 % — ABNORMAL LOW (ref 36.0–46.0)
Hemoglobin: 10.5 g/dL — ABNORMAL LOW (ref 12.0–15.0)
Immature Granulocytes: 11 %
Lymphocytes Relative: 8 %
Lymphs Abs: 1 10*3/uL (ref 0.7–4.0)
MCH: 33.2 pg (ref 26.0–34.0)
MCHC: 32.2 g/dL (ref 30.0–36.0)
MCV: 103.2 fL — ABNORMAL HIGH (ref 80.0–100.0)
Monocytes Absolute: 0.8 10*3/uL (ref 0.1–1.0)
Monocytes Relative: 6 %
Neutro Abs: 10.3 10*3/uL — ABNORMAL HIGH (ref 1.7–7.7)
Neutrophils Relative %: 75 %
Platelets: 162 10*3/uL (ref 150–400)
RBC: 3.16 MIL/uL — ABNORMAL LOW (ref 3.87–5.11)
RDW: 15.1 % (ref 11.5–15.5)
WBC: 13.8 10*3/uL — ABNORMAL HIGH (ref 4.0–10.5)
nRBC: 0 % (ref 0.0–0.2)

## 2018-08-31 LAB — COMPREHENSIVE METABOLIC PANEL
ALT: 14 U/L (ref 0–44)
AST: 27 U/L (ref 15–41)
Albumin: 3.8 g/dL (ref 3.5–5.0)
Alkaline Phosphatase: 81 U/L (ref 38–126)
Anion gap: 8 (ref 5–15)
BUN: 24 mg/dL — ABNORMAL HIGH (ref 8–23)
CO2: 24 mmol/L (ref 22–32)
Calcium: 8.6 mg/dL — ABNORMAL LOW (ref 8.9–10.3)
Chloride: 108 mmol/L (ref 98–111)
Creatinine, Ser: 0.97 mg/dL (ref 0.44–1.00)
GFR calc Af Amer: >60 mL/min (ref >60)
GFR calc non Af Amer: 59 mL/min — ABNORMAL LOW (ref >60)
Glucose, Bld: 111 mg/dL — ABNORMAL HIGH (ref 70–99)
Potassium: 3.5 mmol/L (ref 3.5–5.1)
Sodium: 140 mmol/L (ref 135–145)
Total Bilirubin: 0.6 mg/dL (ref 0.3–1.2)
Total Protein: 6.1 g/dL — ABNORMAL LOW (ref 6.5–8.1)

## 2018-08-31 LAB — MAGNESIUM: Magnesium: 2.1 mg/dL (ref 1.7–2.4)

## 2018-08-31 LAB — LACTATE DEHYDROGENASE: LDH: 235 U/L — ABNORMAL HIGH (ref 98–192)

## 2018-08-31 LAB — URIC ACID: Uric Acid, Serum: 6.4 mg/dL (ref 2.5–7.1)

## 2018-08-31 MED ORDER — DIPHENHYDRAMINE HCL 25 MG PO CAPS
50.0000 mg | ORAL_CAPSULE | Freq: Once | ORAL | Status: AC
  Administered 2018-08-31: 50 mg via ORAL
  Filled 2018-08-31: qty 2

## 2018-08-31 MED ORDER — PEGFILGRASTIM 6 MG/0.6ML ~~LOC~~ PSKT
6.0000 mg | PREFILLED_SYRINGE | Freq: Once | SUBCUTANEOUS | Status: AC
  Administered 2018-08-31: 6 mg via SUBCUTANEOUS

## 2018-08-31 MED ORDER — VINCRISTINE SULFATE CHEMO INJECTION 1 MG/ML
1.0000 mg | Freq: Once | INTRAVENOUS | Status: AC
  Administered 2018-08-31: 1 mg via INTRAVENOUS
  Filled 2018-08-31: qty 1

## 2018-08-31 MED ORDER — HEPARIN SOD (PORK) LOCK FLUSH 100 UNIT/ML IV SOLN
500.0000 [IU] | Freq: Once | INTRAVENOUS | Status: AC
  Administered 2018-08-31: 500 [IU] via INTRAVENOUS
  Filled 2018-08-31: qty 5

## 2018-08-31 MED ORDER — PALONOSETRON HCL INJECTION 0.25 MG/5ML
0.2500 mg | Freq: Once | INTRAVENOUS | Status: AC
  Administered 2018-08-31: 0.25 mg via INTRAVENOUS
  Filled 2018-08-31: qty 5

## 2018-08-31 MED ORDER — DEXAMETHASONE SODIUM PHOSPHATE 10 MG/ML IJ SOLN
INTRAMUSCULAR | Status: AC
  Filled 2018-08-31: qty 1

## 2018-08-31 MED ORDER — SODIUM CHLORIDE 0.9 % IV SOLN
400.0000 mg/m2 | Freq: Once | INTRAVENOUS | Status: AC
  Administered 2018-08-31: 560 mg via INTRAVENOUS
  Filled 2018-08-31: qty 28

## 2018-08-31 MED ORDER — DEXAMETHASONE SODIUM PHOSPHATE 10 MG/ML IJ SOLN
10.0000 mg | Freq: Once | INTRAMUSCULAR | Status: AC
  Administered 2018-08-31: 10 mg via INTRAVENOUS

## 2018-08-31 MED ORDER — SODIUM CHLORIDE 0.9 % IV SOLN
500.0000 mg | Freq: Once | INTRAVENOUS | Status: AC
  Administered 2018-08-31: 500 mg via INTRAVENOUS
  Filled 2018-08-31: qty 50

## 2018-08-31 MED ORDER — SODIUM CHLORIDE 0.9 % IV SOLN
Freq: Once | INTRAVENOUS | Status: AC
  Administered 2018-08-31: 11:00:00 via INTRAVENOUS
  Filled 2018-08-31: qty 250

## 2018-08-31 MED ORDER — DOXORUBICIN HCL CHEMO IV INJECTION 2 MG/ML
25.0000 mg/m2 | Freq: Once | INTRAVENOUS | Status: AC
  Administered 2018-08-31: 34 mg via INTRAVENOUS
  Filled 2018-08-31: qty 17

## 2018-08-31 MED ORDER — SODIUM CHLORIDE 0.9% FLUSH
10.0000 mL | INTRAVENOUS | Status: DC | PRN
  Administered 2018-08-31: 10 mL via INTRAVENOUS
  Filled 2018-08-31: qty 10

## 2018-08-31 MED ORDER — ACETAMINOPHEN 325 MG PO TABS
650.0000 mg | ORAL_TABLET | Freq: Once | ORAL | Status: AC
  Administered 2018-08-31: 650 mg via ORAL
  Filled 2018-08-31: qty 2

## 2018-08-31 MED ORDER — SODIUM CHLORIDE 0.9 % IV SOLN: 375.0000 mg/m2 | Freq: Once | INTRAVENOUS | Status: DC

## 2018-08-31 NOTE — Progress Notes (Signed)
Pt here for follow up. Denies any concerns at this time.  

## 2018-08-31 NOTE — Progress Notes (Signed)
[facility] day:  08/31/2018  Chief Complaint: [name] is a 73 y.o. 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  HPI: The patient was last seen in the medical oncology clinic on 08/10/2018.  At that time, she was doing well.  She had minor cold symptoms.  She received cycle #5 mini-RCHOP with On-Pro Neulasta.  During the interim, she has felt "very good".  She is eating well.  She denies any nausea, vomiting or diarrhea.  She denies any B symptoms.  She denies any neuropathy or foot drop.   Past Medical History:  Diagnosis Date  . Cancer [facility])    Stomach cancer  . Chronic kidney disease   . GERD (gastroesophageal reflux disease)   . HTN (hypertension)   . Hyperlipidemia   . Lupus ([place])   . S/P angioplasty with stent     Past Surgical History:  Procedure Laterality Date  . CARDIAC SURGERY    . CESAREAN SECTION    . COLONOSCOPY N/A 12/30/2017   Procedure: COLONOSCOPY;  Surgeon: Lin Landsman, MD;  Location: [facility] ENDOSCOPY;  Service: Gastroenterology;  Laterality: N/A;  . CORONARY ARTERY BYPASS GRAFT  2003  . ESOPHAGOGASTRODUODENOSCOPY N/A 12/30/2017   Procedure: ESOPHAGOGASTRODUODENOSCOPY (EGD);  Surgeon: Lin Landsman, MD;  Location: [facility] ENDOSCOPY;  Service: Gastroenterology;  Laterality: N/A;  . ESOPHAGOGASTRODUODENOSCOPY Left 02/05/2018   Procedure: ESOPHAGOGASTRODUODENOSCOPY (EGD);  Surgeon: Virgel Manifold, MD;  Location: [facility] ENDOSCOPY;  Service: Endoscopy;  Laterality: Left;  . ESOPHAGOGASTRODUODENOSCOPY (EGD) WITH PROPOFOL N/A 03/15/2018   Procedure: ESOPHAGOGASTRODUODENOSCOPY (EGD) WITH PROPOFOL;  Surgeon: Virgel Manifold, MD;  Location: [facility] ENDOSCOPY;  Service: Endoscopy;  Laterality: N/A;  . FLEXIBLE SIGMOIDOSCOPY N/A 12/31/2017   Procedure: FLEXIBLE SIGMOIDOSCOPY;  Surgeon: Jonathon Bellows, MD;  Location[facility] ENDOSCOPY;  Service:  Gastroenterology;  Laterality: N/A;  . PORTA CATH INSERTION N/A 04/19/2018   Procedure: PORTA CATH INSERTION;  Surgeon: Algernon Huxley, MD;  Location: [place] CV LAB;  Service: Cardiovascular;  Laterality: N/A;    Family History  Problem Relation Age of Onset  . Hypertension Mother   . Stroke Mother   . Vascular Disease Mother   . Hypertension Sister     Social History:  reports that she quit smoking about 16 years ago. Her smoking use included cigarettes. She has a 10.00 pack-year smoking history. She has never used smokeless tobacco. She reports previous alcohol use. She reports that she does not use drugs.  She has smoked 1/2 pack/day x 20 years.  She stopped smoking in 2000.  She denies any exposure to radiation or toxins.  She is from [place].  She moved to the [place] 52 years ago after marrying her husband.  She was in the [employer].  The patient is accompanied by her husband today.  Allergies:  Allergies  Allergen Reactions  . Penicillins Other (See Comments)    Pass out Has patient had a PCN reaction causing immediate rash, facial/tongue/throat swelling, SOB or lightheadedness with hypotension: Yes Has patient had a PCN reaction causing severe rash involving mucus membranes or skin necrosis: No Has patient had a PCN reaction that required hospitalization: No Has patient had a PCN reaction occurring within the last 10 years: No If all of the above answers are "NO", then may proceed with Cephalosporin use.   . Sulfa Antibiotics Nausea Only  . Sulfasalazine Nausea And Vomiting       .  Ace Inhibitors Nausea Only  . Losartan Anxiety  . Spironolactone Other (See Comments)    Felt poorly but couldn't clarify     Current Medications: Current Outpatient Medications  Medication Sig Dispense Refill  . acetaminophen (TYLENOL) 500 MG tablet Take 1,000 mg by mouth 2 (two) times daily as needed for moderate pain or headache.    . alendronate (FOSAMAX) 70 MG tablet Take 70 mg by  mouth every Saturday.     Marland Kitchen allopurinol (ZYLOPRIM) 100 MG tablet TAKE 1/2 TABLET BY MOUTH EVERY DAY 45 tablet 1  . amLODipine (NORVASC) 10 MG tablet Take 10 mg by mouth daily.    . clotrimazole-betamethasone (LOTRISONE) cream Apply 1 application topically 2 (two) times daily as needed (rash).     . feeding supplement, ENSURE ENLIVE, (ENSURE ENLIVE) LIQD Take 237 mLs by mouth 2 (two) times daily between meals. (Patient taking differently: Take 237 mLs by mouth daily. ) 90 Bottle 0  . ferrous sulfate 325 (65 FE) MG tablet Take 1 tablet (325 mg total) by mouth 2 (two) times daily with a meal. 60 tablet 3  . fluticasone (FLONASE) 50 MCG/ACT nasal spray Place 1 spray into both nostrils daily as needed for allergies or rhinitis.    . hydroxychloroquine (PLAQUENIL) 200 MG tablet Take 200 mg by mouth 2 (two) times daily.     Marland Kitchen lidocaine-prilocaine (EMLA) cream Apply to affected area once 30 g 3  . Multiple Vitamin (MULTIVITAMIN WITH MINERALS) TABS tablet Take 1 tablet by mouth daily. 180 tablet 0  . ondansetron (ZOFRAN) 4 MG tablet Take 1 tablet (4 mg total) by mouth 2 (two) times daily as needed for refractory nausea / vomiting. Start on day 3 after cyclophosphamide chemotherapy. 30 tablet 3  . polyethylene glycol (MIRALAX / GLYCOLAX) packet Take 17 g by mouth daily.    . predniSONE (DELTASONE) 20 MG tablet Take 3 tablets (60 mg total) by mouth daily. Take on days 1-5 of chemotherapy. 15 tablet 5  . predniSONE (DELTASONE) 5 MG tablet Take 5 mg by mouth daily with breakfast.     . Pseudoephedrine-APAP 30-325 MG TABS Take 1 tablet by mouth daily as needed (sinuses).     . ranitidine (ZANTAC) 150 MG tablet Take 150 mg by mouth daily as needed for heartburn.     . simvastatin (ZOCOR) 40 MG tablet Take 40 mg by mouth daily.     . traMADol (ULTRAM) 50 MG tablet Take 50 mg by mouth 2 (two) times daily as needed for moderate pain.      No current facility-administered medications for this visit.     Review of  Systems  Constitutional: Positive for weight loss (1 pound). Negative for chills, fever and malaise/fatigue.       Feels "very good".  HENT: Negative.  Negative for congestion, ear discharge, ear pain, nosebleeds, sinus pain and sore throat.   Eyes: Negative.  Negative for blurred vision, double vision, photophobia and pain.  Respiratory: Negative.  Negative for cough, hemoptysis, sputum production and shortness of breath.   Cardiovascular: Negative.  Negative for chest pain, palpitations, orthopnea, leg swelling (chronic BILATERAL ankles) and PND.  Gastrointestinal: Negative.  Negative for abdominal pain, blood in stool, constipation, diarrhea, heartburn, melena, nausea and vomiting.  Genitourinary: Negative.  Negative for dysuria, frequency, hematuria and urgency.  Musculoskeletal: Negative.  Negative for back pain, falls, joint pain, myalgias and neck pain.  Skin: Negative.  Negative for itching and rash.  Neurological: Negative.  Negative for dizziness, tingling,  tremors, sensory change, speech change, focal weakness, weakness and headaches.  Endo/Heme/Allergies: Negative.  Does not bruise/bleed easily.  Psychiatric/Behavioral: Negative.  Negative for depression and memory loss. The patient is not nervous/anxious and does not have insomnia.   All other systems reviewed and are negative.  Performance status (ECOG): 1  Vital Signs: BP 116/74 (BP Location: Left Arm, Patient Position: Sitting)   Pulse 81   Temp 98 F (36.7 C) (Oral)   Resp 16   Wt 114 lb 12 oz (52 kg)   SpO2 100%   BMI 23.18 kg/m   Physical Exam  Constitutional: She is oriented to person, place, and time and well-developed, well-nourished, and in no distress. No distress.  HENT:  Head: Normocephalic and atraumatic.  Mouth/Throat: Oropharynx is clear and moist and mucous membranes are normal. She has dentures. No oropharyngeal exudate.  Wearing a white pom-pom hat.  Eyes: Pupils are equal, round, and reactive to  light. Conjunctivae and EOM are normal. No scleral icterus.  Neck: Neck supple. No JVD present.  Cardiovascular: Normal rate, regular rhythm, normal heart sounds and intact distal pulses. Exam reveals no gallop and no friction rub.  No murmur heard. Pulmonary/Chest: Effort normal and breath sounds normal. No respiratory distress. She has no wheezes. She has no rales.  Port-a-cath to RIGHT chest wall; accessed; no evidence of infection.  Abdominal: Soft. Bowel sounds are normal. She exhibits no distension and no mass. There is no abdominal tenderness. There is no rebound and no guarding.  Musculoskeletal: Normal range of motion.        General: No tenderness or edema.  Lymphadenopathy:    She has no cervical adenopathy.    She has no axillary adenopathy.       Right: No supraclavicular adenopathy present.       Left: No supraclavicular adenopathy present.  Neurological: She is alert and oriented to person, place, and time. Gait normal.  Reflex Scores:      Patellar reflexes are 1+ on the right side and 1+ on the left side. Able to walk on heels.  Skin: Skin is warm and dry. No rash noted. She is not diaphoretic. No erythema. No pallor.  Facial erythema-chronic.  Psychiatric: Mood, affect and judgment normal.  Nursing note and vitals reviewed.   Infusion on 08/31/2018  Component Date Value Ref Range Status  . Uric Acid, Serum 08/31/2018 6.4  2.5 - 7.1 mg/dL Final   Performed at [facility], [address] [place], [place] [address]  . LDH 08/31/2018 235* 98 - 192 U/L Final   Performed at [facility], [address] [place], [place] [address]  . Magnesium 08/31/2018 2.1  1.7 - 2.4 mg/dL Final   Performed at [facility], [address], [place], [place] [address]  . Sodium 08/31/2018 140  135 - 145 mmol/L Final  . Potassium 08/31/2018 3.5  3.5 - 5.1 mmol/L Final  . Chloride 08/31/2018 108  98 - 111 mmol/L Final  . CO2 08/31/2018 24  22 - 32  mmol/L Final  . Glucose, Bld 08/31/2018 111* 70 - 99 mg/dL Final  . BUN 08/31/2018 24* 8 - 23 mg/dL Final  . Creatinine, Ser 08/31/2018 0.97  0.44 - 1.00 mg/dL Final  . Calcium 08/31/2018 8.6* 8.9 - 10.3 mg/dL Final  . Total Protein 08/31/2018 6.1* 6.5 - 8.1 g/dL Final  . Albumin 08/31/2018 3.8  3.5 - 5.0 g/dL Final  . AST 08/31/2018 27  15 -  41 U/L Final  . ALT 08/31/2018 14  0 - 44 U/L Final  . Alkaline Phosphatase 08/31/2018 81  38 - 126 U/L Final  . Total Bilirubin 08/31/2018 0.6  0.3 - 1.2 mg/dL Final  . GFR calc non Af Amer 08/31/2018 59* >60 mL/min Final  . GFR calc Af Amer 08/31/2018 >60  >60 mL/min Final  . Anion gap 08/31/2018 8  5 - 15 Final   Performed at [facility], [address], [place], [place] [address]  . WBC 08/31/2018 13.8* 4.0 - 10.5 K/uL Final  . RBC 08/31/2018 3.16* 3.87 - 5.11 MIL/uL Final  . Hemoglobin 08/31/2018 10.5* 12.0 - 15.0 g/dL Final  . HCT 08/31/2018 32.6* 36.0 - 46.0 % Final  . MCV 08/31/2018 103.2* 80.0 - 100.0 fL Final  . MCH 08/31/2018 33.2  26.0 - 34.0 pg Final  . MCHC 08/31/2018 32.2  30.0 - 36.0 g/dL Final  . RDW 08/31/2018 15.1  11.5 - 15.5 % Final  . Platelets 08/31/2018 162  150 - 400 K/uL Final  . nRBC 08/31/2018 0.0  0.0 - 0.2 % Final  . Neutrophils Relative % 08/31/2018 75  % Final  . Neutro Abs 08/31/2018 10.3* 1.7 - 7.7 K/uL Final  . Lymphocytes Relative 08/31/2018 8  % Final  . Lymphs Abs 08/31/2018 1.0  0.7 - 4.0 K/uL Final  . Monocytes Relative 08/31/2018 6  % Final  . Monocytes Absolute 08/31/2018 0.8  0.1 - 1.0 K/uL Final  . Eosinophils Relative 08/31/2018 0  % Final  . Eosinophils Absolute 08/31/2018 0.0  0.0 - 0.5 K/uL Final  . Basophils Relative 08/31/2018 0  % Final  . Basophils Absolute 08/31/2018 0.1  0.0 - 0.1 K/uL Final  . Immature Granulocytes 08/31/2018 11  % Final  . Abs Immature Granulocytes 08/31/2018 1.56* 0.00 - 0.07 K/uL Final   Performed at [facility], [address]  [place], [place] [address]    Assessment:  [name] is a 73 y.o. 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  IPI is 3 (intermediate-high risk), age-adjusted IPI is 2 (low-intermediate risk), revised IPI is 3 (poor), and NCCN-IPI is 5 (intermediate-high risk).  CNS risk is 3 (intermediate: age, LDH, stage).  EGD on 03/15/2018 revealed pathology from the duodenal scalloped lesion and duodenal sweep ulcer with involvement with diffuse large B cell lymphoma.  Random gastric biopsy was negative for lymphoma.  There was mild chronic gastritis with reactive foveolar hyperplasia.  Gastric fundus scar revealed involvement with diffuse large B cell lymphoma.  GE junction biopsy revealed no lymphoma with squamocolumnar mucosa with mild chronic active inflammation.  The lymphoma has a non-germinal center immunophenotype with a Ki-67 proliferation index of 60% and shows Bcl-2/CMYC co-expression by  immunohistochemical analysis.   Summary of IHC results, neoplastic cells: Positive: PAX-5, CD20(variable), Bcl-2, MUM1, Bcl-6 (variable), CMYC.  Negative: CD10, CD5, CD30, CD138, Cyclin-D1, ALK-1, CD3.  EBV (ISH): Negative.  FISH for MYC gene rearrangement (8q24 gene region) was negative.  PET scan on 04/06/2018 revealed enlarged and hypermetabolic supraclavicular, mediastinal, abdominal and pelvic adenopathy. Additional smaller hypermetabolic lymph nodes identified within the left cervical lymph node chain. Largest lymph node was a 5.7 cm subcarinal lymph node (SUV 26.78).  Bone marrow aspirate and biopsy on 04/12/2018 revealed a slightly hypercellular bone marrow for age with trilineage hematopoiesis.  There were dyspoietic changes associated with the granulocyte series.  Flow cytometry revealed a predominance  of T lymphocytes with relative abundance of CD8 + cells.  There was no significant B cell population.  Cytogenetics are pending.  Echo on 04/04/2018 revealed an EF  of 55-60%.  She presented with a normocytic anemia.  She was admitted x 2 (12/2017 and 01/2018) with rectal bleeding.  She is intolerant of oral iron.  Diet is modest.  EGD on 12/30/2017 revealed non bleeding gastric erosions in the antrum.  Colonoscopy on 12/30/2017 revealed numerous polyps (one 5 mm in ascending colon, two 7 mm polyps in transverse colon, five 5-7 mm polyps in the descending colon, one 7 mm polyp in the sigmoid colon, one 8 mm polyp in the recto-sigmoid colon, and one 19 mm polyp in the rectum). 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    She developed hematochezia post colonoscopy.  Flexible sigmoidoscopy on 12/31/2017 revealed a rectal ulcer at the site of the polypectomy and blood in the rectum.  Clips were placed.  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  Work-up on 04/03/2018 revealed a hematocrit of 32.5, hemoglobin 10.7, MCV 91.7, platelets 207,000, WBC 1800 with an ANC of 600.  Uric acid was 8.8.  LDH was 241 (98-192) and beta2-microglobulin  5.8 (0.6-2.4).  Negative studies included:  hepatitis B core antibody total, hepatitis B surface antigen, hepatitis C antibody, HIV testing.     She has required 4 units of PRBCs (3 units in 12/2017 and 1 unit in 01/2018).  Ferritin has been followed: 30 on 12/29/2017, 93 on 02/13/2018, 57 on 03/07/2018, and 38 on 04/03/2018.  Labs on 12/29/2017 revealed the following:  B12 was 254 (low normal).  She has been on oral B12 since 03/07/2018.  B12 was 1524 on 04/03/2018.  Folate 6.3 (> 5.9) on 12/29/2017 and 55.8 on 04/03/2018.    She is s/p 5 cycles of mini-RCHOP (04/24/2018 - 08/10/2018) with growth factor support (Udencya then On-Pro Neulasta).  Cycle #1 platelet nadir was 82,000. Cycle #4 was postponed on 07/13/2018 (Bruno 0).   Chest, abdomen, and pelvic CT on 07/03/2018 revealed marked interval  response to therapy. Previously identified relatively bulky lymphadenopathy in the chest, abdomen, and pelvis had resolved with no pathologically enlarged lymphadenopathy.  There was no new or progressive interval findings.  She has chronic renal insufficiency.  Creatinine was 1.42 (CrCl 25 ml/min) on 04/03/2018.  She has lupus (diagnosed 1982). She is on prednisone 5 mg a day and Plaquenil.   Symptomatically, she is doing well.  She denies any B symptoms.  Exam reveals no adenopathy or hepatosplenomegaly.  Plan: 1.   Labs today: CBC with diff, CMP, Mg, LDH, uric acid. 2.   Stage IVB diffuse large B cell lymphoma Clinically doing well. Discuss plans for last cycle of chemotherapy then follow-up imaging. Cycle #6 mini-RCHOP today + On-Pro Neulasta. Discuss symptom management.  She has antiemetics and pain medications at home to use on a prn bases.  Interventions are adequate.    3.   Renal insufficiency secondary to lupus Labs reviewed. BUN 24 and creatinine 0.97. Encourage oral fluid intake. 4.   TLS monitoring No evidence of tumor lysis syndrome. Contniue allopurinol. Anticipate discontinuation of allopurinol after next visit. 5.   B12 deficiency Continues oral B12 (began 02/2018). Anticipate follow-up B12 and folate level in 12/2018 6.   Schedule PET scan in 4 weeks. 7.   RTC after PET scan for MD assessment, port flush,  labs (CBC with diff, CMP, LDH, uric acid), review of PET scan, and discussion regarding direction of therapy.   Lequita Asal, MD  08/31/2018, 4:35 PM

## 2018-09-25 ENCOUNTER — Ambulatory Visit
Admission: RE | Admit: 2018-09-25 | Discharge: 2018-09-25 | Disposition: A | Discharge status: Home / Self Care | Payer: Medicare Other | Source: Ambulatory Visit | Attending: Hematology and Oncology | Admitting: Hematology and Oncology

## 2018-09-25 ENCOUNTER — Other Ambulatory Visit: Payer: Self-pay

## 2018-09-25 ENCOUNTER — Other Ambulatory Visit: Payer: Self-pay | Admitting: Hematology and Oncology

## 2018-09-25 DIAGNOSIS — C8339 Diffuse large B-cell lymphoma, extranodal and solid organ sites: Secondary | ICD-10-CM

## 2018-09-25 LAB — GLUCOSE, CAPILLARY: Glucose-Capillary: 74 mg/dL (ref 70–99)

## 2018-09-25 MED ORDER — FLUDEOXYGLUCOSE F - 18 (FDG) INJECTION
5.9000 | Freq: Once | INTRAVENOUS | Status: AC | PRN
  Administered 2018-09-25: 6.39 via INTRAVENOUS

## 2018-09-27 ENCOUNTER — Inpatient Hospital Stay: Payer: Medicare Other | Attending: Hematology and Oncology | Admitting: Urgent Care

## 2018-09-27 ENCOUNTER — Inpatient Hospital Stay: Payer: Medicare Other

## 2018-09-27 ENCOUNTER — Other Ambulatory Visit: Payer: Self-pay

## 2018-09-27 ENCOUNTER — Telehealth: Payer: Self-pay

## 2018-09-27 VITALS — BP 121/72 | HR 81 | Temp 99.0°F | Resp 18 | Wt 115.2 lb

## 2018-09-27 DIAGNOSIS — R948 Abnormal results of function studies of other organs and systems: Secondary | ICD-10-CM

## 2018-09-27 DIAGNOSIS — Z9189 Other specified personal risk factors, not elsewhere classified: Secondary | ICD-10-CM

## 2018-09-27 DIAGNOSIS — Z85028 Personal history of other malignant neoplasm of stomach: Secondary | ICD-10-CM

## 2018-09-27 DIAGNOSIS — Z79899 Other long term (current) drug therapy: Secondary | ICD-10-CM

## 2018-09-27 DIAGNOSIS — Z87891 Personal history of nicotine dependence: Secondary | ICD-10-CM | POA: Diagnosis not present

## 2018-09-27 DIAGNOSIS — N189 Chronic kidney disease, unspecified: Secondary | ICD-10-CM | POA: Diagnosis not present

## 2018-09-27 DIAGNOSIS — C8339 Diffuse large B-cell lymphoma, extranodal and solid organ sites: Secondary | ICD-10-CM

## 2018-09-27 DIAGNOSIS — E538 Deficiency of other specified B group vitamins: Secondary | ICD-10-CM | POA: Diagnosis not present

## 2018-09-27 DIAGNOSIS — I10 Essential (primary) hypertension: Secondary | ICD-10-CM | POA: Diagnosis not present

## 2018-09-27 DIAGNOSIS — M329 Systemic lupus erythematosus, unspecified: Secondary | ICD-10-CM | POA: Insufficient documentation

## 2018-09-27 DIAGNOSIS — C8330 Diffuse large B-cell lymphoma, unspecified site: Secondary | ICD-10-CM | POA: Diagnosis present

## 2018-09-27 DIAGNOSIS — N289 Disorder of kidney and ureter, unspecified: Secondary | ICD-10-CM

## 2018-09-27 LAB — COMPREHENSIVE METABOLIC PANEL
ALT: 18 U/L (ref 0–44)
AST: 34 U/L (ref 15–41)
Albumin: 3.9 g/dL (ref 3.5–5.0)
Alkaline Phosphatase: 72 U/L (ref 38–126)
Anion gap: 9 (ref 5–15)
BUN: 17 mg/dL (ref 8–23)
CO2: 21 mmol/L — ABNORMAL LOW (ref 22–32)
Calcium: 8.6 mg/dL — ABNORMAL LOW (ref 8.9–10.3)
Chloride: 108 mmol/L (ref 98–111)
Creatinine, Ser: 0.96 mg/dL (ref 0.44–1.00)
GFR calc Af Amer: >60 mL/min (ref >60)
GFR calc non Af Amer: 60 mL/min — ABNORMAL LOW (ref >60)
Glucose, Bld: 133 mg/dL — ABNORMAL HIGH (ref 70–99)
Potassium: 3.6 mmol/L (ref 3.5–5.1)
Sodium: 138 mmol/L (ref 135–145)
Total Bilirubin: 0.4 mg/dL (ref 0.3–1.2)
Total Protein: 6.1 g/dL — ABNORMAL LOW (ref 6.5–8.1)

## 2018-09-27 LAB — CBC WITH DIFFERENTIAL/PLATELET
Abs Immature Granulocytes: 0.75 10*3/uL — ABNORMAL HIGH (ref 0.00–0.07)
Basophils Absolute: 0 10*3/uL (ref 0.0–0.1)
Basophils Relative: 0 %
Eosinophils Absolute: 0 10*3/uL (ref 0.0–0.5)
Eosinophils Relative: 0 %
HCT: 32.2 % — ABNORMAL LOW (ref 36.0–46.0)
Hemoglobin: 10.4 g/dL — ABNORMAL LOW (ref 12.0–15.0)
Immature Granulocytes: 9 %
Lymphocytes Relative: 13 %
Lymphs Abs: 1.1 10*3/uL (ref 0.7–4.0)
MCH: 33.2 pg (ref 26.0–34.0)
MCHC: 32.3 g/dL (ref 30.0–36.0)
MCV: 102.9 fL — ABNORMAL HIGH (ref 80.0–100.0)
Monocytes Absolute: 0.4 10*3/uL (ref 0.1–1.0)
Monocytes Relative: 5 %
Neutro Abs: 6.3 10*3/uL (ref 1.7–7.7)
Neutrophils Relative %: 73 %
Platelets: 134 10*3/uL — ABNORMAL LOW (ref 150–400)
RBC: 3.13 MIL/uL — ABNORMAL LOW (ref 3.87–5.11)
RDW: 15.1 % (ref 11.5–15.5)
WBC: 8.6 10*3/uL (ref 4.0–10.5)
nRBC: 0 % (ref 0.0–0.2)

## 2018-09-27 LAB — URIC ACID: Uric Acid, Serum: 5.5 mg/dL (ref 2.5–7.1)

## 2018-09-27 LAB — LACTATE DEHYDROGENASE: LDH: 285 U/L — ABNORMAL HIGH (ref 98–192)

## 2018-09-27 NOTE — Progress Notes (Signed)
Pt here for follow up. Denies any concerns at this time.  

## 2018-09-27 NOTE — Progress Notes (Signed)
[facility] day:  08/31/2018  Chief Complaint: [name] is a 73 y.o. 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  HPI: The patient was last seen in the medical oncology clinic on 08/31/2018.  At that time, patient had been feeling "very good".  Patient denied any nausea, vomiting, or changes to her bowel habits.  She was eating well.  No B symptoms or interval infections.  No neuropathy.  Exam was stable.  WBC 13,800 ([place] 10,300).  Hemoglobin 10.5, hematocrit 32.6, MCV 103.2, and platelets 162,000.  BUN 24 and creatinine 0.97 mg/dL.  Calcium 8.6 mg/dL.  LDH elevated at 235 U/L.  Uric acid normal  She received cycle #6 mini R-CHOP with OnPro Neulasta support on 08/31/2018.  Interval PET imaging performed on 09/25/2018 revealing a partial response to chemotherapy.  Bulky mediastinal adenopathy nearly resolved, however a solitary 1.3 cm nodule remained hypermetabolic (SUV 6.7).  There was a 3 mm (previously 2 cm; SUV 18) retroperitoneal lymph node adjacent to the celiac trunk with no associated metabolic activity.  In the interim, patient has been doing well.  She notes that her energy has been stable.  She denies any acute treatment-related side effects.  No nausea, vomiting, or changes to her bowel habits.  Patient voiding normally.  She denies any shortness of breath, chest pain, or palpitations.  Patient remains active and is able to complete all of her ADLs independently.  Patient advises that she maintains an adequate appetite. She is eating well. Weight today is 115 lb 3.1 oz (52.3 kg), which compared to her last visit to the clinic, represents a 1 pound increase.  Patient is making a conscious effort to maintain adequate hydration.  She is followed by her nephrologist, and husband advises that they have been told that her kidney function is "better".  Patient denies pain in the  clinic today.   Past Medical History:  Diagnosis Date  . Cancer [facility])    Stomach cancer  . Chronic kidney disease   . GERD (gastroesophageal reflux disease)   . HTN (hypertension)   . Hyperlipidemia   . Lupus ([place])   . S/P angioplasty with stent     Past Surgical History:  Procedure Laterality Date  . CARDIAC SURGERY    . CESAREAN SECTION    . COLONOSCOPY N/A 12/30/2017   Procedure: COLONOSCOPY;  Surgeon: Lin Landsman, MD;  Location: [facility] ENDOSCOPY;  Service: Gastroenterology;  Laterality: N/A;  . CORONARY ARTERY BYPASS GRAFT  2003  . ESOPHAGOGASTRODUODENOSCOPY N/A 12/30/2017   Procedure: ESOPHAGOGASTRODUODENOSCOPY (EGD);  Surgeon: Lin Landsman, MD;  Location: [facility] ENDOSCOPY;  Service: Gastroenterology;  Laterality: N/A;  . ESOPHAGOGASTRODUODENOSCOPY Left 02/05/2018   Procedure: ESOPHAGOGASTRODUODENOSCOPY (EGD);  Surgeon: Virgel Manifold, MD;  Location: [facility] ENDOSCOPY;  Service: Endoscopy;  Laterality: Left;  . ESOPHAGOGASTRODUODENOSCOPY (EGD) WITH PROPOFOL N/A 03/15/2018   Procedure: ESOPHAGOGASTRODUODENOSCOPY (EGD) WITH PROPOFOL;  Surgeon: Virgel Manifold, MD;  Location: [facility] ENDOSCOPY;  Service: Endoscopy;  Laterality: N/A;  . FLEXIBLE SIGMOIDOSCOPY N/A 12/31/2017   Procedure: FLEXIBLE SIGMOIDOSCOPY;  Surgeon: Jonathon Bellows, MD;  Location: [facility] ENDOSCOPY;  Service: Gastroenterology;  Laterality: N/A;  . PORTA CATH INSERTION N/A 04/19/2018   Procedure: PORTA CATH INSERTION;  Surgeon: Algernon Huxley, MD;  Location: [place] CV LAB;  Service: Cardiovascular;  Laterality: N/A;    Family History  Problem Relation Age of Onset  . Hypertension  Mother   . Stroke Mother   . Vascular Disease Mother   . Hypertension Sister     Social History:  reports that she quit smoking about 16 years ago. Her smoking use included cigarettes. She has a 10.00 pack-year smoking history. She has never used smokeless tobacco. She reports previous alcohol use. She reports that she  does not use drugs.  She has smoked 1/2 pack/day x 20 years.  She stopped smoking in 2000.  She denies any exposure to radiation or toxins.  She is from [place].  She moved to the [place] 52 years ago after marrying her husband.  She was in the [employer].  The patient is accompanied by her husband today.  Allergies:  Allergies  Allergen Reactions  . Penicillins Other (See Comments)    Pass out Has patient had a PCN reaction causing immediate rash, facial/tongue/throat swelling, SOB or lightheadedness with hypotension: Yes Has patient had a PCN reaction causing severe rash involving mucus membranes or skin necrosis: No Has patient had a PCN reaction that required hospitalization: No Has patient had a PCN reaction occurring within the last 10 years: No If all of the above answers are "NO", then may proceed with Cephalosporin use.   . Sulfa Antibiotics Nausea Only  . Sulfasalazine Nausea And Vomiting       . Ace Inhibitors Nausea Only  . Losartan Anxiety  . Spironolactone Other (See Comments)    Felt poorly but couldn't clarify     Current Medications: Current Outpatient Medications  Medication Sig Dispense Refill  . acetaminophen (TYLENOL) 500 MG tablet Take 1,000 mg by mouth 2 (two) times daily as needed for moderate pain or headache.    . alendronate (FOSAMAX) 70 MG tablet Take 70 mg by mouth every Saturday.     Marland Kitchen allopurinol (ZYLOPRIM) 100 MG tablet TAKE 1/2 TABLET BY MOUTH EVERY DAY 45 tablet 1  . amLODipine (NORVASC) 10 MG tablet Take 10 mg by mouth daily.    . clotrimazole-betamethasone (LOTRISONE) cream Apply 1 application topically 2 (two) times daily as needed (rash).     . ferrous sulfate 325 (65 FE) MG tablet Take 1 tablet (325 mg total) by mouth 2 (two) times daily with a meal. 60 tablet 3  . fluticasone (FLONASE) 50 MCG/ACT nasal spray Place 1 spray into both nostrils daily as needed for allergies or rhinitis.    . hydroxychloroquine (PLAQUENIL) 200 MG tablet Take 200 mg  by mouth 2 (two) times daily.     Marland Kitchen lidocaine-prilocaine (EMLA) cream Apply to affected area once 30 g 3  . Multiple Vitamin (MULTIVITAMIN WITH MINERALS) TABS tablet Take 1 tablet by mouth daily. 180 tablet 0  . ondansetron (ZOFRAN) 4 MG tablet Take 1 tablet (4 mg total) by mouth 2 (two) times daily as needed for refractory nausea / vomiting. Start on day 3 after cyclophosphamide chemotherapy. 30 tablet 3  . polyethylene glycol (MIRALAX / GLYCOLAX) packet Take 17 g by mouth daily.    . predniSONE (DELTASONE) 20 MG tablet Take 3 tablets (60 mg total) by mouth daily. Take on days 1-5 of chemotherapy. 15 tablet 5  . predniSONE (DELTASONE) 5 MG tablet Take 5 mg by mouth daily with breakfast.     . Pseudoephedrine-APAP 30-325 MG TABS Take 1 tablet by mouth daily as needed (sinuses).     . ranitidine (ZANTAC) 150 MG tablet Take 150 mg by mouth daily as needed for heartburn.     . simvastatin (ZOCOR) 40 MG  tablet Take 40 mg by mouth daily.     . traMADol (ULTRAM) 50 MG tablet Take 50 mg by mouth 2 (two) times daily as needed for moderate pain.     . feeding supplement, ENSURE ENLIVE, (ENSURE ENLIVE) LIQD Take 237 mLs by mouth 2 (two) times daily between meals. (Patient not taking: Reported on 09/27/2018) 90 Bottle 0   No current facility-administered medications for this visit.     Review of Systems  Constitutional: Negative for chills, fever, malaise/fatigue and weight loss (up 1 pound).       "I am doing good.  I feel good".  HENT: Negative.  Negative for congestion, ear discharge, ear pain, nosebleeds, sinus pain and sore throat.   Eyes: Negative.  Negative for blurred vision, double vision, photophobia and pain.  Respiratory: Negative.  Negative for cough, hemoptysis, sputum production and shortness of breath.   Cardiovascular: Positive for leg swelling (chronic BILATERAL ankles). Negative for chest pain, palpitations, orthopnea and PND.  Gastrointestinal: Negative.  Negative for abdominal pain,  blood in stool, constipation, diarrhea, heartburn, melena, nausea and vomiting.  Genitourinary: Negative.  Negative for dysuria, frequency, hematuria and urgency.       PMH (+) for lupus nephritis  Musculoskeletal: Negative.  Negative for back pain, falls, joint pain, myalgias and neck pain.  Skin: Positive for rash (malar). Negative for itching.  Neurological: Negative.  Negative for dizziness, tingling, tremors, sensory change, speech change, focal weakness, weakness and headaches.  Endo/Heme/Allergies: Negative.  Does not bruise/bleed easily.  Psychiatric/Behavioral: Negative.  Negative for depression and memory loss. The patient is not nervous/anxious and does not have insomnia.   All other systems reviewed and are negative.  Performance status (ECOG): 1 - Symptomatic but completely ambulatory  Vital Signs: BP 121/72 (BP Location: Left Arm, Patient Position: Sitting)   Pulse 81   Temp 99 F (37.2 C) (Tympanic)   Resp 18   Wt 115 lb 3.1 oz (52.3 kg)   SpO2 100%   BMI 23.27 kg/m   Physical Exam  Constitutional: She is oriented to person, place, and time and well-developed, well-nourished, and in no distress.  HENT:  Head: Normocephalic and atraumatic.  Mouth/Throat: Oropharynx is clear and moist and mucous membranes are normal. She has dentures.  Eyes: Pupils are equal, round, and reactive to light. EOM are normal. No scleral icterus.  Neck: Normal range of motion. Neck supple. No tracheal deviation present. No thyromegaly present.  Cardiovascular: Normal rate, regular rhythm, normal heart sounds and intact distal pulses. Exam reveals no gallop and no friction rub.  No murmur heard. Pulmonary/Chest: Effort normal and breath sounds normal. No respiratory distress. She has no wheezes. She has no rales.  Abdominal: Soft. Bowel sounds are normal. She exhibits no distension. There is no abdominal tenderness.  Musculoskeletal: Normal range of motion.        General: No tenderness or  edema.  Lymphadenopathy:    She has no cervical adenopathy.    She has no axillary adenopathy.       Right: No inguinal and no supraclavicular adenopathy present.       Left: No inguinal and no supraclavicular adenopathy present.  Neurological: She is alert and oriented to person, place, and time.  Skin: Skin is warm and dry. Rash (malar rash; face) noted. No erythema.  Psychiatric: Mood, affect and judgment normal.  Nursing note and vitals reviewed.   Appointment on 09/27/2018  Component Date Value Ref Range Status  . Uric Acid, Serum 09/27/2018  5.5  2.5 - 7.1 mg/dL Final   Performed at [facility], [address], [place], [place] [address]  . LDH 09/27/2018 285* 98 - 192 U/L Final   Performed at [facility], [address] [place], [place] [address]  . Sodium 09/27/2018 138  135 - 145 mmol/L Final  . Potassium 09/27/2018 3.6  3.5 - 5.1 mmol/L Final  . Chloride 09/27/2018 108  98 - 111 mmol/L Final  . CO2 09/27/2018 21* 22 - 32 mmol/L Final  . Glucose, Bld 09/27/2018 133* 70 - 99 mg/dL Final  . BUN 09/27/2018 17  8 - 23 mg/dL Final  . Creatinine, Ser 09/27/2018 0.96  0.44 - 1.00 mg/dL Final  . Calcium 09/27/2018 8.6* 8.9 - 10.3 mg/dL Final  . Total Protein 09/27/2018 6.1* 6.5 - 8.1 g/dL Final  . Albumin 09/27/2018 3.9  3.5 - 5.0 g/dL Final  . AST 09/27/2018 34  15 - 41 U/L Final  . ALT 09/27/2018 18  0 - 44 U/L Final  . Alkaline Phosphatase 09/27/2018 72  38 - 126 U/L Final  . Total Bilirubin 09/27/2018 0.4  0.3 - 1.2 mg/dL Final  . GFR calc non Af Amer 09/27/2018 60* >60 mL/min Final  . GFR calc Af Amer 09/27/2018 >60  >60 mL/min Final  . Anion gap 09/27/2018 9  5 - 15 Final   Performed at [facility], [address], [place], [place] [address]  . WBC 09/27/2018 8.6  4.0 - 10.5 K/uL Final  . RBC 09/27/2018 3.13* 3.87 - 5.11 MIL/uL Final  . Hemoglobin 09/27/2018 10.4* 12.0 - 15.0 g/dL Final  . HCT 09/27/2018 32.2* 36.0 - 46.0 % Final   . MCV 09/27/2018 102.9* 80.0 - 100.0 fL Final  . MCH 09/27/2018 33.2  26.0 - 34.0 pg Final  . MCHC 09/27/2018 32.3  30.0 - 36.0 g/dL Final  . RDW 09/27/2018 15.1  11.5 - 15.5 % Final  . Platelets 09/27/2018 134* 150 - 400 K/uL Final   Comment: Immature Platelet Fraction may be clinically indicated, consider ordering this additional test [id number]   . nRBC 09/27/2018 0.0  0.0 - 0.2 % Final  . Neutrophils Relative % 09/27/2018 73  % Final  . Neutro Abs 09/27/2018 6.3  1.7 - 7.7 K/uL Final  . Lymphocytes Relative 09/27/2018 13  % Final  . Lymphs Abs 09/27/2018 1.1  0.7 - 4.0 K/uL Final  . Monocytes Relative 09/27/2018 5  % Final  . Monocytes Absolute 09/27/2018 0.4  0.1 - 1.0 K/uL Final  . Eosinophils Relative 09/27/2018 0  % Final  . Eosinophils Absolute 09/27/2018 0.0  0.0 - 0.5 K/uL Final  . Basophils Relative 09/27/2018 0  % Final  . Basophils Absolute 09/27/2018 0.0  0.0 - 0.1 K/uL Final  . Immature Granulocytes 09/27/2018 9  % Final  . Abs Immature Granulocytes 09/27/2018 0.75* 0.00 - 0.07 K/uL Final   Performed at [facility], [address] [place], [place] [address]  Hospital Outpatient Visit on 09/25/2018  Component Date Value Ref Range Status  . Glucose-Capillary 09/25/2018 74  70 - 99 mg/dL Final    Assessment:  [name] is a 73 y.o. 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  IPI is 3 (intermediate-high risk), age-adjusted IPI is 2 (low-intermediate risk), revised IPI is 3 (poor), and NCCN-IPI is 5 (intermediate-high risk).  CNS risk is 3 (intermediate: age, LDH, stage).  EGD on 03/15/2018 revealed pathology from the duodenal scalloped lesion and duodenal sweep ulcer with involvement with diffuse large B cell lymphoma.  Random gastric biopsy was negative for lymphoma.  There was mild chronic gastritis with reactive foveolar hyperplasia.  Gastric fundus scar revealed involvement with diffuse large B  cell lymphoma.  GE junction biopsy revealed no lymphoma with squamocolumnar mucosa with mild chronic active inflammation.  The lymphoma has a non-germinal center immunophenotype with a Ki-67 proliferation index of 60% and shows Bcl-2/CMYC co-expression by  immunohistochemical analysis.   Summary of IHC results, neoplastic cells: Positive: PAX-5, CD20(variable), Bcl-2, MUM1, Bcl-6 (variable), CMYC.  Negative: CD10, CD5, CD30, CD138, Cyclin-D1, ALK-1, CD3.  EBV (ISH): Negative.  FISH for MYC gene rearrangement (8q24 gene region) was negative.  PET scan on 04/06/2018 revealed enlarged and hypermetabolic supraclavicular, mediastinal, abdominal and pelvic adenopathy. Additional smaller hypermetabolic lymph nodes identified within the left cervical lymph node chain. Largest lymph node was a 5.7 cm subcarinal lymph node (SUV 26.78).  Bone marrow aspirate and biopsy on 04/12/2018 revealed a slightly hypercellular bone marrow for age with trilineage hematopoiesis.  There were dyspoietic changes associated with the granulocyte series.  Flow cytometry revealed a predominance of T lymphocytes with relative abundance of CD8 + cells.  There was no significant B cell population.  Cytogenetics are pending.  Echo on 04/04/2018 revealed an EF of 55-60%.  She presented with a normocytic anemia.  She was admitted x 2 (12/2017 and 01/2018) with rectal bleeding.  She is intolerant of oral iron.  Diet is modest.  EGD on 12/30/2017 revealed non bleeding gastric erosions in the antrum.  Colonoscopy on 12/30/2017 revealed numerous polyps (one 5 mm in ascending colon, two 7 mm polyps in transverse colon, five 5-7 mm polyps in the descending colon, one 7 mm polyp in the sigmoid colon, one 8 mm polyp in the recto-sigmoid colon, and one 19 mm polyp in the rectum). 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    She developed hematochezia post colonoscopy.  Flexible sigmoidoscopy on 12/31/2017  revealed a rectal ulcer at the site of the polypectomy and blood in the rectum.  Clips were placed.  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  Work-up on 04/03/2018 revealed a hematocrit of 32.5, hemoglobin 10.7, MCV 91.7, platelets 207,000, WBC 1800 with an ANC of 600.  Uric acid was 8.8.  LDH was 241 (98-192) and beta2-microglobulin  5.8 (0.6-2.4).  Negative studies included:  hepatitis B core antibody total, hepatitis B surface antigen, hepatitis C antibody, HIV testing.     She has required 4 units of PRBCs (3 units in 12/2017 and 1 unit in 01/2018).  Ferritin has been followed: 30 on 12/29/2017, 93 on 02/13/2018, 57 on 03/07/2018, and 38 on 04/03/2018.  Labs on 12/29/2017 revealed the following:  B12 was 254 (low normal).  She has been on oral B12 since 03/07/2018.  B12 was 1524 on 04/03/2018.  Folate 6.3 (> 5.9) on 12/29/2017 and 55.8 on 04/03/2018.    She is s/p 6 cycles of mini-RCHOP (04/24/2018 - 08/31/2018) with growth factor support (Udencya then On-Pro Neulasta).  Cycle #1 platelet nadir was 82,000. Cycle #4 was postponed on 07/13/2018 ([place] 0).   Chest, abdomen, and pelvic CT on 07/03/2018 revealed marked interval response to therapy. Previously identified relatively bulky lymphadenopathy in the chest, abdomen, and pelvis had resolved with no pathologically enlarged lymphadenopathy.  There was  no new or progressive interval findings.  PET scan on 09/25/2018 revealing a partial response to chemotherapy.  Bulky mediastinal adenopathy nearly resolved, however a solitary 1.3 cm nodule remained hypermetabolic (SUV 6.7).  There was a 3 mm (previously 2 cm; SUV 18) retroperitoneal lymph node adjacent to the celiac trunk with no associated metabolic activity.  She has chronic renal insufficiency.  Creatinine was 1.42 (CrCl 25 ml/min) on 04/03/2018.  She has lupus (diagnosed 1982). She is on  prednisone 5 mg a day and Plaquenil.   Symptomatically, patient is doing well.  She denies any acute concerns.  Energy is stable.  No residual treatment-related side effects.  Denies nausea, vomiting, or changes to her bowel habits.  No B symptoms or recent infections.  Eating well; weight up 1 pound.  Exam is grossly unremarkable.  WBC 8600 ([place] 6300).  Hemoglobin 10.4, hematocrit 32.2, MCV 102.9, and platelets 134,000.  BUN 17 and creatinine 0.96 mg/dL. Estimated Creatinine Clearance: 40.3 mL/min (by C-G formula based on SCr of 0.96 mg/dL).  LDH 285 U/L.  Plan: 1. Labs today: CBC with diff, CMP, Mg, LDH, uric acid. 2. Stage IVB diffuse large B cell lymphoma  Clinically doing well.  Status post 6 cycles of mini R-CHOP (last 08/31/2018).  Review PET. Imaging personally reviewed and felt to be consistent with the dictated radiology report. Imaging reviewed with patient.   Bulky mediastinal adenopathy nearly resolved, however a solitary 1.3 cm nodule remained hypermetabolic (SUV 6.7).    3 mm (previously 2 cm; SUV 18) retroperitoneal lymph node adjacent to the celiac trunk with no associated metabolic activity.  Discussed partial response to treatment.   Recommend evaluation at academic center [facility]) for consideration of possible BMT.  Patient has been discussed with [place]. MD at [facility] who has agreed to see the patient in consult.   Patient agrees to referral to academic center.  Demographics and pertinent clinical documentation faxed to Dr. Christella Noa referral coordinator Raelyn Ensign)  Will discuss patient at [place] multidisciplinary tumor board on 10/05/2018. 3. 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  Renal function stable. Estimated Creatinine Clearance: 40.3 mL/min (by C-G formula based on SCr of 0.96 mg/dL).  Followed by [facility] nephrology.  Encourage to maintain adequate hydration. 4. TLS monitoring  Labs reviewed.  Potassium normal at 3.6 mmol/L.  Uric  acid normal at 5.5 mg/dL.  LDH elevated at 285 U/L.  No evidence of TLS.  Continue prophylactic 50 mg allopurinol dose daily. 5. B12 deficiency  Continue oral B12 1000 mcg supplementation (began 02/2018).  Recheck B12 and folate level and 12/2018. 6. RTC will be based on discussions from multidisciplinary tumor board and [facility] consult.  Will call patient with appointment.  Honor Loh, NP  09/27/18, 8:20 PM

## 2018-09-27 NOTE — Telephone Encounter (Signed)
Referral: Demo, Insurance, Last OV, and PET faxed to Dr. Christella Noa office.   Fax: [phone] Phone: [phone]

## 2018-09-28 ENCOUNTER — Other Ambulatory Visit: Payer: Medicare Other

## 2018-09-28 NOTE — Progress Notes (Signed)
Tumor Board Documentation  In Zahra Peffley was presented by Dr Mike Gip at our Tumor Board on 09/28/2018, which included representatives from medical oncology, radiation oncology, surgical oncology, surgical, radiology, pathology, navigation, nutrition, research.  In Sun currently presents as a current patient, for discussion with history of the following treatments: neoadjuvant chemotherapy.  Additionally, we reviewed previous medical and familial history, history of present illness, and recent lab results along with all available histopathologic and imaging studies. The tumor board considered available treatment options and made the following recommendations:   Referral to [facility] for second opinion, then possible chemotherapy and palliative Radiation therapy if progression  The following procedures/referrals were also placed: No orders of the defined types were placed in this encounter.   Clinical Trial Status: not discussed   Staging used: AJCC Stage Group  National site-specific guidelines NCCN were discussed with respect to the case.  Tumor board is a meeting of clinicians from various specialty areas who evaluate and discuss patients for whom a multidisciplinary approach is being considered. Final determinations in the plan of care are those of the provider(s). The responsibility for follow up of recommendations given during tumor board is that of the provider.   Today's extended care, comprehensive team conference, In Sun was not present for the discussion and was not examined.   Multidisciplinary Tumor Board is a multidisciplinary case peer review process.  Decisions discussed in the Multidisciplinary Tumor Board reflect the opinions of the specialists present at the conference without having examined the patient.  Ultimately, treatment and diagnostic decisions rest with the primary provider(s) and the patient.

## 2018-10-17 ENCOUNTER — Other Ambulatory Visit: Payer: Self-pay

## 2018-10-18 ENCOUNTER — Ambulatory Visit
Admission: RE | Admit: 2018-10-18 | Discharge: 2018-10-18 | Disposition: A | Discharge status: Home / Self Care | Payer: Medicare Other | Source: Ambulatory Visit | Attending: Radiation Oncology | Admitting: Radiation Oncology

## 2018-10-18 ENCOUNTER — Other Ambulatory Visit: Payer: Self-pay

## 2018-10-18 ENCOUNTER — Encounter: Payer: Self-pay | Admitting: Radiation Oncology

## 2018-10-18 DIAGNOSIS — I129 Hypertensive chronic kidney disease with stage 1 through stage 4 chronic kidney disease, or unspecified chronic kidney disease: Secondary | ICD-10-CM | POA: Insufficient documentation

## 2018-10-18 DIAGNOSIS — Z79899 Other long term (current) drug therapy: Secondary | ICD-10-CM | POA: Diagnosis not present

## 2018-10-18 DIAGNOSIS — N189 Chronic kidney disease, unspecified: Secondary | ICD-10-CM | POA: Insufficient documentation

## 2018-10-18 DIAGNOSIS — C8339 Diffuse large B-cell lymphoma, extranodal and solid organ sites: Secondary | ICD-10-CM | POA: Diagnosis present

## 2018-10-18 DIAGNOSIS — Z87891 Personal history of nicotine dependence: Secondary | ICD-10-CM | POA: Insufficient documentation

## 2018-10-18 DIAGNOSIS — K219 Gastro-esophageal reflux disease without esophagitis: Secondary | ICD-10-CM | POA: Insufficient documentation

## 2018-10-18 NOTE — Consult Note (Signed)
NEW PATIENT EVALUATION  Name: Judy Torres  MRN: [id number]  Date:   10/18/2018     DOB: Dec 12, 1945   This 73 y.o. 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 Cancer    Initial consultation    DIAGNOSIS: The encounter diagnosis was 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 ([place]).   PREVIOUS INVESTIGATIONS:  PET/CT scans reviewed Judy serial fashion Pathology report reviewed Clinical notes reviewed  HPI: Patient is a 73 year old female who presented with diffuse B-cell lymphoma involving the gastric fundus and duodenum.  Initial PET CT scan performed September 2019 showed hypermetabolic nodes involving mediastinum abdominal and pelvic adenopathy as well as left cervical lymph node chain and supraclavicular fossa.  Her original presentation was of normocytic anemia and rectal bleeding.  Initial upper endoscopy showed gastric erosion Judy the antrum.  She is status post mini R-CHOP with excellent response only some persistent disease Judy the mediastinum with a hypermetabolic node.  She was seen for second opinion at [facility].  Again there was a 1.3 cm lymph node within the mediastinum still hypermetabolic consistent with a Duvall 4 response.  Based on the excellent response and persistent disease Judy mediastinum I have recommended involved field radiation therapy and is now referred back to [facility] for consideration of treatment.  Patient is doing fairly well specifically denies fever chills or night sweats.  She is eating well weight is stable.  [facility] is also recommended a follow-up PET CT scan Judy 3 months after completion of radiation.  PLANNED TREATMENT REGIMEN: Involved field radiation  therapy  PAST MEDICAL HISTORY:  has a past medical history of Cancer ([place]), Chronic kidney disease, GERD (gastroesophageal reflux disease), HTN (hypertension), Hyperlipidemia, Lupus ([place]), and S/P angioplasty with stent.    PAST SURGICAL HISTORY:  Past Surgical History:  Procedure Laterality Date  . CARDIAC SURGERY    . CESAREAN SECTION    . COLONOSCOPY N/A 12/30/2017   Procedure: COLONOSCOPY;  Surgeon: Lin Landsman, MD;  Location: [facility] ENDOSCOPY;  Service: Gastroenterology;  Laterality: N/A;  . CORONARY ARTERY BYPASS GRAFT  2003  . ESOPHAGOGASTRODUODENOSCOPY N/A 12/30/2017   Procedure: ESOPHAGOGASTRODUODENOSCOPY (EGD);  Surgeon: Lin Landsman, MD;  Location: [facility] ENDOSCOPY;  Service: Gastroenterology;  Laterality: N/A;  . ESOPHAGOGASTRODUODENOSCOPY Left 02/05/2018   Procedure: ESOPHAGOGASTRODUODENOSCOPY (EGD);  Surgeon: Virgel Manifold, MD;  Location: [facility] ENDOSCOPY;  Service: Endoscopy;  Laterality: Left;  . ESOPHAGOGASTRODUODENOSCOPY (EGD) WITH PROPOFOL N/A 03/15/2018   Procedure: ESOPHAGOGASTRODUODENOSCOPY (EGD) WITH PROPOFOL;  Surgeon: Virgel Manifold, MD;  Location: [facility] ENDOSCOPY;  Service: Endoscopy;  Laterality: N/A;  . FLEXIBLE SIGMOIDOSCOPY N/A 12/31/2017   Procedure: FLEXIBLE SIGMOIDOSCOPY;  Surgeon: Jonathon Bellows, MD;  Location: [facility] ENDOSCOPY;  Service: Gastroenterology;  Laterality: N/A;  . PORTA CATH INSERTION N/A 04/19/2018   Procedure: PORTA CATH INSERTION;  Surgeon: Algernon Huxley, MD;  Location: [place] CV LAB;  Service: Cardiovascular;  Laterality: N/A;    FAMILY HISTORY: family history includes Hypertension Judy her mother and sister; Stroke Judy her mother; Vascular Disease Judy her mother.  SOCIAL HISTORY:  reports that she quit smoking about 16 years ago. Her smoking use included cigarettes. She has a 10.00 pack-year smoking history. She has never used smokeless tobacco. She reports  previous alcohol use. She reports that she does not use  drugs.  ALLERGIES: Penicillins; Sulfa antibiotics; Sulfasalazine; Ace inhibitors; Losartan; and Spironolactone  MEDICATIONS:  Current Outpatient Medications  Medication Sig Dispense Refill  . acetaminophen (TYLENOL) 500 MG tablet Take 1,000 mg by mouth 2 (two) times daily as needed for moderate pain or headache.    . alendronate (FOSAMAX) 70 MG tablet Take 70 mg by mouth every Saturday.     Marland Kitchen allopurinol (ZYLOPRIM) 100 MG tablet TAKE 1/2 TABLET BY MOUTH EVERY DAY 45 tablet 1  . amLODipine (NORVASC) 10 MG tablet Take 10 mg by mouth daily.    . clotrimazole-betamethasone (LOTRISONE) cream Apply 1 application topically 2 (two) times daily as needed (rash).     . ferrous sulfate 325 (65 FE) MG tablet Take 1 tablet (325 mg total) by mouth 2 (two) times daily with a meal. 60 tablet 3  . fluticasone (FLONASE) 50 MCG/ACT nasal spray Place 1 spray into both nostrils daily as needed for allergies or rhinitis.    . hydroxychloroquine (PLAQUENIL) 200 MG tablet Take 200 mg by mouth 2 (two) times daily.     Marland Kitchen lidocaine-prilocaine (EMLA) cream Apply to affected area once 30 g 3  . Multiple Vitamin (MULTIVITAMIN WITH MINERALS) TABS tablet Take 1 tablet by mouth daily. 180 tablet 0  . ondansetron (ZOFRAN) 4 MG tablet Take 1 tablet (4 mg total) by mouth 2 (two) times daily as needed for refractory nausea / vomiting. Start on day 3 after cyclophosphamide chemotherapy. 30 tablet 3  . polyethylene glycol (MIRALAX / GLYCOLAX) packet Take 17 g by mouth daily.    . predniSONE (DELTASONE) 20 MG tablet Take 3 tablets (60 mg total) by mouth daily. Take on days 1-5 of chemotherapy. 15 tablet 5  . predniSONE (DELTASONE) 5 MG tablet Take 5 mg by mouth daily with breakfast.     . Pseudoephedrine-APAP 30-325 MG TABS Take 1 tablet by mouth daily as needed (sinuses).     . ranitidine (ZANTAC) 150 MG tablet Take 150 mg by mouth daily as needed for heartburn.     . simvastatin (ZOCOR) 40 MG tablet Take 40 mg by mouth daily.      . traMADol (ULTRAM) 50 MG tablet Take 50 mg by mouth 2 (two) times daily as needed for moderate pain.     . feeding supplement, ENSURE ENLIVE, (ENSURE ENLIVE) LIQD Take 237 mLs by mouth 2 (two) times daily between meals. (Patient not taking: Reported on 10/18/2018) 90 Bottle 0   No current facility-administered medications for this encounter.     ECOG PERFORMANCE STATUS:  0 - Asymptomatic  REVIEW OF SYSTEMS: Patient denies any weight loss, fatigue, weakness, fever, chills or night sweats. Patient denies any loss of vision, blurred vision. Patient denies any ringing  of the ears or hearing loss. No irregular heartbeat. Patient denies heart murmur or history of fainting. Patient denies any chest pain or pain radiating to her upper extremities. Patient denies any shortness of breath, difficulty breathing at night, cough or hemoptysis. Patient denies any swelling Judy the lower legs. Patient denies any nausea vomiting, vomiting of blood, or coffee ground material Judy the vomitus. Patient denies any stomach pain. Patient states has had normal bowel movements no significant constipation or diarrhea. Patient denies any dysuria, hematuria or significant nocturia. Patient denies any problems walking, swelling Judy the joints or loss of balance. Patient denies any skin changes, loss of hair or loss of weight. Patient denies any excessive worrying or  anxiety or significant depression. Patient denies any problems with insomnia. Patient denies excessive thirst, polyuria, polydipsia. Patient denies any swollen glands, patient denies easy bruising or easy bleeding. Patient denies any recent infections, allergies or URI. Patient "s visual fields have not changed significantly Judy recent time.   PHYSICAL EXAM: BP (P) 137/85 (BP Location: Left Arm, Patient Position: Sitting)   Pulse (P) 97   Temp (P) 98.7 F (37.1 C) (Tympanic)   Resp (P) 16   Wt (P) 118 lb 11.5 oz (53.8 kg)   BMI (P) 23.98 kg/m  Well-developed  well-nourished patient Judy NAD. HEENT reveals PERLA, EOMI, discs not visualized.  Oral cavity is clear. No oral mucosal lesions are identified. Neck is clear without evidence of cervical or supraclavicular adenopathy. Lungs are clear to A&P. Cardiac examination is essentially unremarkable with regular rate and rhythm without murmur rub or thrill. Abdomen is benign with no organomegaly or masses noted. Motor sensory and DTR levels are equal and symmetric Judy the upper and lower extremities. Cranial nerves II through XII are grossly intact. Proprioception is intact. No peripheral adenopathy or edema is identified. No motor or sensory levels are noted. Crude visual fields are within normal range.  LABORATORY DATA: Pathology report reviewed    RADIOLOGY RESULTS: PET CT scans reviewed Judy a serial fashion.   IMPRESSION: Stage IVb diffuse large B-cell lymphoma Judy 73 year old female status post 6 cycles of mini R-CHOP with excellent response except first persistent hypermetabolic disease Judy her mediastinum.  PLAN: At this time like to go ahead with involved field radiation therapy.  I will treat her mediastinum to 3000 cGy using PET/CT fusion study of original PET CT scan to target areas of lymph node involvement.  I would treat to a slightly higher dose her hyperbolic lymph node using IM RT dose painting technique and bring that to approximately 3600 centigrade.  Risks and benefits of treatment occluding possible radiation esophagitis causing dysphagia fatigue alteration of blood count skin reaction or were discussed Judy detail with the patient.  She seems to comprehend my treatment plan well.  I have personally set up and ordered CT simulation for later this week..  Patient comprehends my treatment plan well.  I would like to take this opportunity to thank you for allowing me to participate Judy the care of your patient.Noreene Filbert, MD

## 2018-10-20 ENCOUNTER — Other Ambulatory Visit: Payer: Self-pay

## 2018-10-23 ENCOUNTER — Other Ambulatory Visit: Payer: Self-pay

## 2018-10-23 ENCOUNTER — Ambulatory Visit
Admission: RE | Admit: 2018-10-23 | Discharge: 2018-10-23 | Disposition: A | Discharge status: Home / Self Care | Payer: Medicare Other | Source: Ambulatory Visit | Attending: Radiation Oncology | Admitting: Radiation Oncology

## 2018-10-23 DIAGNOSIS — I129 Hypertensive chronic kidney disease with stage 1 through stage 4 chronic kidney disease, or unspecified chronic kidney disease: Secondary | ICD-10-CM | POA: Diagnosis not present

## 2018-10-23 DIAGNOSIS — Z7983 Long term (current) use of bisphosphonates: Secondary | ICD-10-CM | POA: Diagnosis not present

## 2018-10-23 DIAGNOSIS — Z87891 Personal history of nicotine dependence: Secondary | ICD-10-CM | POA: Diagnosis not present

## 2018-10-23 DIAGNOSIS — Z510 Encounter for antineoplastic radiation therapy: Secondary | ICD-10-CM | POA: Insufficient documentation

## 2018-10-23 DIAGNOSIS — E785 Hyperlipidemia, unspecified: Secondary | ICD-10-CM | POA: Insufficient documentation

## 2018-10-23 DIAGNOSIS — N189 Chronic kidney disease, unspecified: Secondary | ICD-10-CM | POA: Insufficient documentation

## 2018-10-23 DIAGNOSIS — Z951 Presence of aortocoronary bypass graft: Secondary | ICD-10-CM | POA: Diagnosis not present

## 2018-10-23 DIAGNOSIS — Z79899 Other long term (current) drug therapy: Secondary | ICD-10-CM | POA: Diagnosis not present

## 2018-10-23 DIAGNOSIS — K219 Gastro-esophageal reflux disease without esophagitis: Secondary | ICD-10-CM | POA: Diagnosis not present

## 2018-10-23 DIAGNOSIS — C8330 Diffuse large B-cell lymphoma, unspecified site: Secondary | ICD-10-CM | POA: Diagnosis not present

## 2018-10-23 DIAGNOSIS — Z7952 Long term (current) use of systemic steroids: Secondary | ICD-10-CM | POA: Insufficient documentation

## 2018-10-27 ENCOUNTER — Other Ambulatory Visit: Payer: Self-pay | Admitting: *Deleted

## 2018-10-27 DIAGNOSIS — C8339 Diffuse large B-cell lymphoma, extranodal and solid organ sites: Secondary | ICD-10-CM

## 2018-10-30 DIAGNOSIS — Z510 Encounter for antineoplastic radiation therapy: Secondary | ICD-10-CM | POA: Diagnosis not present

## 2018-10-31 ENCOUNTER — Other Ambulatory Visit: Payer: Self-pay

## 2018-11-01 ENCOUNTER — Ambulatory Visit
Admission: RE | Admit: 2018-11-01 | Discharge: 2018-11-01 | Disposition: A | Discharge status: Home / Self Care | Payer: Medicare Other | Source: Ambulatory Visit | Attending: Radiation Oncology | Admitting: Radiation Oncology

## 2018-11-01 ENCOUNTER — Other Ambulatory Visit: Payer: Self-pay

## 2018-11-02 ENCOUNTER — Ambulatory Visit
Admission: RE | Admit: 2018-11-02 | Discharge: 2018-11-02 | Disposition: A | Discharge status: Home / Self Care | Payer: Medicare Other | Source: Ambulatory Visit | Attending: Radiation Oncology | Admitting: Radiation Oncology

## 2018-11-02 ENCOUNTER — Other Ambulatory Visit: Payer: Self-pay

## 2018-11-02 DIAGNOSIS — Z510 Encounter for antineoplastic radiation therapy: Secondary | ICD-10-CM | POA: Diagnosis not present

## 2018-11-03 ENCOUNTER — Ambulatory Visit
Admission: RE | Admit: 2018-11-03 | Discharge: 2018-11-03 | Disposition: A | Discharge status: Home / Self Care | Payer: Medicare Other | Source: Ambulatory Visit | Attending: Radiation Oncology | Admitting: Radiation Oncology

## 2018-11-03 ENCOUNTER — Other Ambulatory Visit: Payer: Self-pay

## 2018-11-03 DIAGNOSIS — Z510 Encounter for antineoplastic radiation therapy: Secondary | ICD-10-CM | POA: Diagnosis not present

## 2018-11-06 ENCOUNTER — Ambulatory Visit
Admission: RE | Admit: 2018-11-06 | Discharge: 2018-11-06 | Disposition: A | Discharge status: Home / Self Care | Payer: Medicare Other | Source: Ambulatory Visit | Attending: Radiation Oncology | Admitting: Radiation Oncology

## 2018-11-06 ENCOUNTER — Other Ambulatory Visit: Payer: Self-pay

## 2018-11-06 DIAGNOSIS — Z510 Encounter for antineoplastic radiation therapy: Secondary | ICD-10-CM | POA: Diagnosis not present

## 2018-11-07 ENCOUNTER — Other Ambulatory Visit: Payer: Self-pay

## 2018-11-07 ENCOUNTER — Ambulatory Visit
Admission: RE | Admit: 2018-11-07 | Discharge: 2018-11-07 | Disposition: A | Discharge status: Home / Self Care | Payer: Medicare Other | Source: Ambulatory Visit | Attending: Radiation Oncology | Admitting: Radiation Oncology

## 2018-11-07 DIAGNOSIS — Z510 Encounter for antineoplastic radiation therapy: Secondary | ICD-10-CM | POA: Diagnosis not present

## 2018-11-08 ENCOUNTER — Other Ambulatory Visit: Payer: Self-pay

## 2018-11-08 ENCOUNTER — Ambulatory Visit
Admission: RE | Admit: 2018-11-08 | Discharge: 2018-11-08 | Disposition: A | Discharge status: Home / Self Care | Payer: Medicare Other | Source: Ambulatory Visit | Attending: Radiation Oncology | Admitting: Radiation Oncology

## 2018-11-08 DIAGNOSIS — Z510 Encounter for antineoplastic radiation therapy: Secondary | ICD-10-CM | POA: Diagnosis not present

## 2018-11-09 ENCOUNTER — Ambulatory Visit
Admission: RE | Admit: 2018-11-09 | Discharge: 2018-11-09 | Disposition: A | Discharge status: Home / Self Care | Payer: Medicare Other | Source: Ambulatory Visit | Attending: Radiation Oncology | Admitting: Radiation Oncology

## 2018-11-09 ENCOUNTER — Other Ambulatory Visit: Payer: Self-pay

## 2018-11-09 DIAGNOSIS — Z510 Encounter for antineoplastic radiation therapy: Secondary | ICD-10-CM | POA: Diagnosis not present

## 2018-11-10 ENCOUNTER — Other Ambulatory Visit: Payer: Self-pay

## 2018-11-10 ENCOUNTER — Ambulatory Visit
Admission: RE | Admit: 2018-11-10 | Discharge: 2018-11-10 | Disposition: A | Discharge status: Home / Self Care | Payer: Medicare Other | Source: Ambulatory Visit | Attending: Radiation Oncology | Admitting: Radiation Oncology

## 2018-11-10 DIAGNOSIS — Z510 Encounter for antineoplastic radiation therapy: Secondary | ICD-10-CM | POA: Diagnosis not present

## 2018-11-13 ENCOUNTER — Inpatient Hospital Stay: Payer: Medicare Other | Attending: Radiation Oncology

## 2018-11-13 ENCOUNTER — Ambulatory Visit
Admission: RE | Admit: 2018-11-13 | Discharge: 2018-11-13 | Disposition: A | Discharge status: Home / Self Care | Payer: Medicare Other | Source: Ambulatory Visit | Attending: Radiation Oncology | Admitting: Radiation Oncology

## 2018-11-13 ENCOUNTER — Other Ambulatory Visit: Payer: Self-pay

## 2018-11-13 ENCOUNTER — Other Ambulatory Visit: Payer: Self-pay | Admitting: *Deleted

## 2018-11-13 DIAGNOSIS — Z510 Encounter for antineoplastic radiation therapy: Secondary | ICD-10-CM | POA: Diagnosis not present

## 2018-11-13 MED ORDER — SUCRALFATE 1 G PO TABS: 1.0000 g | ORAL_TABLET | Freq: Three times a day (TID) | ORAL | 3 refills | Status: DC

## 2018-11-14 ENCOUNTER — Ambulatory Visit
Admission: RE | Admit: 2018-11-14 | Discharge: 2018-11-14 | Disposition: A | Discharge status: Home / Self Care | Payer: Medicare Other | Source: Ambulatory Visit | Attending: Radiation Oncology | Admitting: Radiation Oncology

## 2018-11-14 ENCOUNTER — Other Ambulatory Visit: Payer: Self-pay

## 2018-11-14 DIAGNOSIS — Z510 Encounter for antineoplastic radiation therapy: Secondary | ICD-10-CM | POA: Diagnosis not present

## 2018-11-15 ENCOUNTER — Ambulatory Visit: Payer: Medicare Other

## 2018-11-16 ENCOUNTER — Ambulatory Visit: Payer: Medicare Other

## 2018-11-17 ENCOUNTER — Encounter: Payer: Self-pay | Admitting: Anatomic Pathology & Clinical Pathology

## 2018-11-17 ENCOUNTER — Ambulatory Visit: Payer: Medicare Other

## 2018-11-20 ENCOUNTER — Other Ambulatory Visit: Payer: Self-pay

## 2018-11-20 ENCOUNTER — Inpatient Hospital Stay: Payer: Medicare Other | Attending: Radiation Oncology

## 2018-11-20 ENCOUNTER — Ambulatory Visit
Admission: RE | Admit: 2018-11-20 | Discharge: 2018-11-20 | Disposition: A | Discharge status: Home / Self Care | Payer: Medicare Other | Source: Ambulatory Visit | Attending: Radiation Oncology | Admitting: Radiation Oncology

## 2018-11-20 DIAGNOSIS — Z951 Presence of aortocoronary bypass graft: Secondary | ICD-10-CM | POA: Insufficient documentation

## 2018-11-20 DIAGNOSIS — Z7952 Long term (current) use of systemic steroids: Secondary | ICD-10-CM | POA: Insufficient documentation

## 2018-11-20 DIAGNOSIS — N189 Chronic kidney disease, unspecified: Secondary | ICD-10-CM | POA: Insufficient documentation

## 2018-11-20 DIAGNOSIS — Z79899 Other long term (current) drug therapy: Secondary | ICD-10-CM | POA: Diagnosis not present

## 2018-11-20 DIAGNOSIS — E785 Hyperlipidemia, unspecified: Secondary | ICD-10-CM | POA: Insufficient documentation

## 2018-11-20 DIAGNOSIS — I129 Hypertensive chronic kidney disease with stage 1 through stage 4 chronic kidney disease, or unspecified chronic kidney disease: Secondary | ICD-10-CM | POA: Diagnosis not present

## 2018-11-20 DIAGNOSIS — Z510 Encounter for antineoplastic radiation therapy: Secondary | ICD-10-CM | POA: Diagnosis not present

## 2018-11-20 DIAGNOSIS — C8330 Diffuse large B-cell lymphoma, unspecified site: Secondary | ICD-10-CM | POA: Insufficient documentation

## 2018-11-20 DIAGNOSIS — Z7983 Long term (current) use of bisphosphonates: Secondary | ICD-10-CM | POA: Insufficient documentation

## 2018-11-20 DIAGNOSIS — Z87891 Personal history of nicotine dependence: Secondary | ICD-10-CM | POA: Insufficient documentation

## 2018-11-20 DIAGNOSIS — C8339 Diffuse large B-cell lymphoma, extranodal and solid organ sites: Secondary | ICD-10-CM

## 2018-11-20 DIAGNOSIS — K219 Gastro-esophageal reflux disease without esophagitis: Secondary | ICD-10-CM | POA: Insufficient documentation

## 2018-11-20 LAB — CBC
HCT: 35.8 % — ABNORMAL LOW (ref 36.0–46.0)
Hemoglobin: 11.6 g/dL — ABNORMAL LOW (ref 12.0–15.0)
MCH: 33.1 pg (ref 26.0–34.0)
MCHC: 32.4 g/dL (ref 30.0–36.0)
MCV: 102.3 fL — ABNORMAL HIGH (ref 80.0–100.0)
Platelets: 109 10*3/uL — ABNORMAL LOW (ref 150–400)
RBC: 3.5 MIL/uL — ABNORMAL LOW (ref 3.87–5.11)
RDW: 12.6 % (ref 11.5–15.5)
WBC: 7.6 10*3/uL (ref 4.0–10.5)
nRBC: 0 % (ref 0.0–0.2)

## 2018-11-21 ENCOUNTER — Other Ambulatory Visit: Payer: Self-pay | Admitting: *Deleted

## 2018-11-21 ENCOUNTER — Other Ambulatory Visit: Payer: Self-pay

## 2018-11-21 ENCOUNTER — Ambulatory Visit
Admission: RE | Admit: 2018-11-21 | Discharge: 2018-11-21 | Disposition: A | Discharge status: Home / Self Care | Payer: Medicare Other | Source: Ambulatory Visit | Attending: Radiation Oncology | Admitting: Radiation Oncology

## 2018-11-21 DIAGNOSIS — Z510 Encounter for antineoplastic radiation therapy: Secondary | ICD-10-CM | POA: Diagnosis not present

## 2018-11-21 MED ORDER — DEXAMETHASONE 4 MG PO TABS: 4.0000 mg | ORAL_TABLET | Freq: Every day | ORAL | 0 refills | Status: DC

## 2018-11-22 ENCOUNTER — Ambulatory Visit
Admission: RE | Admit: 2018-11-22 | Discharge: 2018-11-22 | Disposition: A | Discharge status: Home / Self Care | Payer: Medicare Other | Source: Ambulatory Visit | Attending: Radiation Oncology | Admitting: Radiation Oncology

## 2018-11-22 ENCOUNTER — Other Ambulatory Visit: Payer: Self-pay

## 2018-11-22 DIAGNOSIS — Z510 Encounter for antineoplastic radiation therapy: Secondary | ICD-10-CM | POA: Diagnosis not present

## 2018-11-23 ENCOUNTER — Other Ambulatory Visit: Payer: Self-pay

## 2018-11-23 ENCOUNTER — Ambulatory Visit
Admission: RE | Admit: 2018-11-23 | Discharge: 2018-11-23 | Disposition: A | Discharge status: Home / Self Care | Payer: Medicare Other | Source: Ambulatory Visit | Attending: Radiation Oncology | Admitting: Radiation Oncology

## 2018-11-23 DIAGNOSIS — Z510 Encounter for antineoplastic radiation therapy: Secondary | ICD-10-CM | POA: Diagnosis not present

## 2018-11-24 ENCOUNTER — Other Ambulatory Visit: Payer: Self-pay

## 2018-11-24 ENCOUNTER — Ambulatory Visit
Admission: RE | Admit: 2018-11-24 | Discharge: 2018-11-24 | Disposition: A | Discharge status: Home / Self Care | Payer: Medicare Other | Source: Ambulatory Visit | Attending: Radiation Oncology | Admitting: Radiation Oncology

## 2018-11-24 DIAGNOSIS — Z510 Encounter for antineoplastic radiation therapy: Secondary | ICD-10-CM | POA: Diagnosis not present

## 2018-11-27 ENCOUNTER — Inpatient Hospital Stay: Payer: Medicare Other

## 2018-11-27 ENCOUNTER — Other Ambulatory Visit: Payer: Self-pay

## 2018-11-27 ENCOUNTER — Ambulatory Visit
Admission: RE | Admit: 2018-11-27 | Discharge: 2018-11-27 | Disposition: A | Discharge status: Home / Self Care | Payer: Medicare Other | Source: Ambulatory Visit | Attending: Radiation Oncology | Admitting: Radiation Oncology

## 2018-11-27 DIAGNOSIS — Z510 Encounter for antineoplastic radiation therapy: Secondary | ICD-10-CM | POA: Diagnosis not present

## 2018-11-28 ENCOUNTER — Ambulatory Visit
Admission: RE | Admit: 2018-11-28 | Discharge: 2018-11-28 | Disposition: A | Discharge status: Home / Self Care | Payer: Medicare Other | Source: Ambulatory Visit | Attending: Radiation Oncology | Admitting: Radiation Oncology

## 2018-11-28 ENCOUNTER — Other Ambulatory Visit: Payer: Self-pay

## 2018-11-28 DIAGNOSIS — Z510 Encounter for antineoplastic radiation therapy: Secondary | ICD-10-CM | POA: Diagnosis not present

## 2018-11-29 ENCOUNTER — Ambulatory Visit
Admission: RE | Admit: 2018-11-29 | Discharge: 2018-11-29 | Disposition: A | Discharge status: Home / Self Care | Payer: Medicare Other | Source: Ambulatory Visit | Attending: Radiation Oncology | Admitting: Radiation Oncology

## 2018-11-29 ENCOUNTER — Other Ambulatory Visit: Payer: Self-pay

## 2018-11-29 ENCOUNTER — Ambulatory Visit: Payer: Medicare Other

## 2018-11-29 DIAGNOSIS — Z510 Encounter for antineoplastic radiation therapy: Secondary | ICD-10-CM | POA: Diagnosis not present

## 2018-11-30 ENCOUNTER — Other Ambulatory Visit: Payer: Self-pay

## 2018-11-30 ENCOUNTER — Ambulatory Visit
Admission: RE | Admit: 2018-11-30 | Discharge: 2018-11-30 | Disposition: A | Discharge status: Home / Self Care | Payer: Medicare Other | Source: Ambulatory Visit | Attending: Radiation Oncology | Admitting: Radiation Oncology

## 2018-11-30 DIAGNOSIS — Z510 Encounter for antineoplastic radiation therapy: Secondary | ICD-10-CM | POA: Diagnosis not present

## 2018-12-01 ENCOUNTER — Ambulatory Visit
Admission: RE | Admit: 2018-12-01 | Discharge: 2018-12-01 | Disposition: A | Discharge status: Home / Self Care | Payer: Medicare Other | Source: Ambulatory Visit | Attending: Radiation Oncology | Admitting: Radiation Oncology

## 2018-12-01 ENCOUNTER — Other Ambulatory Visit: Payer: Self-pay

## 2018-12-01 DIAGNOSIS — Z510 Encounter for antineoplastic radiation therapy: Secondary | ICD-10-CM | POA: Diagnosis not present

## 2018-12-04 ENCOUNTER — Other Ambulatory Visit: Payer: Self-pay

## 2018-12-04 ENCOUNTER — Ambulatory Visit
Admission: RE | Admit: 2018-12-04 | Discharge: 2018-12-04 | Disposition: A | Discharge status: Home / Self Care | Payer: Medicare Other | Source: Ambulatory Visit | Attending: Radiation Oncology | Admitting: Radiation Oncology

## 2018-12-04 DIAGNOSIS — Z510 Encounter for antineoplastic radiation therapy: Secondary | ICD-10-CM | POA: Diagnosis not present

## 2018-12-20 ENCOUNTER — Telehealth: Payer: Self-pay

## 2018-12-20 ENCOUNTER — Inpatient Hospital Stay: Payer: Medicare Other | Attending: Radiation Oncology

## 2018-12-20 DIAGNOSIS — C8339 Diffuse large B-cell lymphoma, extranodal and solid organ sites: Secondary | ICD-10-CM | POA: Insufficient documentation

## 2018-12-20 DIAGNOSIS — M329 Systemic lupus erythematosus, unspecified: Secondary | ICD-10-CM | POA: Insufficient documentation

## 2018-12-20 DIAGNOSIS — Z85028 Personal history of other malignant neoplasm of stomach: Secondary | ICD-10-CM | POA: Insufficient documentation

## 2018-12-20 DIAGNOSIS — E860 Dehydration: Secondary | ICD-10-CM | POA: Insufficient documentation

## 2018-12-20 DIAGNOSIS — Z7952 Long term (current) use of systemic steroids: Secondary | ICD-10-CM | POA: Insufficient documentation

## 2018-12-20 DIAGNOSIS — R5381 Other malaise: Secondary | ICD-10-CM | POA: Insufficient documentation

## 2018-12-20 DIAGNOSIS — N189 Chronic kidney disease, unspecified: Secondary | ICD-10-CM | POA: Insufficient documentation

## 2018-12-20 DIAGNOSIS — I10 Essential (primary) hypertension: Secondary | ICD-10-CM | POA: Insufficient documentation

## 2018-12-20 DIAGNOSIS — Z7951 Long term (current) use of inhaled steroids: Secondary | ICD-10-CM | POA: Insufficient documentation

## 2018-12-20 DIAGNOSIS — Z87891 Personal history of nicotine dependence: Secondary | ICD-10-CM | POA: Insufficient documentation

## 2018-12-20 DIAGNOSIS — Z923 Personal history of irradiation: Secondary | ICD-10-CM | POA: Insufficient documentation

## 2018-12-20 DIAGNOSIS — Z79899 Other long term (current) drug therapy: Secondary | ICD-10-CM | POA: Insufficient documentation

## 2018-12-20 DIAGNOSIS — R0602 Shortness of breath: Secondary | ICD-10-CM | POA: Insufficient documentation

## 2018-12-20 NOTE — Telephone Encounter (Signed)
Spoke with husband, Marcello Moores, and informed him d/t high temp. Patient needs to be evaluated today by either PCP or [facility]. Husband states PCP has ben contacted and they are awaiting callback. Husband reports patient's temperature is now 99.2 without taking medication. Advised that patient still needs to be evaluated and to ensure that she is seen. Husband verbalizes understanding and denies any further questions.

## 2019-01-08 ENCOUNTER — Other Ambulatory Visit: Payer: Self-pay

## 2019-01-08 ENCOUNTER — Inpatient Hospital Stay: Payer: Medicare Other

## 2019-01-08 ENCOUNTER — Other Ambulatory Visit: Payer: Self-pay | Admitting: Oncology

## 2019-01-08 ENCOUNTER — Ambulatory Visit
Admission: RE | Admit: 2019-01-08 | Discharge: 2019-01-08 | Disposition: A | Discharge status: Home / Self Care | Payer: Medicare Other | Source: Ambulatory Visit | Attending: Oncology | Admitting: Oncology

## 2019-01-08 ENCOUNTER — Encounter: Payer: Self-pay | Admitting: Radiation Oncology

## 2019-01-08 ENCOUNTER — Ambulatory Visit
Admission: RE | Admit: 2019-01-08 | Discharge: 2019-01-08 | Disposition: A | Discharge status: Home / Self Care | Payer: Medicare Other | Source: Ambulatory Visit | Attending: Radiation Oncology | Admitting: Radiation Oncology

## 2019-01-08 ENCOUNTER — Other Ambulatory Visit: Payer: Self-pay | Admitting: *Deleted

## 2019-01-08 ENCOUNTER — Inpatient Hospital Stay (HOSPITAL_BASED_OUTPATIENT_CLINIC_OR_DEPARTMENT_OTHER): Payer: Medicare Other | Admitting: Oncology

## 2019-01-08 VITALS — BP 138/80 | HR 107 | Temp 99.4°F | Resp 18 | Wt 116.5 lb

## 2019-01-08 VITALS — BP 135/78 | HR 110 | Temp 99.4°F | Resp 22 | Wt 115.6 lb

## 2019-01-08 DIAGNOSIS — R509 Fever, unspecified: Secondary | ICD-10-CM

## 2019-01-08 DIAGNOSIS — N182 Chronic kidney disease, stage 2 (mild): Secondary | ICD-10-CM

## 2019-01-08 DIAGNOSIS — Z87891 Personal history of nicotine dependence: Secondary | ICD-10-CM | POA: Diagnosis not present

## 2019-01-08 DIAGNOSIS — Z9221 Personal history of antineoplastic chemotherapy: Secondary | ICD-10-CM | POA: Insufficient documentation

## 2019-01-08 DIAGNOSIS — Z923 Personal history of irradiation: Secondary | ICD-10-CM | POA: Diagnosis not present

## 2019-01-08 DIAGNOSIS — Z7951 Long term (current) use of inhaled steroids: Secondary | ICD-10-CM

## 2019-01-08 DIAGNOSIS — M329 Systemic lupus erythematosus, unspecified: Secondary | ICD-10-CM | POA: Diagnosis not present

## 2019-01-08 DIAGNOSIS — E860 Dehydration: Secondary | ICD-10-CM | POA: Diagnosis present

## 2019-01-08 DIAGNOSIS — C8339 Diffuse large B-cell lymphoma, extranodal and solid organ sites: Secondary | ICD-10-CM

## 2019-01-08 DIAGNOSIS — Z79899 Other long term (current) drug therapy: Secondary | ICD-10-CM

## 2019-01-08 DIAGNOSIS — R4702 Dysphasia: Secondary | ICD-10-CM | POA: Insufficient documentation

## 2019-01-08 DIAGNOSIS — R5381 Other malaise: Secondary | ICD-10-CM

## 2019-01-08 DIAGNOSIS — C83398 Diffuse large b-cell lymphoma of other extranodal and solid organ sites: Secondary | ICD-10-CM

## 2019-01-08 DIAGNOSIS — R0602 Shortness of breath: Secondary | ICD-10-CM

## 2019-01-08 DIAGNOSIS — N189 Chronic kidney disease, unspecified: Secondary | ICD-10-CM | POA: Diagnosis not present

## 2019-01-08 DIAGNOSIS — I10 Essential (primary) hypertension: Secondary | ICD-10-CM | POA: Diagnosis not present

## 2019-01-08 DIAGNOSIS — Z95828 Presence of other vascular implants and grafts: Secondary | ICD-10-CM

## 2019-01-08 DIAGNOSIS — Z85028 Personal history of other malignant neoplasm of stomach: Secondary | ICD-10-CM

## 2019-01-08 DIAGNOSIS — C8330 Diffuse large B-cell lymphoma, unspecified site: Secondary | ICD-10-CM | POA: Diagnosis not present

## 2019-01-08 DIAGNOSIS — D649 Anemia, unspecified: Secondary | ICD-10-CM

## 2019-01-08 DIAGNOSIS — Z7952 Long term (current) use of systemic steroids: Secondary | ICD-10-CM | POA: Diagnosis not present

## 2019-01-08 LAB — CBC WITH DIFFERENTIAL/PLATELET
Abs Immature Granulocytes: 0.01 10*3/uL (ref 0.00–0.07)
Basophils Absolute: 0 10*3/uL (ref 0.0–0.1)
Basophils Relative: 1 %
Eosinophils Absolute: 0.1 10*3/uL (ref 0.0–0.5)
Eosinophils Relative: 8 %
HCT: 34.9 % — ABNORMAL LOW (ref 36.0–46.0)
Hemoglobin: 11.1 g/dL — ABNORMAL LOW (ref 12.0–15.0)
Immature Granulocytes: 1 %
Lymphocytes Relative: 32 %
Lymphs Abs: 0.6 10*3/uL — ABNORMAL LOW (ref 0.7–4.0)
MCH: 32.5 pg (ref 26.0–34.0)
MCHC: 31.8 g/dL (ref 30.0–36.0)
MCV: 102 fL — ABNORMAL HIGH (ref 80.0–100.0)
Monocytes Absolute: 0.6 10*3/uL (ref 0.1–1.0)
Monocytes Relative: 33 %
Neutro Abs: 0.5 10*3/uL — ABNORMAL LOW (ref 1.7–7.7)
Neutrophils Relative %: 25 %
Platelets: 165 10*3/uL (ref 150–400)
RBC: 3.42 MIL/uL — ABNORMAL LOW (ref 3.87–5.11)
RDW: 13.5 % (ref 11.5–15.5)
WBC: 1.8 10*3/uL — ABNORMAL LOW (ref 4.0–10.5)
nRBC: 0 % (ref 0.0–0.2)

## 2019-01-08 LAB — COMPREHENSIVE METABOLIC PANEL
ALT: 13 U/L (ref 0–44)
AST: 21 U/L (ref 15–41)
Albumin: 3.6 g/dL (ref 3.5–5.0)
Alkaline Phosphatase: 62 U/L (ref 38–126)
Anion gap: 11 (ref 5–15)
BUN: 22 mg/dL (ref 8–23)
CO2: 24 mmol/L (ref 22–32)
Calcium: 8.6 mg/dL — ABNORMAL LOW (ref 8.9–10.3)
Chloride: 106 mmol/L (ref 98–111)
Creatinine, Ser: 1.17 mg/dL — ABNORMAL HIGH (ref 0.44–1.00)
GFR calc Af Amer: 55 mL/min — ABNORMAL LOW (ref >60)
GFR calc non Af Amer: 47 mL/min — ABNORMAL LOW (ref >60)
Glucose, Bld: 135 mg/dL — ABNORMAL HIGH (ref 70–99)
Potassium: 3.6 mmol/L (ref 3.5–5.1)
Sodium: 141 mmol/L (ref 135–145)
Total Bilirubin: 0.3 mg/dL (ref 0.3–1.2)
Total Protein: 6.1 g/dL — ABNORMAL LOW (ref 6.5–8.1)

## 2019-01-08 LAB — URINALYSIS, COMPLETE (UACMP) WITH MICROSCOPIC
Bilirubin Urine: NEGATIVE
Glucose, UA: NEGATIVE mg/dL
Hgb urine dipstick: NEGATIVE
Ketones, ur: NEGATIVE mg/dL
Leukocytes,Ua: NEGATIVE
Nitrite: NEGATIVE
Protein, ur: 100 mg/dL — AB
Specific Gravity, Urine: 1.015 (ref 1.005–1.030)
pH: 6 (ref 5.0–8.0)

## 2019-01-08 LAB — MAGNESIUM: Magnesium: 2.1 mg/dL (ref 1.7–2.4)

## 2019-01-08 MED ORDER — SODIUM CHLORIDE 0.9% FLUSH
10.0000 mL | INTRAVENOUS | Status: DC | PRN
  Administered 2019-01-08: 10 mL via INTRAVENOUS
  Filled 2019-01-08: qty 10

## 2019-01-08 MED ORDER — HEPARIN SOD (PORK) LOCK FLUSH 100 UNIT/ML IV SOLN
500.0000 [IU] | Freq: Once | INTRAVENOUS | Status: AC
  Administered 2019-01-08: 500 [IU] via INTRAVENOUS

## 2019-01-08 MED ORDER — SODIUM CHLORIDE 0.9 % IV SOLN
Freq: Once | INTRAVENOUS | Status: AC
  Administered 2019-01-08: 11:00:00 via INTRAVENOUS
  Filled 2019-01-08: qty 250

## 2019-01-08 NOTE — Progress Notes (Signed)
Symptom Management Consult note [facility]  Telephone:([phone] [phone]  Patient Care Team: [name], MD as PCP - General (Internal Medicine) Emmaline Kluver., MD (Rheumatology) Virgel Manifold, MD as Consulting Physician (Gastroenterology)   Name of the patient: [name]  [id number]  1946/01/29   Date of visit: 01/08/2019   Diagnosis- 1. Malaise - DG Chest 2 View; Future  2. Shortness of breath - DG Chest 2 View; Future   Chief complaint/ Reason for visit- Malaise/Weakness; dysphagia  Heme/Onc history:  Oncology History Overview Note   In Nancy Fetter Hershkowitz is a 73 y.o. 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  IPI is 3 (intermediate-high risk), age-adjusted IPI is 2 (low-intermediate risk), revised IPI is 3 (poor), and NCCN-IPI is 5 (intermediate-high risk).  CNS risk is 3 (intermediate: age, LDH, stage).   EGD on 03/15/2018 revealed pathology from the duodenal scalloped lesion and duodenal sweep ulcer with involvement with diffuse large B cell lymphoma.  Random gastric biopsy was negative for lymphoma.  There was mild chronic gastritis with reactive foveolar hyperplasia.  Gastric fundus scar revealed involvement with diffuse large B cell lymphoma.  GE junction biopsy revealed no lymphoma with squamocolumnar mucosa with mild chronic active inflammation.   The lymphoma has a non-germinal center immunophenotype with a Ki-67 proliferation index of 60% and shows Bcl-2/CMYC co-expression by  immunohistochemical analysis.   Summary of IHC results, neoplastic cells: Positive: PAX-5, CD20(variable), Bcl-2, MUM1, Bcl-6 (variable), CMYC.  Negative: CD10, CD5, CD30, CD138, Cyclin-D1, ALK-1, CD3.  EBV (ISH): Negative.  FISH for MYC gene rearrangement (8q24 gene region) was negative.   PET scan on 04/06/2018 revealed enlarged and hypermetabolic supraclavicular, mediastinal,  abdominal and pelvic adenopathy. Additional smaller hypermetabolic lymph nodes identified within the left cervical lymph node chain. Largest lymph node was a 5.7 cm subcarinal lymph node (SUV 26.78).   Bone marrow aspirate and biopsy on 04/12/2018 revealed a slightly hypercellular bone marrow for age with trilineage hematopoiesis.  There were dyspoietic changes associated with the granulocyte series.  Flow cytometry revealed a predominance of T lymphocytes with relative abundance of CD8 + cells.  There was no significant B cell population.  Cytogenetics are pending.   Echo on 04/04/2018 revealed an EF of 55-60%.   She presented with a normocytic anemia.  She was admitted x 2 (12/2017 and 01/2018) with rectal bleeding.  She is intolerant of oral iron.  Diet is modest.   EGD on 12/30/2017 revealed non bleeding gastric erosions in the antrum.  Colonoscopy on 12/30/2017 revealed numerous polyps (one 5 mm in ascending colon, two 7 mm polyps in transverse colon, five 5-7 mm polyps in the descending colon, one 7 mm polyp in the sigmoid colon, one 8 mm polyp in the recto-sigmoid colon, and one 19 mm polyp in the rectum). 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     She developed hematochezia post colonoscopy.  Flexible sigmoidoscopy on 12/31/2017 revealed a rectal ulcer at the site of the polypectomy and blood in the rectum.  Clips were placed.  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   Work-up on 04/03/2018 revealed a hematocrit of 32.5, hemoglobin 10.7, MCV 91.7, platelets 207,000, WBC 1800 with an ANC of 600.  Uric acid was 8.8.  LDH was 241 (98-192) and beta2-microglobulin  5.8 (0.6-2.4).  Negative studies included:  hepatitis B core antibody total, hepatitis B surface antigen, hepatitis C antibody, HIV testing.      She has required 4 units of PRBCs (3 units in 12/2017  and 1 unit in 01/2018).   Ferritin has been followed: 30 on 12/29/2017, 93 on 02/13/2018, 57 on 03/07/2018, and 38 on 04/03/2018.   Labs on 12/29/2017 revealed the following:  B12 was 254 (low normal).  She has been on oral B12 since 03/07/2018.  B12 was 1524 on 04/03/2018.  Folate 6.3 (> 5.9) on 12/29/2017 and 55.8 on 04/03/2018.     She is s/p 6 cycles of mini-RCHOP (04/24/2018 - 08/31/2018) with growth factor support (Udencya then On-Pro Neulasta).  Cycle #1 platelet nadir was 82,000. Cycle #4 was postponed on 07/13/2018 ([place] 0).    Chest, abdomen, and pelvic CT on 07/03/2018 revealed marked interval response to therapy. Previously identified relatively bulky lymphadenopathy in the chest, abdomen, and pelvis had resolved with no pathologically enlarged lymphadenopathy.  There was no new or progressive interval findings.   PET scan on 09/25/2018 revealing a partial response to chemotherapy.  Bulky mediastinal adenopathy nearly resolved, however a solitary 1.3 cm nodule remained hypermetabolic (SUV 6.7).  There was a 3 mm (previously 2 cm; SUV 18) retroperitoneal lymph node adjacent to the celiac trunk with no associated metabolic activity.   She has chronic renal insufficiency.  Creatinine was 1.42 (CrCl 25 ml/min) on 04/03/2018.   She has lupus (diagnosed 1982). She is on prednisone 5 mg a day and Plaquenil.    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 (HCC)  04/03/2018 Initial Diagnosis   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 (HCC)   04/24/2018 -  Chemotherapy   The patient had DOXOrubicin (ADRIAMYCIN) chemo injection 34 mg, 25 mg/m2 = 34 mg (50 % of original dose 50 mg/m2), Intravenous,  Once, 6 of 7 cycles Dose modification: 25 mg/m2 (original dose 50 mg/m2, Cycle 1, Reason: Provider Judgment, Comment: advance dose as tolerated), 25 mg/m2 (original dose 50 mg/m2, Cycle 3, Reason: Provider  Judgment, Comment: mini-RCHOP) Administration: 34 mg (04/24/2018), 34 mg (05/15/2018), 34 mg (06/07/2018), 34 mg (07/21/2018), 34 mg (08/10/2018), 34 mg (08/31/2018) palonosetron (ALOXI) injection 0.25 mg, 0.25 mg, Intravenous,  Once, 6 of 7 cycles Administration: 0.25 mg (04/24/2018), 0.25 mg (05/15/2018), 0.25 mg (06/07/2018), 0.25 mg (07/21/2018), 0.25 mg (08/10/2018), 0.25 mg (08/31/2018) pegfilgrastim (NEULASTA ONPRO KIT) injection 6 mg, 6 mg, Subcutaneous, Once, 3 of 4 cycles Administration: 6 mg (07/21/2018), 6 mg (08/10/2018), 6 mg (08/31/2018) pegfilgrastim-cbqv (UDENYCA) injection 6 mg, 6 mg, Subcutaneous, Once, 3 of 3 cycles Administration: 6 mg (04/25/2018), 6 mg (05/16/2018), 6 mg (06/08/2018) vinCRIStine (ONCOVIN) 1 mg in sodium chloride 0.9 % 50 mL chemo infusion, 1 mg (50 % of original dose 2 mg), Intravenous,  Once, 6 of 7 cycles Dose modification: 1 mg (original dose 2 mg, Cycle 1, Reason: Provider Judgment, Comment: advance dose as tolerated), 1 mg (original dose 2 mg, Cycle 3, Reason: Provider Judgment, Comment: mini-RCHOP) Administration: 1 mg (04/24/2018), 1 mg (05/15/2018), 1 mg (06/07/2018), 1 mg (07/21/2018), 1 mg (08/10/2018), 1 mg (08/31/2018) riTUXimab (RITUXAN) 500 mg in sodium chloride 0.9 % 250 mL (1.6667 mg/mL) infusion, 375 mg/m2 = 500 mg, Intravenous,  Once, 6 of 7 cycles Administration: 500 mg (04/24/2018) cyclophosphamide (CYTOXAN) 560 mg in sodium chloride 0.9 % 250 mL chemo infusion, 400 mg/m2 = 560 mg (53.3 % of original  dose 750 mg/m2), Intravenous,  Once, 6 of 7 cycles Dose modification: 400 mg/m2 (original dose 750 mg/m2, Cycle 1, Reason: Provider Judgment, Comment: advance dose as tolerated), 400 mg/m2 (original dose 750 mg/m2, Cycle 3, Reason: Provider Judgment, Comment: mini-RCHOP) Administration: 560 mg (04/24/2018), 560 mg (05/15/2018), 560 mg (06/07/2018), 560 mg (07/21/2018), 560 mg (08/10/2018), 560 mg (08/31/2018)  for chemotherapy treatment.     Interval history-  Dysphagia: Patient presents with weakness, malaise, intermittent dysphasia and dehydration.  Symptoms developed approximately 2 weeks ago.  She was recently treated for a Klebsiella pneumoniae UTI where she developed a fever as high as 103.1, abdominal pain and diarrhea.  She has recovered.  She recently completed consolidation radiation for her stage IVb cell lymphoma.  States her appetite is fair and she is drinking very little fluids.  She is able to tolerate soft and liquid foods.  She is using her Carafate as prescribed by Dr. Donella Stade.  She does not require pain medicine.  She denies hoarseness but admits to a "tickling" in her throat and dry cough at times.  She denies nausea or vomiting.  Has been having normal bowel movements.  Her weight has remained stable.  She denies heartburn or aspiration.  ECOG FS:1 - Symptomatic but completely ambulatory  Review of systems- Review of Systems  Constitutional: Positive for malaise/fatigue. Negative for chills, fever and weight loss.  HENT: Negative for congestion, ear pain and tinnitus.   Eyes: Negative.  Negative for blurred vision and double vision.  Respiratory: Positive for cough and shortness of breath. Negative for sputum production.   Cardiovascular: Positive for leg swelling. Negative for chest pain and palpitations.  Gastrointestinal: Negative.  Negative for abdominal pain, constipation, diarrhea, nausea and vomiting.  Genitourinary: Negative for dysuria, frequency and urgency.  Musculoskeletal: Negative for back pain and falls.  Skin: Negative.  Negative for rash.  Neurological: Positive for weakness. Negative for headaches.  Endo/Heme/Allergies: Negative.  Does not bruise/bleed easily.  Psychiatric/Behavioral: Negative.  Negative for depression. The patient is not nervous/anxious and does not have insomnia.      Current treatment-status post 6 cycles mini R-CHOP in February 2020.  Recently completed consolidation radiation with Dr. Donella Stade  May 2020.  Allergies  Allergen Reactions  . Penicillins Other (See Comments)    Pass out Has patient had a PCN reaction causing immediate rash, facial/tongue/throat swelling, SOB or lightheadedness with hypotension: Yes Has patient had a PCN reaction causing severe rash involving mucus membranes or skin necrosis: No Has patient had a PCN reaction that required hospitalization: No Has patient had a PCN reaction occurring within the last 10 years: No If all of the above answers are "NO", then may proceed with Cephalosporin use.   . Sulfa Antibiotics Nausea Only  . Sulfasalazine Nausea And Vomiting       . Ace Inhibitors Nausea Only  . Losartan Anxiety  . Spironolactone Other (See Comments)    Felt poorly but couldn't clarify      Past Medical History:  Diagnosis Date  . Cancer [facility])    Stomach cancer  . Chronic kidney disease   . GERD (gastroesophageal reflux disease)   . HTN (hypertension)   . Hyperlipidemia   . Lupus ([place])   . S/P angioplasty with stent      Past Surgical History:  Procedure Laterality Date  . CARDIAC SURGERY    . CESAREAN SECTION    . COLONOSCOPY N/A 12/30/2017   Procedure: COLONOSCOPY;  Surgeon: Lin Landsman, MD;  Location: [facility] ENDOSCOPY;  Service: Gastroenterology;  Laterality: N/A;  . CORONARY ARTERY BYPASS GRAFT  2003  . ESOPHAGOGASTRODUODENOSCOPY N/A 12/30/2017   Procedure: ESOPHAGOGASTRODUODENOSCOPY (EGD);  Surgeon: Lin Landsman, MD;  Location: [facility] ENDOSCOPY;  Service: Gastroenterology;  Laterality: N/A;  . ESOPHAGOGASTRODUODENOSCOPY Left 02/05/2018   Procedure: ESOPHAGOGASTRODUODENOSCOPY (EGD);  Surgeon: Virgel Manifold, MD;  Location: [facility] ENDOSCOPY;  Service: Endoscopy;  Laterality: Left;  . ESOPHAGOGASTRODUODENOSCOPY (EGD) WITH PROPOFOL N/A 03/15/2018   Procedure: ESOPHAGOGASTRODUODENOSCOPY (EGD) WITH PROPOFOL;  Surgeon: Virgel Manifold, MD;  Location: [facility] ENDOSCOPY;  Service: Endoscopy;  Laterality: N/A;  . FLEXIBLE  SIGMOIDOSCOPY N/A 12/31/2017   Procedure: FLEXIBLE SIGMOIDOSCOPY;  Surgeon: Jonathon Bellows, MD;  Location: [facility] ENDOSCOPY;  Service: Gastroenterology;  Laterality: N/A;  . PORTA CATH INSERTION N/A 04/19/2018   Procedure: PORTA CATH INSERTION;  Surgeon: Algernon Huxley, MD;  Location: [place] CV LAB;  Service: Cardiovascular;  Laterality: N/A;    Social History   Socioeconomic History  . Marital status: Married    Spouse name: Not on file  . Number of children: Not on file  . Years of education: Not on file  . Highest education level: Not on file  Occupational History  . Not on file  Social Needs  . Financial resource strain: Not hard at all  . Food insecurity    Worry: Never true    Inability: Not on file  . Transportation needs    Medical: Patient refused    Non-medical: Patient refused  Tobacco Use  . Smoking status: Former Smoker    Packs/day: 0.50    Years: 20.00    Pack years: 10.00    Types: Cigarettes    Quit date: 03/07/2002    Years since quitting: 16.8  . Smokeless tobacco: Never Used  Substance and Sexual Activity  . Alcohol use: Not Currently  . Drug use: Never  . Sexual activity: Not on file  Lifestyle  . Physical activity    Days per week: Not on file    Minutes per session: Not on file  . Stress: Not at all  Relationships  . Social Herbalist on phone: Not on file    Gets together: Not on file    Attends religious service: Not on file    Active member of club or organization: Not on file    Attends meetings of clubs or organizations: Not on file    Relationship status: Not on file  . Intimate partner violence    Fear of current or ex partner: No    Emotionally abused: No    Physically abused: No    Forced sexual activity: No  Other Topics Concern  . Not on file  Social History Narrative  . Not on file    Family History  Problem Relation Age of Onset  . Hypertension Mother   . Stroke Mother   . Vascular Disease Mother   .  Hypertension Sister      Current Outpatient Medications:  .  acetaminophen (TYLENOL) 500 MG tablet, Take 1,000 mg by mouth 2 (two) times daily as needed for moderate pain or headache., Disp: , Rfl:  .  alendronate (FOSAMAX) 70 MG tablet, Take 70 mg by mouth every Saturday. , Disp: , Rfl:  .  allopurinol (ZYLOPRIM) 100 MG tablet, TAKE 1/2 TABLET BY MOUTH EVERY DAY, Disp: 45 tablet, Rfl: 1 .  amLODipine (NORVASC) 10 MG tablet, Take 10 mg by mouth daily., Disp: , Rfl:  .  clotrimazole-betamethasone (LOTRISONE) cream, Apply 1 application topically 2 (two) times daily as needed (rash). , Disp: , Rfl:  .  ferrous sulfate 325 (65 FE) MG tablet, Take 1 tablet (325 mg total) by mouth 2 (two) times daily with a meal., Disp: 60 tablet, Rfl: 3 .  fluticasone (FLONASE) 50 MCG/ACT nasal spray, Place 1 spray into both nostrils daily as needed for allergies or rhinitis., Disp: , Rfl:  .  hydroxychloroquine (PLAQUENIL) 200 MG tablet, Take 200 mg by mouth 2 (two) times daily. , Disp: , Rfl:  .  lidocaine-prilocaine (EMLA) cream, Apply to affected area once, Disp: 30 g, Rfl: 3 .  Multiple Vitamin (MULTIVITAMIN WITH MINERALS) TABS tablet, Take 1 tablet by mouth daily., Disp: 180 tablet, Rfl: 0 .  predniSONE (DELTASONE) 5 MG tablet, Take 5 mg by mouth daily with breakfast. , Disp: , Rfl:  .  Pseudoephedrine-APAP 30-325 MG TABS, Take 1 tablet by mouth daily as needed (sinuses). , Disp: , Rfl:  .  ranitidine (ZANTAC) 150 MG tablet, Take 150 mg by mouth daily as needed for heartburn. , Disp: , Rfl:  .  simvastatin (ZOCOR) 40 MG tablet, Take 40 mg by mouth daily. , Disp: , Rfl:  .  sucralfate (CARAFATE) 1 g tablet, Take 1 tablet (1 g total) by mouth 3 (three) times daily. Dissolve in 3-4 tbsp warm water, swish and swallow, Disp: 90 tablet, Rfl: 3 .  traMADol (ULTRAM) 50 MG tablet, Take 50 mg by mouth 2 (two) times daily as needed for moderate pain. , Disp: , Rfl:  .  dexamethasone (DECADRON) 4 MG tablet, Take 1 tablet  (4 mg total) by mouth daily. (Patient not taking: Reported on 01/08/2019), Disp: 30 tablet, Rfl: 0 .  feeding supplement, ENSURE ENLIVE, (ENSURE ENLIVE) LIQD, Take 237 mLs by mouth 2 (two) times daily between meals. (Patient not taking: Reported on 10/18/2018), Disp: 90 Bottle, Rfl: 0 .  ondansetron (ZOFRAN) 4 MG tablet, Take 1 tablet (4 mg total) by mouth 2 (two) times daily as needed for refractory nausea / vomiting. Start on day 3 after cyclophosphamide chemotherapy. (Patient not taking: Reported on 01/08/2019), Disp: 30 tablet, Rfl: 3 .  polyethylene glycol (MIRALAX / GLYCOLAX) packet, Take 17 g by mouth daily., Disp: , Rfl:  .  predniSONE (DELTASONE) 20 MG tablet, Take 3 tablets (60 mg total) by mouth daily. Take on days 1-5 of chemotherapy. (Patient not taking: Reported on 01/08/2019), Disp: 15 tablet, Rfl: 5  Physical exam:  Vitals:   01/08/19 1020  BP: 135/78  Pulse: (!) 110  Resp: (!) 22  Temp: 99.4 F (37.4 C)  TempSrc: Tympanic  SpO2: 94%  Weight: 115 lb 9.6 oz (52.4 kg)   Physical Exam Vitals signs reviewed.  Constitutional:      Appearance: Normal appearance.  HENT:     Head: Normocephalic and atraumatic.  Eyes:     Pupils: Pupils are equal, round, and reactive to light.  Neck:     Musculoskeletal: Normal range of motion.  Cardiovascular:     Rate and Rhythm: Regular rhythm. Tachycardia present.     Heart sounds: Normal heart sounds. No murmur.  Pulmonary:     Effort: Pulmonary effort is normal.     Breath sounds: Normal breath sounds. No wheezing.  Abdominal:     General: Bowel sounds are normal. There is no distension.     Palpations: Abdomen is soft.     Tenderness: There is no abdominal tenderness.  Musculoskeletal: Normal range of motion.  Skin:    General: Skin is warm and dry.     Findings: No rash.  Neurological:     Mental Status: She is alert and oriented to person, place, and time.  Psychiatric:        Judgment: Judgment normal.      CMP Latest Ref  Rng & Units 01/08/2019  Glucose 70 - 99 mg/dL 135(H)  BUN 8 - 23 mg/dL 22  Creatinine 0.44 - 1.00 mg/dL 1.17(H)  Sodium 135 - 145 mmol/L 141  Potassium 3.5 - 5.1 mmol/L 3.6  Chloride 98 - 111 mmol/L 106  CO2 22 - 32 mmol/L 24  Calcium 8.9 - 10.3 mg/dL 8.6(L)  Total Protein 6.5 - 8.1 g/dL 6.1(L)  Total Bilirubin 0.3 - 1.2 mg/dL 0.3  Alkaline Phos 38 - 126 U/L 62  AST 15 - 41 U/L 21  ALT 0 - 44 U/L 13   CBC Latest Ref Rng & Units 01/08/2019  WBC 4.0 - 10.5 K/uL 1.8(L)  Hemoglobin 12.0 - 15.0 g/dL 11.1(L)  Hematocrit 36.0 - 46.0 % 34.9(L)  Platelets 150 - 400 K/uL 165    No images are attached to the encounter.  No results found.   Assessment and plan- Patient is a 73 y.o. female who presents to [facility] for weakness, malaise and "feeling poorly".  Symptoms have been present for approximately 2 weeks and appear to be worsening.  Stage IVb lymphoma: Status post 6 cycles mini R-CHOP.  Completing in March 2020.  Recently completed consolidation radiation approximately 1 month ago.  Does not have follow-up at this time with Dr. Mike Gip.  Per [facility], patient needs repeat PET scan 3 months after completion of radiation.   Dysphagia: Likely due to radiation.  Will order her helios an amino acid supplement to help tissue development.  Continue Carafate as prescribed.  Weakness/malaise: Likely due to dehydration.  Elevated creatinine today, protein in urine and tachycardia.  Improved with IV fluids.  Neutropenia: On chronic steroids due to history of lupus.  5 mg prednisone daily.  Plan: Stat labs.  1 L NaCl.  Mild dehydration but otherwise okay. Stat chest x-ray: Revealed bilateral perihilar and medial upper lobe interstitial prominence.  Could be related to radiation therapy such as infectious pneumonitis or lymphangitic tumor spread. UA/urine culture. Negative.   Disposition: We will reach out to Dr. Patsey Berthold for further recommendations and review of most recent imaging.  Dr. Patsey Berthold or  Dr. Ashby Dawes will see patient this week in clinic.  She recommends CT chest to be completed prior.  We will get this arranged. We will get patient set up for a return visit with Dr. Mike Gip in the next month.   Visit Diagnosis 1. Malaise   2. Shortness of breath     Patient expressed understanding and was in agreement with this plan. She also understands that She can call clinic at any time with any questions, concerns, or complaints.   Greater than 50% was spent in counseling and coordination of care with this patient including but not limited to discussion of the relevant topics above (See A&P) including, but not limited to diagnosis and management of acute and chronic medical conditions.   Thank you for allowing me to participate in the care of this very pleasant patient.    Jacquelin Hawking, NP [place] [facility] Cell - [phone] Pager- [phone] 01/08/2019 11:44 AM

## 2019-01-08 NOTE — Progress Notes (Signed)
Radiation Oncology Follow up Note  Name: Judy Torres   Date:   01/08/2019 MRN:  [id number] DOB: [birth year]    This 73 y.o. female presents to the clinic today for 1 month follow-up status post adjuvant radiation therapy to her chest for stage IVb diffuse B-cell lymphoma status post 6 cycles of mini R-CHOP with excellent response although persistent disease Judy the mediastinum.Marland Kitchen  REFERRING PROVIDER: [name], MD  HPI: Patient is a 73 year old female now seen at 1 month having completed involved field radiation therapy to her chest for stage IVb diffuse B-cell lymphoma status post 6 cycles of mini R-CHOP.  Seen today Judy routine follow-up she is doing fair.  She still states she feels weak overall no fever chills or night sweats still has some persistent dysphasia although has discontinued Carafate rinses because she states it upsets her stomach..  COMPLICATIONS OF TREATMENT: none  FOLLOW UP COMPLIANCE: keeps appointments   PHYSICAL EXAM:  BP 138/80   Pulse (!) 107   Temp 99.4 F (37.4 C)   Resp 18   Wt 116 lb 8.2 oz (52.9 kg)   BMI 23.53 kg/m  Well-developed well-nourished patient Judy NAD. HEENT reveals PERLA, EOMI, discs not visualized.  Oral cavity is clear. No oral mucosal lesions are identified. Neck is clear without evidence of cervical or supraclavicular adenopathy. Lungs are clear to A&P. Cardiac examination is essentially unremarkable with regular rate and rhythm without murmur rub or thrill. Abdomen is benign with no organomegaly or masses noted. Motor sensory and DTR levels are equal and symmetric Judy the upper and lower extremities. Cranial nerves II through XII are grossly intact. Proprioception is intact. No peripheral adenopathy or edema is identified. No motor or sensory levels are noted. Crude visual fields are within normal range.  RADIOLOGY RESULTS: No current films for review  PLAN: Present time I am sending her to symptom management for continued dysphasia.   Also making sure she has a follow-up appoint with Dr. Mike Gip.  She may benefit from a PET CT scan Judy the near future to establish disease response.  I have asked to see her back Judy 4 months for follow-up.  Patient knows to call sooner with any concerns.  I would like to take this opportunity to thank you for allowing me to participate Judy the care of your patient.Noreene Filbert, MD

## 2019-01-09 ENCOUNTER — Ambulatory Visit
Admission: RE | Admit: 2019-01-09 | Discharge: 2019-01-09 | Disposition: A | Discharge status: Home / Self Care | Payer: Medicare Other | Source: Ambulatory Visit | Attending: Oncology | Admitting: Oncology

## 2019-01-09 ENCOUNTER — Telehealth: Payer: Self-pay

## 2019-01-09 DIAGNOSIS — R0602 Shortness of breath: Secondary | ICD-10-CM | POA: Insufficient documentation

## 2019-01-09 LAB — URINE CULTURE: Culture: NO GROWTH

## 2019-01-09 NOTE — Telephone Encounter (Signed)
Received following message via secure chat from Dr. Patsey Berthold: "Good afternoon Dr. Patsey Berthold. I evaluated Mrs. Judy Torres in clinic today for complaints of weakness and general malaise. 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 Most recent treatment was consolidation radiation to her mediastinum. She completed therapy approximately 1 month ago. She was evaluated in clinic today and appeared to be significantly dehydrated due to dysphasia/esophagitis from radiation. While in clinic, she complained of intermittent shortness of breath mainly with exertion. Oxygen saturations maintained in low to mid 90s. She also complained of dry cough. She was recently tested for COVID-19 and it was negative. I had her get a chest x-ray this afternoon and results reveal either radiation pneumonitis or lymphangitic tumor spread. I spoke with Dr. Mike Gip who reviewed her chest x-ray with me and asked that I have her be seen by you ASAP. The patient is stable and is at home. I wanted to give you a heads up. I have placed the appropriate orders for the referral process. I will was wondering if you would like additional imaging prior to you seeing her? I would be happy to place his orders and have it arranged if you feel it is needed. Thank you so much."  I have called patient to attempt to set up apt with Dr. Ashby Dawes for later this week after CT scan.

## 2019-01-10 NOTE — Telephone Encounter (Signed)
Called pt to schedule appt. She was resting but I spoke with husband Judy Torres  (DPR). He stated that pt wanted to be seen after July 1st when she sees her doctor at the [facility]. I let him know that she may want her seen sooner than that but he agreed to speak with wife and run it by her doctor to confirm if this appt. Is ok. Pt will give [place] a call if any changes are needed. Appt. Scheduled July 2nd with Dr. Patsey Berthold. Nothing further needed.

## 2019-01-11 NOTE — Progress Notes (Signed)
Results from CT chest. Just FYI.   Faythe Casa, NP 01/11/2019 10:32 AM

## 2019-01-16 ENCOUNTER — Telehealth: Payer: Self-pay | Admitting: Pulmonary Disease

## 2019-01-16 NOTE — Telephone Encounter (Signed)

## 2019-01-17 ENCOUNTER — Ambulatory Visit: Payer: Medicare Other | Admitting: Hematology and Oncology

## 2019-01-17 ENCOUNTER — Other Ambulatory Visit: Payer: Medicare Other

## 2019-01-17 NOTE — Progress Notes (Signed)
[facility]  [address] [place], [place] [address] Phone: [phone]  Fax: [phone]   Clinic Day:  01/22/2019  Referring physician: [name], MD  Chief Complaint: [name] is a 73 y.o. 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   HPI:  The patient was last seen in the medical oncology clinic on 01/08/2019 by Faythe Casa, NP. Previously, she was seen on 08/31/2018.  At that time, she had weakness, malaise, intermittent dysphasia and dehydration for approximately 2 weeks. She was recovering from a recent UTI where she had developed a fever of 103.1. Her appetite was fair ad she could tolerate soft and liquid foods. She was given IV fluids, started on an amino acid supplement, and scheduled for a PET scan. She continued on 44m prednisone daily for her neutropenia. Dr. GPatsey Bertholdwas contacted to review imaging. Follow-up was scheduled with Dr. RAshby Dawes   PET scan on 09/25/2018 revealed a partial response to chemotherapy. The bulky intensely hypermetabolic mediastinal adenopathy was near completely resolved; however a solitary 1.3 cm node remained with intense radiotracer activity (Deauville 4). There was complete metabolic response and size reduction of cervical lymph nodes, retroperitoneal periaortic nodes and pelvic nodes (Deauville 1).  Her case was discussed in Tumor Board on 09/28/2018. [facility] followed by possible chemotherapy and palliative radiation for progression.   [facility].  Recommendation was for radiation to the solitary residual lymph node.  The likelihood of systemic relapse was felt high in the next 3-6 months.  PET scan 3 months after radiation was recommended.  If she relapses after radiation, Rituxan and Revlimid with dose reduction of Revlimid were recommended.    She was seen in  radiation oncology by Dr. CBaruch Goutyon 10/18/2018. She was advised to undergo involved field radiation therapy with 3000 cGy to the mediastinum and 3600 cGy to the hypermetabolic lymph node. She underwent radiation from 11/01/2018 - 12/04/2018.  CXR on 01/08/2019 revealed bilateral perihilar and medial upper lobe interstitial prominence, possibly related to radiation therapy. Infectious pneumonitis and or lymphangitic tumor spread caould not be excluded. Port-a-cath was in place.   She saw Dr. CBaruch Goutyafter completion of radiation on 01/08/2019. She was sent to symptom management for dysphasia. He scheduled a 463-monthollow-up.   Chest CT without contrast on 01/09/2019 revealed severe bilateral paramediastinal radiation fibrosis changes. There was underlying emphysematous changes and pulmonary scarring. There were no findings suspicious for pulmonary metastatic disease. There was interval decrease in size of the precarinal lymph node when compared to prior study. There was stable advanced atherosclerotic calcifications involving the thoracic and upper abdominal aorta and branch vessels including dense three-vessel coronary artery calcifications.  She was seen in pulmonology on 01/18/2019 by Dr. CaVernard Gambles Echo and pulmonary function tests (PFTs) were ordered. A 2-70-monthllow-up was scheduled.   Labs followed:  09/27/2018: WBC 8,600, hemoglobin 10.4, hematocrit 32.2, MCV 102.9, platelet 134,000. Calcium 8.6. LDH 285. Uric acid 5.5. 11/20/2018: WBC 7,600, hemoglobin 11.6, hematocrit 35.8, MCV 102.3, platelet 109,000. 01/08/2019: WBC 1,800, hemoglobin 11.1, hematocrit 34.9, MCV 102.0, platelet 165,000. Calcium 8.6. Magnesium 2.1. Urinalysis showed protein and bacteria. Urine culture was negative.   During the interim, she has done well. Her husband is her primary historian. She denies any fevers or sweats. He notes she has been gaining her strength back and doing  better each day. He notes her  weight is stable and she is eating well most days. He notes she fell in the garden last week. She denies any issues breathing or shortness of breath. She had a UTI two weeks ago and was put on cipro and it has resolved.    Past Medical History:  Diagnosis Date  . Cancer [facility])    Stomach cancer  . Chronic kidney disease   . GERD (gastroesophageal reflux disease)   . HTN (hypertension)   . Hyperlipidemia   . Lupus ([place])   . S/P angioplasty with stent     Past Surgical History:  Procedure Laterality Date  . CARDIAC SURGERY    . CESAREAN SECTION    . COLONOSCOPY N/A 12/30/2017   Procedure: COLONOSCOPY;  Surgeon: Lin Landsman, MD;  Location: [facility] ENDOSCOPY;  Service: Gastroenterology;  Laterality: N/A;  . CORONARY ARTERY BYPASS GRAFT  2003  . ESOPHAGOGASTRODUODENOSCOPY N/A 12/30/2017   Procedure: ESOPHAGOGASTRODUODENOSCOPY (EGD);  Surgeon: Lin Landsman, MD;  Location: [facility] ENDOSCOPY;  Service: Gastroenterology;  Laterality: N/A;  . ESOPHAGOGASTRODUODENOSCOPY Left 02/05/2018   Procedure: ESOPHAGOGASTRODUODENOSCOPY (EGD);  Surgeon: Virgel Manifold, MD;  Location: [facility] ENDOSCOPY;  Service: Endoscopy;  Laterality: Left;  . ESOPHAGOGASTRODUODENOSCOPY (EGD) WITH PROPOFOL N/A 03/15/2018   Procedure: ESOPHAGOGASTRODUODENOSCOPY (EGD) WITH PROPOFOL;  Surgeon: Virgel Manifold, MD;  Location: [facility] ENDOSCOPY;  Service: Endoscopy;  Laterality: N/A;  . FLEXIBLE SIGMOIDOSCOPY N/A 12/31/2017   Procedure: FLEXIBLE SIGMOIDOSCOPY;  Surgeon: Jonathon Bellows, MD;  Location: [facility] ENDOSCOPY;  Service: Gastroenterology;  Laterality: N/A;  . PORTA CATH INSERTION N/A 04/19/2018   Procedure: PORTA CATH INSERTION;  Surgeon: Algernon Huxley, MD;  Location: [facility] LAB;  Service: Cardiovascular;  Laterality: N/A;    Family History  Problem Relation Age of Onset  . Hypertension Mother   . Stroke Mother   . Vascular Disease Mother   . Hypertension Sister     Social History:  reports that she  quit smoking about 16 years ago. Her smoking use included cigarettes. She has a 10.00 pack-year smoking history. She has never used smokeless tobacco. She reports previous alcohol use. She reports that she does not use drugs. She has smoked 1/2 pack/day x 20 years.  She stopped smoking in 2000.  She denies any exposure to radiation or toxins.  She is from [place].  She moved to the [place] 52 years ago after marrying her husband.  She was in the [employer].  The patient is alone today with her husband over the phone [phone]).   Allergies:  Allergies  Allergen Reactions  . Penicillins Other (See Comments)    Pass out Has patient had a PCN reaction causing immediate rash, facial/tongue/throat swelling, SOB or lightheadedness with hypotension: Yes Has patient had a PCN reaction causing severe rash involving mucus membranes or skin necrosis: No Has patient had a PCN reaction that required hospitalization: No Has patient had a PCN reaction occurring within the last 10 years: No If all of the above answers are "NO", then may proceed with Cephalosporin use.   . Sulfa Antibiotics Nausea Only  . Sulfasalazine Nausea And Vomiting       . Ace Inhibitors Nausea Only  . Losartan Anxiety  . Spironolactone Other (See Comments)    Felt poorly but couldn't clarify     Current Medications: Current Outpatient Medications  Medication Sig Dispense Refill  . acetaminophen (TYLENOL) 500 MG tablet Take 1,000 mg by mouth 2 (two) times daily as  needed for moderate pain or headache.    . alendronate (FOSAMAX) 70 MG tablet Take 70 mg by mouth every Saturday.     Marland Kitchen allopurinol (ZYLOPRIM) 100 MG tablet TAKE 1/2 TABLET BY MOUTH EVERY DAY 45 tablet 1  . amLODipine (NORVASC) 10 MG tablet Take 10 mg by mouth daily.    Marland Kitchen dexamethasone (DECADRON) 4 MG tablet Take 1 tablet (4 mg total) by mouth daily. 30 tablet 0  . feeding supplement, ENSURE ENLIVE, (ENSURE ENLIVE) LIQD Take 237 mLs by mouth 2 (two) times daily  between meals. 90 Bottle 0  . ferrous sulfate 325 (65 FE) MG tablet Take 1 tablet (325 mg total) by mouth 2 (two) times daily with a meal. 60 tablet 3  . hydroxychloroquine (PLAQUENIL) 200 MG tablet Take 200 mg by mouth 2 (two) times daily.     . Multiple Vitamin (MULTIVITAMIN WITH MINERALS) TABS tablet Take 1 tablet by mouth daily. 180 tablet 0  . polyethylene glycol (MIRALAX / GLYCOLAX) packet Take 17 g by mouth daily.    . predniSONE (DELTASONE) 5 MG tablet Take 5 mg by mouth daily with breakfast.     . ranitidine (ZANTAC) 150 MG tablet Take 150 mg by mouth daily as needed for heartburn.     . simvastatin (ZOCOR) 40 MG tablet Take 40 mg by mouth daily.     . sucralfate (CARAFATE) 1 g tablet Take 1 tablet (1 g total) by mouth 3 (three) times daily. Dissolve in 3-4 tbsp warm water, swish and swallow 90 tablet 3  . traMADol (ULTRAM) 50 MG tablet Take 50 mg by mouth 2 (two) times daily as needed for moderate pain.     . clotrimazole-betamethasone (LOTRISONE) cream Apply 1 application topically 2 (two) times daily as needed (rash).     . fluticasone (FLONASE) 50 MCG/ACT nasal spray Place 1 spray into both nostrils daily as needed for allergies or rhinitis.    Marland Kitchen lidocaine-prilocaine (EMLA) cream Apply to affected area once (Patient not taking: Reported on 01/22/2019) 30 g 3  . ondansetron (ZOFRAN) 4 MG tablet Take 1 tablet (4 mg total) by mouth 2 (two) times daily as needed for refractory nausea / vomiting. Start on day 3 after cyclophosphamide chemotherapy. (Patient not taking: Reported on 01/22/2019) 30 tablet 3  . predniSONE (DELTASONE) 20 MG tablet Take 3 tablets (60 mg total) by mouth daily. Take on days 1-5 of chemotherapy. (Patient not taking: Reported on 01/22/2019) 15 tablet 5  . Pseudoephedrine-APAP 30-325 MG TABS Take 1 tablet by mouth daily as needed (sinuses).      No current facility-administered medications for this visit.     Review of Systems  Constitutional: Positive for malaise/fatigue  (improving) and weight loss (1 pound). Negative for chills, diaphoresis and fever.       Doing well.  HENT: Negative.  Negative for congestion, ear discharge, ear pain, hearing loss, nosebleeds, sinus pain and sore throat.   Eyes: Negative.  Negative for blurred vision, double vision, photophobia and pain.  Respiratory: Negative.  Negative for cough, hemoptysis, sputum production and shortness of breath.   Cardiovascular: Positive for leg swelling (chronic BILATERAL ankles). Negative for chest pain, palpitations, orthopnea and PND.  Gastrointestinal: Negative.  Negative for abdominal pain, blood in stool, constipation, diarrhea, heartburn, melena, nausea and vomiting.       Eating well.   Genitourinary: Negative.  Negative for dysuria, frequency, hematuria and urgency.       Recent UTI  Musculoskeletal: Negative.  Negative for  back pain, falls, joint pain, myalgias and neck pain.  Skin: Negative.  Negative for itching and rash.  Neurological: Negative.  Negative for dizziness, tingling, tremors, sensory change, speech change, focal weakness, weakness and headaches.  Endo/Heme/Allergies: Negative.  Does not bruise/bleed easily.  Psychiatric/Behavioral: Negative.  Negative for depression and memory loss. The patient is not nervous/anxious and does not have insomnia.   All other systems reviewed and are negative.  Performance status (ECOG): 1  Blood pressure 127/81, pulse 95, temperature 98.3 F (36.8 C), temperature source Tympanic, resp. rate 18, height _0  (1.499 m), weight 114 lb 15.5 oz (52.2 kg), SpO2 99 %.   Physical Exam  Constitutional: She is oriented to person, place, and time. She appears well-developed and well-nourished. No distress.  HENT:  Head: Normocephalic and atraumatic.  Mouth/Throat: Oropharynx is clear and moist. No oropharyngeal exudate.  Graying hair. Mask.  Eyes: Pupils are equal, round, and reactive to light. Conjunctivae and EOM are normal. No scleral icterus.   Glasses.  Neck: Normal range of motion. Neck supple.  Cardiovascular: Normal rate, regular rhythm and normal heart sounds.  No murmur heard. Pulmonary/Chest: Effort normal and breath sounds normal. No respiratory distress. She has no wheezes.  Abdominal: Soft. Normal appearance and bowel sounds are normal. She exhibits no distension. There is no abdominal tenderness.  Musculoskeletal: Normal range of motion.        General: Edema (chronic, bilateral ankles) present.  Lymphadenopathy:    She has no cervical adenopathy.    She has no axillary adenopathy.       Right: No supraclavicular adenopathy present.       Left: No supraclavicular adenopathy present.  Neurological: She is alert and oriented to person, place, and time.  Skin: Skin is warm and dry. No rash noted. She is not diaphoretic.  Psychiatric: She has a normal mood and affect. Her behavior is normal. Judgment and thought content normal.  Nursing note and vitals reviewed.   Imaging studies: 04/06/2018:  PET scan revealed enlarged and hypermetabolic supraclavicular, mediastinal, abdominal and pelvic adenopathy. Additional smaller hypermetabolic lymph nodes identified within the left cervical lymph node chain. Largest lymph node was a 5.7 cm subcarinal lymph node (SUV 26.78). 07/03/2018:  Chest, abdomen, and pelvic CT revealed marked interval response to therapy. Previously identified relatively bulky lymphadenopathy in the chest, abdomen, and pelvis had resolved with no pathologically enlarged lymphadenopathy.  There was no new or progressive interval findings. 09/25/2018:  PET scan revealed partial response to chemotherapy. The bulky intensely hypermetabolic mediastinal adenopathy was near completely resolved; however a solitary 1.3 cm node remained with intense radiotracer activity (Deauville 4). There was complete metabolic response and size reduction of cervical lymph nodes, retroperitoneal periaortic nodes and pelvic nodes  (Deauville 1). 01/09/2019:  Chest CT without contrast revealed severe bilateral paramediastinal radiation fibrosis changes.There was underlying emphysematous changes and pulmonary scarring. There were no findings suspicious for pulmonary metastatic disease. There was interval decrease in size of the precarinal lymph node when compared to prior study. There was stable advanced atherosclerotic calcifications involving the thoracic and upper abdominal aorta and branch vessels including dense three-vessel coronary artery calcifications.   Appointment on 01/22/2019  Component Date Value Ref Range Status  . Uric Acid, Serum 01/22/2019 5.9  2.5 - 7.1 mg/dL Final   Performed at [facility], [address] [place], [place] [address]  . WBC 01/22/2019 5.7  4.0 - 10.5 K/uL Final  . RBC 01/22/2019 4.13  3.87 - 5.11  MIL/uL Final  . Hemoglobin 01/22/2019 13.6  12.0 - 15.0 g/dL Final  . HCT 01/22/2019 41.9  36.0 - 46.0 % Final  . MCV 01/22/2019 101.5* 80.0 - 100.0 fL Final  . MCH 01/22/2019 32.9  26.0 - 34.0 pg Final  . MCHC 01/22/2019 32.5  30.0 - 36.0 g/dL Final  . RDW 01/22/2019 13.7  11.5 - 15.5 % Final  . Platelets 01/22/2019 170  150 - 400 K/uL Final  . nRBC 01/22/2019 0.0  0.0 - 0.2 % Final  . Neutrophils Relative % 01/22/2019 77  % Final  . Neutro Abs 01/22/2019 4.4  1.7 - 7.7 K/uL Final  . Lymphocytes Relative 01/22/2019 10  % Final  . Lymphs Abs 01/22/2019 0.6* 0.7 - 4.0 K/uL Final  . Monocytes Relative 01/22/2019 10  % Final  . Monocytes Absolute 01/22/2019 0.6  0.1 - 1.0 K/uL Final  . Eosinophils Relative 01/22/2019 1  % Final  . Eosinophils Absolute 01/22/2019 0.1  0.0 - 0.5 K/uL Final  . Basophils Relative 01/22/2019 0  % Final  . Basophils Absolute 01/22/2019 0.0  0.0 - 0.1 K/uL Final  . Immature Granulocytes 01/22/2019 2  % Final  . Abs Immature Granulocytes 01/22/2019 0.09* 0.00 - 0.07 K/uL Final   Performed at [facility], [address]  [place], [place] [address]  . Sodium 01/22/2019 135  135 - 145 mmol/L Final  . Potassium 01/22/2019 3.4* 3.5 - 5.1 mmol/L Final  . Chloride 01/22/2019 102  98 - 111 mmol/L Final  . CO2 01/22/2019 22  22 - 32 mmol/L Final  . Glucose, Bld 01/22/2019 116* 70 - 99 mg/dL Final  . BUN 01/22/2019 28* 8 - 23 mg/dL Final  . Creatinine, Ser 01/22/2019 1.00  0.44 - 1.00 mg/dL Final  . Calcium 01/22/2019 8.5* 8.9 - 10.3 mg/dL Final  . Total Protein 01/22/2019 6.6  6.5 - 8.1 g/dL Final  . Albumin 01/22/2019 4.1  3.5 - 5.0 g/dL Final  . AST 01/22/2019 24  15 - 41 U/L Final  . ALT 01/22/2019 17  0 - 44 U/L Final  . Alkaline Phosphatase 01/22/2019 62  38 - 126 U/L Final  . Total Bilirubin 01/22/2019 0.3  0.3 - 1.2 mg/dL Final  . GFR calc non Af Amer 01/22/2019 57* >60 mL/min Final  . GFR calc Af Amer 01/22/2019 >60  >60 mL/min Final  . Anion gap 01/22/2019 11  5 - 15 Final   Performed at [facility], [address] [place], [place] [address]  . LDH 01/22/2019 285* 98 - 192 U/L Final   Performed at [facility], [address], [place], [place] [address]    Assessment:  [name] is a 73 y.o. 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  IPI is 3 (intermediate-high risk), age-adjusted IPI is 2 (low-intermediate risk), revised IPI is 3 (poor), and NCCN-IPI is 5 (intermediate-high risk).  CNS risk is 3 (intermediate: age, LDH, stage).  EGD on 03/15/2018 revealed pathology from the duodenal scalloped lesion and duodenal sweep ulcer with involvement with diffuse large B cell lymphoma.  Random gastric biopsy was negative for lymphoma.  There was mild chronic gastritis with reactive foveolar hyperplasia.  Gastric fundus scar revealed involvement with diffuse large B cell lymphoma.  GE junction biopsy revealed no lymphoma with squamocolumnar mucosa with mild chronic active inflammation.  The lymphoma has a non-germinal center  immunophenotype with a Ki-67  proliferation index of 60% and shows Bcl-2/CMYC co-expression by  immunohistochemical analysis.   Summary of IHC results, neoplastic cells: Positive: PAX-5, CD20(variable), Bcl-2, MUM1, Bcl-6 (variable), CMYC.  Negative: CD10, CD5, CD30, CD138, Cyclin-D1, ALK-1, CD3.  EBV (ISH): Negative.  FISH for MYC gene rearrangement (8q24 gene region) was negative.  PET scan on 04/06/2018 revealed enlarged and hypermetabolic supraclavicular, mediastinal, abdominal and pelvic adenopathy. Additional smaller hypermetabolic lymph nodes identified within the left cervical lymph node chain. Largest lymph node was a 5.7 cm subcarinal lymph node (SUV 26.78).  Bone marrow aspirate and biopsy on 04/12/2018 revealed a slightly hypercellular bone marrow for age with trilineage hematopoiesis.  There were dyspoietic changes associated with the granulocyte series.  Flow cytometry revealed a predominance of T lymphocytes with relative abundance of CD8 + cells.  There was no significant B cell population.  Cytogenetics are pending.  Echo on 04/04/2018 revealed an EF of 55-60%.  She presented with a normocytic anemia.  She was admitted x 2 (12/2017 and 01/2018) with rectal bleeding.  She is intolerant of oral iron.  Diet is modest.  EGD on 12/30/2017 revealed non bleeding gastric erosions in the antrum.  Colonoscopy on 12/30/2017 revealed numerous polyps (one 5 mm in ascending colon, two 7 mm polyps in transverse colon, five 5-7 mm polyps in the descending colon, one 7 mm polyp in the sigmoid colon, one 8 mm polyp in the recto-sigmoid colon, and one 19 mm polyp in the rectum). 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    She developed hematochezia post colonoscopy.  Flexible sigmoidoscopy on 12/31/2017 revealed a rectal ulcer at the site of the polypectomy and blood in the rectum.  Clips were placed.  EGD on 02/05/2018 revealed 2 bleeding AVMs (treated with APC)  in the small bowel with scalloped/denuded mucosa underneath the bleeding sites.  Work-up on 04/03/2018 revealed a hematocrit of 32.5, hemoglobin 10.7, MCV 91.7, platelets 207,000, WBC 1800 with an ANC of 600.  Uric acid was 8.8.  LDH was 241 (98-192) and beta2-microglobulin  5.8 (0.6-2.4).  Negative studies included:  hepatitis B core antibody total, hepatitis B surface antigen, hepatitis C antibody, HIV testing.     She has required 4 units of PRBCs (3 units in 12/2017 and 1 unit in 01/2018).  Ferritin has been followed: 30 on 12/29/2017, 93 on 02/13/2018, 57 on 03/07/2018, and 38 on 04/03/2018.  Labs on 12/29/2017 revealed the following:  B12 was 254 (low normal).  She has been on oral B12 since 03/07/2018.  B12 was 1524 on 04/03/2018.  Folate 6.3 (> 5.9) on 12/29/2017 and 55.8 on 04/03/2018.    She received 6 cycles of mini-RCHOP (04/24/2018 - 09/10/2018) with growth factor support (Udencya then On-Pro Neulasta).  Cycle #1 platelet nadir was 82,000. Cycle #4 was postponed on 07/13/2018 ([place]well 0).   PET scan on 09/25/2018 revealed partial response to chemotherapy. The bulky intensely hypermetabolic mediastinal adenopathy was near completely resolved; however a solitary 1.3 cm node remained with intense radiotracer activity (Deauville 4). There was complete metabolic response and size reduction of cervical lymph nodes, retroperitoneal periaortic nodes and pelvic nodes (Deauville 1).  She received radiation therapy with 3000 cGy to the mediastinum and 3600 cGy to the hypermetabolic lymph node from 44/81/8563 - 12/04/2018.  Chest CT without contrast on 01/09/2019 revealed severe bilateral paramediastinal radiation fibrosis changes.There was underlying emphysematous changes and pulmonary scarring. There were no findings suspicious for pulmonary metastatic disease. There was interval decrease in size of the precarinal  lymph node when compared to prior study. There was stable advanced atherosclerotic  calcifications involving the thoracic and upper abdominal aorta and branch vessels including dense three-vessel coronary artery calcifications.  She has chronic renal insufficiency.  Creatinine was 1.42 (CrCl 25 ml/min) on 04/03/2018.  She has lupus (diagnosed 1982). She is on prednisone 5 mg a day and Plaquenil.   Symptomatically, she feels "great".  She denies any B symptoms.  She denies any shortness of breath.  Exam reveals no adenopathy or hepatosplenomegaly.  Plan: 1.   Labs today:  CBC with diff, CMP, LDH, uric acid, B12, folate, TSH, free T4, copper, hepatitis B and C serologies. 2.   Stage IVB diffuse large B cell lymphoma Clinically she is doing well. She received 6 cycles of mini R-CHOP (last 08/31/2018). She had a partial response to treatment. She underwent involved field radiation to the mediastinum (completed 12/04/2018). Chest CT on 01/09/2019 was personally reviewed.  Agree with radiology interpretation.    She has radiation fibrosis changes.  She has decreased mediastinal nodes.  Unable to assess metabolic activity. LDH 285 (98-192). Discuss plan for follow-up PET scan   If active disease, plan Rituxan and Revlimid. 3.   History of renal insufficiency Creatinine 1.0. Continue to monitor 4.   Leukopenia, resolved WBC 5700 (Ju[place] 4400). Work-up performed. 5.   B12 deficiency  Continue oral B12.  Folate is 11 today  B12 level pending. 6.   Port flush every 8-12 weeks. 7.   PET scan on 03/20/2019. 8.   RTC after PET scan for MD assessment, labs (CBC with diff, CMP, LDH, uric acid), and review of PET   I discussed the assessment and treatment plan with the patient.  The patient was provided an opportunity to ask questions and all were answered.  The patient agreed with the plan and demonstrated an understanding of the instructions.  The patient was advised to call back if the symptoms worsen or if the condition fails to improve as anticipated.  I provided 25 minutes  of face-to-face time during this this encounter and > 50% was spent counseling as documented under my assessment and plan.    Lequita Asal, MD, PhD    01/22/2019, 11:28 AM  I, Molly Dorshimer, am acting as Education administrator for [employer]. Mike Gip, MD, PhD.  I, Melissa C. Mike Gip, MD, have reviewed the above documentation for accuracy and completeness, and I agree with the above.

## 2019-01-18 ENCOUNTER — Institutional Professional Consult (permissible substitution): Payer: Medicare Other | Admitting: Pulmonary Disease

## 2019-01-18 ENCOUNTER — Other Ambulatory Visit: Payer: Self-pay

## 2019-01-18 ENCOUNTER — Ambulatory Visit: Payer: Medicare Other | Admitting: Pulmonary Disease

## 2019-01-18 ENCOUNTER — Encounter: Payer: Self-pay | Admitting: Pulmonary Disease

## 2019-01-18 VITALS — BP 130/70 | HR 103 | Temp 98.1°F | Ht 59.0 in | Wt 115.0 lb

## 2019-01-18 DIAGNOSIS — J439 Emphysema, unspecified: Secondary | ICD-10-CM | POA: Diagnosis not present

## 2019-01-18 DIAGNOSIS — J8410 Pulmonary fibrosis, unspecified: Secondary | ICD-10-CM | POA: Diagnosis not present

## 2019-01-18 DIAGNOSIS — M329 Systemic lupus erythematosus, unspecified: Secondary | ICD-10-CM

## 2019-01-18 DIAGNOSIS — R0600 Dyspnea, unspecified: Secondary | ICD-10-CM

## 2019-01-18 DIAGNOSIS — I5030 Unspecified diastolic (congestive) heart failure: Secondary | ICD-10-CM | POA: Insufficient documentation

## 2019-01-18 DIAGNOSIS — C8338 Diffuse large B-cell lymphoma, lymph nodes of multiple sites: Secondary | ICD-10-CM

## 2019-01-18 NOTE — Patient Instructions (Signed)
1. We will schedule you for a pulmonary function test to check your breathing.  2. We will schedule a 2D echo of your heart to look at your heart function.  Follow-up in 2 months.

## 2019-01-18 NOTE — Progress Notes (Signed)
Subjective:    Patient ID: Judy Torres, female    DOB: 1946/03/26, 73 y.o.   MRN: [id number]  Please note this patient has a language barrier, she is [place].  Husband is present during the visit to assist with interpretation.  History taking and review of systems is limited due to the language barrier.  HPI Patient is a 73 year old Asian female, former smoker, who presents for evaluation of an abnormal chest x-ray.  She is kindly referred by Faythe Casa, NP.  She is followed by Dr. Nolon Stalls for stage IVb diffuse B-cell lymphoma.  The patient underwent 6 cycles of mini R-CHOP and adjuvant radiation therapy to the chest which completed Judy May 2020.  She has been noted since then to be having issues with "not feeling well" and with dysphagia which developed after her radiation.  She was sent to the symptom management clinic where she was evaluated with a chest x-ray which showed perihilar and medial upper lobe interstitial prominence.  This was followed by a CT scan of the chest which revealed severe bilateral paramediastinal radiation fibrosis changes Judy the setting of emphysematous changes and prior pulmonary scarring.  She has evidence of potential pulmonary hypertension.  There is no evidence of metastatic disease.  Lymphadenopathy was markedly decreased.  The patient also had coronary artery calcifications.  We are asked to render opinion as to these findings, also the initial referral was made for dyspnea, however, the patient does not complain of this per se.  This is corroborated by her husband.  She does feel fatigue and feels "tired" during the day.  She continues to have some issues with dysphagia.  She has had a nonproductive cough which she attributes to postnasal drip which is common for her.  She has not noticed any change Judy the character of her cough.  She has had no hemoptysis.  No purulent sputum production.  She has not had any reflux symptoms.  She has been given Carafate  suspension for management of radiation esophagitis however, she has not been taking it because it upsets her stomach.  There are no other complaints noted.  As noted the patient is a native of [place].  She has been [place] this country for 71 years after marriage.  Her husband was Judy [employer].  The patient is a former smoker having smoked 1/2 pack of cigarettes per day for approximately 20 years she quit around 2003.  Her past medical history, surgical history, family history and remainder of her social history have been reviewed and are as noted.    Review of Systems  Constitutional: Positive for fatigue.  HENT: Positive for postnasal drip, rhinorrhea, sinus pressure, sneezing and trouble swallowing.   Eyes: Negative.   Respiratory: Positive for cough. Negative for chest tightness and shortness of breath.   Cardiovascular: Negative.   Gastrointestinal: Negative.   Endocrine: Negative.   Genitourinary: Negative.   Musculoskeletal: Negative.   Skin: Negative.   Allergic/Immunologic: Negative.   All other systems reviewed and are negative.      Objective:   Physical Exam Vitals signs and nursing note reviewed.  Constitutional:      Appearance: She is ill-appearing (Chronically ill-appearing).     Comments: Somewhat plethoric  HENT:     Head: Normocephalic and atraumatic.     Right Ear: External ear normal.     Left Ear: External ear normal.     Nose:     Comments: Nose/mouth/throat Limited examination due to masking  requirements for COVID-19 Eyes:     General: No scleral icterus.    Conjunctiva/sclera: Conjunctivae normal.     Pupils: Pupils are equal, round, and reactive to light.  Neck:     Musculoskeletal: Neck supple.     Thyroid: No thyromegaly.     Trachea: Trachea and phonation normal.  Cardiovascular:     Rate and Rhythm: Regular rhythm. Tachycardia present.     Pulses: Normal pulses.     Heart sounds: S1 normal and S2 normal. Gallop present. S4 sounds present.     Pulmonary:     Effort: Pulmonary effort is normal.     Breath sounds: Normal breath sounds.  Abdominal:     General: Abdomen is flat. There is no distension.     Palpations: Abdomen is soft.  Musculoskeletal: Normal range of motion.     Right lower leg: No edema.     Left lower leg: No edema.  Lymphadenopathy:     Cervical: No cervical adenopathy.  Skin:    General: Skin is warm and dry.  Neurological:     General: No focal deficit present.     Mental Status: She is alert and oriented to person, place, and time.  Psychiatric:        Mood and Affect: Mood normal.        Behavior: Behavior normal.   CT scan of the chest was reviewed sequentially and independently.  His recent CT scan of 23 June shows extensive fibrotic changes postradiation as noted below:      Assessment & Plan:   1.  Dyspnea/fatigue: She is having difficulty characterizing this symptom.  Appears that she is quitting dyspnea with fatigue.  It appears that her main issue is fatigue.  This would indicate a cardiac etiology for this symptom.  Her physical exam exhibits tachycardia and an S4 gallop.  This is indicative of left ventricular noncompliance and likely worsening of diastolic dysfunction.  Judy addition on the CT scan the patient had evidence of potential pulmonary hypertension.  All of these issues can cause her symptomatology.  She did have a 2D echo performed on the first part of January 2020 however this was before her radiation and chemotherapy.  At this point I would advocate repeating a 2D echo to evaluate these issues.  She may need more aggressive treatment for diastolic dysfunction.    2.  Postinflammatory/postradiation pulmonary fibrosis: Unfortunately this is the byproduct of life-saving radiation to her mediastinum, necessary due to her lymphoma.  She likely developed a severe reaction due to her underlying emphysema.  At present, nothing further to do Judy this regard.  We will obtain pulmonary function  testing to determine residual pulmonary function.  We will see her Judy follow-up Judy 2 months time.  3.  Diastolic heart failure: This is likely chronic.  The echo as noted above.  She may have issues with pulmonary hypertension due to diastolic dysfunction, depending on the results of the echocardiogram she may need evaluation potential nocturnal hypoxemia.  On room air at rest oxygen saturations were 97% today.  4.  Pulmonary emphysema: This is poorly characterized and noted on CT scan of the chest, FEV1 unknown.  Up to this point she has not been symptomatic Judy this regard.  Pulmonary function testing as above.  5.  B-cell lymphoma, stage IVb: This issue adds complexity to her management.  Being managed by oncology.  6.  Personal history of systemic lupus erythematosus: She is on Plaquenil and prednisone  for the same.  Some of her issues with cough and chronic congestion may be related to the Plaquenil.  She does not feel that the symptoms are bothersome enough to discontinue Plaquenil.  This issue also adds complexity to her management.   Thank you for allowing me to participate Judy this patient's care.    This chart was dictated using voice recognition software/Dragon.  Despite best efforts to proofread, errors can occur which can change the meaning.  Any change was purely unintentional.

## 2019-01-20 ENCOUNTER — Other Ambulatory Visit: Payer: Self-pay | Admitting: Hematology and Oncology

## 2019-01-20 DIAGNOSIS — D72819 Decreased white blood cell count, unspecified: Secondary | ICD-10-CM

## 2019-01-22 ENCOUNTER — Encounter: Payer: Self-pay | Admitting: Hematology and Oncology

## 2019-01-22 ENCOUNTER — Inpatient Hospital Stay: Payer: Medicare Other | Attending: Hematology and Oncology

## 2019-01-22 ENCOUNTER — Inpatient Hospital Stay (HOSPITAL_BASED_OUTPATIENT_CLINIC_OR_DEPARTMENT_OTHER): Payer: Medicare Other | Admitting: Hematology and Oncology

## 2019-01-22 ENCOUNTER — Other Ambulatory Visit: Payer: Self-pay

## 2019-01-22 VITALS — BP 127/81 | HR 95 | Temp 98.3°F | Resp 18 | Ht 59.0 in | Wt 115.0 lb

## 2019-01-22 DIAGNOSIS — R5383 Other fatigue: Secondary | ICD-10-CM

## 2019-01-22 DIAGNOSIS — R531 Weakness: Secondary | ICD-10-CM

## 2019-01-22 DIAGNOSIS — Z923 Personal history of irradiation: Secondary | ICD-10-CM | POA: Diagnosis not present

## 2019-01-22 DIAGNOSIS — Z951 Presence of aortocoronary bypass graft: Secondary | ICD-10-CM | POA: Insufficient documentation

## 2019-01-22 DIAGNOSIS — M3214 Glomerular disease in systemic lupus erythematosus: Secondary | ICD-10-CM | POA: Insufficient documentation

## 2019-01-22 DIAGNOSIS — E860 Dehydration: Secondary | ICD-10-CM

## 2019-01-22 DIAGNOSIS — Z87891 Personal history of nicotine dependence: Secondary | ICD-10-CM | POA: Insufficient documentation

## 2019-01-22 DIAGNOSIS — C8339 Diffuse large B-cell lymphoma, extranodal and solid organ sites: Secondary | ICD-10-CM | POA: Insufficient documentation

## 2019-01-22 DIAGNOSIS — R5381 Other malaise: Secondary | ICD-10-CM | POA: Diagnosis not present

## 2019-01-22 DIAGNOSIS — I129 Hypertensive chronic kidney disease with stage 1 through stage 4 chronic kidney disease, or unspecified chronic kidney disease: Secondary | ICD-10-CM

## 2019-01-22 DIAGNOSIS — E785 Hyperlipidemia, unspecified: Secondary | ICD-10-CM | POA: Diagnosis not present

## 2019-01-22 DIAGNOSIS — D72819 Decreased white blood cell count, unspecified: Secondary | ICD-10-CM

## 2019-01-22 DIAGNOSIS — E538 Deficiency of other specified B group vitamins: Secondary | ICD-10-CM | POA: Diagnosis not present

## 2019-01-22 DIAGNOSIS — Z79899 Other long term (current) drug therapy: Secondary | ICD-10-CM | POA: Diagnosis not present

## 2019-01-22 DIAGNOSIS — N189 Chronic kidney disease, unspecified: Secondary | ICD-10-CM | POA: Insufficient documentation

## 2019-01-22 DIAGNOSIS — Z7189 Other specified counseling: Secondary | ICD-10-CM

## 2019-01-22 DIAGNOSIS — Z87440 Personal history of urinary (tract) infections: Secondary | ICD-10-CM

## 2019-01-22 DIAGNOSIS — K219 Gastro-esophageal reflux disease without esophagitis: Secondary | ICD-10-CM | POA: Diagnosis not present

## 2019-01-22 LAB — COMPREHENSIVE METABOLIC PANEL
ALT: 17 U/L (ref 0–44)
AST: 24 U/L (ref 15–41)
Albumin: 4.1 g/dL (ref 3.5–5.0)
Alkaline Phosphatase: 62 U/L (ref 38–126)
Anion gap: 11 (ref 5–15)
BUN: 28 mg/dL — ABNORMAL HIGH (ref 8–23)
CO2: 22 mmol/L (ref 22–32)
Calcium: 8.5 mg/dL — ABNORMAL LOW (ref 8.9–10.3)
Chloride: 102 mmol/L (ref 98–111)
Creatinine, Ser: 1 mg/dL (ref 0.44–1.00)
GFR calc Af Amer: >60 mL/min (ref >60)
GFR calc non Af Amer: 57 mL/min — ABNORMAL LOW (ref >60)
Glucose, Bld: 116 mg/dL — ABNORMAL HIGH (ref 70–99)
Potassium: 3.4 mmol/L — ABNORMAL LOW (ref 3.5–5.1)
Sodium: 135 mmol/L (ref 135–145)
Total Bilirubin: 0.3 mg/dL (ref 0.3–1.2)
Total Protein: 6.6 g/dL (ref 6.5–8.1)

## 2019-01-22 LAB — FOLATE: Folate: 11 ng/mL (ref >5.9)

## 2019-01-22 LAB — CBC WITH DIFFERENTIAL/PLATELET
Abs Immature Granulocytes: 0.09 10*3/uL — ABNORMAL HIGH (ref 0.00–0.07)
Basophils Absolute: 0 10*3/uL (ref 0.0–0.1)
Basophils Relative: 0 %
Eosinophils Absolute: 0.1 10*3/uL (ref 0.0–0.5)
Eosinophils Relative: 1 %
HCT: 41.9 % (ref 36.0–46.0)
Hemoglobin: 13.6 g/dL (ref 12.0–15.0)
Immature Granulocytes: 2 %
Lymphocytes Relative: 10 %
Lymphs Abs: 0.6 10*3/uL — ABNORMAL LOW (ref 0.7–4.0)
MCH: 32.9 pg (ref 26.0–34.0)
MCHC: 32.5 g/dL (ref 30.0–36.0)
MCV: 101.5 fL — ABNORMAL HIGH (ref 80.0–100.0)
Monocytes Absolute: 0.6 10*3/uL (ref 0.1–1.0)
Monocytes Relative: 10 %
Neutro Abs: 4.4 10*3/uL (ref 1.7–7.7)
Neutrophils Relative %: 77 %
Platelets: 170 10*3/uL (ref 150–400)
RBC: 4.13 MIL/uL (ref 3.87–5.11)
RDW: 13.7 % (ref 11.5–15.5)
WBC: 5.7 10*3/uL (ref 4.0–10.5)
nRBC: 0 % (ref 0.0–0.2)

## 2019-01-22 LAB — URIC ACID: Uric Acid, Serum: 5.9 mg/dL (ref 2.5–7.1)

## 2019-01-22 LAB — T4, FREE: Free T4: 0.72 ng/dL (ref 0.61–1.12)

## 2019-01-22 LAB — TSH: TSH: 2.483 u[IU]/mL (ref 0.350–4.500)

## 2019-01-22 LAB — LACTATE DEHYDROGENASE: LDH: 285 U/L — ABNORMAL HIGH (ref 98–192)

## 2019-01-22 NOTE — Progress Notes (Signed)
No new changes noted today 

## 2019-01-23 LAB — HEPATITIS C ANTIBODY: HCV Ab: <0.1 s/co ratio (ref 0.0–0.9)

## 2019-01-23 LAB — HEPATITIS B CORE ANTIBODY, TOTAL: Hep B Core Total Ab: NEGATIVE

## 2019-01-24 LAB — COPPER, SERUM: Copper: 105 ug/dL (ref 72–166)

## 2019-01-25 LAB — MISC LABCORP TEST (SEND OUT): Labcorp test code: 1503

## 2019-02-04 ENCOUNTER — Other Ambulatory Visit: Payer: Self-pay | Admitting: Radiation Oncology

## 2019-03-05 DIAGNOSIS — I110 Hypertensive heart disease with heart failure: Secondary | ICD-10-CM | POA: Insufficient documentation

## 2019-03-13 ENCOUNTER — Other Ambulatory Visit: Payer: Self-pay

## 2019-03-14 ENCOUNTER — Inpatient Hospital Stay: Payer: Medicare Other

## 2019-03-14 ENCOUNTER — Other Ambulatory Visit: Payer: Self-pay

## 2019-03-19 ENCOUNTER — Encounter: Payer: Self-pay | Admitting: Radiology

## 2019-03-20 ENCOUNTER — Other Ambulatory Visit: Payer: Self-pay

## 2019-03-20 ENCOUNTER — Ambulatory Visit
Admission: RE | Admit: 2019-03-20 | Discharge: 2019-03-20 | Disposition: A | Discharge status: Home / Self Care | Payer: Medicare Other | Source: Ambulatory Visit | Attending: Hematology and Oncology | Admitting: Hematology and Oncology

## 2019-03-20 ENCOUNTER — Other Ambulatory Visit: Payer: Medicare Other

## 2019-03-20 DIAGNOSIS — J432 Centrilobular emphysema: Secondary | ICD-10-CM | POA: Diagnosis not present

## 2019-03-20 DIAGNOSIS — C8339 Diffuse large B-cell lymphoma, extranodal and solid organ sites: Secondary | ICD-10-CM

## 2019-03-20 LAB — GLUCOSE, CAPILLARY: Glucose-Capillary: 90 mg/dL (ref 70–99)

## 2019-03-20 MED ORDER — FLUDEOXYGLUCOSE F - 18 (FDG) INJECTION
5.9000 | Freq: Once | INTRAVENOUS | Status: AC | PRN
  Administered 2019-03-20: 6.44 via INTRAVENOUS

## 2019-03-20 NOTE — Progress Notes (Signed)
[facility]  [address] [place], [place] [address] Phone: [phone]  Fax: [phone]   Clinic Day:  03/22/2019  Referring physician: [name], MD  Chief Complaint: Judy Torres Torres is a 73 y.o. 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  HPI: The patient was last seen Judy Torres the medical oncology clinic on 01/22/2019. At that time, she felt "great".  She denied any B symptoms. She denied any shortness of breath. Exam revealed no adenopathy or hepatosplenomegaly. Hematocrit 41.9, hemoglobin 13.6, platelets 170,000, WBC 5700 (ANC 4400). Potassium 3.4, BUN 28, creatinine 1.0, calcium 8.5. Vitamin B-12 >2000. Folate was 11. Free T4 0.72. Copper 105. TSH 2.483. LDH 285. Hepatitis C antibody and Hepatitis B core antibody were negative. Uric acid 5.9.   PET scan on 03/20/2019 revealed further improvement Judy Torres hypermetabolic activity associated with a small residual pre-carinal lymph node which had decreased Judy Torres size compared with the prior PET-CT of 6 months ago (still Deauville 4).  There was no other residual or recurrent hypermetabolic nodal activity Judy Torres the neck, chest, abdomen or pelvis (Deauville 1). There was resolving radiation changes Judy Torres both lungs. There was stable extensive atherosclerosis and moderate centrilobular emphysema.   During the interim, the patient felt "ok". She describes improving shortness of breath from radiation. She notes leg swelling Judy Torres bilateral ankles. She reports a good diet. She eats meat 0-2 times a week; she consumes green leafy vegetables with every meal. She denies ice pica. She is taking oral B-12 and half an iron pill daily with orange juice or vitamin C.  She was seen by Dr. Jefm Bryant on 03/13/2019 for follow-up of her lupus.  Notes reviewed.  She was to continue Plaquenil and prednisone.  She has a follow-up Judy Torres 6 months.   Past Medical History:    Diagnosis Date   Cancer [facility])    Stomach cancer   Chronic kidney disease    GERD (gastroesophageal reflux disease)    HTN (hypertension)    Hyperlipidemia    Lupus (HCC)    S/P angioplasty with stent     Past Surgical History:  Procedure Laterality Date   CARDIAC SURGERY     CESAREAN SECTION     COLONOSCOPY N/A 12/30/2017   Procedure: COLONOSCOPY;  Surgeon: Lin Landsman, MD;  Location: [facility] ENDOSCOPY;  Service: Gastroenterology;  Laterality: N/A;   CORONARY ARTERY BYPASS GRAFT  2003   ESOPHAGOGASTRODUODENOSCOPY N/A 12/30/2017   Procedure: ESOPHAGOGASTRODUODENOSCOPY (EGD);  Surgeon: Lin Landsman, MD;  Location: [facility] ENDOSCOPY;  Service: Gastroenterology;  Laterality: N/A;   ESOPHAGOGASTRODUODENOSCOPY Left 02/05/2018   Procedure: ESOPHAGOGASTRODUODENOSCOPY (EGD);  Surgeon: Virgel Manifold, MD;  Location[facility] ENDOSCOPY;  Service: Endoscopy;  Laterality: Left;   ESOPHAGOGASTRODUODENOSCOPY (EGD) WITH PROPOFOL N/A 03/15/2018   Procedure: ESOPHAGOGASTRODUODENOSCOPY (EGD) WITH PROPOFOL;  Surgeon: Virgel Manifold, MD;  Location: [facility] ENDOSCOPY;  Service: Endoscopy;  Laterality: N/A;   FLEXIBLE SIGMOIDOSCOPY N/A 12/31/2017   Procedure: FLEXIBLE SIGMOIDOSCOPY;  Surgeon: Jonathon Bellows, MD;  Location: [facility] ENDOSCOPY;  Service: Gastroenterology;  Laterality: N/A;   PORTA CATH INSERTION N/A 04/19/2018   Procedure: PORTA CATH INSERTION;  Surgeon: Algernon Huxley, MD;  Location: [facility];  Service: Cardiovascular;  Laterality: N/A;    Family History  Problem Relation Age of Onset   Hypertension Mother    Stroke Mother    Vascular Disease Mother    Hypertension Sister  Social History:  reports that she quit smoking about 17 years ago. Her smoking use included cigarettes. She has a 10.00 pack-year smoking history. She has never used smokeless tobacco. She reports previous alcohol use. She reports that she does not use drugs. She has smoked 1/2  pack/day x 20 years. She stopped smoking Judy Torres 2000. She denies any exposure to radiation or toxins. She is from [place]. She moved to the [place] 52 years ago after marrying her husband. She was Judy Torres the [employer]. The patient is alone today. Her husband phone [phone]).   Allergies:  Allergies  Allergen Reactions   Penicillins Other (See Comments)    Pass out Has patient had a PCN reaction causing immediate rash, facial/tongue/throat swelling, SOB or lightheadedness with hypotension: Yes Has patient had a PCN reaction causing severe rash involving mucus membranes or skin necrosis: No Has patient had a PCN reaction that required hospitalization: No Has patient had a PCN reaction occurring within the last 10 years: No If all of the above answers are "NO", then may proceed with Cephalosporin use.    Sulfa Antibiotics Nausea Only   Sulfasalazine Nausea And Vomiting        Ace Inhibitors Nausea Only   Losartan Anxiety   Spironolactone Other (See Comments)    Felt poorly but couldn't clarify     Current Medications: Current Outpatient Medications  Medication Sig Dispense Refill   alendronate (FOSAMAX) 70 MG tablet Take 70 mg by mouth every Saturday.      allopurinol (ZYLOPRIM) 100 MG tablet TAKE 1/2 TABLET BY MOUTH EVERY DAY 45 tablet 1   amLODipine (NORVASC) 10 MG tablet Take 10 mg by mouth daily.     ferrous sulfate 325 (65 FE) MG tablet Take 1 tablet (325 mg total) by mouth 2 (two) times daily with a meal. 60 tablet 3   hydroxychloroquine (PLAQUENIL) 200 MG tablet Take 200 mg by mouth 2 (two) times daily.      Multiple Vitamin (MULTIVITAMIN WITH MINERALS) TABS tablet Take 1 tablet by mouth daily. 180 tablet 0   polyethylene glycol (MIRALAX / GLYCOLAX) packet Take 17 g by mouth daily.     predniSONE (DELTASONE) 5 MG tablet Take 5 mg by mouth daily with breakfast.      simvastatin (ZOCOR) 40 MG tablet Take 40 mg by mouth daily.      sucralfate (CARAFATE) 1 g  tablet TAKE 1 TABLET BY MOUTH 3 (THREE) TIMES DAILY. DISSOLVE Judy Torres 3-4 TBSP WARM WATER, SWISH AND SWALLOW 270 tablet 2   traMADol (ULTRAM) 50 MG tablet Take 50 mg by mouth 2 (two) times daily as needed for moderate pain.      acetaminophen (TYLENOL) 500 MG tablet Take 1,000 mg by mouth 2 (two) times daily as needed for moderate pain or headache.     clotrimazole-betamethasone (LOTRISONE) cream Apply 1 application topically 2 (two) times daily as needed (rash).      dexamethasone (DECADRON) 4 MG tablet Take 1 tablet (4 mg total) by mouth daily. (Patient not taking: Reported on 03/22/2019) 30 tablet 0   feeding supplement, ENSURE ENLIVE, (ENSURE ENLIVE) LIQD Take 237 mLs by mouth 2 (two) times daily between meals. (Patient not taking: Reported on 03/22/2019) 90 Bottle 0   fluticasone (FLONASE) 50 MCG/ACT nasal spray Place 1 spray into both nostrils daily as needed for allergies or rhinitis.     lidocaine-prilocaine (EMLA) cream Apply to affected area once (Patient not taking: Reported on 01/22/2019) 30 g 3   ondansetron (  ZOFRAN) 4 MG tablet Take 1 tablet (4 mg total) by mouth 2 (two) times daily as needed for refractory nausea / vomiting. Start on day 3 after cyclophosphamide chemotherapy. (Patient not taking: Reported on 01/22/2019) 30 tablet 3   predniSONE (DELTASONE) 20 MG tablet Take 3 tablets (60 mg total) by mouth daily. Take on days 1-5 of chemotherapy. (Patient not taking: Reported on 01/22/2019) 15 tablet 5   Pseudoephedrine-APAP 30-325 MG TABS Take 1 tablet by mouth daily as needed (sinuses).      ranitidine (ZANTAC) 150 MG tablet Take 150 mg by mouth daily as needed for heartburn.      No current facility-administered medications for this visit.    Facility-Administered Medications Ordered Judy Torres Other Visits  Medication Dose Route Frequency Provider Last Rate Last Dose   sodium chloride flush (NS) 0.9 % injection 10 mL  10 mL Intravenous PRN Nolon Stalls C, MD   10 mL at 03/22/19 1449     Review of Systems  Constitutional: Negative for chills, diaphoresis, fever, malaise/fatigue (improved) and weight loss (stable).       Feels "ok".  HENT: Negative.  Negative for congestion, ear discharge, ear pain, hearing loss, nosebleeds, sinus pain and sore throat.   Eyes: Negative.  Negative for blurred vision, double vision, photophobia and pain.  Respiratory: Positive for shortness of breath (from radiation, improving). Negative for cough, hemoptysis and sputum production.   Cardiovascular: Positive for leg swelling (chronic BILATERAL ankles). Negative for chest pain, palpitations, orthopnea and PND.  Gastrointestinal: Negative.  Negative for abdominal pain, blood Judy Torres stool, constipation, diarrhea, heartburn, melena, nausea and vomiting.       Good diet.  Genitourinary: Negative.  Negative for dysuria, frequency, hematuria and urgency.  Musculoskeletal: Negative.  Negative for back pain, falls, joint pain, myalgias and neck pain.  Skin: Negative.  Negative for itching and rash.  Neurological: Negative.  Negative for dizziness, tingling, tremors, sensory change, speech change, focal weakness, weakness and headaches.  Endo/Heme/Allergies: Negative.  Does not bruise/bleed easily.  Psychiatric/Behavioral: Negative.  Negative for depression and memory loss. The patient is not nervous/anxious and does not have insomnia.   All other systems reviewed and are negative.  Performance status (ECOG): 1  Vitals Blood pressure 132/72, pulse 88, temperature 98.6 F (37 C), temperature source Tympanic, resp. rate 18, height 4' 11" (1.499 m), weight 114 lb 3.2 oz (51.8 kg), SpO2 100 %.  Physical Exam  Constitutional: She is oriented to person, place, and time. She appears well-developed and well-nourished. No distress.  HENT:  Head: Normocephalic and atraumatic.  Mouth/Throat: Oropharynx is clear and moist. No oropharyngeal exudate.  Short graying hair. Mask.  Eyes: Pupils are equal, round, and  reactive to light. Conjunctivae and EOM are normal. No scleral icterus.  Glasses.  Neck: Normal range of motion. Neck supple. No JVD present.  Cardiovascular: Normal rate, regular rhythm and normal heart sounds.  No murmur heard. Pulmonary/Chest: Effort normal and breath sounds normal. No respiratory distress. She has no wheezes. She has no rales.  Abdominal: Soft. Normal appearance and bowel sounds are normal. She exhibits no distension and no mass. There is no abdominal tenderness. There is no rebound and no guarding.  Musculoskeletal: Normal range of motion.        General: Edema (2+ sock height bilateral lower extremity,  L > R) present.  Lymphadenopathy:    She has no cervical adenopathy.    She has no axillary adenopathy.       Right: No  supraclavicular adenopathy present.       Left: No supraclavicular adenopathy present.  Neurological: She is alert and oriented to person, place, and time.  Skin: Skin is warm and dry. No rash noted. She is not diaphoretic.  Psychiatric: She has a normal mood and affect. Her behavior is normal. Judgment and thought content normal.  Nursing note and vitals reviewed.   Imaging studies: 04/06/2018:  PET scanrevealed enlarged and hypermetabolic supraclavicular, mediastinal, abdominal and pelvic adenopathy. Additional smaller hypermetabolic lymph nodes identified within the left cervical lymph node chain. Largest lymph node was a 5.7 cm subcarinal lymph node (SUV 26.78). 07/03/2018:  Chest, abdomen, and pelvic CTrevealed marked interval response to therapy. Previously identified relatively bulky lymphadenopathy Judy Torres the chest, abdomen, and pelvis had resolved with no pathologically enlarged lymphadenopathy. There was no new or progressive interval findings. 09/25/2018:  PET scan revealed partial response to chemotherapy. The bulky intensely hypermetabolic mediastinal adenopathy was near completely resolved; however a solitary 1.3 cm node remained with  intense radiotracer activity (Deauville 4). There was complete metabolic response and size reduction of cervical lymph nodes, retroperitoneal periaortic nodes and pelvic nodes (Deauville 1). 01/09/2019:  Chest CT without contrast revealed severe bilateral paramediastinal radiation fibrosis changes.There was underlying emphysematous changes and pulmonary scarring. There were no findings suspicious for pulmonary metastatic disease. There was interval decrease Judy Torres size of the precarinal lymph node when compared to prior study. There was stable advanced atherosclerotic calcifications involving the thoracic and upper abdominal aorta and branch vessels including dense three-vessel coronary artery calcifications. 03/20/2019:  PET scan revealed further improvement Judy Torres hypermetabolic activity associated with a small residual pre-carinal lymph node which had decreased Judy Torres size compared with the prior PET-CT of 6 months ago (still Deauville 4).  There was no other residual or recurrent hypermetabolic nodal activity Judy Torres the neck, chest, abdomen or pelvis (Deauville 1). There was resolving radiation changes Judy Torres both lungs. There was stable extensive atherosclerosis and moderate centrilobular emphysema.    Infusion on 03/22/2019  Component Date Value Ref Range Status   Uric Acid, Serum 03/22/2019 5.8  2.5 - 7.1 mg/dL Final   Performed at [facility], [address] [place], [place] [address]   LDH 03/22/2019 247* 98 - 192 U/L Final   Performed at [facility] [address] [place], [place] [address]   Sodium 03/22/2019 138  135 - 145 mmol/L Final   Potassium 03/22/2019 3.7  3.5 - 5.1 mmol/L Final   Chloride 03/22/2019 103  98 - 111 mmol/L Final   CO2 03/22/2019 25  22 - 32 mmol/L Final   Glucose, Bld 03/22/2019 127* 70 - 99 mg/dL Final   BUN 03/22/2019 25* 8 - 23 mg/dL Final   Creatinine, Ser 03/22/2019 1.20* 0.44 - 1.00 mg/dL Final   Calcium 03/22/2019 9.1  8.9 - 10.3 mg/dL  Final   Total Protein 03/22/2019 6.2* 6.5 - 8.1 g/dL Final   Albumin 03/22/2019 3.7  3.5 - 5.0 g/dL Final   AST 03/22/2019 22  15 - 41 U/L Final   ALT 03/22/2019 13  0 - 44 U/L Final   Alkaline Phosphatase 03/22/2019 64  38 - 126 U/L Final   Total Bilirubin 03/22/2019 0.2* 0.3 - 1.2 mg/dL Final   GFR calc non Af Amer 03/22/2019 45* >60 mL/min Final   GFR calc Af Amer 03/22/2019 53* >60 mL/min Final   Anion gap 03/22/2019 10  5 - 15 Final   Performed at [facility],  [address], [place], [place] [address]   WBC 03/22/2019 8.2  4.0 - 10.5 K/uL Final   RBC 03/22/2019 3.51* 3.87 - 5.11 MIL/uL Final   Hemoglobin 03/22/2019 11.1* 12.0 - 15.0 g/dL Final   HCT 03/22/2019 34.4* 36.0 - 46.0 % Final   MCV 03/22/2019 98.0  80.0 - 100.0 fL Final   MCH 03/22/2019 31.6  26.0 - 34.0 pg Final   MCHC 03/22/2019 32.3  30.0 - 36.0 g/dL Final   RDW 03/22/2019 14.4  11.5 - 15.5 % Final   Platelets 03/22/2019 150  150 - 400 K/uL Final   nRBC 03/22/2019 0.0  0.0 - 0.2 % Final   Neutrophils Relative % 03/22/2019 61  % Final   Neutro Abs 03/22/2019 5.1  1.7 - 7.7 K/uL Final   Lymphocytes Relative 03/22/2019 22  % Final   Lymphs Abs 03/22/2019 1.8  0.7 - 4.0 K/uL Final   Monocytes Relative 03/22/2019 5  % Final   Monocytes Absolute 03/22/2019 0.4  0.1 - 1.0 K/uL Final   Eosinophils Relative 03/22/2019 10  % Final   Eosinophils Absolute 03/22/2019 0.8* 0.0 - 0.5 K/uL Final   Basophils Relative 03/22/2019 1  % Final   Basophils Absolute 03/22/2019 0.0  0.0 - 0.1 K/uL Final   Immature Granulocytes 03/22/2019 1  % Final   Abs Immature Granulocytes 03/22/2019 0.04  0.00 - 0.07 K/uL Final   Performed at [facility] [address], [place], [place] [address]  Hospital Outpatient Visit on 03/20/2019  Component Date Value Ref Range Status   Glucose-Capillary 03/20/2019 90  70 - 99 mg/dL Final    Assessment:  Judy Torres Judy Torres Torres is a 73 y.o. female with  stage IVBdiffuse large B cell lymphomainvolving the gastric fundus and duodenum. IPI is 3 (intermediate-high risk), age-adjusted IPI is 2 (low-intermediate risk), revised IPI is 3 (poor), and NCCN-IPI is 5 (intermediate-high risk). CNS risk is 3 (intermediate: age, LDH, stage).  EGDon 03/15/2018 revealed pathology from the duodenal scalloped lesion and duodenal sweep ulcerwith involvement with diffuse large B cell lymphoma. Random gastric biopsy was negative for lymphoma. There was mild chronic gastritis with reactive foveolar hyperplasia. Gastric fundus scarrevealed involvement with diffuse large B cell lymphoma. GE junction biopsy revealed no lymphoma with squamocolumnar mucosa with mild chronic active inflammation.  The lymphoma has a non-germinal center immunophenotype with a Ki-67 proliferation index of 60% and shows Bcl-2/CMYC co-expression by immunohistochemical analysis. Summary of IHC results, neoplastic cells: Positive: PAX-5, CD20(variable), Bcl-2, MUM1, Bcl-6 (variable), CMYC. Negative: CD10, CD5, CD30, CD138, Cyclin-D1, ALK-1, CD3. EBV (ISH): Negative. FISH for MYCgene rearrangement (8q24 gene region) was negative.  PET scanon 04/06/2018 revealed enlarged and hypermetabolic supraclavicular, mediastinal, abdominal and pelvic adenopathy. Additional smaller hypermetabolic lymph nodes identified within the left cervical lymph node chain. Largest lymph node was a 5.7 cm subcarinal lymph node (SUV 26.78).  Bone marrow aspirate and biopsyon 04/12/2018 revealed a slightly hypercellular bone marrow for age with trilineage hematopoiesis. There were dyspoietic changes associated with the granulocyte series. Flow cytometry revealed a predominance of T lymphocytes with relative abundance of CD8 + cells. There was no significant B cell population. Cytogenetics are pending.  Echoon 04/04/2018 revealed an EF of 55-60%.  She presented with a normocytic anemia. She was admitted  x 2 (12/2017 and 01/2018) with rectal bleeding. She is intolerant of oral iron. Dietis modest.  EGDon 12/30/2017 revealed non bleeding gastric erosionsin the antrum. Colonoscopyon 12/30/2017 revealed numerous polyps(one 5 mm Judy Torres ascending colon, two 7  mm polyps Judy Torres transverse colon, five 5-7 mm polyps Judy Torres the descending colon, one 7 mm polyp Judy Torres the sigmoid colon, one 8 mm polyp Judy Torres the recto-sigmoid colon, and one 19 mm polyp Judy Torres the rectum). 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   She developed hematochezia post colonoscopy. Flexible sigmoidoscopyon 12/31/2017 revealed a rectal ulcer at the site of the polypectomy and blood Judy Torres the rectum. Clips were placed. EGDon 02/05/2018 revealed 2 bleeding AVMs (treated with APC) Judy Torres the small bowel with scalloped/denuded mucosa underneath the bleeding sites.  Work-up on 09/16/2019revealed a hematocrit of 32.5, hemoglobin 10.7, MCV 91.7, platelets 207,000, WBC 1800 with an ANC of 600. Uric acid was 8.8. LDH was 241 (98-192) and beta2-microglobulin 5.8 (0.6-2.4). Negative studiesincluded: hepatitis B core antibody total, hepatitis B surface antigen, hepatitis C antibody, HIV testing.   She has required 4 units of PRBCs(3 units Judy Torres 12/2017 and 1 unit Judy Torres 01/2018).  Ferritinhas been followed: 30 on 12/29/2017, 93 on 02/13/2018, 57 on 03/07/2018, and 38 on 04/03/2018.  Labs on 12/29/2017 revealed the following: B12was 254 (low normal). She has been on oral B12since 03/07/2018. B12 was 1524 on 04/03/2018. Folate6.3 (>5.9) on 12/29/2017 and 55.8 on 04/03/2018.   She received6cycles of mini-RCHOP(04/24/2018 - 09/10/2018) withgrowth factor support ([place] On-Pro Neulasta). Cycle #1 platelet nadir was 82,000. Cycle #4 was postponed on 07/13/2018 ([place] 0).   PET scan on 09/25/2018 revealed partial response to chemotherapy. The bulky intensely hypermetabolic mediastinal adenopathy was near  completely resolved; however a solitary 1.3 cm node remained with intense radiotracer activity (Deauville 4). There was complete metabolic response and size reduction of cervical lymph nodes, retroperitoneal periaortic nodes and pelvic nodes (Deauville 1).  She received radiation therapy with 3000 cGy to the mediastinum and 3600 cGy to the hypermetabolic lymph node from 15/11/6977 - 12/04/2018.  Chest CT without contrast on 01/09/2019 revealed severe bilateral paramediastinal radiation fibrosis changes.There was underlying emphysematous changes and pulmonary scarring. There were no findings suspicious for pulmonary metastatic disease. There was interval decrease Judy Torres size of the precarinal lymph node when compared to prior study. There was stable advanced atherosclerotic calcifications involving the thoracic and upper abdominal aorta and branch vessels including dense three-vessel coronary artery calcifications.  PET scan on 03/20/2019 revealed further improvement Judy Torres hypermetabolic activity associated with a small residual pre-carinal lymph node which had decreased Judy Torres size compared with the prior PET-CT of 6 months ago (still Deauville 4).  There was no other residual or recurrent hypermetabolic nodal activity Judy Torres the neck, chest, abdomen or pelvis (Deauville 1). There was resolving radiation changes Judy Torres both lungs. There was stable extensive atherosclerosis and moderate centrilobular emphysema.   She has chronicrenal insufficiency. Creatinine was 1.42 (CrCl 25 ml/min) on 04/03/2018.  She haslupus(diagnosed 1982). She is on prednisone 5 mg a day and Plaquenil.   Symptomatically, she notes improving shortness of breath s/p radiation.  Exam reveals no adenopathy or hepatosplenomegaly.  She has bilateral 2+ lower extremity edema (left > right).  Plan: 1.   Labs today: CBC with diff, CMP, LDH, uric acid. 2.   Stage IVB diffuse large B cell lymphoma Clinically, she is doing well. She received 6  cycles of mini R-CHOP (last 08/31/2018). She had a partial response to treatment. She underwent involved field radiation to the mediastinum (completed 12/04/2018). PET on 03/20/2019 was personally reviewed.  Agree with radiology interpretation.    She has ongoing improvement Judy Torres the mediastinal lymph node hypermetabolic activity.  She has no evidence of recurrent  disease Judy Torres the neck chest abdomen or pelvis.  She has evolving radiation changes. Continue to monitor. If active disease, plan Rituxan and Revlimid. 3.   History of renal insufficiency Creatinine 1.20. Continue to monitor. 4.   Mild normocytic anemia  Hematocrit 34.4.  Hemoglobin 11.1.  MCV 98.  Ferritin 93 with an iron saturation of 15% and a TIBC of 273.  No evidence of hemolysis.   Haptoglobin normal.  Reticulocyte count 2.1%.   LDH slightly elevated at 247 (98-192). 5.   Lower extremity edema  Bilateral lower extremity duplex to r/o DVT. 6.   Port flush every 6-8 weeks. 7.   RTC Judy Torres 1 month for labs (CBC +/- others). 8.   RTC Judy Torres 3 months for MD assessment, labs (CBC with diff, CMP, LDH, uric acid, ferritin), and port flush.  I discussed the assessment and treatment plan with the patient.  The patient was provided an opportunity to ask questions and all were answered.  The patient agreed with the plan and demonstrated an understanding of the instructions.  The patient was advised to call back if the symptoms worsen or if the condition fails to improve as anticipated.   Lequita Asal, MD, PhD    03/22/2019, 3:32 PM  I, Selena Batten, am acting as scribe for [employer]. Mike Gip, MD, PhD.  I, Ariadna Setter C. Mike Gip, MD, have reviewed the above documentation for accuracy and completeness, and I agree with the above.

## 2019-03-21 ENCOUNTER — Other Ambulatory Visit: Payer: Self-pay

## 2019-03-22 ENCOUNTER — Other Ambulatory Visit: Payer: Self-pay

## 2019-03-22 ENCOUNTER — Encounter: Payer: Self-pay | Admitting: Hematology and Oncology

## 2019-03-22 ENCOUNTER — Other Ambulatory Visit: Payer: Medicare Other

## 2019-03-22 ENCOUNTER — Inpatient Hospital Stay: Payer: Medicare Other

## 2019-03-22 ENCOUNTER — Inpatient Hospital Stay: Payer: Medicare Other | Attending: Hematology and Oncology | Admitting: Hematology and Oncology

## 2019-03-22 ENCOUNTER — Ambulatory Visit
Admission: RE | Admit: 2019-03-22 | Discharge: 2019-03-22 | Disposition: A | Discharge status: Home / Self Care | Payer: Medicare Other | Source: Ambulatory Visit | Attending: Hematology and Oncology | Admitting: Hematology and Oncology

## 2019-03-22 VITALS — BP 132/72 | HR 88 | Temp 98.6°F | Resp 18 | Ht 59.0 in | Wt 114.2 lb

## 2019-03-22 DIAGNOSIS — D72819 Decreased white blood cell count, unspecified: Secondary | ICD-10-CM

## 2019-03-22 DIAGNOSIS — N189 Chronic kidney disease, unspecified: Secondary | ICD-10-CM | POA: Insufficient documentation

## 2019-03-22 DIAGNOSIS — E538 Deficiency of other specified B group vitamins: Secondary | ICD-10-CM

## 2019-03-22 DIAGNOSIS — Z87891 Personal history of nicotine dependence: Secondary | ICD-10-CM | POA: Insufficient documentation

## 2019-03-22 DIAGNOSIS — Z79899 Other long term (current) drug therapy: Secondary | ICD-10-CM | POA: Diagnosis not present

## 2019-03-22 DIAGNOSIS — E785 Hyperlipidemia, unspecified: Secondary | ICD-10-CM | POA: Diagnosis not present

## 2019-03-22 DIAGNOSIS — I129 Hypertensive chronic kidney disease with stage 1 through stage 4 chronic kidney disease, or unspecified chronic kidney disease: Secondary | ICD-10-CM | POA: Diagnosis not present

## 2019-03-22 DIAGNOSIS — J432 Centrilobular emphysema: Secondary | ICD-10-CM | POA: Insufficient documentation

## 2019-03-22 DIAGNOSIS — Z951 Presence of aortocoronary bypass graft: Secondary | ICD-10-CM | POA: Diagnosis not present

## 2019-03-22 DIAGNOSIS — I2510 Atherosclerotic heart disease of native coronary artery without angina pectoris: Secondary | ICD-10-CM | POA: Diagnosis not present

## 2019-03-22 DIAGNOSIS — R600 Localized edema: Secondary | ICD-10-CM | POA: Diagnosis not present

## 2019-03-22 DIAGNOSIS — M3214 Glomerular disease in systemic lupus erythematosus: Secondary | ICD-10-CM | POA: Insufficient documentation

## 2019-03-22 DIAGNOSIS — D649 Anemia, unspecified: Secondary | ICD-10-CM | POA: Diagnosis not present

## 2019-03-22 DIAGNOSIS — C8339 Diffuse large B-cell lymphoma, extranodal and solid organ sites: Secondary | ICD-10-CM | POA: Insufficient documentation

## 2019-03-22 DIAGNOSIS — Z923 Personal history of irradiation: Secondary | ICD-10-CM | POA: Insufficient documentation

## 2019-03-22 DIAGNOSIS — K219 Gastro-esophageal reflux disease without esophagitis: Secondary | ICD-10-CM | POA: Diagnosis not present

## 2019-03-22 LAB — CBC WITH DIFFERENTIAL/PLATELET
Abs Immature Granulocytes: 0.04 10*3/uL (ref 0.00–0.07)
Basophils Absolute: 0 10*3/uL (ref 0.0–0.1)
Basophils Relative: 1 %
Eosinophils Absolute: 0.8 10*3/uL — ABNORMAL HIGH (ref 0.0–0.5)
Eosinophils Relative: 10 %
HCT: 34.4 % — ABNORMAL LOW (ref 36.0–46.0)
Hemoglobin: 11.1 g/dL — ABNORMAL LOW (ref 12.0–15.0)
Immature Granulocytes: 1 %
Lymphocytes Relative: 22 %
Lymphs Abs: 1.8 10*3/uL (ref 0.7–4.0)
MCH: 31.6 pg (ref 26.0–34.0)
MCHC: 32.3 g/dL (ref 30.0–36.0)
MCV: 98 fL (ref 80.0–100.0)
Monocytes Absolute: 0.4 10*3/uL (ref 0.1–1.0)
Monocytes Relative: 5 %
Neutro Abs: 5.1 10*3/uL (ref 1.7–7.7)
Neutrophils Relative %: 61 %
Platelets: 150 10*3/uL (ref 150–400)
RBC: 3.51 MIL/uL — ABNORMAL LOW (ref 3.87–5.11)
RDW: 14.4 % (ref 11.5–15.5)
WBC: 8.2 10*3/uL (ref 4.0–10.5)
nRBC: 0 % (ref 0.0–0.2)

## 2019-03-22 LAB — COMPREHENSIVE METABOLIC PANEL
ALT: 13 U/L (ref 0–44)
AST: 22 U/L (ref 15–41)
Albumin: 3.7 g/dL (ref 3.5–5.0)
Alkaline Phosphatase: 64 U/L (ref 38–126)
Anion gap: 10 (ref 5–15)
BUN: 25 mg/dL — ABNORMAL HIGH (ref 8–23)
CO2: 25 mmol/L (ref 22–32)
Calcium: 9.1 mg/dL (ref 8.9–10.3)
Chloride: 103 mmol/L (ref 98–111)
Creatinine, Ser: 1.2 mg/dL — ABNORMAL HIGH (ref 0.44–1.00)
GFR calc Af Amer: 53 mL/min — ABNORMAL LOW (ref >60)
GFR calc non Af Amer: 45 mL/min — ABNORMAL LOW (ref >60)
Glucose, Bld: 127 mg/dL — ABNORMAL HIGH (ref 70–99)
Potassium: 3.7 mmol/L (ref 3.5–5.1)
Sodium: 138 mmol/L (ref 135–145)
Total Bilirubin: 0.2 mg/dL — ABNORMAL LOW (ref 0.3–1.2)
Total Protein: 6.2 g/dL — ABNORMAL LOW (ref 6.5–8.1)

## 2019-03-22 LAB — IRON AND TIBC
Iron: 42 ug/dL (ref 28–170)
Saturation Ratios: 15 % (ref 10.4–31.8)
TIBC: 273 ug/dL (ref 250–450)
UIBC: 231 ug/dL

## 2019-03-22 LAB — RETICULOCYTES
Immature Retic Fract: 23.5 % — ABNORMAL HIGH (ref 2.3–15.9)
RBC.: 3.53 MIL/uL — ABNORMAL LOW (ref 3.87–5.11)
Retic Count, Absolute: 73.1 10*3/uL (ref 19.0–186.0)
Retic Ct Pct: 2.1 % (ref 0.4–3.1)

## 2019-03-22 LAB — LACTATE DEHYDROGENASE: LDH: 247 U/L — ABNORMAL HIGH (ref 98–192)

## 2019-03-22 LAB — FERRITIN: Ferritin: 93 ng/mL (ref 11–307)

## 2019-03-22 LAB — URIC ACID: Uric Acid, Serum: 5.8 mg/dL (ref 2.5–7.1)

## 2019-03-22 MED ORDER — SODIUM CHLORIDE 0.9% FLUSH
10.0000 mL | INTRAVENOUS | Status: DC | PRN
  Administered 2019-03-22: 15:00:00 10 mL via INTRAVENOUS
  Filled 2019-03-22: qty 10

## 2019-03-22 MED ORDER — HEPARIN SOD (PORK) LOCK FLUSH 100 UNIT/ML IV SOLN
500.0000 [IU] | Freq: Once | INTRAVENOUS | Status: AC
  Administered 2019-03-22: 500 [IU] via INTRAVENOUS

## 2019-03-22 NOTE — Progress Notes (Signed)
No new changes noted today 

## 2019-03-23 LAB — HAPTOGLOBIN: Haptoglobin: 72 mg/dL (ref 42–346)

## 2019-03-30 ENCOUNTER — Telehealth: Payer: Self-pay | Admitting: Pulmonary Disease

## 2019-03-30 NOTE — Telephone Encounter (Signed)
PFT has been canceled per pt's spouse. Per note, if pt wished to R/S they will contact us back in the future if needed. Nothing else needed at this time. Rhonda J Cobb

## 2019-03-30 NOTE — Telephone Encounter (Signed)
Pt's husband returning call.  [phone].

## 2019-03-30 NOTE — Telephone Encounter (Signed)
Called and spoke to pt's spouse, Marcello Moores.  Marcello Moores stated that pt wished to cancel PFT, due to her breathing improving. Pt will call back if she wished to reschedule in the future.   Rhonda, please advise.

## 2019-03-30 NOTE — Telephone Encounter (Signed)
Left message to relay date/time for covid test 04/02/2019 prior to 11:[facility]

## 2019-04-03 ENCOUNTER — Ambulatory Visit
Admission: RE | Admit: 2019-04-03 | Discharge: 2019-04-03 | Disposition: A | Discharge status: Home / Self Care | Payer: Medicare Other | Source: Ambulatory Visit | Attending: Pulmonary Disease | Admitting: Pulmonary Disease

## 2019-04-03 ENCOUNTER — Other Ambulatory Visit: Payer: Self-pay

## 2019-04-03 ENCOUNTER — Ambulatory Visit: Payer: Medicare Other

## 2019-04-03 DIAGNOSIS — I1310 Hypertensive heart and chronic kidney disease without heart failure, with stage 1 through stage 4 chronic kidney disease, or unspecified chronic kidney disease: Secondary | ICD-10-CM | POA: Insufficient documentation

## 2019-04-03 DIAGNOSIS — R0600 Dyspnea, unspecified: Secondary | ICD-10-CM | POA: Insufficient documentation

## 2019-04-03 DIAGNOSIS — I083 Combined rheumatic disorders of mitral, aortic and tricuspid valves: Secondary | ICD-10-CM | POA: Insufficient documentation

## 2019-04-03 DIAGNOSIS — N189 Chronic kidney disease, unspecified: Secondary | ICD-10-CM | POA: Diagnosis not present

## 2019-04-03 DIAGNOSIS — E785 Hyperlipidemia, unspecified: Secondary | ICD-10-CM | POA: Diagnosis not present

## 2019-04-03 DIAGNOSIS — Z951 Presence of aortocoronary bypass graft: Secondary | ICD-10-CM | POA: Insufficient documentation

## 2019-04-03 NOTE — Progress Notes (Signed)
*  PRELIMINARY RESULTS* Echocardiogram 2D Echocardiogram has been performed.  Sherrie Sport 04/03/2019, 11:20 AM

## 2019-04-23 ENCOUNTER — Inpatient Hospital Stay: Payer: Medicare Other | Attending: Hematology and Oncology

## 2019-04-23 ENCOUNTER — Inpatient Hospital Stay: Payer: Medicare Other

## 2019-04-23 ENCOUNTER — Other Ambulatory Visit: Payer: Self-pay

## 2019-04-23 DIAGNOSIS — C8339 Diffuse large B-cell lymphoma, extranodal and solid organ sites: Secondary | ICD-10-CM | POA: Diagnosis present

## 2019-04-23 DIAGNOSIS — D649 Anemia, unspecified: Secondary | ICD-10-CM

## 2019-04-23 DIAGNOSIS — E538 Deficiency of other specified B group vitamins: Secondary | ICD-10-CM

## 2019-04-23 DIAGNOSIS — Z452 Encounter for adjustment and management of vascular access device: Secondary | ICD-10-CM | POA: Insufficient documentation

## 2019-04-23 LAB — CBC
HCT: 40 % (ref 36.0–46.0)
Hemoglobin: 12.7 g/dL (ref 12.0–15.0)
MCH: 31.3 pg (ref 26.0–34.0)
MCHC: 31.8 g/dL (ref 30.0–36.0)
MCV: 98.5 fL (ref 80.0–100.0)
Platelets: 153 10*3/uL (ref 150–400)
RBC: 4.06 MIL/uL (ref 3.87–5.11)
RDW: 13.6 % (ref 11.5–15.5)
WBC: 6.3 10*3/uL (ref 4.0–10.5)
nRBC: 0 % (ref 0.0–0.2)

## 2019-04-30 ENCOUNTER — Other Ambulatory Visit: Payer: Self-pay | Admitting: Physical Medicine and Rehabilitation

## 2019-04-30 DIAGNOSIS — M5412 Radiculopathy, cervical region: Secondary | ICD-10-CM

## 2019-05-08 ENCOUNTER — Ambulatory Visit
Admission: RE | Admit: 2019-05-08 | Discharge: 2019-05-08 | Disposition: A | Discharge status: Home / Self Care | Payer: Medicare Other | Source: Ambulatory Visit | Attending: Physical Medicine and Rehabilitation | Admitting: Physical Medicine and Rehabilitation

## 2019-05-08 ENCOUNTER — Other Ambulatory Visit: Payer: Self-pay

## 2019-05-08 DIAGNOSIS — M5412 Radiculopathy, cervical region: Secondary | ICD-10-CM | POA: Insufficient documentation

## 2019-05-09 ENCOUNTER — Encounter: Payer: Self-pay | Admitting: Radiation Oncology

## 2019-05-09 ENCOUNTER — Ambulatory Visit
Admission: RE | Admit: 2019-05-09 | Discharge: 2019-05-09 | Disposition: A | Discharge status: Home / Self Care | Payer: Medicare Other | Source: Ambulatory Visit | Attending: Radiation Oncology | Admitting: Radiation Oncology

## 2019-05-09 ENCOUNTER — Other Ambulatory Visit: Payer: Self-pay

## 2019-05-09 VITALS — BP 149/83 | HR 107 | Temp 97.8°F | Resp 18 | Wt 115.0 lb

## 2019-05-09 DIAGNOSIS — C8330 Diffuse large B-cell lymphoma, unspecified site: Secondary | ICD-10-CM | POA: Insufficient documentation

## 2019-05-09 DIAGNOSIS — C8339 Diffuse large B-cell lymphoma, extranodal and solid organ sites: Secondary | ICD-10-CM

## 2019-05-18 ENCOUNTER — Other Ambulatory Visit: Payer: Self-pay

## 2019-05-18 ENCOUNTER — Inpatient Hospital Stay: Payer: Medicare Other

## 2019-05-18 VITALS — BP 115/73 | HR 92 | Temp 97.8°F | Resp 18

## 2019-05-18 DIAGNOSIS — C8339 Diffuse large B-cell lymphoma, extranodal and solid organ sites: Secondary | ICD-10-CM | POA: Diagnosis not present

## 2019-05-18 DIAGNOSIS — Z95828 Presence of other vascular implants and grafts: Secondary | ICD-10-CM

## 2019-05-18 MED ORDER — SODIUM CHLORIDE 0.9% FLUSH
10.0000 mL | INTRAVENOUS | Status: DC | PRN
  Administered 2019-05-18: 10 mL via INTRAVENOUS
  Filled 2019-05-18: qty 10

## 2019-05-18 MED ORDER — HEPARIN SOD (PORK) LOCK FLUSH 100 UNIT/ML IV SOLN
500.0000 [IU] | Freq: Once | INTRAVENOUS | Status: AC
  Administered 2019-05-18: 500 [IU] via INTRAVENOUS

## 2019-06-20 ENCOUNTER — Telehealth: Payer: Self-pay

## 2019-06-20 ENCOUNTER — Inpatient Hospital Stay: Payer: Medicare Other | Attending: Hematology and Oncology

## 2019-06-20 ENCOUNTER — Other Ambulatory Visit: Payer: Self-pay

## 2019-06-20 DIAGNOSIS — I2510 Atherosclerotic heart disease of native coronary artery without angina pectoris: Secondary | ICD-10-CM | POA: Diagnosis not present

## 2019-06-20 DIAGNOSIS — Z79899 Other long term (current) drug therapy: Secondary | ICD-10-CM | POA: Diagnosis not present

## 2019-06-20 DIAGNOSIS — E876 Hypokalemia: Secondary | ICD-10-CM | POA: Insufficient documentation

## 2019-06-20 DIAGNOSIS — C8339 Diffuse large B-cell lymphoma, extranodal and solid organ sites: Secondary | ICD-10-CM | POA: Diagnosis not present

## 2019-06-20 DIAGNOSIS — N189 Chronic kidney disease, unspecified: Secondary | ICD-10-CM | POA: Insufficient documentation

## 2019-06-20 DIAGNOSIS — Z9221 Personal history of antineoplastic chemotherapy: Secondary | ICD-10-CM | POA: Insufficient documentation

## 2019-06-20 DIAGNOSIS — I129 Hypertensive chronic kidney disease with stage 1 through stage 4 chronic kidney disease, or unspecified chronic kidney disease: Secondary | ICD-10-CM | POA: Insufficient documentation

## 2019-06-20 DIAGNOSIS — Z951 Presence of aortocoronary bypass graft: Secondary | ICD-10-CM | POA: Diagnosis not present

## 2019-06-20 DIAGNOSIS — Z923 Personal history of irradiation: Secondary | ICD-10-CM | POA: Diagnosis not present

## 2019-06-20 DIAGNOSIS — Z87891 Personal history of nicotine dependence: Secondary | ICD-10-CM | POA: Insufficient documentation

## 2019-06-20 DIAGNOSIS — M3214 Glomerular disease in systemic lupus erythematosus: Secondary | ICD-10-CM | POA: Insufficient documentation

## 2019-06-20 DIAGNOSIS — I700 Atherosclerosis of aorta: Secondary | ICD-10-CM | POA: Diagnosis not present

## 2019-06-20 DIAGNOSIS — E785 Hyperlipidemia, unspecified: Secondary | ICD-10-CM | POA: Diagnosis not present

## 2019-06-20 DIAGNOSIS — Z452 Encounter for adjustment and management of vascular access device: Secondary | ICD-10-CM | POA: Diagnosis not present

## 2019-06-20 DIAGNOSIS — K219 Gastro-esophageal reflux disease without esophagitis: Secondary | ICD-10-CM | POA: Diagnosis not present

## 2019-06-20 DIAGNOSIS — M50221 Other cervical disc displacement at C4-C5 level: Secondary | ICD-10-CM | POA: Diagnosis not present

## 2019-06-20 DIAGNOSIS — C83398 Diffuse large b-cell lymphoma of other extranodal and solid organ sites: Secondary | ICD-10-CM

## 2019-06-20 DIAGNOSIS — D649 Anemia, unspecified: Secondary | ICD-10-CM

## 2019-06-20 DIAGNOSIS — E538 Deficiency of other specified B group vitamins: Secondary | ICD-10-CM

## 2019-06-20 LAB — COMPREHENSIVE METABOLIC PANEL
ALT: 16 U/L (ref 0–44)
AST: 27 U/L (ref 15–41)
Albumin: 3.9 g/dL (ref 3.5–5.0)
Alkaline Phosphatase: 81 U/L (ref 38–126)
Anion gap: 8 (ref 5–15)
BUN: 18 mg/dL (ref 8–23)
CO2: 27 mmol/L (ref 22–32)
Calcium: 8.4 mg/dL — ABNORMAL LOW (ref 8.9–10.3)
Chloride: 103 mmol/L (ref 98–111)
Creatinine, Ser: 1 mg/dL (ref 0.44–1.00)
GFR calc Af Amer: >60 mL/min (ref >60)
GFR calc non Af Amer: 57 mL/min — ABNORMAL LOW (ref >60)
Glucose, Bld: 99 mg/dL (ref 70–99)
Potassium: 3.2 mmol/L — ABNORMAL LOW (ref 3.5–5.1)
Sodium: 138 mmol/L (ref 135–145)
Total Bilirubin: 0.5 mg/dL (ref 0.3–1.2)
Total Protein: 6.2 g/dL — ABNORMAL LOW (ref 6.5–8.1)

## 2019-06-20 LAB — CBC WITH DIFFERENTIAL/PLATELET
Abs Immature Granulocytes: 0.05 10*3/uL (ref 0.00–0.07)
Basophils Absolute: 0 10*3/uL (ref 0.0–0.1)
Basophils Relative: 0 %
Eosinophils Absolute: 0.2 10*3/uL (ref 0.0–0.5)
Eosinophils Relative: 4 %
HCT: 38.2 % (ref 36.0–46.0)
Hemoglobin: 12.2 g/dL (ref 12.0–15.0)
Immature Granulocytes: 1 %
Lymphocytes Relative: 22 %
Lymphs Abs: 1.1 10*3/uL (ref 0.7–4.0)
MCH: 31.4 pg (ref 26.0–34.0)
MCHC: 31.9 g/dL (ref 30.0–36.0)
MCV: 98.5 fL (ref 80.0–100.0)
Monocytes Absolute: 0.7 10*3/uL (ref 0.1–1.0)
Monocytes Relative: 13 %
Neutro Abs: 3.1 10*3/uL (ref 1.7–7.7)
Neutrophils Relative %: 60 %
Platelets: 161 10*3/uL (ref 150–400)
RBC: 3.88 MIL/uL (ref 3.87–5.11)
RDW: 13.2 % (ref 11.5–15.5)
WBC: 5.1 10*3/uL (ref 4.0–10.5)
nRBC: 0 % (ref 0.0–0.2)

## 2019-06-20 LAB — LACTATE DEHYDROGENASE: LDH: 236 U/L — ABNORMAL HIGH (ref 98–192)

## 2019-06-20 LAB — FERRITIN: Ferritin: 123 ng/mL (ref 11–307)

## 2019-06-20 LAB — URIC ACID: Uric Acid, Serum: 5.3 mg/dL (ref 2.5–7.1)

## 2019-06-20 MED ORDER — SODIUM CHLORIDE 0.9% FLUSH
10.0000 mL | INTRAVENOUS | Status: DC | PRN
  Administered 2019-06-20: 10 mL via INTRAVENOUS
  Filled 2019-06-20: qty 10

## 2019-06-20 MED ORDER — HEPARIN SOD (PORK) LOCK FLUSH 100 UNIT/ML IV SOLN
500.0000 [IU] | Freq: Once | INTRAVENOUS | Status: AC
  Administered 2019-06-20: 500 [IU] via INTRAVENOUS

## 2019-06-20 NOTE — Progress Notes (Signed)
Completed Assessment with patient in person. Denies any concerns.

## 2019-06-20 NOTE — Telephone Encounter (Signed)
Attempted to contact patient regarding Hypokalemia. VM left requesting callback.

## 2019-06-20 NOTE — Telephone Encounter (Signed)
-----   Message from Lequita Asal, MD sent at 06/20/2019  2:37 PM EST ----- Regarding: Please call patient  Potassium 3.2.  Is she on a diuretic?  Has she had any diarrhea?  Has she taken oral potassium before?  M ----- Message ----- From: Interface, [facility] [place] Sent: 06/20/2019   1:50 PM EST To: Lequita Asal, MD

## 2019-06-21 ENCOUNTER — Inpatient Hospital Stay: Payer: Medicare Other

## 2019-06-21 ENCOUNTER — Encounter: Payer: Self-pay | Admitting: Hematology and Oncology

## 2019-06-21 ENCOUNTER — Inpatient Hospital Stay: Payer: Medicare Other | Admitting: Hematology and Oncology

## 2019-06-21 DIAGNOSIS — C8339 Diffuse large B-cell lymphoma, extranodal and solid organ sites: Secondary | ICD-10-CM

## 2019-06-21 DIAGNOSIS — E876 Hypokalemia: Secondary | ICD-10-CM | POA: Insufficient documentation

## 2019-06-21 DIAGNOSIS — D649 Anemia, unspecified: Secondary | ICD-10-CM

## 2019-06-22 ENCOUNTER — Encounter: Payer: Self-pay | Admitting: Hematology and Oncology

## 2019-06-22 ENCOUNTER — Inpatient Hospital Stay (HOSPITAL_BASED_OUTPATIENT_CLINIC_OR_DEPARTMENT_OTHER): Payer: Medicare Other | Admitting: Hematology and Oncology

## 2019-06-22 DIAGNOSIS — C8339 Diffuse large B-cell lymphoma, extranodal and solid organ sites: Secondary | ICD-10-CM

## 2019-06-22 DIAGNOSIS — R931 Abnormal findings on diagnostic imaging of heart and coronary circulation: Secondary | ICD-10-CM | POA: Diagnosis not present

## 2019-06-22 DIAGNOSIS — C83398 Diffuse large b-cell lymphoma of other extranodal and solid organ sites: Secondary | ICD-10-CM

## 2019-06-22 DIAGNOSIS — Z7189 Other specified counseling: Secondary | ICD-10-CM | POA: Diagnosis not present

## 2019-06-22 DIAGNOSIS — E876 Hypokalemia: Secondary | ICD-10-CM | POA: Diagnosis not present

## 2019-06-22 NOTE — Progress Notes (Signed)
[facility]  [address], [address] [place], [place] [address] Phone: [phone]  Fax: [phone]  Telemedicine Office Visit:  06/22/2019  Referring physician: [name], MD  I connected with Judy Judy Torres on 06/22/2019 at 12:16 PM by videoconferencing and verified that I was speaking with the correct person using 2 identifiers.  The patient was at home.  I discussed the limitations, risk, security and privacy concerns of performing an evaluation and management service by videoconferencing and the availability of Judy person appointments.  I also discussed with the patient that there may be a patient responsible charge related to this service.  The patient expressed understanding and agreed to proceed.   Chief Complaint: Judy Torres is a 73 y.o. 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  HPI: The patient was last seen Judy the medical oncology clinic on 03/21/2019. At that time, she noted improving shortness of breath s/p radiation.  Exam revealed no adenopathy or hepatosplenomegaly.  She had bilateral 2+ lower extremity edema (left > right). Hematocrit 34.4, hemoglobin 11.1, MCV 98. Ferritin 93 with an iron saturation of 15% and a TIBC of 273. Creatinine was 1.20.  Surveillance continued.  She was seen Judy physical medicine and rehabilitation by Allene Dillon, NP on 04/30/2019.  She had severe right scapular pain extending down the right triceps and ulnar aspect of the forearm x 3 weeks. She had been utilizing heat, muscle rubs, Tylenol, Advil and tramadol without relief of pain. Patient continued Tylenol prn. Her gabapentin was increased to 100 mg TID. She was prescribed Norco 5/325 1 tablet BID.   Cervical spine MRI on 05/08/2019 revealed reversal of the normal cervical lordosis with the apex at C3-4 contributed to central canal narrowing. Flattening of the ventral cord at C3-4 where there was also moderately  severe left and right foraminal narrowing. Broad-based right paracentral protrusion at C4-5 deforms the ventral cord. There was severe left and moderate to moderately severe right foraminal narrowing at this level. Broad-based central protrusion at C5-6 effaces the ventral thecal sac. Severe right and moderate left foraminal narrowing were also seen at C5-6. Central disc protrusion at C6-7 effaces the ventral thecal sac. Moderate to moderately severe bilateral foraminal narrowing at this level appears worse on the right.  Labs followed: 04/23/2019: Hematocrit 40.0, hemoglobin 12.7, platelets 153,000, WBC 6,300.  06/20/2019: Hematocrit 38.2, hemoglobin 12.2, platelets 161,000, WBC 5,100. Ferritin 123. Potassium 3.2. Calcium 8.4 (corr 8.485). LDH 236 (98-192). Uric acid 5.3.   During the interim, she has felt "fine".  She denies any fevers, sweats or weight loss.  She describes a "little cough".    She sees Dr. Nehemiah Massed and was told her heart was fine back Judy March of 2019   Echo on 04/04/2018:  EF was 55-60% Echo on 07/19/2018:  EF was 50-55% Echo on 04/03/2019:  EF was 45-50%.  Symptomatically, she feels "fine". She is unsure of her weight. He has a slight cough. She denies any chest pain.   Past Medical History:  Diagnosis Date   Cancer [facility])    Stomach cancer   Chronic kidney disease    GERD (gastroesophageal reflux disease)    HTN (hypertension)    Hyperlipidemia    Lupus (HCC)    S/P angioplasty with stent     Past Surgical History:  Procedure Laterality Date   CARDIAC SURGERY     CESAREAN SECTION     COLONOSCOPY  N/A 12/30/2017   Procedure: COLONOSCOPY;  Surgeon: Lin Landsman, MD;  Location: [facility] ENDOSCOPY;  Service: Gastroenterology;  Laterality: N/A;   CORONARY ARTERY BYPASS GRAFT  2003   ESOPHAGOGASTRODUODENOSCOPY N/A 12/30/2017   Procedure: ESOPHAGOGASTRODUODENOSCOPY (EGD);  Surgeon: Lin Landsman, MD;  Location: [facility] ENDOSCOPY;  Service:  Gastroenterology;  Laterality: N/A;   ESOPHAGOGASTRODUODENOSCOPY Left 02/05/2018   Procedure: ESOPHAGOGASTRODUODENOSCOPY (EGD);  Surgeon: Virgel Manifold, MD;  Location: [facility] ENDOSCOPY;  Service: Endoscopy;  Laterality: Left;   ESOPHAGOGASTRODUODENOSCOPY (EGD) WITH PROPOFOL N/A 03/15/2018   Procedure: ESOPHAGOGASTRODUODENOSCOPY (EGD) WITH PROPOFOL;  Surgeon: Virgel Manifold, MD;  Location: [facility] ENDOSCOPY;  Service: Endoscopy;  Laterality: N/A;   FLEXIBLE SIGMOIDOSCOPY N/A 12/31/2017   Procedure: FLEXIBLE SIGMOIDOSCOPY;  Surgeon: Jonathon Bellows, MD;  Location: [facility] ENDOSCOPY;  Service: Gastroenterology;  Laterality: N/A;   PORTA CATH INSERTION N/A 04/19/2018   Procedure: PORTA CATH INSERTION;  Surgeon: Algernon Huxley, MD;  Location: [facility] LAB;  Service: Cardiovascular;  Laterality: N/A;    Family History  Problem Relation Age of Onset   Hypertension Mother    Stroke Mother    Vascular Disease Mother    Hypertension Sister     Social History:  reports that she quit smoking about 17 years ago. Her smoking use included cigarettes. She has a 10.00 pack-year smoking history. She has never used smokeless tobacco. She reports previous alcohol use. She reports that she does not use drugs. She has smoked 1/2 pack/day x 20 years. She stopped smoking Judy 2000. She denies any exposure to radiation or toxins. She is from [place]. She moved to the [place] 52 years ago after marrying her husband. She was Judy the [employer]. Her husband [phone]).The patient is accompanied by her husband today.  Participants Judy the patient's visit and their role Judy the encounter included the patient, Samul Dada, scribe, and [employer], CMA, today.  The intake visit was provided by Samul Dada, scribe, Vito Berger, CMA.   Allergies:  Allergies  Allergen Reactions   Penicillins Other (See Comments)    Pass out Has patient had a PCN reaction causing immediate rash,  facial/tongue/throat swelling, SOB or lightheadedness with hypotension: Yes Has patient had a PCN reaction causing severe rash involving mucus membranes or skin necrosis: No Has patient had a PCN reaction that required hospitalization: No Has patient had a PCN reaction occurring within the last 10 years: No If all of the above answers are "NO", then may proceed with Cephalosporin use.    Sulfa Antibiotics Nausea Only   Sulfasalazine Nausea And Vomiting        Ace Inhibitors Nausea Only   Losartan Anxiety   Spironolactone Other (See Comments)    Felt poorly but couldn't clarify     Current Medications: Current Outpatient Medications  Medication Sig Dispense Refill   alendronate (FOSAMAX) 70 MG tablet Take 70 mg by mouth every Saturday.      allopurinol (ZYLOPRIM) 100 MG tablet TAKE 1/2 TABLET BY MOUTH EVERY DAY 45 tablet 1   amLODipine (NORVASC) 10 MG tablet Take 10 mg by mouth daily.     ferrous sulfate 325 (65 FE) MG tablet Take 1 tablet (325 mg total) by mouth 2 (two) times daily with a meal. 60 tablet 3   HYDROcodone-acetaminophen (NORCO/VICODIN) 5-325 MG tablet Take by mouth.     hydroxychloroquine (PLAQUENIL) 200 MG tablet Take 200 mg by mouth 2 (two) times daily.      Multiple Vitamin (MULTIVITAMIN WITH MINERALS) TABS  tablet Take 1 tablet by mouth daily. 180 tablet 0   polyethylene glycol (MIRALAX / GLYCOLAX) packet Take 17 g by mouth daily.     predniSONE (DELTASONE) 5 MG tablet Take 5 mg by mouth daily with breakfast.      simvastatin (ZOCOR) 40 MG tablet Take 40 mg by mouth daily.      sucralfate (CARAFATE) 1 g tablet TAKE 1 TABLET BY MOUTH 3 (THREE) TIMES DAILY. DISSOLVE Judy 3-4 TBSP WARM WATER, SWISH AND SWALLOW 270 tablet 2   traMADol (ULTRAM) 50 MG tablet Take 50 mg by mouth 2 (two) times daily as needed for moderate pain.      acetaminophen (TYLENOL) 500 MG tablet Take 1,000 mg by mouth 2 (two) times daily as needed for moderate pain or headache.      clotrimazole-betamethasone (LOTRISONE) cream Apply 1 application topically 2 (two) times daily as needed (rash).      dexamethasone (DECADRON) 4 MG tablet Take 1 tablet (4 mg total) by mouth daily. (Patient not taking: Reported on 03/22/2019) 30 tablet 0   feeding supplement, ENSURE ENLIVE, (ENSURE ENLIVE) LIQD Take 237 mLs by mouth 2 (two) times daily between meals. (Patient not taking: Reported on 03/22/2019) 90 Bottle 0   fluticasone (FLONASE) 50 MCG/ACT nasal spray Place 1 spray into both nostrils daily as needed for allergies or rhinitis.     lidocaine-prilocaine (EMLA) cream Apply to affected area once (Patient not taking: Reported on 06/22/2019) 30 g 3   ondansetron (ZOFRAN) 4 MG tablet Take 1 tablet (4 mg total) by mouth 2 (two) times daily as needed for refractory nausea / vomiting. Start on day 3 after cyclophosphamide chemotherapy. (Patient not taking: Reported on 06/22/2019) 30 tablet 3   Pseudoephedrine-APAP 30-325 MG TABS Take 1 tablet by mouth daily as needed (sinuses).      ranitidine (ZANTAC) 150 MG tablet Take 150 mg by mouth daily as needed for heartburn.      No current facility-administered medications for this visit.     Review of Systems  Constitutional: Negative.  Negative for chills, diaphoresis, fever, malaise/fatigue and weight loss (stable).       Feels "fine".  HENT: Negative.  Negative for congestion, ear discharge, ear pain, hearing loss, nosebleeds, sinus pain and sore throat.   Eyes: Negative.  Negative for blurred vision, double vision, photophobia and pain.  Respiratory: Positive for cough (seasonal) and shortness of breath. Negative for hemoptysis and sputum production.   Cardiovascular: Positive for leg swelling (chronic BILATERAL ankles). Negative for chest pain, palpitations, orthopnea and PND.       Serial echoes reveal declining EF.  Gastrointestinal: Negative.  Negative for abdominal pain, blood Judy stool, constipation, diarrhea, heartburn, melena, nausea  and vomiting.       Eating well.  Genitourinary: Negative.  Negative for dysuria, frequency, hematuria and urgency.  Musculoskeletal: Negative for back pain, falls, joint pain, myalgias and neck pain.       Right scapular pain, improved.  Skin: Negative.  Negative for itching and rash.  Neurological: Negative.  Negative for dizziness, tingling, tremors, sensory change, speech change, focal weakness, weakness and headaches.  Endo/Heme/Allergies: Negative.  Does not bruise/bleed easily.  Psychiatric/Behavioral: Negative.  Negative for depression and memory loss. The patient is not nervous/anxious and does not have insomnia.   All other systems reviewed and are negative.  Performance status (ECOG): 0  Physical Exam  Constitutional: She is oriented to person, place, and time. She appears well-developed and well-nourished. No distress.  HENT:  Head: Normocephalic and atraumatic.  Short dark graying hair.  Eyes: Conjunctivae and EOM are normal.  Glasses.  Brown eyes.  Abdominal: Normal appearance.  Neurological: She is alert and oriented to person, place, and time.  Skin: She is not diaphoretic.  Psychiatric: She has a normal mood and affect. Her behavior is normal. Judgment and thought content normal.  Nursing note reviewed.   Imaging studies: 04/06/2018:PET scanrevealed enlarged and hypermetabolic supraclavicular, mediastinal, abdominal and pelvic adenopathy. Additional smaller hypermetabolic lymph nodes identified within the left cervical lymph node chain. Largest lymph node was a 5.7 cm subcarinal lymph node (SUV 26.78). 07/03/2018:Chest, abdomen, and pelvic CTrevealed marked interval response to therapy. Previously identified relatively bulky lymphadenopathy Judy the chest, abdomen, and pelvis had resolved with no pathologically enlarged lymphadenopathy. There was no new or progressive interval findings. 09/25/2018:PET scanrevealed partial response to chemotherapy. The bulky  intensely hypermetabolic mediastinal adenopathy was near completely resolved; however a solitary 1.3 cm node remained with intense radiotracer activity (Deauville 4). There was complete metabolic response and size reduction of cervical lymph nodes, retroperitoneal periaortic nodes and pelvic nodes (Deauville 1). 01/09/2019:ChestCTwithout contrastrevealed severe bilateral paramediastinal radiation fibrosis changes.There was underlying emphysematous changes and pulmonary scarring. There were no findings suspicious for pulmonary metastatic disease. There was interval decrease Judy size of the precarinal lymph node when compared to prior study. There was stable advanced atherosclerotic calcifications involving the thoracic and upper abdominal aorta and branch vessels including dense three-vessel coronary artery calcifications. 03/20/2019:  PET scan revealed further improvement Judy hypermetabolic activity associated with a small residual pre-carinal lymph node which had decreased Judy size compared with the prior PET-CT of 6 months ago (still Deauville 4).  There was no other residual or recurrent hypermetabolic nodal activity Judy the neck, chest, abdomen or pelvis (Deauville 1). There was resolving radiation changes Judy both lungs. There was stable extensive atherosclerosis and moderate centrilobular emphysema.    Infusion on 06/20/2019  Component Date Value Ref Range Status   LDH 06/20/2019 236* 98 - 192 U/L Final   Performed at [facility], [address], [place], [place] [address]   WBC 06/20/2019 5.1  4.0 - 10.5 K/uL Final   RBC 06/20/2019 3.88  3.87 - 5.11 MIL/uL Final   Hemoglobin 06/20/2019 12.2  12.0 - 15.0 g/dL Final   HCT 06/20/2019 38.2  36.0 - 46.0 % Final   MCV 06/20/2019 98.5  80.0 - 100.0 fL Final   MCH 06/20/2019 31.4  26.0 - 34.0 pg Final   MCHC 06/20/2019 31.9  30.0 - 36.0 g/dL Final   RDW 06/20/2019 13.2  11.5 - 15.5 % Final   Platelets 06/20/2019 161  150 - 400  K/uL Final   nRBC 06/20/2019 0.0  0.0 - 0.2 % Final   Neutrophils Relative % 06/20/2019 60  % Final   Neutro Abs 06/20/2019 3.1  1.7 - 7.7 K/uL Final   Lymphocytes Relative 06/20/2019 22  % Final   Lymphs Abs 06/20/2019 1.1  0.7 - 4.0 K/uL Final   Monocytes Relative 06/20/2019 13  % Final   Monocytes Absolute 06/20/2019 0.7  0.1 - 1.0 K/uL Final   Eosinophils Relative 06/20/2019 4  % Final   Eosinophils Absolute 06/20/2019 0.2  0.0 - 0.5 K/uL Final   Basophils Relative 06/20/2019 0  % Final   Basophils Absolute 06/20/2019 0.0  0.0 - 0.1 K/uL Final   Immature Granulocytes 06/20/2019 1  % Final   Abs Immature Granulocytes 06/20/2019 0.05  0.00 -  0.07 K/uL Final   Performed at [facility], [address] [place], [place] [address]   Ferritin 06/20/2019 123  11 - 307 ng/mL Final   Performed at [facility], [place], [place] [address]   Sodium 06/20/2019 138  135 - 145 mmol/L Final   Potassium 06/20/2019 3.2* 3.5 - 5.1 mmol/L Final   Chloride 06/20/2019 103  98 - 111 mmol/L Final   CO2 06/20/2019 27  22 - 32 mmol/L Final   Glucose, Bld 06/20/2019 99  70 - 99 mg/dL Final   BUN 06/20/2019 18  8 - 23 mg/dL Final   Creatinine, Ser 06/20/2019 1.00  0.44 - 1.00 mg/dL Final   Calcium 06/20/2019 8.4* 8.9 - 10.3 mg/dL Final   Total Protein 06/20/2019 6.2* 6.5 - 8.1 g/dL Final   Albumin 06/20/2019 3.9  3.5 - 5.0 g/dL Final   AST 06/20/2019 27  15 - 41 U/L Final   ALT 06/20/2019 16  0 - 44 U/L Final   Alkaline Phosphatase 06/20/2019 81  38 - 126 U/L Final   Total Bilirubin 06/20/2019 0.5  0.3 - 1.2 mg/dL Final   GFR calc non Af Amer 06/20/2019 57* >60 mL/min Final   GFR calc Af Amer 06/20/2019 >60  >60 mL/min Final   Anion gap 06/20/2019 8  5 - 15 Final   Performed at [facility], [address] [place], [place] [address]   Uric Acid, Serum 06/20/2019 5.3  2.5 - 7.1 mg/dL Final   Performed at [facility], [address], [place], [place] [address]    Assessment:  Judy Judy Torres is a 73 y.o. female with stage IVBdiffuse large B cell lymphomainvolving the gastric fundus and duodenum. IPI is 3 (intermediate-high risk), age-adjusted IPI is 2 (low-intermediate risk), revised IPI is 3 (poor), and NCCN-IPI is 5 (intermediate-high risk). CNS risk is 3 (intermediate: age, LDH, stage).  EGDon 03/15/2018 revealed pathology from the duodenal scalloped lesion and duodenal sweep ulcerwith involvement with diffuse large B cell lymphoma. Random gastric biopsy was negative for lymphoma. There was mild chronic gastritis with reactive foveolar hyperplasia. Gastric fundus scarrevealed involvement with diffuse large B cell lymphoma. GE junction biopsy revealed no lymphoma with squamocolumnar mucosa with mild chronic active inflammation.  The lymphoma has a non-germinal center immunophenotype with a Ki-67 proliferation index of 60% and shows Bcl-2/CMYC co-expression by immunohistochemical analysis. Summary of IHC results, neoplastic cells: Positive: PAX-5, CD20(variable), Bcl-2, MUM1, Bcl-6 (variable), CMYC. Negative: CD10, CD5, CD30, CD138, Cyclin-D1, ALK-1, CD3. EBV (ISH): Negative. FISH for MYCgene rearrangement (8q24 gene region) was negative.  PET scanon 04/06/2018 revealed enlarged and hypermetabolic supraclavicular, mediastinal, abdominal and pelvic adenopathy. Additional smaller hypermetabolic lymph nodes identified within the left cervical lymph node chain. Largest lymph node was a 5.7 cm subcarinal lymph node (SUV 26.78).  Bone marrow aspirate and biopsyon 04/12/2018 revealed a slightly hypercellular bone marrow for age with trilineage hematopoiesis. There were dyspoietic changes associated with the granulocyte series. Flow cytometry revealed a predominance of T lymphocytes with relative abundance of CD8 + cells. There was no significant B cell population. Cytogenetics are  pending.  Echorevealed an EF of 55-60% on 04/04/2018, 50-55% on 07/19/2018, and 45-50% on 04/03/2019.  She presented with a normocytic anemia. She was admitted x 2 (12/2017 and 01/2018) with rectal bleeding. She is intolerant of oral iron. Dietis modest.  EGDon 12/30/2017 revealed non bleeding gastric erosionsin the antrum. Colonoscopyon 12/30/2017 revealed numerous polyps(one 5 mm  Judy ascending colon, two 7 mm polyps Judy transverse colon, five 5-7 mm polyps Judy the descending colon, one 7 mm polyp Judy the sigmoid colon, one 8 mm polyp Judy the recto-sigmoid colon, and one 19 mm polyp Judy the rectum). 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   She developed hematochezia post colonoscopy. Flexible sigmoidoscopyon 12/31/2017 revealed a rectal ulcer at the site of the polypectomy and blood Judy the rectum. Clips were placed. EGDon 02/05/2018 revealed 2 bleeding AVMs (treated with APC) Judy the small bowel with scalloped/denuded mucosa underneath the bleeding sites.  Work-up on 09/16/2019revealed a hematocrit of 32.5, hemoglobin 10.7, MCV 91.7, platelets 207,000, WBC 1800 with an ANC of 600. Uric acid was 8.8. LDH was 241 (98-192) and beta2-microglobulin 5.8 (0.6-2.4). Negative studiesincluded: hepatitis B core antibody total, hepatitis B surface antigen, hepatitis C antibody, HIV testing.   She has required 4 units of PRBCs(3 units Judy 12/2017 and 1 unit Judy 01/2018).  Ferritinhas been followed: 30 on 12/29/2017, 93 on 02/13/2018, 57 on 03/07/2018, 38 on 04/03/2018, 93 on 03/22/2019, and 123 on 06/20/2019.  Labs on 12/29/2017 revealed the following: B12was 254 (low normal). She has been on oral B12since 03/07/2018. B12 was 1524 on 04/03/2018. Folate6.3 (>5.9) on 12/29/2017 and 55.8 on 04/03/2018.   Shereceived6cycles of mini-RCHOP(04/24/2018 - 09/10/2018) withgrowth factor support (Udencyat[place] On-Pro Neulasta). Cycle #1  platelet nadir was 82,000. Cycle #4 was postponed on 07/13/2018 ([place] 0).   PET scanon 09/25/2018 revealed partial response to chemotherapy. The bulky intensely hypermetabolic mediastinal adenopathy was near completely resolved; however a solitary 1.3 cm node remained with intense radiotracer activity (Deauville 4). There was complete metabolic response and size reduction of cervical lymph nodes, retroperitoneal periaortic nodes and pelvic nodes (Deauville 1).  She receivedradiation therapywith 3000 cGy to the mediastinum and 3600 cGy to the hypermetabolic lymph node from 70/17/7939 - 12/04/2018.  ChestCTwithout contraston 01/09/2019 revealed severe bilateral paramediastinal radiation fibrosis changes.There was underlying emphysematous changes and pulmonary scarring. There were no findings suspicious for pulmonary metastatic disease. There was interval decrease Judy size of the precarinal lymph node when compared to prior study. There was stable advanced atherosclerotic calcifications involving the thoracic and upper abdominal aorta and branch vessels including dense three-vessel coronary artery calcifications.  PET scan on 03/20/2019 revealed further improvement Judy hypermetabolic activity associated with a small residual pre-carinal lymph node which had decreased Judy size compared with the prior PET-CT of 6 months ago (still Deauville 4).  There was no other residual or recurrent hypermetabolic nodal activity Judy the neck, chest, abdomen or pelvis (Deauville 1). There was resolving radiation changes Judy both lungs. There was stable extensive atherosclerosis and moderate centrilobular emphysema.   She has chronicrenal insufficiency. Creatinine was 1.42 (CrCl 25 ml/min) on 04/03/2018.  She haslupus(diagnosed 1982). She is on prednisone 5 mg a day and Plaquenil.  Symptomatically, she denies any fevers, sweats or weight loss.  She denies any adenopathy, bruising or bleeding or interval  infections.  She denies any symptoms of CHF.  EF is declining.  Plan: 1.   Review labs from 06/20/2019. 2.Stage IVB diffuse large B cell lymphoma Clinically, she is doing well. Shereceived 6 cycles of mini R-CHOP (last 08/31/2018). She had a partial response to treatment. She underwent involved field radiation to the mediastinum (completed05/18/2020). PET on09/07/2018 revealed ongoing improvement Judy the mediastinal lymph node hypermetabolic activity.            She had no evidence of recurrent disease Judy the neck chest abdomen  or pelvis.            She had evolving radiation changes. Continue to monitor. If active disease, plan Rituxan and Revlimid. 3. History of renal insufficiency, resolved Creatinine 1.00. Continue to monitor. 4.Mild normocytic anemia, resolved             Hematocrit 38.2.  Hemoglobin 12.2.  MCV 98.5.              Ferritin 123.             No evidence of hemolysis.                         Haptoglobin normal.  Reticulocyte count 2.1%.                         LDH slightly elevated at 247 (98-192). 5.   Lower extremity edema             Bilateral lower extremity duplex on 03/22/2019 revealed no evidence of DVT.  Etiology possibly secondary to CHF. 6.   Declining ejection fraction  Echo on 04/04/2018:  EF was 55-60%  Echo on 07/19/2018:  EF was 50-55%  Echo on 04/03/2019:  EF was 45-50%.  Patient denies any symptoms of CHF.  Discuss follow-up with Dr Nehemiah Massed. 7.   Hypokalemia  Potassium 3.2 on 06/20/2019.  Discuss oral potassium pills or dietary modification.  Patient would like to eat potasium rich foods.   RTC Judy 1-2 weeks for BMP. 8.   RTC every 6-12 weeks for port-a-cath flush. 9.   RTC Judy 3 months for MD assessment, labs (CBC with diff, CMP, LDH, uric acid), and port-a-cath flush.  I discussed the assessment and treatment plan with the patient.  The patient was provided an opportunity to ask questions and all were answered.  The patient agreed  with the plan and demonstrated an understanding of the instructions.  The patient was advised to call back if the symptoms worsen or if the condition fails to improve as anticipated.  I provided 17 minutes (12:00 PM - 12:16 PM) of face-to-face video visit time during this this encounter and > 50% was spent counseling as documented under my assessment and plan.  I provided these services from the [facility] office.   Lequita Asal, MD, PhD    06/22/2019, 12:16 PM  I, Samul Dada, am acting as a scribe for Lequita Asal, MD.  I, Bancroft Mike Gip, MD, have reviewed the above documentation for accuracy and completeness, and I agree with the above.

## 2019-06-22 NOTE — Progress Notes (Signed)
The patient c/o no new changes today. The patient name and DOB has been verified by phone.

## 2019-06-25 DIAGNOSIS — R931 Abnormal findings on diagnostic imaging of heart and coronary circulation: Secondary | ICD-10-CM | POA: Insufficient documentation

## 2019-07-02 ENCOUNTER — Other Ambulatory Visit: Payer: Self-pay

## 2019-07-03 ENCOUNTER — Inpatient Hospital Stay: Payer: Medicare Other

## 2019-07-03 DIAGNOSIS — C8339 Diffuse large B-cell lymphoma, extranodal and solid organ sites: Secondary | ICD-10-CM | POA: Diagnosis not present

## 2019-07-03 LAB — BASIC METABOLIC PANEL
Anion gap: 8 (ref 5–15)
BUN: 18 mg/dL (ref 8–23)
CO2: 27 mmol/L (ref 22–32)
Calcium: 8.5 mg/dL — ABNORMAL LOW (ref 8.9–10.3)
Chloride: 106 mmol/L (ref 98–111)
Creatinine, Ser: 0.96 mg/dL (ref 0.44–1.00)
GFR calc Af Amer: >60 mL/min (ref >60)
GFR calc non Af Amer: 60 mL/min — ABNORMAL LOW (ref >60)
Glucose, Bld: 106 mg/dL — ABNORMAL HIGH (ref 70–99)
Potassium: 3.9 mmol/L (ref 3.5–5.1)
Sodium: 141 mmol/L (ref 135–145)

## 2019-07-03 LAB — CBC
HCT: 38.1 % (ref 36.0–46.0)
Hemoglobin: 12.3 g/dL (ref 12.0–15.0)
MCH: 31.8 pg (ref 26.0–34.0)
MCHC: 32.3 g/dL (ref 30.0–36.0)
MCV: 98.4 fL (ref 80.0–100.0)
Platelets: 153 10*3/uL (ref 150–400)
RBC: 3.87 MIL/uL (ref 3.87–5.11)
RDW: 13.1 % (ref 11.5–15.5)
WBC: 3.8 10*3/uL — ABNORMAL LOW (ref 4.0–10.5)
nRBC: 0 % (ref 0.0–0.2)

## 2019-07-15 NOTE — Progress Notes (Signed)
This encounter was created in error - please disregard.

## 2019-07-27 ENCOUNTER — Telehealth: Payer: Self-pay

## 2019-07-27 NOTE — Telephone Encounter (Signed)
Telephone call to patient to discuss SCP visit.   Patient in agreement for me to mail her the SCP packet and I will call her 08/10/2019 around 2:00 pm to review packet.   Packet mailed.

## 2019-08-03 ENCOUNTER — Other Ambulatory Visit: Payer: Self-pay

## 2019-08-06 ENCOUNTER — Inpatient Hospital Stay: Payer: Medicare Other | Attending: Hematology and Oncology

## 2019-08-06 ENCOUNTER — Other Ambulatory Visit: Payer: Self-pay

## 2019-08-06 VITALS — Temp 97.8°F | Resp 18

## 2019-08-06 DIAGNOSIS — Z951 Presence of aortocoronary bypass graft: Secondary | ICD-10-CM | POA: Insufficient documentation

## 2019-08-06 DIAGNOSIS — C8339 Diffuse large B-cell lymphoma, extranodal and solid organ sites: Secondary | ICD-10-CM | POA: Insufficient documentation

## 2019-08-06 DIAGNOSIS — Z87891 Personal history of nicotine dependence: Secondary | ICD-10-CM | POA: Insufficient documentation

## 2019-08-06 DIAGNOSIS — Z9221 Personal history of antineoplastic chemotherapy: Secondary | ICD-10-CM | POA: Insufficient documentation

## 2019-08-06 DIAGNOSIS — M3214 Glomerular disease in systemic lupus erythematosus: Secondary | ICD-10-CM | POA: Diagnosis not present

## 2019-08-06 DIAGNOSIS — K219 Gastro-esophageal reflux disease without esophagitis: Secondary | ICD-10-CM | POA: Diagnosis not present

## 2019-08-06 DIAGNOSIS — Z95828 Presence of other vascular implants and grafts: Secondary | ICD-10-CM

## 2019-08-06 DIAGNOSIS — Z452 Encounter for adjustment and management of vascular access device: Secondary | ICD-10-CM | POA: Diagnosis not present

## 2019-08-06 DIAGNOSIS — I129 Hypertensive chronic kidney disease with stage 1 through stage 4 chronic kidney disease, or unspecified chronic kidney disease: Secondary | ICD-10-CM | POA: Insufficient documentation

## 2019-08-06 DIAGNOSIS — Z79899 Other long term (current) drug therapy: Secondary | ICD-10-CM | POA: Insufficient documentation

## 2019-08-06 DIAGNOSIS — E785 Hyperlipidemia, unspecified: Secondary | ICD-10-CM | POA: Insufficient documentation

## 2019-08-06 DIAGNOSIS — J432 Centrilobular emphysema: Secondary | ICD-10-CM | POA: Insufficient documentation

## 2019-08-06 DIAGNOSIS — N189 Chronic kidney disease, unspecified: Secondary | ICD-10-CM | POA: Diagnosis not present

## 2019-08-06 DIAGNOSIS — Z7952 Long term (current) use of systemic steroids: Secondary | ICD-10-CM | POA: Insufficient documentation

## 2019-08-06 MED ORDER — SODIUM CHLORIDE 0.9% FLUSH
10.0000 mL | INTRAVENOUS | Status: DC | PRN
  Administered 2019-08-06: 14:00:00 10 mL via INTRAVENOUS
  Filled 2019-08-06: qty 10

## 2019-08-06 MED ORDER — HEPARIN SOD (PORK) LOCK FLUSH 100 UNIT/ML IV SOLN
500.0000 [IU] | Freq: Once | INTRAVENOUS | Status: AC
  Administered 2019-08-06: 14:00:00 500 [IU] via INTRAVENOUS
  Filled 2019-08-06: qty 5

## 2019-08-09 ENCOUNTER — Other Ambulatory Visit: Payer: Self-pay

## 2019-08-10 ENCOUNTER — Inpatient Hospital Stay (HOSPITAL_BASED_OUTPATIENT_CLINIC_OR_DEPARTMENT_OTHER): Payer: Medicare Other | Admitting: Nurse Practitioner

## 2019-08-10 ENCOUNTER — Encounter: Payer: Self-pay | Admitting: Nurse Practitioner

## 2019-08-10 DIAGNOSIS — C8339 Diffuse large B-cell lymphoma, extranodal and solid organ sites: Secondary | ICD-10-CM

## 2019-08-10 NOTE — Progress Notes (Signed)
Survivorship Care Plan visit completed.  Treatment summary reviewed and previously mailed to patient.  ASCO answers booklet reviewed and given to patient.  CARE program and Cancer Transitions discussed with patient along with other resources cancer center offers to patients and caregivers.  Patient verbalized understanding. 

## 2019-08-10 NOTE — Progress Notes (Signed)
Virtual Visit Progress Note  Survivorship Clinic Consult Note [facility]  [phone] [phone]  I connected with In [name] on 08/10/19 at  2:00 PM EST by telephone visit and verified that I am speaking with the correct person using two identifiers.   I discussed the limitations, risks, security and privacy concerns of performing an evaluation and management service by telemedicine and the availability of in-person appointments. I also discussed with the patient that there may be a patient responsible charge related to this service. The patient expressed understanding and agreed to proceed.   Other persons participating in the visit and their role in the encounter: Magdalene Patricia, RN- reviewing care plan & checking patient in  Patient's location: home  Provider's location: home  Chief Complaint: DLBCL  CLINIC:  Survivorship  REASON FOR VISIT:  Routine follow-up post-treatment for a recent history of non-hodgkins lymphoma  BRIEF ONCOLOGIC HISTORY:  Oncology History Overview Note   [name] is a 74 y.o. 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  IPI is 3 (intermediate-high risk), age-adjusted IPI is 2 (low-intermediate risk), revised IPI is 3 (poor), and NCCN-IPI is 5 (intermediate-high risk).  CNS risk is 3 (intermediate: age, LDH, stage).   EGD on 03/15/2018 revealed pathology from the duodenal scalloped lesion and duodenal sweep ulcer with involvement with diffuse large B cell lymphoma.  Random gastric biopsy was negative for lymphoma.  There was mild chronic gastritis with reactive foveolar hyperplasia.  Gastric fundus scar revealed involvement with diffuse large B cell lymphoma.  GE junction biopsy revealed no lymphoma with squamocolumnar mucosa with mild chronic active inflammation.   The lymphoma has a non-germinal center immunophenotype with a Ki-67 proliferation index of  60% and shows Bcl-2/CMYC co-expression by  immunohistochemical analysis.   Summary of IHC results, neoplastic cells: Positive: PAX-5, CD20(variable), Bcl-2, MUM1, Bcl-6 (variable), CMYC.  Negative: CD10, CD5, CD30, CD138, Cyclin-D1, ALK-1, CD3.  EBV (ISH): Negative.  FISH for MYC gene rearrangement (8q24 gene region) was negative.   PET scan on 04/06/2018 revealed enlarged and hypermetabolic supraclavicular, mediastinal, abdominal and pelvic adenopathy. Additional smaller hypermetabolic lymph nodes identified within the left cervical lymph node chain. Largest lymph node was a 5.7 cm subcarinal lymph node (SUV 26.78).   Bone marrow aspirate and biopsy on 04/12/2018 revealed a slightly hypercellular bone marrow for age with trilineage hematopoiesis.  There were dyspoietic changes associated with the granulocyte series.  Flow cytometry revealed a predominance of T lymphocytes with relative abundance of CD8 + cells.  There was no significant B cell population.  Cytogenetics are pending.   Echo on 04/04/2018 revealed an EF of 55-60%.   She presented with a normocytic anemia.  She was admitted x 2 (12/2017 and 01/2018) with rectal bleeding.  She is intolerant of oral iron.  Diet is modest.   EGD on 12/30/2017 revealed non bleeding gastric erosions in the antrum.  Colonoscopy on 12/30/2017 revealed numerous polyps (one 5 mm in ascending colon, two 7 mm polyps in transverse colon, five 5-7 mm polyps in the descending colon, one 7 mm polyp in the sigmoid colon, one 8 mm polyp in the recto-sigmoid colon, and one 19 mm polyp in the rectum). 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     She developed hematochezia post colonoscopy.  Flexible sigmoidoscopy on 12/31/2017 revealed a rectal ulcer at the site of the polypectomy and blood in the rectum.  Clips  were placed.  EGD on 02/05/2018 revealed 2 bleeding AVMs (treated with APC) in the small bowel with scalloped/denuded mucosa  underneath the bleeding sites.   Work-up on 04/03/2018 revealed a hematocrit of 32.5, hemoglobin 10.7, MCV 91.7, platelets 207,000, WBC 1800 with an ANC of 600.  Uric acid was 8.8.  LDH was 241 (98-192) and beta2-microglobulin  5.8 (0.6-2.4).  Negative studies included:  hepatitis B core antibody total, hepatitis B surface antigen, hepatitis C antibody, HIV testing.      She has required 4 units of PRBCs (3 units in 12/2017 and 1 unit in 01/2018).   Ferritin has been followed: 30 on 12/29/2017, 93 on 02/13/2018, 57 on 03/07/2018, and 38 on 04/03/2018.   Labs on 12/29/2017 revealed the following:  B12 was 254 (low normal).  She has been on oral B12 since 03/07/2018.  B12 was 1524 on 04/03/2018.  Folate 6.3 (> 5.9) on 12/29/2017 and 55.8 on 04/03/2018.     She is s/p 6 cycles of mini-RCHOP (04/24/2018 - 08/31/2018) with growth factor support (Udencya then On-Pro Neulasta).  Cycle #1 platelet nadir was 82,000. Cycle #4 was postponed on 07/13/2018 ([place] 0).    Chest, abdomen, and pelvic CT on 07/03/2018 revealed marked interval response to therapy. Previously identified relatively bulky lymphadenopathy in the chest, abdomen, and pelvis had resolved with no pathologically enlarged lymphadenopathy.  There was no new or progressive interval findings.   PET scan on 09/25/2018 revealing a partial response to chemotherapy.  Bulky mediastinal adenopathy nearly resolved, however a solitary 1.3 cm nodule remained hypermetabolic (SUV 6.7).  There was a 3 mm (previously 2 cm; SUV 18) retroperitoneal lymph node adjacent to the celiac trunk with no associated metabolic activity.   She has chronic renal insufficiency.  Creatinine was 1.42 (CrCl 25 ml/min) on 04/03/2018.   She has lupus (diagnosed 1982). She is on prednisone 5 mg a day and Plaquenil.    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 (HCC)  04/03/2018 Initial Diagnosis   Diffuse large B-cell lymphoma of  extranodal site excluding spleen and other solid organs (HCC)   04/24/2018 -  Chemotherapy   The patient had DOXOrubicin (ADRIAMYCIN) chemo injection 34 mg, 25 mg/m2 = 34 mg (50 % of original dose 50 mg/m2), Intravenous,  Once, 6 of 7 cycles Dose modification: 25 mg/m2 (original dose 50 mg/m2, Cycle 1, Reason: Provider Judgment, Comment: advance dose as tolerated), 25 mg/m2 (original dose 50 mg/m2, Cycle 3, Reason: Provider Judgment, Comment: mini-RCHOP) Administration: 34 mg (04/24/2018), 34 mg (05/15/2018), 34 mg (06/07/2018), 34 mg (07/21/2018), 34 mg (08/10/2018), 34 mg (08/31/2018) palonosetron (ALOXI) injection 0.25 mg, 0.25 mg, Intravenous,  Once, 6 of 7 cycles Administration: 0.25 mg (04/24/2018), 0.25 mg (05/15/2018), 0.25 mg (06/07/2018), 0.25 mg (07/21/2018), 0.25 mg (08/10/2018), 0.25 mg (08/31/2018) pegfilgrastim (NEULASTA ONPRO KIT) injection 6 mg, 6 mg, Subcutaneous, Once, 3 of 4 cycles Administration: 6 mg (07/21/2018), 6 mg (08/10/2018), 6 mg (08/31/2018) pegfilgrastim-cbqv (UDENYCA) injection 6 mg, 6 mg, Subcutaneous, Once, 3 of 3 cycles Administration: 6 mg (04/25/2018), 6 mg (05/16/2018), 6 mg (06/08/2018) vinCRIStine (ONCOVIN) 1 mg in sodium chloride 0.9 % 50 mL chemo infusion, 1 mg (50 % of original dose 2 mg), Intravenous,  Once, 6 of 7 cycles Dose modification: 1 mg (original dose 2 mg, Cycle 1, Reason: Provider Judgment, Comment: advance dose as tolerated), 1 mg (original dose 2 mg, Cycle 3, Reason: Provider Judgment, Comment: mini-RCHOP) Administration: 1 mg (04/24/2018), 1 mg (05/15/2018), 1  mg (06/07/2018), 1 mg (07/21/2018), 1 mg (08/10/2018), 1 mg (08/31/2018) riTUXimab (RITUXAN) 500 mg in sodium chloride 0.9 % 250 mL (1.6667 mg/mL) infusion, 375 mg/m2 = 500 mg, Intravenous,  Once, 6 of 7 cycles Administration: 500 mg (04/24/2018) cyclophosphamide (CYTOXAN) 560 mg in sodium chloride 0.9 % 250 mL chemo infusion, 400 mg/m2 = 560 mg (53.3 % of original dose 750 mg/m2), Intravenous,  Once, 6 of  7 cycles Dose modification: 400 mg/m2 (original dose 750 mg/m2, Cycle 1, Reason: Provider Judgment, Comment: advance dose as tolerated), 400 mg/m2 (original dose 750 mg/m2, Cycle 3, Reason: Provider Judgment, Comment: mini-RCHOP) Administration: 560 mg (04/24/2018), 560 mg (05/15/2018), 560 mg (06/07/2018), 560 mg (07/21/2018), 560 mg (08/10/2018), 560 mg (08/31/2018)  for chemotherapy treatment.      INTERVAL HISTORY: Patient presents to the survivorship clinic today for initial meeting to review her survivorship care plan detailing treatment course for diagnosis of non-hodgkins lymphoma as well as monitoring for long-term side effects of that treatment, education regarding health maintenance, screening, and overall wellness and health maintenance and promotion.  Since completing treatment she reports feeling well and denies specific complaints.  Past medical, surgical, family, social history were updated and reviewed as below.  Allergies and current medications were also reviewed and updated as below.   REVIEW OF SYSTEMS:  Review of Systems  Constitutional: Negative for appetite change, fatigue and unexpected weight change.  HENT:   Negative for mouth sores, sore throat and trouble swallowing.   Respiratory: Negative for chest tightness and shortness of breath.   Cardiovascular: Negative for leg swelling.  Gastrointestinal: Negative for abdominal pain, constipation, diarrhea, nausea and vomiting.  Genitourinary: Negative for bladder incontinence and dysuria.   Musculoskeletal: Negative for flank pain and neck stiffness.  Skin: Negative for itching, rash and wound.  Neurological: Negative for dizziness, headaches, light-headedness and numbness.  Psychiatric/Behavioral: Negative for confusion, depression and sleep disturbance. The patient is not nervous/anxious.     ONCOLOGY TREATMENT TEAM:  1. Medical Oncologist: Dr. Mike Gip   PAST MEDICAL/SURGICAL HISTORY:  Past Medical History:    Diagnosis Date  . Cancer [facility])    Stomach cancer  . Chronic kidney disease   . GERD (gastroesophageal reflux disease)   . HTN (hypertension)   . Hyperlipidemia   . Lupus ([place])   . S/P angioplasty with stent    Past Surgical History:  Procedure Laterality Date  . CARDIAC SURGERY    . CESAREAN SECTION    . COLONOSCOPY N/A 12/30/2017   Procedure: COLONOSCOPY;  Surgeon: Lin Landsman, MD;  Location: [facility] ENDOSCOPY;  Service: Gastroenterology;  Laterality: N/A;  . CORONARY ARTERY BYPASS GRAFT  2003  . ESOPHAGOGASTRODUODENOSCOPY N/A 12/30/2017   Procedure: ESOPHAGOGASTRODUODENOSCOPY (EGD);  Surgeon: Lin Landsman, MD;  Location: [facility] ENDOSCOPY;  Service: Gastroenterology;  Laterality: N/A;  . ESOPHAGOGASTRODUODENOSCOPY Left 02/05/2018   Procedure: ESOPHAGOGASTRODUODENOSCOPY (EGD);  Surgeon: Virgel Manifold, MD;  Location: [facility] ENDOSCOPY;  Service: Endoscopy;  Laterality: Left;  . ESOPHAGOGASTRODUODENOSCOPY (EGD) WITH PROPOFOL N/A 03/15/2018   Procedure: ESOPHAGOGASTRODUODENOSCOPY (EGD) WITH PROPOFOL;  Surgeon: Virgel Manifold, MD;  Location: [facility] ENDOSCOPY;  Service: Endoscopy;  Laterality: N/A;  . FLEXIBLE SIGMOIDOSCOPY N/A 12/31/2017   Procedure: FLEXIBLE SIGMOIDOSCOPY;  Surgeon: Jonathon Bellows, MD;  Location: [facility] ENDOSCOPY;  Service: Gastroenterology;  Laterality: N/A;  . PORTA CATH INSERTION N/A 04/19/2018   Procedure: PORTA CATH INSERTION;  Surgeon: Algernon Huxley, MD;  Location: [facility] CV LAB;  Service: Cardiovascular;  Laterality: N/A;     ALLERGIES:  Allergies  Allergen Reactions  . Penicillins Other (See Comments)    Pass out Has patient had a PCN reaction causing immediate rash, facial/tongue/throat swelling, SOB or lightheadedness with hypotension: Yes Has patient had a PCN reaction causing severe rash involving mucus membranes or skin necrosis: No Has patient had a PCN reaction that required hospitalization: No Has patient had a PCN reaction occurring  within the last 10 years: No If all of the above answers are "NO", then may proceed with Cephalosporin use.   . Sulfa Antibiotics Nausea Only  . Sulfasalazine Nausea And Vomiting       . Ace Inhibitors Nausea Only  . Losartan Anxiety  . Spironolactone Other (See Comments)    Felt poorly but couldn't clarify     CURRENT MEDICATIONS:  Outpatient Encounter Medications as of 08/10/2019  Medication Sig Note  . acetaminophen (TYLENOL) 500 MG tablet Take 1,000 mg by mouth 2 (two) times daily as needed for moderate pain or headache.   . alendronate (FOSAMAX) 70 MG tablet Take 70 mg by mouth every Saturday.    Marland Kitchen allopurinol (ZYLOPRIM) 100 MG tablet TAKE 1/2 TABLET BY MOUTH EVERY DAY   . amLODipine (NORVASC) 10 MG tablet Take 10 mg by mouth daily.   . clotrimazole-betamethasone (LOTRISONE) cream Apply 1 application topically 2 (two) times daily as needed (rash).    Marland Kitchen dexamethasone (DECADRON) 4 MG tablet Take 1 tablet (4 mg total) by mouth daily. (Patient not taking: Reported on 03/22/2019)   . feeding supplement, ENSURE ENLIVE, (ENSURE ENLIVE) LIQD Take 237 mLs by mouth 2 (two) times daily between meals. (Patient not taking: Reported on 03/22/2019)   . ferrous sulfate 325 (65 FE) MG tablet Take 1 tablet (325 mg total) by mouth 2 (two) times daily with a meal.   . fluticasone (FLONASE) 50 MCG/ACT nasal spray Place 1 spray into both nostrils daily as needed for allergies or rhinitis.   Marland Kitchen HYDROcodone-acetaminophen (NORCO/VICODIN) 5-325 MG tablet Take by mouth.   . hydroxychloroquine (PLAQUENIL) 200 MG tablet Take 200 mg by mouth 2 (two) times daily.    Marland Kitchen lidocaine-prilocaine (EMLA) cream Apply to affected area once (Patient not taking: Reported on 06/22/2019)   . Multiple Vitamin (MULTIVITAMIN WITH MINERALS) TABS tablet Take 1 tablet by mouth daily.   . ondansetron (ZOFRAN) 4 MG tablet Take 1 tablet (4 mg total) by mouth 2 (two) times daily as needed for refractory nausea / vomiting. Start on day 3 after  cyclophosphamide chemotherapy. (Patient not taking: Reported on 06/22/2019)   . polyethylene glycol (MIRALAX / GLYCOLAX) packet Take 17 g by mouth daily.   . predniSONE (DELTASONE) 5 MG tablet Take 5 mg by mouth daily with breakfast.  04/18/2018: scheduled  . Pseudoephedrine-APAP 30-325 MG TABS Take 1 tablet by mouth daily as needed (sinuses).    . ranitidine (ZANTAC) 150 MG tablet Take 150 mg by mouth daily as needed for heartburn.    . simvastatin (ZOCOR) 40 MG tablet Take 40 mg by mouth daily.    . sucralfate (CARAFATE) 1 g tablet TAKE 1 TABLET BY MOUTH 3 (THREE) TIMES DAILY. DISSOLVE IN 3-4 TBSP WARM WATER, SWISH AND SWALLOW   . traMADol (ULTRAM) 50 MG tablet Take 50 mg by mouth 2 (two) times daily as needed for moderate pain.     No facility-administered encounter medications on file as of 08/10/2019.    ONCOLOGIC FAMILY HISTORY:  Family History  Problem Relation Age of Onset  . Hypertension Mother   . Stroke Mother   .  Vascular Disease Mother   . Hypertension Sister     SOCIAL HISTORY:  Social History   Socioeconomic History  . Marital status: Married    Spouse name: Not on file  . Number of children: Not on file  . Years of education: Not on file  . Highest education level: Not on file  Occupational History  . Not on file  Tobacco Use  . Smoking status: Former Smoker    Packs/day: 0.50    Years: 20.00    Pack years: 10.00    Types: Cigarettes    Quit date: 03/07/2002    Years since quitting: 17.4  . Smokeless tobacco: Never Used  Substance and Sexual Activity  . Alcohol use: Not Currently  . Drug use: Never  . Sexual activity: Not on file  Other Topics Concern  . Not on file  Social History Narrative  . Not on file   Social Determinants of Health   Financial Resource Strain:   . Difficulty of Paying Living Expenses: Not on file  Food Insecurity:   . Worried About Charity fundraiser in the Last Year: Not on file  . Ran Out of Food in the Last Year: Not on  file  Transportation Needs:   . Lack of Transportation (Medical): Not on file  . Lack of Transportation (Non-Medical): Not on file  Physical Activity:   . Days of Exercise per Week: Not on file  . Minutes of Exercise per Session: Not on file  Stress:   . Feeling of Stress : Not on file  Social Connections:   . Frequency of Communication with Friends and Family: Not on file  . Frequency of Social Gatherings with Friends and Family: Not on file  . Attends Religious Services: Not on file  . Active Member of Clubs or Organizations: Not on file  . Attends Archivist Meetings: Not on file  . Marital Status: Not on file     PHYSICAL EXAMINATION:  Exam Limited due to telemedicine  LABORATORY DATA:   06/20/2019 LDH 236  DIAGNOSTIC IMAGING:  PET 03/20/2019- PET for restaging 1. Further improvement in hypermetabolic activity associated with a small residual precarinal lymph node which has decreased in size compared with the prior PET-CT of 6 months ago. Still [place] 4. 2. No other residual or recurrent hypermetabolic nodal activity in the neck, chest, abdomen or pelvis ([place] 1). 3. Resolving radiation changes in both lungs. 4. Stable incidental findings including extensive atherosclerosis and moderate centrilobular emphysema.  ASSESSMENT AND PLAN:  Ms.. Torres is a pleasant 74 y.o. female diagnosed with stage IVb diffuse large B-cell lymphoma treated with 6 cycles of mini R-CHOP with partial response followed by involved field radiation to the mediastinum completed on 12/04/2018.  PET scan on 03/20/2019 revealed ongoing improvement to the mediastinal lymph nodes hypermetabolic activity.  No evidence of recurrent disease in the neck, chest, abdomen, pelvis.  She presents to survivorship clinic for initial meeting and discussion of late and long-term effects of treatment for non-Hodgkin's lymphoma.  1.  Non-Hodgkin's lymphoma:  Ms. Maxson is continuing to recover from  treatment for non-Hodgkin's lymphoma.  Today, NCCN guidelines were reviewed for continued surveillance of non-Hodgkin's lymphoma which may include physical exam with blood work and imaging as clinically indicated.  We discussed that the goal of these visits is to detect any recurrence as early as possible and that the purpose of the surveillance visits is to look for signs that the cancer may have come  back.  I advised that if patient becomes symptomatic prior to these visits, to please call the cancer center so that a visit can be arranged sooner. Today, a comprehensive survivorship care plan and treatment summary was reviewed with the patient detailing diagnosis, treatment course, potential late and long-term side effects of treatments, appropriate follow-up care with recommendations for the future, and patient education resources.  A copy of this care plan, along with a letter will be sent to the patient's primary care provider via mail/\in basket.  Port was placed for administration of chemotherapy. We discussed flushing/maintenance schedule today. I advised that during covid-19 pandemic we could extend flushes to once every 12 weeks. Flush scheduled managed by primary oncology group.   2.  Reducing risk of cancer recurrence: Today, we discussed the importance of adopting healthy behaviors such as not smoking, eating well, getting regular physical activity, and staying at a healthy weight might reduce the risk of lymphoma recurrence but unfortunately it is not yet clear if there are things that will help.  However, we know that many of these have positive effects on your health that can extend beyond risk of lymphoma or other cancers.  We discussed that a normal BMI is 18.5-24.9 kg/m.  3. Tobacco Avoidance:  We discussed that smoking increases the risk of many cancers including some of the second cancers seen in people who have had lymphoma.  Patient is currently tobacco free and is committed to staying  so. She is a former smoker and does not qualify for LDCT screening program.   4. Cancer screening: We discussed the importance of regular cancer screenings and I advised patient that lymphoma survivors do have a somewhat higher risk of second cancers as opposed to the general population.  We discussed that lymphoma survivors have an increased risk of: Melanoma, lung cancer, kidney cancer, colon cancer, cancers of the head and neck, thyroid cancer, bone and soft tissue cancers, leukemia and myelodysplastic syndrome/MDS, bladder cancer, Hodgkin disease, and Kaposi sarcoma.  Information and recommendations are listed in the patient's comprehensive care plan/treatment summary and were reviewed with the patient in detail.  I encouraged patient to maintain a relationship with a primary care provider for continued cancer screenings as the screening recommendations frequently change and a primary care provider is most up-to-date on these recommendations.  5. Health maintenance and wellness promotion: Ms. Sochacki was encouraged to consume 5-7 servings of fruits and vegetables per day. We reviewed the "Nutrition Rainbow" handout, as well as the handout "Take Control of Your Health and Reduce Your Cancer Risk" from the [employer].  She was also encouraged to engage in moderate to vigorous exercise for 30 minutes per day most days of the week. We discussed the Care Program, which is designed for cancer survivors to help them become more physically fit after cancer treatments. I advised that the Care program is currently on hold due to covid 19 pandemic but can consider referral in the future if she is interested.  I advised to limit alcohol consumption and abstain from tobacco use as this has positive effects on overall health and well being.      6. Support services/counseling: It is not uncommon for this period of the patient's cancer care trajectory to be one of many emotions and stressors.  We discussed  the Cancer Transitions program which is a group series designed for patients after they have completed treatment.  Patient was encouraged to take advantage of her many support services and  groups and/or counseling to cope in life is a cancer survivor.  Support was provided today through active listening and expressive supportive counseling.  Information regarding the support services was provided and patient was encouraged to contact me with any questions or to provide assistance in enrolling in these programs.  Dispo:   -Return to cancer center for follow up with Dr. Mike Gip as scheduled in March 2021.   The patient was provided an opportunity to ask questions and all were answered. The patient agreed with the plan and demonstrated an understanding of the instructions.   I provided 15 minutes of non face-to-face telephone visit time during this encounter, and > 50% was spent counseling as documented under my assessment & plan.  Beckey Rutter, DNP AGNP-C Survivorship Program [facility] [place]  Note: PRIMARY CARE PROVIDER [name], [place] [phone] Dr. Mike Gip

## 2019-08-13 DIAGNOSIS — C8339 Diffuse large B-cell lymphoma, extranodal and solid organ sites: Secondary | ICD-10-CM

## 2019-08-13 DIAGNOSIS — C83398 Diffuse large b-cell lymphoma of other extranodal and solid organ sites: Secondary | ICD-10-CM

## 2019-09-13 NOTE — Progress Notes (Signed)
[facility]  [address] [place], [place] [address] Phone: [phone]  Fax: [phone]   Clinic Day:  09/17/2019  Referring physician: [name], MD  Chief Complaint: Judy Torres is a 74 y.o. 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  HPI: The patient was last seen Judy the medical oncology clinic on 06/22/2019 via telemedicine. At that time, she denied any fevers, sweats or weight loss. She denied any adenopathy, bruising or bleeding or interval infections. She denied any symptoms of CHF. EF was declining. Hematocrit was 38.2, hemoglobin 12.2, platelets 161,000, WBC 5,100. Ferritin was 123. Potassium 3.2 and calcium 8.4.  LDH 236. Uric acid was 5.3. We discussed oral potassium and dietary modifications.   BMP on 07/03/2019 showed potassium 3.9, calcium 8.5.   She was seen by Beckey Rutter, NP on 08/10/2019 for an initial meeting to review her survivorship care plan and treatment course. She was educated on health maintenance, screening, and overall wellness and health maintenance and promotion.  The patient was seen Judy nephrology at [facility] by Dr. Marian Sorrow on 07/23/2019 for a follow up.  Her lupus was doing well. She denied any rashes, oral or nasal ulcers. She had no chest pain or pleuritic pain. Her dyspnea on exertion was unchanged. She had muscle and joint pain Judy her foot associated with feet and leg swelling. She reported her urine was more "bubbly" for 3 months. She was started on Lasix 40 mg per day. She will follow up Judy 11/2019.   She had a telephone encounter with Dr. Marian Sorrow on 08/16/2019. She had increased proteinuria.  A renal biopsy was recommended to evaluate lupus activity.  She declined biopsy. Dr. Percell Miller will continue to closely monitor the patient's proteinuria and renal function.    During the interim, she has felt "ok".  She is eating well. She has mild lower extremity  edema. She notes night time frequency. She denies any cough or shortness of breath. She will be seen by Dr. Jefm Bryant for her lupus on 09/20/2019. She was last seen by Dr. Nehemiah Massed.  They would like a referral to a new cardiologist, Dr. Rockey Situ.    Past Medical History:  Diagnosis Date  . Cancer [employer])    Stomach cancer  . Chronic kidney disease   . GERD (gastroesophageal reflux disease)   . HTN (hypertension)   . Hyperlipidemia   . Lupus ([place])   . S/P angioplasty with stent     Past Surgical History:  Procedure Laterality Date  . CARDIAC SURGERY    . CESAREAN SECTION    . COLONOSCOPY N/A 12/30/2017   Procedure: COLONOSCOPY;  Surgeon: Lin Landsman, MD;  Location: [facility] ENDOSCOPY;  Service: Gastroenterology;  Laterality: N/A;  . CORONARY ARTERY BYPASS GRAFT  2003  . ESOPHAGOGASTRODUODENOSCOPY N/A 12/30/2017   Procedure: ESOPHAGOGASTRODUODENOSCOPY (EGD);  Surgeon: Lin Landsman, MD;  Location: [facility] ENDOSCOPY;  Service: Gastroenterology;  Laterality: N/A;  . ESOPHAGOGASTRODUODENOSCOPY Left 02/05/2018   Procedure: ESOPHAGOGASTRODUODENOSCOPY (EGD);  Surgeon: Virgel Manifold, MD;  Location: [facility] ENDOSCOPY;  Service: Endoscopy;  Laterality: Left;  . ESOPHAGOGASTRODUODENOSCOPY (EGD) WITH PROPOFOL N/A 03/15/2018   Procedure: ESOPHAGOGASTRODUODENOSCOPY (EGD) WITH PROPOFOL;  Surgeon: Virgel Manifold, MD;  Location: [facility] ENDOSCOPY;  Service: Endoscopy;  Laterality: N/A;  . FLEXIBLE SIGMOIDOSCOPY N/A 12/31/2017   Procedure: FLEXIBLE SIGMOIDOSCOPY;  Surgeon: Jonathon Bellows, MD;  Location: [facility] ENDOSCOPY;  Service: Gastroenterology;  Laterality: N/A;  .  PORTA CATH INSERTION N/A 04/19/2018   Procedure: PORTA CATH INSERTION;  Surgeon: Algernon Huxley, MD;  Location: [place] CV LAB;  Service: Cardiovascular;  Laterality: N/A;    Family History  Problem Relation Age of Onset  . Hypertension Mother   . Stroke Mother   . Vascular Disease Mother   . Hypertension Sister     Social  History:  reports that she quit smoking about 17 years ago. Her smoking use included cigarettes. She has a 10.00 pack-year smoking history. She has never used smokeless tobacco. She reports previous alcohol use. She reports that she does not use drugs.  She has smoked 1/2 pack/day x 20 years. She stopped smoking Judy 2000. She denies any exposure to radiation or toxins. She is from [place]. She moved to the [place] 52 years ago after marrying her husband. She was Judy the [employer]. Her husband phone([phone]). The patient is accompanied by her husband today.  Allergies:  Allergies  Allergen Reactions  . Penicillins Other (See Comments)    Pass out Has patient had a PCN reaction causing immediate rash, facial/tongue/throat swelling, SOB or lightheadedness with hypotension: Yes Has patient had a PCN reaction causing severe rash involving mucus membranes or skin necrosis: No Has patient had a PCN reaction that required hospitalization: No Has patient had a PCN reaction occurring within the last 10 years: No If all of the above answers are "NO", then may proceed with Cephalosporin use.   . Sulfa Antibiotics Nausea Only  . Sulfasalazine Nausea And Vomiting       . Ace Inhibitors Nausea Only  . Losartan Anxiety  . Spironolactone Other (See Comments)    Felt poorly but couldn't clarify     Current Medications: Current Outpatient Medications  Medication Sig Dispense Refill  . amLODipine (NORVASC) 10 MG tablet Take 10 mg by mouth daily.    . Calcium Carb-Cholecalciferol (CALCIUM 600-D PO) Take 600 mg by mouth daily.    . furosemide (LASIX) 40 MG tablet Take 40 mg by mouth daily.    . hydroxychloroquine (PLAQUENIL) 200 MG tablet Take 200 mg by mouth 2 (two) times daily.     . polyethylene glycol (MIRALAX / GLYCOLAX) packet Take 17 g by mouth daily.    . predniSONE (DELTASONE) 5 MG tablet Take 5 mg by mouth daily with breakfast.     . simvastatin (ZOCOR) 40 MG tablet Take 40 mg by mouth  daily.     . vitamin B-12 (CYANOCOBALAMIN) 500 MCG tablet Take 500 mcg by mouth daily.    Marland Kitchen acetaminophen (TYLENOL) 500 MG tablet Take 1,000 mg by mouth 2 (two) times daily as needed for moderate pain or headache.    . alendronate (FOSAMAX) 70 MG tablet Take 70 mg by mouth every Saturday.     Marland Kitchen allopurinol (ZYLOPRIM) 100 MG tablet TAKE 1/2 TABLET BY MOUTH EVERY DAY (Patient not taking: Reported on 09/17/2019) 45 tablet 1  . aspirin 81 MG EC tablet Take by mouth.    . clotrimazole-betamethasone (LOTRISONE) cream Apply 1 application topically 2 (two) times daily as needed (rash).     Marland Kitchen dexamethasone (DECADRON) 4 MG tablet Take 1 tablet (4 mg total) by mouth daily. (Patient not taking: Reported on 03/22/2019) 30 tablet 0  . feeding supplement, ENSURE ENLIVE, (ENSURE ENLIVE) LIQD Take 237 mLs by mouth 2 (two) times daily between meals. (Patient not taking: Reported on 03/22/2019) 90 Bottle 0  . ferrous sulfate 325 (65 FE) MG EC tablet Take  by mouth.    . ferrous sulfate 325 (65 FE) MG tablet Take 1 tablet (325 mg total) by mouth 2 (two) times daily with a meal. (Patient not taking: Reported on 09/17/2019) 60 tablet 3  . fluticasone (FLONASE) 50 MCG/ACT nasal spray Place 1 spray into both nostrils daily as needed for allergies or rhinitis.    Marland Kitchen HYDROcodone-acetaminophen (NORCO/VICODIN) 5-325 MG tablet Take by mouth.    . lidocaine-prilocaine (EMLA) cream Apply to affected area once (Patient not taking: Reported on 06/22/2019) 30 g 3  . Multiple Vitamin (MULTIVITAMIN WITH MINERALS) TABS tablet Take 1 tablet by mouth daily. 180 tablet 0  . ondansetron (ZOFRAN) 4 MG tablet Take 1 tablet (4 mg total) by mouth 2 (two) times daily as needed for refractory nausea / vomiting. Start on day 3 after cyclophosphamide chemotherapy. (Patient not taking: Reported on 06/22/2019) 30 tablet 3  . Pseudoephedrine-APAP 30-325 MG TABS Take 1 tablet by mouth daily as needed (sinuses).     . ranitidine (ZANTAC) 150 MG tablet Take 150 mg  by mouth daily as needed for heartburn.     . sucralfate (CARAFATE) 1 g tablet TAKE 1 TABLET BY MOUTH 3 (THREE) TIMES DAILY. DISSOLVE Judy 3-4 TBSP WARM WATER, SWISH AND SWALLOW (Patient not taking: Reported on 09/17/2019) 270 tablet 2  . traMADol (ULTRAM) 50 MG tablet Take 50 mg by mouth 2 (two) times daily as needed for moderate pain.     . Vitamins/Minerals TABS Take by mouth.     No current facility-administered medications for this visit.    Review of Systems  Constitutional: Negative.  Negative for chills, diaphoresis, fever, malaise/fatigue and weight loss (up 9 lbs).       Feels "ok".  HENT: Negative.  Negative for congestion, ear discharge, ear pain, hearing loss, nosebleeds, sinus pain and sore throat.   Eyes: Negative.  Negative for blurred vision, double vision, photophobia and pain.  Respiratory: Negative.  Negative for cough (seasonal), hemoptysis, sputum production and shortness of breath.   Cardiovascular: Positive for leg swelling (chronic BILATERAL ankles; mild). Negative for chest pain, palpitations, orthopnea and PND.       Serial echoes reveal declining EF.  Gastrointestinal: Negative.  Negative for abdominal pain, blood Judy stool, constipation, diarrhea, heartburn, melena, nausea and vomiting.       Eating well.  Genitourinary: Positive for frequency (night time). Negative for dysuria, hematuria and urgency.  Musculoskeletal: Negative.  Negative for back pain, falls, joint pain, myalgias and neck pain.  Skin: Negative.  Negative for itching and rash.  Neurological: Negative.  Negative for dizziness, tingling, tremors, sensory change, speech change, focal weakness, weakness and headaches.  Endo/Heme/Allergies: Negative.  Does not bruise/bleed easily.  Psychiatric/Behavioral: Negative.  Negative for depression and memory loss. The patient is not nervous/anxious and does not have insomnia.   All other systems reviewed and are negative.  Performance status (ECOG):   0  Vitals Blood pressure 132/65, pulse 86, temperature (!) 97.3 F (36.3 C), temperature source Tympanic, resp. rate 18, height 4' 11" (1.499 m), weight 123 lb 10.9 oz (56.1 kg), SpO2 100 %.   Physical Exam  Constitutional: She is oriented to person, place, and time. She appears well-developed and well-nourished. No distress.  HENT:  Head: Normocephalic and atraumatic.  Mouth/Throat: Oropharynx is clear and moist. No oropharyngeal exudate.  Short styled dark hair.  Dentures.  Mask.  Eyes: Pupils are equal, round, and reactive to light. Conjunctivae and EOM are normal. No scleral icterus.  Glasses.  Brown eyes.  Neck: No JVD present.  Cardiovascular: Normal rate, regular rhythm and normal heart sounds.  No murmur heard. Pulmonary/Chest: Effort normal and breath sounds normal. No respiratory distress. She has no wheezes. She has no rales. She exhibits no tenderness.  Abdominal: Soft. Normal appearance and bowel sounds are normal. She exhibits no distension and no mass. There is no abdominal tenderness. There is no rebound and no guarding.  Musculoskeletal:        General: Edema (1+ left ankle) present. No tenderness. Normal range of motion.     Cervical back: Normal range of motion and neck supple.  Lymphadenopathy:       Head (right side): No preauricular, no posterior auricular and no occipital adenopathy present.       Head (left side): No preauricular, no posterior auricular and no occipital adenopathy present.    She has no cervical adenopathy.    She has no axillary adenopathy.       Right: No inguinal and no supraclavicular adenopathy present.       Left: No inguinal and no supraclavicular adenopathy present.  Neurological: She is alert and oriented to person, place, and time.  Skin: Skin is warm and dry. No rash noted. She is not diaphoretic. No erythema. No pallor.  Psychiatric: She has a normal mood and affect. Her behavior is normal. Judgment and thought content normal.   Nursing note and vitals reviewed.   Imaging studies: 04/06/2018:PET scanrevealed enlarged and hypermetabolic supraclavicular, mediastinal, abdominal and pelvic adenopathy. Additional smaller hypermetabolic lymph nodes identified within the left cervical lymph node chain. Largest lymph node was a 5.7 cm subcarinal lymph node (SUV 26.78). 07/03/2018:Chest, abdomen, and pelvic CTrevealed marked interval response to therapy. Previously identified relatively bulky lymphadenopathy Judy the chest, abdomen, and pelvis had resolved with no pathologically enlarged lymphadenopathy. There was no new or progressive interval findings. 09/25/2018:PET scanrevealed partial response to chemotherapy. The bulky intensely hypermetabolic mediastinal adenopathy was near completely resolved; however a solitary 1.3 cm node remained with intense radiotracer activity (Deauville 4). There was complete metabolic response and size reduction of cervical lymph nodes, retroperitoneal periaortic nodes and pelvic nodes (Deauville 1). 01/09/2019:ChestCTwithout contrastrevealed severe bilateral paramediastinal radiation fibrosis changes.There was underlying emphysematous changes and pulmonary scarring. There were no findings suspicious for pulmonary metastatic disease. There was interval decrease Judy size of the precarinal lymph node when compared to prior study. There was stable advanced atherosclerotic calcifications involving the thoracic and upper abdominal aorta and branch vessels including dense three-vessel coronary artery calcifications. 03/20/2019:PET scanrevealed further improvement Judy hypermetabolic activity associated with a small residual pre-carinal lymph node which haddecreased Judy size compared with the prior PET-CT of 6 months ago (still Deauville 4). There was no other residual or recurrent hypermetabolic nodal activity Judy the neck, chest, abdomen or pelvis (Deauville 1).There was resolving radiation changes  Judy both lungs.There was stableextensive atherosclerosis and moderate centrilobular emphysema.   Infusion on 09/17/2019  Component Date Value Ref Range Status  . LDH 09/17/2019 221* 98 - 192 U/L Final   Performed at [facility], [address] [place], [place] [address]  . Uric Acid, Serum 09/17/2019 7.6* 2.5 - 7.1 mg/dL Final   Performed at [facility], [address] [place], [place] [address]  . Sodium 09/17/2019 138  135 - 145 mmol/L Final  . Potassium 09/17/2019 3.6  3.5 - 5.1 mmol/L Final  . Chloride 09/17/2019 106  98 - 111 mmol/L Final  . CO2 09/17/2019 23  22 - 32 mmol/L Final  . Glucose, Bld 09/17/2019 114* 70 - 99 mg/dL Final   Glucose reference range applies only to samples taken after fasting for at least 8 hours.  . BUN 09/17/2019 22  8 - 23 mg/dL Final  . Creatinine, Ser 09/17/2019 0.98  0.44 - 1.00 mg/dL Final  . Calcium 09/17/2019 8.7* 8.9 - 10.3 mg/dL Final  . Total Protein 09/17/2019 6.6  6.5 - 8.1 g/dL Final  . Albumin 09/17/2019 4.1  3.5 - 5.0 g/dL Final  . AST 09/17/2019 27  15 - 41 U/L Final  . ALT 09/17/2019 16  0 - 44 U/L Final  . Alkaline Phosphatase 09/17/2019 70  38 - 126 U/L Final  . Total Bilirubin 09/17/2019 0.6  0.3 - 1.2 mg/dL Final  . GFR calc non Af Amer 09/17/2019 58* >60 mL/min Final  . GFR calc Af Amer 09/17/2019 >60  >60 mL/min Final  . Anion gap 09/17/2019 9  5 - 15 Final   Performed at [facility], [address], [place], [place] [address]  . WBC 09/17/2019 7.3  4.0 - 10.5 K/uL Final  . RBC 09/17/2019 3.82* 3.87 - 5.11 MIL/uL Final  . Hemoglobin 09/17/2019 12.0  12.0 - 15.0 g/dL Final  . HCT 09/17/2019 37.4  36.0 - 46.0 % Final  . MCV 09/17/2019 97.9  80.0 - 100.0 fL Final  . MCH 09/17/2019 31.4  26.0 - 34.0 pg Final  . MCHC 09/17/2019 32.1  30.0 - 36.0 g/dL Final  . RDW 09/17/2019 13.0  11.5 - 15.5 % Final  . Platelets 09/17/2019 198  150 - 400 K/uL Final  . nRBC 09/17/2019 0.0  0.0 - 0.2 %  Final  . Neutrophils Relative % 09/17/2019 72  % Final  . Neutro Abs 09/17/2019 5.3  1.7 - 7.7 K/uL Final  . Lymphocytes Relative 09/17/2019 20  % Final  . Lymphs Abs 09/17/2019 1.4  0.7 - 4.0 K/uL Final  . Monocytes Relative 09/17/2019 6  % Final  . Monocytes Absolute 09/17/2019 0.5  0.1 - 1.0 K/uL Final  . Eosinophils Relative 09/17/2019 1  % Final  . Eosinophils Absolute 09/17/2019 0.1  0.0 - 0.5 K/uL Final  . Basophils Relative 09/17/2019 0  % Final  . Basophils Absolute 09/17/2019 0.0  0.0 - 0.1 K/uL Final  . Immature Granulocytes 09/17/2019 1  % Final  . Abs Immature Granulocytes 09/17/2019 0.04  0.00 - 0.07 K/uL Final   Performed at [facility], [address] [place], [place] [address]    Assessment:  Judy Judy Torres is a 74 y.o. female with stage IVBdiffuse large B cell lymphomainvolving the gastric fundus and duodenum. IPI is 3 (intermediate-high risk), age-adjusted IPI is 2 (low-intermediate risk), revised IPI is 3 (poor), and NCCN-IPI is 5 (intermediate-high risk). CNS risk is 3 (intermediate: age, LDH, stage).  EGDon 03/15/2018 revealed pathology from the duodenal scalloped lesion and duodenal sweep ulcerwith involvement with diffuse large B cell lymphoma. Random gastric biopsy was negative for lymphoma. There was mild chronic gastritis with reactive foveolar hyperplasia. Gastric fundus scarrevealed involvement with diffuse large B cell lymphoma. GE junction biopsy revealed no lymphoma with squamocolumnar mucosa with mild chronic active inflammation.  The lymphoma has a non-germinal center immunophenotype with a Ki-67 proliferation index of 60% and shows Bcl-2/CMYC co-expression by immunohistochemical analysis. Summary of IHC results, neoplastic cells: Positive: PAX-5, CD20(variable), Bcl-2, MUM1, Bcl-6 (variable), CMYC. Negative: CD10, CD5, CD30, CD138, Cyclin-D1, ALK-1, CD3. EBV (ISH):  Negative. FISH for MYCgene rearrangement (8q24 gene region)  was negative.  PET scanon 04/06/2018 revealed enlarged and hypermetabolic supraclavicular, mediastinal, abdominal and pelvic adenopathy. Additional smaller hypermetabolic lymph nodes identified within the left cervical lymph node chain. Largest lymph node was a 5.7 cm subcarinal lymph node (SUV 26.78).  Bone marrow aspirate and biopsyon 04/12/2018 revealed a slightly hypercellular bone marrow for age with trilineage hematopoiesis. There were dyspoietic changes associated with the granulocyte series. Flow cytometry revealed a predominance of T lymphocytes with relative abundance of CD8 + cells. There was no significant B cell population. Cytogenetics are pending.  Echorevealed an EF of 55-60% on 04/04/2018, 50-55% on 07/19/2018, and 45-50% on 04/03/2019.  She presented with a normocytic anemia. She was admitted x 2 (12/2017 and 01/2018) with rectal bleeding. She is intolerant of oral iron. Dietis modest.  EGDon 12/30/2017 revealed non bleeding gastric erosionsin the antrum. Colonoscopyon 12/30/2017 revealed numerous polyps(one 5 mm Judy ascending colon, two 7 mm polyps Judy transverse colon, five 5-7 mm polyps Judy the descending colon, one 7 mm polyp Judy the sigmoid colon, one 8 mm polyp Judy the recto-sigmoid colon, and one 19 mm polyp Judy the rectum). 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   She developed hematochezia post colonoscopy. Flexible sigmoidoscopyon 12/31/2017 revealed a rectal ulcer at the site of the polypectomy and blood Judy the rectum. Clips were placed. EGDon 02/05/2018 revealed 2 bleeding AVMs (treated with APC) Judy the small bowel with scalloped/denuded mucosa underneath the bleeding sites.  Work-up on 09/16/2019revealed a hematocrit of 32.5, hemoglobin 10.7, MCV 91.7, platelets 207,000, WBC 1800 with an ANC of 600. Uric acid was 8.8. LDH was 241 (98-192) and beta2-microglobulin 5.8 (0.6-2.4). Negative studiesincluded:  hepatitis B core antibody total, hepatitis B surface antigen, hepatitis C antibody, HIV testing.   She has required 4 units of PRBCs(3 units Judy 12/2017 and 1 unit Judy 01/2018).  Ferritinhas been followed: 30 on 12/29/2017, 93 on 02/13/2018, 57 on 03/07/2018, 38 on 04/03/2018, 93 on 03/22/2019, and 123 on 06/20/2019.  Labs on 12/29/2017 revealed the following: B12was 254 (low normal). She has been on oral B12since 03/07/2018. B12 was 1524 on 04/03/2018. Folate6.3 (>5.9) on 12/29/2017 and 55.8 on 04/03/2018.   Shereceived6cycles of mini-RCHOP(04/24/2018 - 09/10/2018) withgrowth factor support ([place] On-Pro Neulasta). Cycle #1 platelet nadir was 82,000. Cycle #4 was postponed on 07/13/2018 ([place] 0).   PET scanon 09/25/2018 revealed partial response to chemotherapy. The bulky intensely hypermetabolic mediastinal adenopathy was near completely resolved; however a solitary 1.3 cm node remained with intense radiotracer activity (Deauville 4). There was complete metabolic response and size reduction of cervical lymph nodes, retroperitoneal periaortic nodes and pelvic nodes (Deauville 1).  She receivedradiation therapywith 3000 cGy to the mediastinum and 3600 cGy to the hypermetabolic lymph node from 64/33/2951 - 12/04/2018.  ChestCTwithout contraston 01/09/2019 revealed severe bilateral paramediastinal radiation fibrosis changes.There was underlying emphysematous changes and pulmonary scarring. There were no findings suspicious for pulmonary metastatic disease. There was interval decrease Judy size of the precarinal lymph node when compared to prior study. There was stable advanced atherosclerotic calcifications involving the thoracic and upper abdominal aorta and branch vessels including dense three-vessel coronary artery calcifications.  PET scanon 03/20/2019 revealed further improvement Judy hypermetabolic activity associated with a small residual pre-carinal lymph node  which haddecreased Judy size compared with the prior PET-CT of 6 months ago (still Deauville 4). There was no other residual or recurrent hypermetabolic nodal activity Judy the neck, chest, abdomen or pelvis (Deauville 1).There  was resolving radiation changes Judy both lungs.There was stableextensive atherosclerosis and moderate centrilobular emphysema.  She has chronicrenal insufficiency. Creatinine was 1.42 (CrCl 25 ml/min) on 04/03/2018 and 0.98 (CrCl 40.2 ml/min) on 09/17/2019.  She haslupus(diagnosed 1982). She is on prednisone 5 mg a day and Plaquenil.  Symptomatically, she denies any fevers, sweats or weight loss.  Exam reveals no adenopathy.  LDH is 221.  Uric acid is 7.6.  Plan: 1.    Labs today: CBC with diff, CMP, LDH, uric acid. 2.Stage IVB diffuse large B cell lymphoma Clinically, she appears to be doing well. Shereceived 6 cycles of mini R-CHOP (last 08/31/2018). She had a partial response to treatment. She underwent involved field radiation to the mediastinum (completed05/18/2020). PETon09/01/2020revealed ongoing improvement Judy the mediastinal lymph node hypermetabolic activity. Unclear significance of elevated LDH and uric acid. Discuss restaging scans.  Patient Judy agreement. If active disease, plan Rituxan and Revlimid. 3. Lower extremity edema Patient has 1+ left ankle edema.  Bilateral lower extremity duplex on 03/22/2019 revealed no evidence of DVT.             Albumen is normal.  Etiology may be secondary to CHF. 4.Declining ejection fraction             Echo on 04/04/2018:  EF was 55-60%             Echo on 07/19/2018:  EF was 50-55%             Echo on 04/03/2019:  EF was 45-50%.             Patient denies symptoms of CHF.             Continue follow-up with cardiology. 5.   Chest, abdomen, pelvis CT Judy 2 weeks. 6.   RTC after CT scans for MD assessment and review of imaging studies.  I discussed the assessment and treatment plan  with the patient.  The patient was provided an opportunity to ask questions and all were answered.  The patient agreed with the plan and demonstrated an understanding of the instructions.  The patient was advised to call back if the symptoms worsen or if the condition fails to improve as anticipated.  I provided 25 minutes of face-to-face time during this this encounter and > 50% was spent counseling as documented under my assessment and plan.    Lequita Asal, MD, PhD    09/17/2019, 2:27 PM  I, Selena Batten, am acting as scribe for [employer]. Mike Gip, MD, PhD.  I, Melissa C. Mike Gip, MD, have reviewed the above documentation for accuracy and completeness, and I agree with the above.

## 2019-09-17 ENCOUNTER — Other Ambulatory Visit: Payer: Self-pay

## 2019-09-17 ENCOUNTER — Encounter: Payer: Self-pay | Admitting: Hematology and Oncology

## 2019-09-17 ENCOUNTER — Inpatient Hospital Stay: Payer: Medicare Other

## 2019-09-17 ENCOUNTER — Inpatient Hospital Stay: Payer: Medicare Other | Attending: Hematology and Oncology | Admitting: Hematology and Oncology

## 2019-09-17 VITALS — BP 132/65 | HR 86 | Temp 97.3°F | Resp 18 | Ht 59.0 in | Wt 123.7 lb

## 2019-09-17 DIAGNOSIS — E790 Hyperuricemia without signs of inflammatory arthritis and tophaceous disease: Secondary | ICD-10-CM | POA: Diagnosis not present

## 2019-09-17 DIAGNOSIS — F17210 Nicotine dependence, cigarettes, uncomplicated: Secondary | ICD-10-CM | POA: Diagnosis not present

## 2019-09-17 DIAGNOSIS — M3214 Glomerular disease in systemic lupus erythematosus: Secondary | ICD-10-CM | POA: Diagnosis not present

## 2019-09-17 DIAGNOSIS — C8339 Diffuse large B-cell lymphoma, extranodal and solid organ sites: Secondary | ICD-10-CM | POA: Insufficient documentation

## 2019-09-17 DIAGNOSIS — Z7952 Long term (current) use of systemic steroids: Secondary | ICD-10-CM | POA: Diagnosis not present

## 2019-09-17 DIAGNOSIS — N189 Chronic kidney disease, unspecified: Secondary | ICD-10-CM | POA: Insufficient documentation

## 2019-09-17 DIAGNOSIS — D649 Anemia, unspecified: Secondary | ICD-10-CM | POA: Insufficient documentation

## 2019-09-17 DIAGNOSIS — Z452 Encounter for adjustment and management of vascular access device: Secondary | ICD-10-CM | POA: Diagnosis not present

## 2019-09-17 DIAGNOSIS — Z79899 Other long term (current) drug therapy: Secondary | ICD-10-CM | POA: Diagnosis not present

## 2019-09-17 DIAGNOSIS — J432 Centrilobular emphysema: Secondary | ICD-10-CM | POA: Diagnosis not present

## 2019-09-17 DIAGNOSIS — C8330 Diffuse large B-cell lymphoma, unspecified site: Secondary | ICD-10-CM | POA: Diagnosis not present

## 2019-09-17 DIAGNOSIS — I129 Hypertensive chronic kidney disease with stage 1 through stage 4 chronic kidney disease, or unspecified chronic kidney disease: Secondary | ICD-10-CM | POA: Insufficient documentation

## 2019-09-17 DIAGNOSIS — Z7982 Long term (current) use of aspirin: Secondary | ICD-10-CM | POA: Diagnosis not present

## 2019-09-17 DIAGNOSIS — R600 Localized edema: Secondary | ICD-10-CM | POA: Insufficient documentation

## 2019-09-17 LAB — CBC WITH DIFFERENTIAL/PLATELET
Abs Immature Granulocytes: 0.04 10*3/uL (ref 0.00–0.07)
Basophils Absolute: 0 10*3/uL (ref 0.0–0.1)
Basophils Relative: 0 %
Eosinophils Absolute: 0.1 10*3/uL (ref 0.0–0.5)
Eosinophils Relative: 1 %
HCT: 37.4 % (ref 36.0–46.0)
Hemoglobin: 12 g/dL (ref 12.0–15.0)
Immature Granulocytes: 1 %
Lymphocytes Relative: 20 %
Lymphs Abs: 1.4 10*3/uL (ref 0.7–4.0)
MCH: 31.4 pg (ref 26.0–34.0)
MCHC: 32.1 g/dL (ref 30.0–36.0)
MCV: 97.9 fL (ref 80.0–100.0)
Monocytes Absolute: 0.5 10*3/uL (ref 0.1–1.0)
Monocytes Relative: 6 %
Neutro Abs: 5.3 10*3/uL (ref 1.7–7.7)
Neutrophils Relative %: 72 %
Platelets: 198 10*3/uL (ref 150–400)
RBC: 3.82 MIL/uL — ABNORMAL LOW (ref 3.87–5.11)
RDW: 13 % (ref 11.5–15.5)
WBC: 7.3 10*3/uL (ref 4.0–10.5)
nRBC: 0 % (ref 0.0–0.2)

## 2019-09-17 LAB — COMPREHENSIVE METABOLIC PANEL
ALT: 16 U/L (ref 0–44)
AST: 27 U/L (ref 15–41)
Albumin: 4.1 g/dL (ref 3.5–5.0)
Alkaline Phosphatase: 70 U/L (ref 38–126)
Anion gap: 9 (ref 5–15)
BUN: 22 mg/dL (ref 8–23)
CO2: 23 mmol/L (ref 22–32)
Calcium: 8.7 mg/dL — ABNORMAL LOW (ref 8.9–10.3)
Chloride: 106 mmol/L (ref 98–111)
Creatinine, Ser: 0.98 mg/dL (ref 0.44–1.00)
GFR calc Af Amer: >60 mL/min (ref >60)
GFR calc non Af Amer: 58 mL/min — ABNORMAL LOW (ref >60)
Glucose, Bld: 114 mg/dL — ABNORMAL HIGH (ref 70–99)
Potassium: 3.6 mmol/L (ref 3.5–5.1)
Sodium: 138 mmol/L (ref 135–145)
Total Bilirubin: 0.6 mg/dL (ref 0.3–1.2)
Total Protein: 6.6 g/dL (ref 6.5–8.1)

## 2019-09-17 LAB — LACTATE DEHYDROGENASE: LDH: 221 U/L — ABNORMAL HIGH (ref 98–192)

## 2019-09-17 LAB — URIC ACID: Uric Acid, Serum: 7.6 mg/dL — ABNORMAL HIGH (ref 2.5–7.1)

## 2019-09-17 MED ORDER — SODIUM CHLORIDE 0.9% FLUSH
10.0000 mL | INTRAVENOUS | Status: DC | PRN
  Administered 2019-09-17: 10 mL via INTRAVENOUS
  Filled 2019-09-17: qty 10

## 2019-09-17 MED ORDER — ALLOPURINOL 100 MG PO TABS: 50.0000 mg | ORAL_TABLET | Freq: Every day | ORAL | 1 refills | Status: DC

## 2019-09-17 MED ORDER — HEPARIN SOD (PORK) LOCK FLUSH 100 UNIT/ML IV SOLN
500.0000 [IU] | Freq: Once | INTRAVENOUS | Status: AC
  Administered 2019-09-17: 500 [IU] via INTRAVENOUS
  Filled 2019-09-17: qty 5

## 2019-10-01 ENCOUNTER — Other Ambulatory Visit: Payer: Self-pay

## 2019-10-01 ENCOUNTER — Ambulatory Visit
Admission: RE | Admit: 2019-10-01 | Discharge: 2019-10-01 | Disposition: A | Discharge status: Home / Self Care | Payer: Medicare Other | Source: Ambulatory Visit | Attending: Hematology and Oncology | Admitting: Hematology and Oncology

## 2019-10-01 DIAGNOSIS — C8330 Diffuse large B-cell lymphoma, unspecified site: Secondary | ICD-10-CM

## 2019-10-02 NOTE — Progress Notes (Signed)
[facility]  [address], [address] [place], [place] [address] Phone: [phone]  Fax: [phone]  Office Visit:  10/03/2019  Referring physician: [name], MD  Chief Complaint: Judy Torres is a 74 y.o. 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  HPI: The patient was last seen Judy the medical oncology clinic on 09/17/2019. At that time, she felt "ok".   She denied any fevers, sweats or weight loss.  Hematocrit was 37.4, hemoglobin 12.0, MCV 97.9, platelets 198,000, WBC 7300, ANC 5300.  Uric acid was 7.6 (2.5-7.1).  LDH was 221 (98-192).  Chest, abdomen, and pelvis CT on 10/01/2019 revealed no active lymphoma.  The index precarinal lymph node measured 0.6 cm (0.1 cm smaller than previously).   She has coronary atherosclerosis with prior CABG. there was prominent main pulmonary artery felt secondary to pulmonary artery hypertension.  There was esophageal dysmotility or reflux.  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  There was nonobstructive right nephrolithiasis.  There was a stable rim calcified lesion Judy the left ovary.   During the interim, she has felt "ok":   She denies any problems.  She notes some reflux.  She has not seen Dr. Bonna Gains since 03/30/2018.  At that time recommendation was for follow-up for polyp surveillance 2 years from 12/2017.  Colonoscopy by Dr. Marius Ditch on 12/30/2017 revealed multiple polyps.  Pathology revealed multiple tubular adenomas negative for high-grade dysplasia and malignancy.   Past Medical History:  Diagnosis Date  . Cancer [facility])    Stomach cancer  . Chronic kidney disease   . GERD (gastroesophageal reflux disease)   . HTN (hypertension)   . Hyperlipidemia   . Lupus ([place])   . S/P angioplasty with stent     Past Surgical History:  Procedure Laterality Date  . CARDIAC SURGERY    . CESAREAN SECTION    .  COLONOSCOPY N/A 12/30/2017   Procedure: COLONOSCOPY;  Surgeon: Lin Landsman, MD;  Location: [facility] ENDOSCOPY;  Service: Gastroenterology;  Laterality: N/A;  . CORONARY ARTERY BYPASS GRAFT  2003  . ESOPHAGOGASTRODUODENOSCOPY N/A 12/30/2017   Procedure: ESOPHAGOGASTRODUODENOSCOPY (EGD);  Surgeon: Lin Landsman, MD;  Location: [facility] ENDOSCOPY;  Service: Gastroenterology;  Laterality: N/A;  . ESOPHAGOGASTRODUODENOSCOPY Left 02/05/2018   Procedure: ESOPHAGOGASTRODUODENOSCOPY (EGD);  Surgeon: Virgel Manifold, MD;  Location: [facility] ENDOSCOPY;  Service: Endoscopy;  Laterality: Left;  . ESOPHAGOGASTRODUODENOSCOPY (EGD) WITH PROPOFOL N/A 03/15/2018   Procedure: ESOPHAGOGASTRODUODENOSCOPY (EGD) WITH PROPOFOL;  Surgeon: Virgel Manifold, MD;  Location: [facility] ENDOSCOPY;  Service: Endoscopy;  Laterality: N/A;  . FLEXIBLE SIGMOIDOSCOPY N/A 12/31/2017   Procedure: FLEXIBLE SIGMOIDOSCOPY;  Surgeon: Jonathon Bellows, MD;  Location: [facility] ENDOSCOPY;  Service: Gastroenterology;  Laterality: N/A;  . PORTA CATH INSERTION N/A 04/19/2018   Procedure: PORTA CATH INSERTION;  Surgeon: Algernon Huxley, MD;  Location: [facility] LAB;  Service: Cardiovascular;  Laterality: N/A;    Family History  Problem Relation Age of Onset  . Hypertension Mother   . Stroke Mother   . Vascular Disease Mother   . Hypertension Sister     Social History:  reports that she quit smoking about 17 years ago. Her smoking use included cigarettes. She has a 10.00 pack-year smoking history. She has never used smokeless tobacco. She reports previous alcohol use. She reports that she does not use drugs. She has smoked 1/2 pack/day x 20 years.  She stopped smoking Judy 2000. She denies any exposure to radiation or toxins. She is from [place]. She moved to the [place] 52 years ago after marrying her husband. She was Judy the [employer]. Her husband phone([phone]).The patient is alone today.    Allergies:  Allergies  Allergen  Reactions  . Penicillins Other (See Comments)    Pass out Has patient had a PCN reaction causing immediate rash, facial/tongue/throat swelling, SOB or lightheadedness with hypotension: Yes Has patient had a PCN reaction causing severe rash involving mucus membranes or skin necrosis: No Has patient had a PCN reaction that required hospitalization: No Has patient had a PCN reaction occurring within the last 10 years: No If all of the above answers are "NO", then may proceed with Cephalosporin use.   . Sulfa Antibiotics Nausea Only  . Sulfasalazine Nausea And Vomiting       . Ace Inhibitors Nausea Only  . Losartan Anxiety  . Spironolactone Other (See Comments)    Felt poorly but couldn't clarify     Current Medications: Current Outpatient Medications  Medication Sig Dispense Refill  . acetaminophen (TYLENOL) 500 MG tablet Take 1,000 mg by mouth 2 (two) times daily as needed for moderate pain or headache.    . alendronate (FOSAMAX) 70 MG tablet Take 70 mg by mouth every Saturday.     Marland Kitchen allopurinol (ZYLOPRIM) 100 MG tablet Take 0.5 tablets (50 mg total) by mouth daily. 45 tablet 1  . amLODipine (NORVASC) 10 MG tablet Take 10 mg by mouth daily.    Marland Kitchen aspirin 81 MG EC tablet Take by mouth.    . Calcium Carb-Cholecalciferol (CALCIUM 600-D PO) Take 600 mg by mouth daily.    . clotrimazole-betamethasone (LOTRISONE) cream Apply 1 application topically 2 (two) times daily as needed (rash).     Marland Kitchen dexamethasone (DECADRON) 4 MG tablet Take 1 tablet (4 mg total) by mouth daily. (Patient not taking: Reported on 03/22/2019) 30 tablet 0  . feeding supplement, ENSURE ENLIVE, (ENSURE ENLIVE) LIQD Take 237 mLs by mouth 2 (two) times daily between meals. (Patient not taking: Reported on 03/22/2019) 90 Bottle 0  . ferrous sulfate 325 (65 FE) MG EC tablet Take by mouth.    . ferrous sulfate 325 (65 FE) MG tablet Take 1 tablet (325 mg total) by mouth 2 (two) times daily with a meal. (Patient not taking: Reported  on 09/17/2019) 60 tablet 3  . fluticasone (FLONASE) 50 MCG/ACT nasal spray Place 1 spray into both nostrils daily as needed for allergies or rhinitis.    . furosemide (LASIX) 40 MG tablet Take 40 mg by mouth daily.    Marland Kitchen HYDROcodone-acetaminophen (NORCO/VICODIN) 5-325 MG tablet Take by mouth.    . hydroxychloroquine (PLAQUENIL) 200 MG tablet Take 200 mg by mouth 2 (two) times daily.     Marland Kitchen lidocaine-prilocaine (EMLA) cream Apply to affected area once (Patient not taking: Reported on 06/22/2019) 30 g 3  . Multiple Vitamin (MULTIVITAMIN WITH MINERALS) TABS tablet Take 1 tablet by mouth daily. 180 tablet 0  . ondansetron (ZOFRAN) 4 MG tablet Take 1 tablet (4 mg total) by mouth 2 (two) times daily as needed for refractory nausea / vomiting. Start on day 3 after cyclophosphamide chemotherapy. (Patient not taking: Reported on 06/22/2019) 30 tablet 3  . polyethylene glycol (MIRALAX / GLYCOLAX) packet Take 17 g by mouth daily.    . predniSONE (DELTASONE) 5 MG tablet Take 5 mg by mouth daily with breakfast.     . Pseudoephedrine-APAP  30-325 MG TABS Take 1 tablet by mouth daily as needed (sinuses).     . ranitidine (ZANTAC) 150 MG tablet Take 150 mg by mouth daily as needed for heartburn.     . simvastatin (ZOCOR) 40 MG tablet Take 40 mg by mouth daily.     . sucralfate (CARAFATE) 1 g tablet TAKE 1 TABLET BY MOUTH 3 (THREE) TIMES DAILY. DISSOLVE Judy 3-4 TBSP WARM WATER, SWISH AND SWALLOW (Patient not taking: Reported on 09/17/2019) 270 tablet 2  . traMADol (ULTRAM) 50 MG tablet Take 50 mg by mouth 2 (two) times daily as needed for moderate pain.     . vitamin B-12 (CYANOCOBALAMIN) 500 MCG tablet Take 500 mcg by mouth daily.    . Vitamins/Minerals TABS Take by mouth.     No current facility-administered medications for this visit.    Review of Systems  Constitutional: Negative.  Negative for chills, diaphoresis, fever, malaise/fatigue and weight loss (stable).       Feels "ok".  HENT: Negative.  Negative for  congestion, ear pain, nosebleeds, sinus pain and sore throat.   Eyes: Negative.  Negative for blurred vision, double vision and photophobia.  Respiratory: Positive for cough (seasonal). Negative for hemoptysis, sputum production and shortness of breath.   Cardiovascular: Positive for leg swelling (chronic BILATERAL ankles). Negative for chest pain, palpitations, orthopnea and PND.       Serial echoes reveal declining EF.  Gastrointestinal: Positive for heartburn. Negative for abdominal pain, blood Judy stool, constipation, diarrhea, melena, nausea and vomiting.       Eating well.  H/o multiple tubular adenomas on colonoscopy 12/2017.  Genitourinary: Negative.  Negative for dysuria, frequency, hematuria and urgency.  Musculoskeletal: Negative.  Negative for back pain, falls, joint pain, myalgias and neck pain.  Skin: Negative.  Negative for itching and rash.  Neurological: Negative.  Negative for dizziness, tingling, tremors, sensory change, speech change, focal weakness, weakness and headaches.  Endo/Heme/Allergies: Negative.  Does not bruise/bleed easily.  Psychiatric/Behavioral: Negative.  Negative for depression and memory loss. The patient is not nervous/anxious and does not have insomnia.   All other systems reviewed and are negative.  Performance status (ECOG): 0  Blood pressure (!) 152/78, pulse 88, temperature (!) 97.1 F (36.2 C), temperature source Tympanic, resp. rate 18, height 4' 11" (1.499 m), weight 123 lb 10.9 oz (56.1 kg), SpO2 100 %.   Physical Exam  Constitutional: She is oriented to person, place, and time. She appears well-developed and well-nourished. No distress.  HENT:  Head: Normocephalic and atraumatic.  Curly dark hair.  Eyes: Conjunctivae and EOM are normal.  Glasses.  Brown eyes.  Abdominal: Normal appearance.  Neurological: She is alert and oriented to person, place, and time.  Skin: She is not diaphoretic.  Psychiatric: She has a normal mood and affect. Her  behavior is normal. Judgment and thought content normal.  Vitals reviewed.   Imaging studies: 04/06/2018:PET scanrevealed enlarged and hypermetabolic supraclavicular, mediastinal, abdominal and pelvic adenopathy. Additional smaller hypermetabolic lymph nodes identified within the left cervical lymph node chain. Largest lymph node was a 5.7 cm subcarinal lymph node (SUV 26.78). 07/03/2018:Chest, abdomen, and pelvic CTrevealed marked interval response to therapy. Previously identified relatively bulky lymphadenopathy Judy the chest, abdomen, and pelvis had resolved with no pathologically enlarged lymphadenopathy. There was no new or progressive interval findings. 09/25/2018:PET scanrevealed partial response to chemotherapy. The bulky intensely hypermetabolic mediastinal adenopathy was near completely resolved; however a solitary 1.3 cm node remained with intense radiotracer activity (De[place] 4).  There was complete metabolic response and size reduction of cervical lymph nodes, retroperitoneal periaortic nodes and pelvic nodes (Deau[place] 1). 01/09/2019:ChestCTwithout contrastrevealed severe bilateral paramediastinal radiation fibrosis changes.There was underlying emphysematous changes and pulmonary scarring. There were no findings suspicious for pulmonary metastatic disease. There was interval decrease Judy size of the precarinal lymph node when compared to prior study. There was stable advanced atherosclerotic calcifications involving the thoracic and upper abdominal aorta and branch vessels including dense three-vessel coronary artery calcifications. 03/20/2019:  PET scan revealed further improvement Judy hypermetabolic activity associated with a small residual pre-carinal lymph node which had decreased Judy size compared with the prior PET-CT of 6 months ago (still [place] 4).  There was no other residual or recurrent hypermetabolic nodal activity Judy the neck, chest, abdomen or pelvis ([place]  1). There was resolving radiation changes Judy both lungs. There was stable extensive atherosclerosis and moderate centrilobular emphysema.  10/01/2019:  Chest, abdomen, and pelvis CT revealed no active lymphoma.  The index precarinal lymph node measured 0.6 cm (0.1 cm smaller than previously).   She has coronary atherosclerosis with prior CABG. there was prominent main pulmonary artery felt secondary to pulmonary artery hypertension.  There was esophageal dysmotility or reflux.  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  There was nonobstructive right nephrolithiasis.  There was a stable rim calcified lesion Judy the left ovary.   No visits with results within 3 Day(s) from this visit.  Latest known visit with results is:  Infusion on 09/17/2019  Component Date Value Ref Range Status  . LDH 09/17/2019 221* 98 - 192 U/L Final   Performed at [facility], [address], [place], [place] [address]  . Uric Acid, Serum 09/17/2019 7.6* 2.5 - 7.1 mg/dL Final   Performed at [facility], [address] [place], [place] [address]  . Sodium 09/17/2019 138  135 - 145 mmol/L Final  . Potassium 09/17/2019 3.6  3.5 - 5.1 mmol/L Final  . Chloride 09/17/2019 106  98 - 111 mmol/L Final  . CO2 09/17/2019 23  22 - 32 mmol/L Final  . Glucose, Bld 09/17/2019 114* 70 - 99 mg/dL Final   Glucose reference range applies only to samples taken after fasting for at least 8 hours.  . BUN 09/17/2019 22  8 - 23 mg/dL Final  . Creatinine, Ser 09/17/2019 0.98  0.44 - 1.00 mg/dL Final  . Calcium 09/17/2019 8.7* 8.9 - 10.3 mg/dL Final  . Total Protein 09/17/2019 6.6  6.5 - 8.1 g/dL Final  . Albumin 09/17/2019 4.1  3.5 - 5.0 g/dL Final  . AST 09/17/2019 27  15 - 41 U/L Final  . ALT 09/17/2019 16  0 - 44 U/L Final  . Alkaline Phosphatase 09/17/2019 70  38 - 126 U/L Final  . Total Bilirubin 09/17/2019 0.6  0.3 - 1.2 mg/dL Final  . GFR calc non Af Amer  09/17/2019 58* >60 mL/min Final  . GFR calc Af Amer 09/17/2019 >60  >60 mL/min Final  . Anion gap 09/17/2019 9  5 - 15 Final   Performed at [facility], [address] [place], [place] [address]  . WBC 09/17/2019 7.3  4.0 - 10.5 K/uL Final  . RBC 09/17/2019 3.82* 3.87 - 5.11 MIL/uL Final  . Hemoglobin 09/17/2019 12.0  12.0 - 15.0 g/dL Final  . HCT 09/17/2019 37.4  36.0 - 46.0 % Final  . MCV 09/17/2019 97.9  80.0 -  100.0 fL Final  . MCH 09/17/2019 31.4  26.0 - 34.0 pg Final  . MCHC 09/17/2019 32.1  30.0 - 36.0 g/dL Final  . RDW 09/17/2019 13.0  11.5 - 15.5 % Final  . Platelets 09/17/2019 198  150 - 400 K/uL Final  . nRBC 09/17/2019 0.0  0.0 - 0.2 % Final  . Neutrophils Relative % 09/17/2019 72  % Final  . Neutro Abs 09/17/2019 5.3  1.7 - 7.7 K/uL Final  . Lymphocytes Relative 09/17/2019 20  % Final  . Lymphs Abs 09/17/2019 1.4  0.7 - 4.0 K/uL Final  . Monocytes Relative 09/17/2019 6  % Final  . Monocytes Absolute 09/17/2019 0.5  0.1 - 1.0 K/uL Final  . Eosinophils Relative 09/17/2019 1  % Final  . Eosinophils Absolute 09/17/2019 0.1  0.0 - 0.5 K/uL Final  . Basophils Relative 09/17/2019 0  % Final  . Basophils Absolute 09/17/2019 0.0  0.0 - 0.1 K/uL Final  . Immature Granulocytes 09/17/2019 1  % Final  . Abs Immature Granulocytes 09/17/2019 0.04  0.00 - 0.07 K/uL Final   Performed at [facility], [address], [place], [place] [address]    Assessment:  Judy Courtenay Hirth is a 74 y.o. female with stage IVBdiffuse large B cell lymphomainvolving the gastric fundus and duodenum. IPI is 3 (intermediate-high risk), age-adjusted IPI is 2 (low-intermediate risk), revised IPI is 3 (poor), and NCCN-IPI is 5 (intermediate-high risk). CNS risk is 3 (intermediate: age, LDH, stage).  EGDon 03/15/2018 revealed pathology from the duodenal scalloped lesion and duodenal sweep ulcerwith involvement with diffuse large B cell lymphoma. Random gastric biopsy was negative  for lymphoma. There was mild chronic gastritis with reactive foveolar hyperplasia. Gastric fundus scarrevealed involvement with diffuse large B cell lymphoma. GE junction biopsy revealed no lymphoma with squamocolumnar mucosa with mild chronic active inflammation.  The lymphoma has a non-germinal center immunophenotype with a Ki-67 proliferation index of 60% and shows Bcl-2/CMYC co-expression by immunohistochemical analysis. Summary of IHC results, neoplastic cells: Positive: PAX-5, CD20(variable), Bcl-2, MUM1, Bcl-6 (variable), CMYC. Negative: CD10, CD5, CD30, CD138, Cyclin-D1, ALK-1, CD3. EBV (ISH): Negative. FISH for MYCgene rearrangement (8q24 gene region) was negative.  PET scanon 04/06/2018 revealed enlarged and hypermetabolic supraclavicular, mediastinal, abdominal and pelvic adenopathy. Additional smaller hypermetabolic lymph nodes identified within the left cervical lymph node chain. Largest lymph node was a 5.7 cm subcarinal lymph node (SUV 26.78).  Bone marrow aspirate and biopsyon 04/12/2018 revealed a slightly hypercellular bone marrow for age with trilineage hematopoiesis. There were dyspoietic changes associated with the granulocyte series. Flow cytometry revealed a predominance of T lymphocytes with relative abundance of CD8 + cells. There was no significant B cell population. Cytogenetics are pending.  Echorevealed an EF of 55-60% on 04/04/2018, 50-55% on 07/19/2018, and 45-50% on 04/03/2019.  She presented with a normocytic anemia. She was admitted x 2 (12/2017 and 01/2018) with rectal bleeding. She is intolerant of oral iron. Dietis modest.  EGDon 12/30/2017 revealed non bleeding gastric erosionsin the antrum. Colonoscopyon 12/30/2017 revealed numerous polyps(one 5 mm Judy ascending colon, two 7 mm polyps Judy transverse colon, five 5-7 mm polyps Judy the descending colon, one 7 mm polyp Judy the sigmoid colon, one 8 mm polyp Judy the recto-sigmoid colon, and one  19 mm polyp Judy the rectum). 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   She developed hematochezia post colonoscopy. Flexible sigmoidoscopyon 12/31/2017 revealed a rectal ulcer at the site of the polypectomy and blood Judy the rectum.  Clips were placed. EGDon 02/05/2018 revealed 2 bleeding AVMs (treated with APC) Judy the small bowel with scalloped/denuded mucosa underneath the bleeding sites.  Work-up on 09/16/2019revealed a hematocrit of 32.5, hemoglobin 10.7, MCV 91.7, platelets 207,000, WBC 1800 with an ANC of 600. Uric acid was 8.8. LDH was 241 (98-192) and beta2-microglobulin 5.8 (0.6-2.4). Negative studiesincluded: hepatitis B core antibody total, hepatitis B surface antigen, hepatitis C antibody, HIV testing.   She has required 4 units of PRBCs(3 units Judy 12/2017 and 1 unit Judy 01/2018).  Ferritinhas been followed: 30 on 12/29/2017, 93 on 02/13/2018, 57 on 03/07/2018, 38 on 04/03/2018, 93 on 03/22/2019, and 123 on 06/20/2019.  Labs on 12/29/2017 revealed the following: B12was 254 (low normal). She has been on oral B12since 03/07/2018. B12 was 1524 on 04/03/2018. Folate6.3 (>5.9) on 12/29/2017 and 55.8 on 04/03/2018.   Shereceived6cycles of mini-RCHOP(04/24/2018 - 09/10/2018) withgrowth factor support ([place] On-Pro Neulasta). Cycle #1 platelet nadir was 82,000. Cycle #4 was postponed on 07/13/2018 ([place] 0).   PET scanon 09/25/2018 revealed partial response to chemotherapy. The bulky intensely hypermetabolic mediastinal adenopathy was near completely resolved; however a solitary 1.3 cm node remained with intense radiotracer activity (De[place] 4). There was complete metabolic response and size reduction of cervical lymph nodes, retroperitoneal periaortic nodes and pelvic nodes ([place] 1).  She receivedradiation therapywith 3000 cGy to the mediastinum and 3600 cGy to the hypermetabolic lymph node from 80/32/1224 -  12/04/2018.  ChestCTwithout contraston 01/09/2019 revealed severe bilateral paramediastinal radiation fibrosis changes.There was underlying emphysematous changes and pulmonary scarring. There were no findings suspicious for pulmonary metastatic disease. There was interval decrease Judy size of the precarinal lymph node when compared to prior study. There was stable advanced atherosclerotic calcifications involving the thoracic and upper abdominal aorta and branch vessels including dense three-vessel coronary artery calcifications.  PET scan on 03/20/2019 revealed further improvement Judy hypermetabolic activity associated with a small residual pre-carinal lymph node which had decreased Judy size compared with the prior PET-CT of 6 months ago (still [place] 4).  There was no other residual or recurrent hypermetabolic nodal activity Judy the neck, chest, abdomen or pelvis (Deau[place] 1). There was resolving radiation changes Judy both lungs. There was stable extensive atherosclerosis and moderate centrilobular emphysema.   Chest, abdomen, and pelvis CT on 10/01/2019 revealed no active lymphoma.  The index precarinal lymph node measured 0.6 cm (0.1 cm smaller than previously).   She has chronicrenal insufficiency. Creatinine was 1.42 (CrCl 25 ml/min) on 04/03/2018.  She haslupus(diagnosed 1982). She is on prednisone 5 mg a day and Plaquenil.  Symptomatically, she is doing well.  She notes some reflux.  Plan: 1.   Review labs from 09/17/2019. 2.Stage IVB diffuse large B cell lymphoma Clinically, she is doing well. Shereceived 6 cycles of mini R-CHOP (last 08/31/2018). She had a partial response to treatment. She underwent involved field radiation to the mediastinum (completed05/18/2020). PET on09/07/2018 revealed improvement Judy the mediastinal lymph node hypermetabolic activity.            She had no evidence of recurrent disease Judy the neck chest abdomen or pelvis. Chest, abdomen and  pelvic CT scan on 10/01/2019 was personally reviewed.  Agree with radiology interpretation.  There is no evidence of recurrent lymphoma.  Additional details of scan were discussed with patient.  Copy of report was provided to patient. Discuss plan for ongoing surveillance. If active disease, plan Rituxan and Revlimid. 3.   Declining ejection fraction  Echo on 04/04/2018:  EF was 55-60%  Echo  on 07/19/2018:  EF was 50-55%  Echo on 04/03/2019:  EF was 45-50%.  She denies symptoms of CHF.  Continue follow-up with cardiology. 4.   Elevated uric acid  Uric acid 7.6 on 09/17/2019.  Rx: allopurinol.  Follow-up uric acid level at next port flush. 5.   GI issues  Patient notes symptoms of reflux.  Review last note by Dr. Bonna Gains on 03/30/2018.  Patient has a history of multiple colonic polyps noted on: Colonoscopy Judy 12/2017.  Pathology confirmed multiple adenomas.  Patient to have a follow-up colonoscopy Judy 12/2019.  Patient unaware.  Send note to Dr. Chuck Hint. 6.   RTC Judy 2 months for labs (uric acid) and port flush. 7.   RTC Judy 4 months for MD assessment, labs (CBC with diff, CMP, LDH, uric acid), and port flush.  I discussed the assessment and treatment plan with the patient.  The patient was provided an opportunity to ask questions and all were answered.  The patient agreed with the plan and demonstrated an understanding of the instructions.  The patient was advised to call back if the symptoms worsen or if the condition fails to improve as anticipated.   Lequita Asal, MD, PhD    10/03/2019, 8:50 PM

## 2019-10-03 ENCOUNTER — Encounter: Payer: Self-pay | Admitting: Hematology and Oncology

## 2019-10-03 ENCOUNTER — Other Ambulatory Visit: Payer: Self-pay

## 2019-10-03 ENCOUNTER — Inpatient Hospital Stay (HOSPITAL_BASED_OUTPATIENT_CLINIC_OR_DEPARTMENT_OTHER): Payer: Medicare Other | Admitting: Hematology and Oncology

## 2019-10-03 VITALS — BP 152/78 | HR 88 | Temp 97.1°F | Resp 18 | Ht 59.0 in | Wt 123.7 lb

## 2019-10-03 DIAGNOSIS — E790 Hyperuricemia without signs of inflammatory arthritis and tophaceous disease: Secondary | ICD-10-CM | POA: Diagnosis not present

## 2019-10-03 DIAGNOSIS — D126 Benign neoplasm of colon, unspecified: Secondary | ICD-10-CM

## 2019-10-03 DIAGNOSIS — C8330 Diffuse large B-cell lymphoma, unspecified site: Secondary | ICD-10-CM

## 2019-10-03 DIAGNOSIS — C8339 Diffuse large B-cell lymphoma, extranodal and solid organ sites: Secondary | ICD-10-CM | POA: Diagnosis not present

## 2019-10-03 NOTE — Progress Notes (Signed)
No new changes noted today 

## 2019-10-07 DIAGNOSIS — E790 Hyperuricemia without signs of inflammatory arthritis and tophaceous disease: Secondary | ICD-10-CM | POA: Insufficient documentation

## 2019-10-07 DIAGNOSIS — D126 Benign neoplasm of colon, unspecified: Secondary | ICD-10-CM | POA: Insufficient documentation

## 2019-10-23 DIAGNOSIS — H2511 Age-related nuclear cataract, right eye: Secondary | ICD-10-CM | POA: Diagnosis not present

## 2019-11-06 ENCOUNTER — Telehealth: Payer: Medicare Other | Admitting: Gastroenterology

## 2019-11-08 ENCOUNTER — Other Ambulatory Visit: Payer: Self-pay

## 2019-11-08 ENCOUNTER — Encounter: Payer: Self-pay | Admitting: Ophthalmology

## 2019-11-13 ENCOUNTER — Other Ambulatory Visit
Admission: RE | Admit: 2019-11-13 | Discharge: 2019-11-13 | Disposition: A | Discharge status: Home / Self Care | Payer: Medicare HMO | Source: Ambulatory Visit | Attending: Ophthalmology | Admitting: Ophthalmology

## 2019-11-13 ENCOUNTER — Other Ambulatory Visit: Payer: Self-pay

## 2019-11-13 DIAGNOSIS — Z01812 Encounter for preprocedural laboratory examination: Secondary | ICD-10-CM | POA: Diagnosis not present

## 2019-11-13 DIAGNOSIS — Z20822 Contact with and (suspected) exposure to covid-19: Secondary | ICD-10-CM | POA: Insufficient documentation

## 2019-11-13 LAB — SARS CORONAVIRUS 2 (TAT 6-24 HRS): SARS Coronavirus 2: NEGATIVE

## 2019-11-13 NOTE — Discharge Instructions (Signed)

## 2019-11-14 ENCOUNTER — Other Ambulatory Visit: Payer: Self-pay

## 2019-11-15 ENCOUNTER — Ambulatory Visit
Admission: RE | Admit: 2019-11-15 | Discharge: 2019-11-15 | Disposition: A | Discharge status: Home / Self Care | Payer: Medicare HMO | Source: Ambulatory Visit | Attending: Radiation Oncology | Admitting: Radiation Oncology

## 2019-11-15 ENCOUNTER — Other Ambulatory Visit: Payer: Self-pay

## 2019-11-15 ENCOUNTER — Encounter: Payer: Self-pay | Admitting: Ophthalmology

## 2019-11-15 ENCOUNTER — Ambulatory Visit: Payer: Medicare HMO | Admitting: Anesthesiology

## 2019-11-15 ENCOUNTER — Ambulatory Visit
Admission: RE | Admit: 2019-11-15 | Discharge: 2019-11-15 | Disposition: A | Discharge status: Home / Self Care | Payer: Medicare HMO | Attending: Ophthalmology | Admitting: Ophthalmology

## 2019-11-15 ENCOUNTER — Encounter
Admission: RE | Disposition: A | Discharge status: Home / Self Care | Payer: Self-pay | Source: Home / Self Care | Attending: Ophthalmology

## 2019-11-15 DIAGNOSIS — Z79899 Other long term (current) drug therapy: Secondary | ICD-10-CM | POA: Insufficient documentation

## 2019-11-15 DIAGNOSIS — Z951 Presence of aortocoronary bypass graft: Secondary | ICD-10-CM | POA: Insufficient documentation

## 2019-11-15 DIAGNOSIS — M329 Systemic lupus erythematosus, unspecified: Secondary | ICD-10-CM | POA: Insufficient documentation

## 2019-11-15 DIAGNOSIS — I10 Essential (primary) hypertension: Secondary | ICD-10-CM | POA: Insufficient documentation

## 2019-11-15 DIAGNOSIS — Z7952 Long term (current) use of systemic steroids: Secondary | ICD-10-CM | POA: Diagnosis not present

## 2019-11-15 DIAGNOSIS — J449 Chronic obstructive pulmonary disease, unspecified: Secondary | ICD-10-CM | POA: Insufficient documentation

## 2019-11-15 DIAGNOSIS — Z9221 Personal history of antineoplastic chemotherapy: Secondary | ICD-10-CM | POA: Insufficient documentation

## 2019-11-15 DIAGNOSIS — E7800 Pure hypercholesterolemia, unspecified: Secondary | ICD-10-CM | POA: Diagnosis not present

## 2019-11-15 DIAGNOSIS — Z87891 Personal history of nicotine dependence: Secondary | ICD-10-CM | POA: Insufficient documentation

## 2019-11-15 DIAGNOSIS — H25811 Combined forms of age-related cataract, right eye: Secondary | ICD-10-CM | POA: Diagnosis not present

## 2019-11-15 DIAGNOSIS — Z85028 Personal history of other malignant neoplasm of stomach: Secondary | ICD-10-CM | POA: Insufficient documentation

## 2019-11-15 DIAGNOSIS — Z923 Personal history of irradiation: Secondary | ICD-10-CM | POA: Diagnosis not present

## 2019-11-15 DIAGNOSIS — I2510 Atherosclerotic heart disease of native coronary artery without angina pectoris: Secondary | ICD-10-CM | POA: Diagnosis not present

## 2019-11-15 DIAGNOSIS — H2511 Age-related nuclear cataract, right eye: Secondary | ICD-10-CM | POA: Diagnosis not present

## 2019-11-15 DIAGNOSIS — H9190 Unspecified hearing loss, unspecified ear: Secondary | ICD-10-CM | POA: Insufficient documentation

## 2019-11-15 HISTORY — PX: CATARACT EXTRACTION W/PHACO: SHX586

## 2019-11-15 HISTORY — DX: Presence of dental prosthetic device (complete) (partial): Z97.2

## 2019-11-15 SURGERY — PHACOEMULSIFICATION, CATARACT, WITH IOL INSERTION
Anesthesia: Monitor Anesthesia Care | Site: Eye | Laterality: Right

## 2019-11-15 MED ORDER — TETRACAINE HCL 0.5 % OP SOLN
1.0000 [drp] | OPHTHALMIC | Status: DC | PRN
  Administered 2019-11-15 (×3): 1 [drp] via OPHTHALMIC

## 2019-11-15 MED ORDER — FENTANYL CITRATE (PF) 100 MCG/2ML IJ SOLN
INTRAMUSCULAR | Status: DC | PRN
  Administered 2019-11-15: 50 ug via INTRAVENOUS

## 2019-11-15 MED ORDER — TRYPAN BLUE 0.06 % OP SOLN
OPHTHALMIC | Status: DC | PRN
  Administered 2019-11-15: 0.5 mL via INTRAOCULAR

## 2019-11-15 MED ORDER — EPINEPHRINE PF 1 MG/ML IJ SOLN
INTRAOCULAR | Status: DC | PRN
  Administered 2019-11-15: 71 mL via OPHTHALMIC

## 2019-11-15 MED ORDER — LACTATED RINGERS IV SOLN: 100.0000 mL/h | INTRAVENOUS | Status: DC

## 2019-11-15 MED ORDER — NA CHONDROIT SULF-NA HYALURON 40-17 MG/ML IO SOLN
INTRAOCULAR | Status: DC | PRN
  Administered 2019-11-15: 1 mL via INTRAOCULAR

## 2019-11-15 MED ORDER — PROVISC 10 MG/ML IO SOLN
INTRAOCULAR | Status: DC | PRN
  Administered 2019-11-15: 0.55 mL via INTRAOCULAR

## 2019-11-15 MED ORDER — MIDAZOLAM HCL 2 MG/2ML IJ SOLN
INTRAMUSCULAR | Status: DC | PRN
  Administered 2019-11-15: 1 mg via INTRAVENOUS

## 2019-11-15 MED ORDER — ARMC OPHTHALMIC DILATING DROPS
1.0000 "application " | OPHTHALMIC | Status: DC | PRN
  Administered 2019-11-15 (×3): 1 via OPHTHALMIC

## 2019-11-15 MED ORDER — MOXIFLOXACIN HCL 0.5 % OP SOLN
OPHTHALMIC | Status: DC | PRN
  Administered 2019-11-15: 0.2 mL via OPHTHALMIC

## 2019-11-15 MED ORDER — LIDOCAINE HCL (PF) 2 % IJ SOLN
INTRAOCULAR | Status: DC | PRN
  Administered 2019-11-15: 2 mL via INTRAOCULAR

## 2019-11-15 MED ORDER — BRIMONIDINE TARTRATE-TIMOLOL 0.2-0.5 % OP SOLN
OPHTHALMIC | Status: DC | PRN
  Administered 2019-11-15: 1 [drp] via OPHTHALMIC

## 2019-11-15 MED ORDER — TETRACAINE 0.5 % OP SOLN OPTIME - NO CHARGE
OPHTHALMIC | Status: DC | PRN
  Administered 2019-11-15: 2 [drp] via OPHTHALMIC

## 2019-11-15 SURGICAL SUPPLY — 17 items
DISSECTOR HYDRO NUCLEUS 50X22 (MISCELLANEOUS) ×12 IMPLANT
DRSG TEGADERM 2-3/8X2-3/4 SM (GAUZE/BANDAGES/DRESSINGS) ×3 IMPLANT
GLOVE BIOGEL PI IND STRL 8 (GLOVE) ×1 IMPLANT
GLOVE BIOGEL PI INDICATOR 8 (GLOVE) ×2
GOWN STRL REUS W/ TWL LRG LVL3 (GOWN DISPOSABLE) ×1 IMPLANT
GOWN STRL REUS W/ TWL XL LVL3 (GOWN DISPOSABLE) ×1 IMPLANT
GOWN STRL REUS W/TWL LRG LVL3 (GOWN DISPOSABLE) ×3
GOWN STRL REUS W/TWL XL LVL3 (GOWN DISPOSABLE) ×3
KNIFE 45D UP 2.3 (MISCELLANEOUS) ×3 IMPLANT
LENS IOL DIOP 19.5 (Intraocular Lens) ×3 IMPLANT
LENS IOL TECNIS MONO 19.5 (Intraocular Lens) IMPLANT
PACK CATARACT (MISCELLANEOUS) ×3 IMPLANT
PACK DR. KING ARMS (PACKS) ×3 IMPLANT
PACK EYE AFTER SURG (MISCELLANEOUS) ×3 IMPLANT
SOLUTION OPHTHALMIC SALT (MISCELLANEOUS) ×3 IMPLANT
WATER STERILE IRR 250ML POUR (IV SOLUTION) ×3 IMPLANT
WIPE NON LINTING 3.25X3.25 (MISCELLANEOUS) ×3 IMPLANT

## 2019-11-15 NOTE — Anesthesia Preprocedure Evaluation (Signed)
Anesthesia Evaluation  Patient identified by MRN, date of birth, ID band Patient awake    Reviewed: Allergy & Precautions, H&P , NPO status , Patient's Chart, lab work & pertinent test results  Airway Mallampati: II  TM Distance: >3 FB Neck ROM: full    Dental  (+) Upper Dentures, Lower Dentures   Pulmonary COPD, former smoker,    Pulmonary exam normal breath sounds clear to auscultation       Cardiovascular hypertension, + CAD  Normal cardiovascular exam Rhythm:regular Rate:Normal     Neuro/Psych    GI/Hepatic GERD  ,  Endo/Other    Renal/GU      Musculoskeletal   Abdominal   Peds  Hematology   Anesthesia Other Findings   Reproductive/Obstetrics                             Anesthesia Physical Anesthesia Plan  ASA: III  Anesthesia Plan: MAC   Post-op Pain Management:    Induction:   PONV Risk Score and Plan: 2 and Treatment may vary due to age or medical condition, Midazolam and TIVA  Airway Management Planned:   Additional Equipment:   Intra-op Plan:   Post-operative Plan:   Informed Consent: I have reviewed the patients History and Physical, chart, labs and discussed the procedure including the risks, benefits and alternatives for the proposed anesthesia with the patient or authorized representative who has indicated his/her understanding and acceptance.     Dental Advisory Given  Plan Discussed with: CRNA  Anesthesia Plan Comments:         Anesthesia Quick Evaluation

## 2019-11-15 NOTE — Op Note (Signed)
  PREOPERATIVE DIAGNOSIS:  Nuclear sclerotic cataract of the RIGHT eye.   POSTOPERATIVE DIAGNOSIS:  Nuclear sclerotic cataract of the RIGHT eye.   OPERATIVE PROCEDURE: Cataract surgery OD   SURGEON:  Marchia Meiers, MD.   ANESTHESIA:  Anesthesiologist: Ronelle Nigh, MD CRNA: Izetta Dakin, CRNA  1.      Managed anesthesia care. 2.     0.23ml of Shugarcaine was instilled following the paracentesis   COMPLICATIONS:  None.   TECHNIQUE:   Divide and conquer   DESCRIPTION OF PROCEDURE:  The patient was examined and consented in the preoperative holding area where the aforementioned topical anesthesia was applied to the RIGHT eye and then brought back to the Operating Room where the RIGHT eye was prepped and draped in the usual sterile ophthalmic fashion and a lid speculum was placed. A paracentesis was created with the side port blade, the anterior chamber was washed out with trypan blue to stain the anterior capsule, and the anterior chamber was filled with viscoelastic. A near clear corneal incision was performed with the steel keratome. A continuous curvilinear capsulorrhexis was performed with a cystotome followed by the capsulorrhexis forceps. Hydrodissection and hydrodelineation were carried out with BSS on a blunt cannula. The lens was removed in a divide and conquer  technique and the remaining cortical material was removed with the irrigation-aspiration handpiece. The capsular bag was inflated with viscoelastic and the lens was placed in the capsular bag without complication. The remaining viscoelastic was removed from the eye with the irrigation-aspiration handpiece. The wounds were hydrated. The anterior chamber was flushed and the eye was inflated to physiologic pressure. 0.23ml Vigamox was placed in the anterior chamber. The wounds were found to be water tight. The eye was dressed with Vigamox. The patient was given protective glasses to wear throughout the day and a shield with which  to sleep tonight. The patient was also given drops with which to begin a drop regimen today and will follow-up with me in one day. Implant Name Type Inv. Item Serial No. Manufacturer Lot No. LRB No. Used Action  LENS IOL DIOP 19.5 - [id number] Intraocular Lens LENS IOL DIOP 19.5 [id number] AMO  Right 1 Implanted    Procedure(s) with comments: CATARACT EXTRACTION PHACO AND INTRAOCULAR LENS PLACEMENT (IOC) RIGHT  VISION BLUE 7.48 00:45.8 (Right) - Port a cath  Electronically signed: Marchia Meiers 11/15/2019 12:23 PM

## 2019-11-15 NOTE — H&P (Signed)
   I have reviewed the patient's H&P and agree with its findings. There have been no interval changes.  Brittnay Pigman MD Ophthalmology 

## 2019-11-15 NOTE — Transfer of Care (Addendum)
Immediate Anesthesia Transfer of Care Note  Patient: Judy Torres  Procedure(s) Performed: CATARACT EXTRACTION PHACO AND INTRAOCULAR LENS PLACEMENT (IOC) RIGHT  VISION BLUE 7.48 00:45.8 (Right Eye)  Patient Location: PACU  Anesthesia Type: MAC  Level of Consciousness: awake, alert  and patient cooperative  Airway and Oxygen Therapy: Patient Spontanous Breathing and Patient connected to supplemental oxygen  Post-op Assessment: Post-op Vital signs reviewed, Patient's Cardiovascular Status Stable, Respiratory Function Stable, Patent Airway and No signs of Nausea or vomiting  Post-op Vital Signs: Reviewed and stable  Complications: No apparent anesthesia complications

## 2019-11-15 NOTE — Anesthesia Procedure Notes (Signed)
Procedure Name: MAC Performed by: Alexcis Bicking M, CRNA Pre-anesthesia Checklist: Timeout performed, Patient being monitored, Suction available, Emergency Drugs available and Patient identified Patient Re-evaluated:Patient Re-evaluated prior to induction Oxygen Delivery Method: Nasal cannula       

## 2019-11-15 NOTE — Anesthesia Postprocedure Evaluation (Signed)
Anesthesia Post Note  Patient: [name]  Procedure(s) Performed: CATARACT EXTRACTION PHACO AND INTRAOCULAR LENS PLACEMENT (IOC) RIGHT  VISION BLUE 7.48 00:45.8 (Right Eye)     Patient location during evaluation: PACU Anesthesia Type: MAC Level of consciousness: awake and alert and oriented Pain management: satisfactory to patient Vital Signs Assessment: post-procedure vital signs reviewed and stable Respiratory status: spontaneous breathing, nonlabored ventilation and respiratory function stable Cardiovascular status: blood pressure returned to baseline and stable Postop Assessment: Adequate PO intake and No signs of nausea or vomiting Anesthetic complications: no    Raliegh Ip

## 2019-11-15 NOTE — Anesthesia Procedure Notes (Signed)
Procedure Name: MAC Performed by: Hal Norrington M, CRNA Pre-anesthesia Checklist: Timeout performed, Patient being monitored, Suction available, Patient identified and Emergency Drugs available Patient Re-evaluated:Patient Re-evaluated prior to induction Oxygen Delivery Method: Nasal cannula       

## 2019-11-16 ENCOUNTER — Encounter: Payer: Self-pay | Admitting: *Deleted

## 2019-12-03 ENCOUNTER — Inpatient Hospital Stay: Payer: Medicare HMO | Attending: Hematology and Oncology

## 2019-12-03 ENCOUNTER — Other Ambulatory Visit: Payer: Self-pay

## 2019-12-03 DIAGNOSIS — Z452 Encounter for adjustment and management of vascular access device: Secondary | ICD-10-CM | POA: Insufficient documentation

## 2019-12-03 DIAGNOSIS — C8339 Diffuse large B-cell lymphoma, extranodal and solid organ sites: Secondary | ICD-10-CM | POA: Diagnosis not present

## 2019-12-03 DIAGNOSIS — C8330 Diffuse large B-cell lymphoma, unspecified site: Secondary | ICD-10-CM

## 2019-12-03 LAB — URIC ACID: Uric Acid, Serum: 6.6 mg/dL (ref 2.5–7.1)

## 2019-12-03 MED ORDER — HEPARIN SOD (PORK) LOCK FLUSH 100 UNIT/ML IV SOLN
500.0000 [IU] | Freq: Once | INTRAVENOUS | Status: AC
  Administered 2019-12-03: 500 [IU] via INTRAVENOUS
  Filled 2019-12-03: qty 5

## 2019-12-03 MED ORDER — SODIUM CHLORIDE 0.9% FLUSH
10.0000 mL | INTRAVENOUS | Status: DC | PRN
  Administered 2019-12-03: 10 mL via INTRAVENOUS
  Filled 2019-12-03: qty 10

## 2019-12-05 ENCOUNTER — Other Ambulatory Visit: Payer: Self-pay

## 2019-12-05 ENCOUNTER — Encounter: Payer: Self-pay | Admitting: Gastroenterology

## 2019-12-05 ENCOUNTER — Ambulatory Visit: Payer: Medicare HMO | Admitting: Gastroenterology

## 2019-12-05 VITALS — BP 150/87 | HR 105 | Temp 97.6°F | Wt 121.8 lb

## 2019-12-05 DIAGNOSIS — Z86010 Personal history of colonic polyps: Secondary | ICD-10-CM

## 2019-12-05 DIAGNOSIS — R12 Heartburn: Secondary | ICD-10-CM

## 2019-12-05 NOTE — Patient Instructions (Addendum)
Please take your Esomeprazole 20 MG once a day for the next 2 months.   Gastroesophageal Reflux Disease, Adult Gastroesophageal reflux (GER) happens when acid from the stomach flows up into the tube that connects the mouth and the stomach (esophagus). Normally, food travels down the esophagus and stays in the stomach to be digested. With GER, food and stomach acid sometimes move back up into the esophagus. You may have a disease called gastroesophageal reflux disease (GERD) if the reflux:  Happens often.  Causes frequent or very bad symptoms.  Causes problems such as damage to the esophagus. When this happens, the esophagus becomes sore and swollen (inflamed). Over time, GERD can make small holes (ulcers) in the lining of the esophagus. What are the causes? This condition is caused by a problem with the muscle between the esophagus and the stomach. When this muscle is weak or not normal, it does not close properly to keep food and acid from coming back up from the stomach. The muscle can be weak because of:  Tobacco use.  Pregnancy.  Having a certain type of hernia (hiatal hernia).  Alcohol use.  Certain foods and drinks, such as coffee, chocolate, onions, and peppermint. What increases the risk? You are more likely to develop this condition if you:  Are overweight.  Have a disease that affects your connective tissue.  Use NSAID medicines. What are the signs or symptoms? Symptoms of this condition include:  Heartburn.  Difficult or painful swallowing.  The feeling of having a lump in the throat.  A bitter taste in the mouth.  Bad breath.  Having a lot of saliva.  Having an upset or bloated stomach.  Belching.  Chest pain. Different conditions can cause chest pain. Make sure you see your doctor if you have chest pain.  Shortness of breath or noisy breathing (wheezing).  Ongoing (chronic) cough or a cough at night.  Wearing away of the surface of teeth (tooth  enamel).  Weight loss. How is this treated? Treatment will depend on how bad your symptoms are. Your doctor may suggest:  Changes to your diet.  Medicine.  Surgery. Follow these instructions at home: Eating and drinking   Follow a diet as told by your doctor. You may need to avoid foods and drinks such as: ? Coffee and tea (with or without caffeine). ? Drinks that contain alcohol. ? Energy drinks and sports drinks. ? Bubbly (carbonated) drinks or sodas. ? Chocolate and cocoa. ? Peppermint and mint flavorings. ? Garlic and onions. ? Horseradish. ? Spicy and acidic foods. These include peppers, chili powder, curry powder, vinegar, hot sauces, and BBQ sauce. ? Citrus fruit juices and citrus fruits, such as oranges, lemons, and limes. ? Tomato-based foods. These include red sauce, chili, salsa, and pizza with red sauce. ? Fried and fatty foods. These include donuts, french fries, potato chips, and high-fat dressings. ? High-fat meats. These include hot dogs, rib eye steak, sausage, ham, and bacon. ? High-fat dairy items, such as whole milk, butter, and cream cheese.  Eat small meals often. Avoid eating large meals.  Avoid drinking large amounts of liquid with your meals.  Avoid eating meals during the 2-3 hours before bedtime.  Avoid lying down right after you eat.  Do not exercise right after you eat. Lifestyle   Do not use any products that contain nicotine or tobacco. These include cigarettes, e-cigarettes, and chewing tobacco. If you need help quitting, ask your doctor.  Try to lower your stress. If  you need help doing this, ask your doctor.  If you are overweight, lose an amount of weight that is healthy for you. Ask your doctor about a safe weight loss goal. General instructions  Pay attention to any changes in your symptoms.  Take over-the-counter and prescription medicines only as told by your doctor. Do not take aspirin, ibuprofen, or other NSAIDs unless your  doctor says it is okay.  Wear loose clothes. Do not wear anything tight around your waist.  Raise (elevate) the head of your bed about 6 inches (15 cm).  Avoid bending over if this makes your symptoms worse.  Keep all follow-up visits as told by your doctor. This is important. Contact a doctor if:  You have new symptoms.  You lose weight and you do not know why.  You have trouble swallowing or it hurts to swallow.  You have wheezing or a cough that keeps happening.  Your symptoms do not get better with treatment.  You have a hoarse voice. Get help right away if:  You have pain in your arms, neck, jaw, teeth, or back.  You feel sweaty, dizzy, or light-headed.  You have chest pain or shortness of breath.  You throw up (vomit) and your throw-up looks like blood or coffee grounds.  You pass out (faint).  Your poop (stool) is bloody or black.  You cannot swallow, drink, or eat. Summary  If a person has gastroesophageal reflux disease (GERD), food and stomach acid move back up into the esophagus and cause symptoms or problems such as damage to the esophagus.  Treatment will depend on how bad your symptoms are.  Follow a diet as told by your doctor.  Take all medicines only as told by your doctor. This information is not intended to replace advice given to you by your health care provider. Make sure you discuss any questions you have with your health care provider. Document Revised: 01/11/2018 Document Reviewed: 01/11/2018 Elsevier Patient Education  [employer].

## 2019-12-05 NOTE — Addendum Note (Signed)
Addended by: Wayna Chalet on: 12/05/2019 04:22 PM   Modules accepted: Orders

## 2019-12-05 NOTE — Progress Notes (Signed)
Judy Antigua, MD [address]  [place]  Judy [place] [address]  Main: [phone]  Fax: [phone]   Primary Care Physician: Judy Hire, MD   Chief Complaint  Patient presents with  . Follow-up    Patient is here today because she needs a colonoscopy since she has tubular adenoma of colon.    HPI: [name] is a 74 y.o. female with previous colonoscopy in 2019 with multiple colon polyps removed, and showed adenoma.  Please see procedure and pathology reports.  Repeat 2 years.  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  Patient is status post chemotherapy and radiation.  Patient also reporting heartburn about 2-3 times a week.  Takes Nexium as needed every 2 to 3 days and this resolves her symptoms.  Does not take it daily.  No dysphagia.  Previous upper endoscopy with biopsies of the GE junction did not reveal any intestinal metaplasia or goblet cells.  Current Outpatient Medications  Medication Sig Dispense Refill  . acetaminophen (TYLENOL) 500 MG tablet Take 1,000 mg by mouth 2 (two) times daily as needed for moderate pain or headache.    . allopurinol (ZYLOPRIM) 100 MG tablet Take 0.5 tablets (50 mg total) by mouth daily. 45 tablet 1  . amLODipine (NORVASC) 10 MG tablet Take 10 mg by mouth daily.    . Calcium Carb-Cholecalciferol (CALCIUM 600-D PO) Take 600 mg by mouth daily.    . hydroxychloroquine (PLAQUENIL) 200 MG tablet Take 200 mg by mouth 2 (two) times daily.     . polyethylene glycol (MIRALAX / GLYCOLAX) packet Take 17 g by mouth daily.    . predniSONE (DELTASONE) 5 MG tablet Take 5 mg by mouth daily with breakfast.     . simvastatin (ZOCOR) 40 MG tablet Take 40 mg by mouth daily.     . vitamin B-12 (CYANOCOBALAMIN) 500 MCG tablet Take 500 mcg by mouth daily.      Marland Kitchen alendronate (FOSAMAX) 70 MG tablet Take 70 mg by mouth every Saturday.     . clotrimazole-betamethasone (LOTRISONE) cream Apply 1 application topically 2 (two) times daily as needed (rash).     . ferrous sulfate 325 (65 FE) MG tablet Take 1 tablet (325 mg total) by mouth 2 (two) times daily with a meal. (Patient not taking: Reported on 12/05/2019) 60 tablet 3  . fluticasone (FLONASE) 50 MCG/ACT nasal spray Place 1 spray into both nostrils daily as needed for allergies or rhinitis.    . furosemide (LASIX) 40 MG tablet Take 40 mg by mouth daily.    Marland Kitchen lidocaine-prilocaine (EMLA) cream Apply to affected area once (Patient not taking: Reported on 12/05/2019) 30 g 3  . Multiple Vitamin (MULTIVITAMIN WITH MINERALS) TABS tablet Take 1 tablet by mouth daily. (Patient not taking: Reported on 12/05/2019) 180 tablet 0  . ondansetron (ZOFRAN) 4 MG tablet Take 1 tablet (4 mg total) by mouth 2 (two) times daily as needed for refractory nausea / vomiting. Start on day 3 after cyclophosphamide chemotherapy. (Patient not taking: Reported on 06/22/2019) 30 tablet 3  . Pseudoephedrine-APAP 30-325 MG TABS Take 1 tablet by mouth daily as needed (sinuses).     . sucralfate (CARAFATE) 1 g tablet TAKE 1 TABLET BY MOUTH 3 (THREE) TIMES DAILY. DISSOLVE IN 3-4 TBSP WARM WATER, SWISH AND SWALLOW (Patient not taking:  Reported on 09/17/2019) 270 tablet 2   No current facility-administered medications for this visit.    Allergies as of 12/05/2019 - Review Complete 12/05/2019  Allergen Reaction Noted  . Penicillins Other (See Comments) 02/28/2014  . Sulfa antibiotics Nausea Only 02/28/2014  . Sulfasalazine Nausea And Vomiting 03/07/2013  . Ace inhibitors Nausea Only 03/07/2013  . Losartan Anxiety 03/07/2013  . Spironolactone Other (See Comments) 04/25/2013    ROS:  General: Negative for anorexia, weight loss, fever, chills, fatigue, weakness. ENT: Negative for hoarseness, difficulty swallowing , nasal congestion. CV:  Negative for chest pain, angina, palpitations, dyspnea on exertion, peripheral edema.  Respiratory: Negative for dyspnea at rest, dyspnea on exertion, cough, sputum, wheezing.  GI: See history of present illness. GU:  Negative for dysuria, hematuria, urinary incontinence, urinary frequency, nocturnal urination.  Endo: Negative for unusual weight change.    Physical Examination:   BP (!) 150/87   Pulse (!) 105   Temp 97.6 F (36.4 C) (Oral)   Wt 121 lb 12.8 oz (55.2 kg)   BMI 25.46 kg/m   General: Well-nourished, well-developed in no acute distress.  Eyes: No icterus. Conjunctivae pink. Mouth: Oropharyngeal mucosa moist and pink , no lesions erythema or exudate. Neck: Supple, Trachea midline Abdomen: Bowel sounds are normal, nontender, nondistended, no hepatosplenomegaly or masses, no abdominal bruits or hernia , no rebound or guarding.   Extremities: No lower extremity edema. No clubbing or deformities. Neuro: Alert and oriented x 3.  Grossly intact. Skin: Warm and dry, no jaundice.   Psych: Alert and cooperative, normal mood and affect.   Labs: CMP     Component Value Date/Time   NA 138 09/17/2019 1339   K 3.6 09/17/2019 1339   CL 106 09/17/2019 1339   CO2 23 09/17/2019 1339   GLUCOSE 114 (H) 09/17/2019 1339   BUN 22 09/17/2019 1339   CREATININE 0.98 09/17/2019 1339   CREATININE 1.05 12/15/2011 1221   CALCIUM 8.7 (L) 09/17/2019 1339   PROT 6.6 09/17/2019 1339   ALBUMIN 4.1 09/17/2019 1339   AST 27 09/17/2019 1339   ALT 16 09/17/2019 1339   ALKPHOS 70 09/17/2019 1339   BILITOT 0.6 09/17/2019 1339   GFRNONAA 58 (L) 09/17/2019 1339   GFRNONAA 56 (L) 12/15/2011 1221   GFRAA >60 09/17/2019 1339   GFRAA >60 12/15/2011 1221   Lab Results  Component Value Date   WBC 7.3 09/17/2019   HGB 12.0 09/17/2019   HCT 37.4 09/17/2019   MCV 97.9 09/17/2019   PLT 198 09/17/2019    Imaging Studies: No results found.  Assessment and Plan:   [name] is a 74 y.o. y/o  female with previous history of colon polyps in 2019  Patient is due for polyp surveillance colonoscopy  Patient educated extensively on acid reflux lifestyle modification, including buying a bed wedge, not eating 3 hrs before bedtime, diet modifications, and handout given for the same.   Patient advised on taking Nexium daily for 2 months, once a day instead of as needed.  No alarm symptoms present to indicate upper endoscopy at this time.  In addition, previous upper endoscopies with biopsies have not shown any goblet cells or intestinal metaplasia in the distal esophagus/GE junction and therefore, upper endoscopy for reflux is unlikely to change management at this time and risks of procedure would outweigh benefits at this time.  However, if heartburn is not better after appropriate treatment, once daily PPI, upper endoscopy can be considered at that time  I  have discussed alternative options, risks & benefits,  which include, but are not limited to, bleeding, infection, perforation,respiratory complication & drug reaction.  The patient agrees with this plan & written consent will be obtained.       Dr Judy Torres

## 2019-12-07 DIAGNOSIS — H2512 Age-related nuclear cataract, left eye: Secondary | ICD-10-CM | POA: Diagnosis not present

## 2019-12-07 DIAGNOSIS — I10 Essential (primary) hypertension: Secondary | ICD-10-CM | POA: Diagnosis not present

## 2019-12-11 ENCOUNTER — Other Ambulatory Visit: Payer: Self-pay

## 2019-12-11 ENCOUNTER — Encounter: Payer: Self-pay | Admitting: Ophthalmology

## 2019-12-18 ENCOUNTER — Other Ambulatory Visit: Payer: Self-pay

## 2019-12-18 ENCOUNTER — Other Ambulatory Visit
Admission: RE | Admit: 2019-12-18 | Discharge: 2019-12-18 | Disposition: A | Discharge status: Home / Self Care | Payer: Medicare HMO | Source: Ambulatory Visit | Attending: Ophthalmology | Admitting: Ophthalmology

## 2019-12-18 DIAGNOSIS — Z01812 Encounter for preprocedural laboratory examination: Secondary | ICD-10-CM | POA: Insufficient documentation

## 2019-12-18 DIAGNOSIS — Z20822 Contact with and (suspected) exposure to covid-19: Secondary | ICD-10-CM | POA: Diagnosis not present

## 2019-12-18 LAB — SARS CORONAVIRUS 2 (TAT 6-24 HRS): SARS Coronavirus 2: NEGATIVE

## 2019-12-18 NOTE — Discharge Instructions (Signed)
General Anesthesia, Adult, Care After This sheet gives you information about how to care for yourself after your procedure. Your health care provider may also give you more specific instructions. If you have problems or questions, contact your health care provider. What can I expect after the procedure? After the procedure, the following side effects are common:  Pain or discomfort at the IV site.  Nausea.  Vomiting.  Sore throat.  Trouble concentrating.  Feeling cold or chills.  Weak or tired.  Sleepiness and fatigue.  Soreness and body aches. These side effects can affect parts of the body that were not involved in surgery. Follow these instructions at home:  For at least 24 hours after the procedure:  Have a responsible adult stay with you. It is important to have someone help care for you until you are awake and alert.  Rest as needed.  Do not: ? Participate in activities in which you could fall or become injured. ? Drive. ? Use heavy machinery. ? Drink alcohol. ? Take sleeping pills or medicines that cause drowsiness. ? Make important decisions or sign legal documents. ? Take care of children on your own. Eating and drinking  Follow any instructions from your health care provider about eating or drinking restrictions.  When you feel hungry, start by eating small amounts of foods that are soft and easy to digest (bland), such as toast. Gradually return to your regular diet.  Drink enough fluid to keep your urine pale yellow.  If you vomit, rehydrate by drinking water, juice, or clear broth. General instructions  If you have sleep apnea, surgery and certain medicines can increase your risk for breathing problems. Follow instructions from your health care provider about wearing your sleep device: ? Anytime you are sleeping, including during daytime naps. ? While taking prescription pain medicines, sleeping medicines, or medicines that make you drowsy.  Return to  your normal activities as told by your health care provider. Ask your health care provider what activities are safe for you.  Take over-the-counter and prescription medicines only as told by your health care provider.  If you smoke, do not smoke without supervision.  Keep all follow-up visits as told by your health care provider. This is important. Contact a health care provider if:  You have nausea or vomiting that does not get better with medicine.  You cannot eat or drink without vomiting.  You have pain that does not get better with medicine.  You are unable to pass urine.  You develop a skin rash.  You have a fever.  You have redness around your IV site that gets worse. Get help right away if:  You have difficulty breathing.  You have chest pain.  You have blood in your urine or stool, or you vomit blood. Summary  After the procedure, it is common to have a sore throat or nausea. It is also common to feel tired.  Have a responsible adult stay with you for the first 24 hours after general anesthesia. It is important to have someone help care for you until you are awake and alert.  When you feel hungry, start by eating small amounts of foods that are soft and easy to digest (bland), such as toast. Gradually return to your regular diet.  Drink enough fluid to keep your urine pale yellow.  Return to your normal activities as told by your health care provider. Ask your health care provider what activities are safe for you. This information is not   intended to replace advice given to you by your health care provider. Make sure you discuss any questions you have with your health care provider. Document Revised: 07/08/2017 Document Reviewed: 02/18/2017 Elsevier Patient Education  2020 [employer]  Cataract Surgery, Care After This sheet gives you information about how to care for yourself after your procedure. Your health care provider may also give you more specific  instructions. If you have problems or questions, contact your health care provider. What can I expect after the procedure? After the procedure, it is common to have:  Itching.  Discomfort.  Fluid discharge.  Sensitivity to light and to touch.  Bruising in or around the eye.  Mild blurred vision. Follow these instructions at home: Eye care   Do not touch or rub your eyes.  Protect your eyes as told by your health care provider. You may be told to wear a protective eye shield or sunglasses.  Do not put a contact lens into the affected eye or eyes until your health care provider approves.  Keep the area around your eye clean and dry: ? Avoid swimming. ? Do not allow water to hit you directly in the face while showering. ? Keep soap and shampoo out of your eyes.  Check your eye every day for signs of infection. Watch for: ? Redness, swelling, or pain. ? Fluid, blood, or pus. ? Warmth. ? A bad smell. ? Vision that is getting worse. ? Sensitivity that is getting worse. Activity  Do not drive for 24 hours if you were given a sedative during your procedure.  Avoid strenuous activities, such as playing contact sports, for as long as told by your health care provider.  Do not drive or use heavy machinery until your health care provider approves.  Do not bend or lift heavy objects. Bending increases pressure in the eye. You can walk, climb stairs, and do light household chores.  Ask your health care provider when you can return to work. If you work in a dusty environment, you may be advised to wear protective eyewear for a period of time. General instructions  Take or apply over-the-counter and prescription medicines only as told by your health care provider. This includes eye drops.  Keep all follow-up visits as told by your health care provider. This is important. Contact a health care provider if:  You have increased bruising around your eye.  You have pain that is  not helped with medicine.  You have a fever.  You have redness, swelling, or pain in your eye.  You have fluid, blood, or pus coming from your incision.  Your vision gets worse.  Your sensitivity to light gets worse. Get help right away if:  You have sudden loss of vision.  You see flashes of light or spots (floaters).  You have severe eye pain.  You develop nausea or vomiting. Summary  After your procedure, it is common to have itching, discomfort, bruising, fluid discharge, or sensitivity to light.  Follow instructions from your health care provider about caring for your eye after the procedure.  Do not rub your eye after the procedure. You may need to wear eye protection or sunglasses. Do not wear contact lenses. Keep the area around your eye clean and dry.  Avoid activities that require a lot of effort. These include playing sports and lifting heavy objects.  Contact a health care provider if you have increased bruising, pain that does not go away, or a fever. Get help right   away if you suddenly lose your vision, see flashes of light or spots, or have severe pain in the eye. This information is not intended to replace advice given to you by your health care provider. Make sure you discuss any questions you have with your health care provider. Document Revised: 05/01/2019 Document Reviewed: 01/02/2018 Elsevier Patient Education  2020 [employer] Inc.  

## 2019-12-19 ENCOUNTER — Telehealth: Payer: Self-pay | Admitting: Gastroenterology

## 2019-12-19 NOTE — Telephone Encounter (Signed)
Patient's Husband stated that they wanted the procedure and OV appt cancelled. He would call back to reschedule.

## 2019-12-20 ENCOUNTER — Other Ambulatory Visit: Payer: Self-pay

## 2019-12-20 ENCOUNTER — Ambulatory Visit
Admission: RE | Admit: 2019-12-20 | Discharge: 2019-12-20 | Disposition: A | Discharge status: Home / Self Care | Payer: Medicare HMO | Attending: Ophthalmology | Admitting: Ophthalmology

## 2019-12-20 ENCOUNTER — Ambulatory Visit: Payer: Medicare HMO | Admitting: Radiation Oncology

## 2019-12-20 ENCOUNTER — Encounter
Admission: RE | Disposition: A | Discharge status: Home / Self Care | Payer: Self-pay | Source: Home / Self Care | Attending: Ophthalmology

## 2019-12-20 ENCOUNTER — Encounter: Payer: Self-pay | Admitting: Ophthalmology

## 2019-12-20 ENCOUNTER — Ambulatory Visit: Payer: Medicare HMO | Admitting: Anesthesiology

## 2019-12-20 DIAGNOSIS — Z87891 Personal history of nicotine dependence: Secondary | ICD-10-CM | POA: Insufficient documentation

## 2019-12-20 DIAGNOSIS — Z880 Allergy status to penicillin: Secondary | ICD-10-CM | POA: Insufficient documentation

## 2019-12-20 DIAGNOSIS — Z951 Presence of aortocoronary bypass graft: Secondary | ICD-10-CM | POA: Diagnosis not present

## 2019-12-20 DIAGNOSIS — M329 Systemic lupus erythematosus, unspecified: Secondary | ICD-10-CM | POA: Diagnosis not present

## 2019-12-20 DIAGNOSIS — H2512 Age-related nuclear cataract, left eye: Secondary | ICD-10-CM | POA: Insufficient documentation

## 2019-12-20 DIAGNOSIS — M109 Gout, unspecified: Secondary | ICD-10-CM | POA: Diagnosis not present

## 2019-12-20 DIAGNOSIS — E7800 Pure hypercholesterolemia, unspecified: Secondary | ICD-10-CM | POA: Diagnosis not present

## 2019-12-20 DIAGNOSIS — M810 Age-related osteoporosis without current pathological fracture: Secondary | ICD-10-CM | POA: Insufficient documentation

## 2019-12-20 DIAGNOSIS — I10 Essential (primary) hypertension: Secondary | ICD-10-CM | POA: Insufficient documentation

## 2019-12-20 DIAGNOSIS — Z86010 Personal history of colonic polyps: Secondary | ICD-10-CM

## 2019-12-20 DIAGNOSIS — H25812 Combined forms of age-related cataract, left eye: Secondary | ICD-10-CM | POA: Diagnosis not present

## 2019-12-20 HISTORY — PX: CATARACT EXTRACTION W/PHACO: SHX586

## 2019-12-20 SURGERY — PHACOEMULSIFICATION, CATARACT, WITH IOL INSERTION
Anesthesia: Monitor Anesthesia Care | Site: Eye | Laterality: Left

## 2019-12-20 MED ORDER — LIDOCAINE HCL (PF) 2 % IJ SOLN
INTRAOCULAR | Status: DC | PRN
  Administered 2019-12-20: 1 mL via INTRAOCULAR

## 2019-12-20 MED ORDER — MOXIFLOXACIN HCL 0.5 % OP SOLN
OPHTHALMIC | Status: DC | PRN
  Administered 2019-12-20: 0.2 mL via OPHTHALMIC

## 2019-12-20 MED ORDER — TETRACAINE 0.5 % OP SOLN OPTIME - NO CHARGE
OPHTHALMIC | Status: DC | PRN
  Administered 2019-12-20: 2 [drp] via OPHTHALMIC

## 2019-12-20 MED ORDER — BRIMONIDINE TARTRATE-TIMOLOL 0.2-0.5 % OP SOLN
OPHTHALMIC | Status: DC | PRN
  Administered 2019-12-20: 1 [drp] via OPHTHALMIC

## 2019-12-20 MED ORDER — EPINEPHRINE PF 1 MG/ML IJ SOLN
INTRAOCULAR | Status: DC | PRN
  Administered 2019-12-20: 58 mL via OPHTHALMIC

## 2019-12-20 MED ORDER — ARMC OPHTHALMIC DILATING DROPS
1.0000 "application " | OPHTHALMIC | Status: DC | PRN
  Administered 2019-12-20 (×3): 1 via OPHTHALMIC

## 2019-12-20 MED ORDER — MIDAZOLAM HCL 2 MG/2ML IJ SOLN
INTRAMUSCULAR | Status: DC | PRN
  Administered 2019-12-20: 1 mg via INTRAVENOUS

## 2019-12-20 MED ORDER — PROVISC 10 MG/ML IO SOLN
INTRAOCULAR | Status: DC | PRN
  Administered 2019-12-20: 0.55 mL via INTRAOCULAR

## 2019-12-20 MED ORDER — SODIUM CHLORIDE 0.9 % IV SOLN: INTRAVENOUS | Status: DC

## 2019-12-20 MED ORDER — FENTANYL CITRATE (PF) 100 MCG/2ML IJ SOLN
INTRAMUSCULAR | Status: DC | PRN
  Administered 2019-12-20: 50 ug via INTRAVENOUS

## 2019-12-20 MED ORDER — TRYPAN BLUE 0.06 % OP SOLN
OPHTHALMIC | Status: DC | PRN
  Administered 2019-12-20: 0.5 mL via INTRAOCULAR

## 2019-12-20 MED ORDER — TETRACAINE HCL 0.5 % OP SOLN
1.0000 [drp] | OPHTHALMIC | Status: DC | PRN
  Administered 2019-12-20 (×3): 1 [drp] via OPHTHALMIC

## 2019-12-20 MED ORDER — NA CHONDROIT SULF-NA HYALURON 40-17 MG/ML IO SOLN
INTRAOCULAR | Status: DC | PRN
  Administered 2019-12-20: 1 mL via INTRAOCULAR

## 2019-12-20 MED ORDER — NEOMYCIN-POLYMYXIN-DEXAMETH 3.5-10000-0.1 OP OINT
TOPICAL_OINTMENT | OPHTHALMIC | Status: DC | PRN
  Administered 2019-12-20: 1 via OPHTHALMIC

## 2019-12-20 SURGICAL SUPPLY — 19 items
DISSECTOR HYDRO NUCLEUS 50X22 (MISCELLANEOUS) ×12 IMPLANT
DRSG TEGADERM 2-3/8X2-3/4 SM (GAUZE/BANDAGES/DRESSINGS) ×3 IMPLANT
GLOVE BIOGEL PI IND STRL 8 (GLOVE) ×1 IMPLANT
GLOVE BIOGEL PI INDICATOR 8 (GLOVE) ×4
GOWN STRL REUS W/ TWL LRG LVL3 (GOWN DISPOSABLE) ×1 IMPLANT
GOWN STRL REUS W/ TWL XL LVL3 (GOWN DISPOSABLE) ×1 IMPLANT
GOWN STRL REUS W/TWL LRG LVL3 (GOWN DISPOSABLE) ×3
GOWN STRL REUS W/TWL XL LVL3 (GOWN DISPOSABLE) ×3
KNIFE 45D UP 2.3 (MISCELLANEOUS) ×3 IMPLANT
LENS IOL DIOP 20.0 (Intraocular Lens) ×3 IMPLANT
LENS IOL TECNIS MONO 20.0 (Intraocular Lens) IMPLANT
MARKER SKIN DUAL TIP RULER LAB (MISCELLANEOUS) ×3 IMPLANT
PACK CATARACT (MISCELLANEOUS) ×3 IMPLANT
PACK DR. KING ARMS (PACKS) ×3 IMPLANT
PACK EYE AFTER SURG (MISCELLANEOUS) ×3 IMPLANT
SOLUTION OPHTHALMIC SALT (MISCELLANEOUS) ×3 IMPLANT
WATER STERILE IRR 250ML POUR (IV SOLUTION) ×3 IMPLANT
WICK EYE OCUCEL (MISCELLANEOUS) ×2 IMPLANT
WIPE NON LINTING 3.25X3.25 (MISCELLANEOUS) ×3 IMPLANT

## 2019-12-20 NOTE — H&P (Signed)
   I have reviewed the patient's H&P and agree with its findings. There have been no interval changes.  Judy Nakagawa MD Ophthalmology 

## 2019-12-20 NOTE — Anesthesia Procedure Notes (Signed)
Procedure Name: MAC Performed by: Landers Prajapati, CRNA Pre-anesthesia Checklist: Patient identified, Emergency Drugs available, Suction available, Timeout performed and Patient being monitored Patient Re-evaluated:Patient Re-evaluated prior to induction Oxygen Delivery Method: Nasal cannula Placement Confirmation: positive ETCO2       

## 2019-12-20 NOTE — Op Note (Signed)
  PREOPERATIVE DIAGNOSIS:  Nuclear sclerotic cataract of the LEFT eye.   POSTOPERATIVE DIAGNOSIS:  Nuclear sclerotic cataract of the LEFT eye.   OPERATIVE PROCEDURE: Cataract surgery OS   SURGEON:  Marchia Meiers, MD.   ANESTHESIA:  Anesthesiologist: Alisa Graff, MD CRNA: Mayme Genta, CRNA; Cameron Ali, CRNA  1.      Managed anesthesia care. 2.     0.31ml of Shugarcaine was instilled following the paracentesis   COMPLICATIONS:  None.   TECHNIQUE:   Divide and conquer   DESCRIPTION OF PROCEDURE:  The patient was examined and consented in the preoperative holding area where the aforementioned topical anesthesia was applied to the LEFT eye and then brought back to the Operating Room where the left eye was prepped and draped in the usual sterile ophthalmic fashion and a lid speculum was placed. A paracentesis was created with the side port blade, the anterior chamber was washed out with trypan blue to stain the anterior capsule, and the anterior chamber was filled with viscoelastic. A near clear corneal incision was performed with the steel keratome. A continuous curvilinear capsulorrhexis was performed with a cystotome followed by the capsulorrhexis forceps. Hydrodissection and hydrodelineation were carried out with BSS on a blunt cannula. The lens was removed in a divide and conquer  technique and the remaining cortical material was removed with the irrigation-aspiration handpiece. The capsular bag was inflated with viscoelastic and the lens was placed in the capsular bag without complication. The remaining viscoelastic was removed from the eye with the irrigation-aspiration handpiece. The wounds were hydrated. The anterior chamber was flushed and the eye was inflated to physiologic pressure. 0.59ml Vigamox was placed in the anterior chamber. The wounds were found to be water tight. The eye was dressed with Vigamox. The patient was given protective glasses to wear throughout the day and a  shield with which to sleep tonight. The patient was also given drops with which to begin a drop regimen today and will follow-up with me in one day. Implant Name Type Inv. Item Serial No. Manufacturer Lot No. LRB No. Used Action  LENS IOL DIOP 20.0 - [id number] Intraocular Lens LENS IOL DIOP 20.0 [id number] AMO  Left 1 Implanted    Procedure(s) with comments: CATARACT EXTRACTION PHACO AND INTRAOCULAR LENS PLACEMENT ([facility]) LEFT VISION BLUE (Left) - 6.17 0:36.1  Electronically signed: Khadeem Rockett 12/20/2019 11:05 AM

## 2019-12-20 NOTE — Anesthesia Preprocedure Evaluation (Signed)
Anesthesia Evaluation  Patient identified by MRN, date of birth, ID band Patient awake    Reviewed: Allergy & Precautions, H&P , NPO status , Patient's Chart, lab work & pertinent test results, reviewed documented beta blocker date and time   Airway Mallampati: II  TM Distance: >3 FB Neck ROM: full    Dental no notable dental hx. (+) Upper Dentures, Lower Dentures   Pulmonary COPD, former smoker,    Pulmonary exam normal breath sounds clear to auscultation       Cardiovascular Exercise Tolerance: Good hypertension, + CAD, + Cardiac Stents and + Peripheral Vascular Disease (AAA)   Rhythm:regular Rate:Normal     Neuro/Psych negative neurological ROS  negative psych ROS   GI/Hepatic Neg liver ROS, PUD, GERD  ,  Endo/Other  negative endocrine ROS  Renal/GU CRFRenal disease  negative genitourinary   Musculoskeletal   Abdominal   Peds  Hematology negative hematology ROS (+)   Anesthesia Other Findings   Reproductive/Obstetrics negative OB ROS                             Anesthesia Physical Anesthesia Plan  ASA: III  Anesthesia Plan: MAC   Post-op Pain Management:    Induction:   PONV Risk Score and Plan: 2 and TIVA and Treatment may vary due to age or medical condition  Airway Management Planned:   Additional Equipment:   Intra-op Plan:   Post-operative Plan:   Informed Consent: I have reviewed the patients History and Physical, chart, labs and discussed the procedure including the risks, benefits and alternatives for the proposed anesthesia with the patient or authorized representative who has indicated his/her understanding and acceptance.     Dental Advisory Given  Plan Discussed with: CRNA  Anesthesia Plan Comments:         Anesthesia Quick Evaluation

## 2019-12-20 NOTE — Transfer of Care (Signed)
Immediate Anesthesia Transfer of Care Note  Patient: Judy Torres  Procedure(s) Performed: CATARACT EXTRACTION PHACO AND INTRAOCULAR LENS PLACEMENT ([place]) LEFT VISION BLUE (Left Eye)  Patient Location: PACU  Anesthesia Type: MAC  Level of Consciousness: awake, alert  and patient cooperative  Airway and Oxygen Therapy: Patient Spontanous Breathing and Patient connected to supplemental oxygen  Post-op Assessment: Post-op Vital signs reviewed, Patient's Cardiovascular Status Stable, Respiratory Function Stable, Patent Airway and No signs of Nausea or vomiting  Post-op Vital Signs: Reviewed and stable  Complications: No apparent anesthesia complications

## 2019-12-20 NOTE — Anesthesia Postprocedure Evaluation (Signed)
Anesthesia Post Note  Patient: Judy Torres  Procedure(s) Performed: CATARACT EXTRACTION PHACO AND INTRAOCULAR LENS PLACEMENT ([name]) LEFT VISION BLUE (Left Eye)     Patient location during evaluation: PACU Anesthesia Type: MAC Level of consciousness: awake and alert Pain management: pain level controlled Vital Signs Assessment: post-procedure vital signs reviewed and stable Respiratory status: spontaneous breathing, nonlabored ventilation, respiratory function stable and patient connected to nasal cannula oxygen Cardiovascular status: stable and blood pressure returned to baseline Postop Assessment: no apparent nausea or vomiting Anesthetic complications: no    Alisa Graff

## 2019-12-21 ENCOUNTER — Encounter: Payer: Self-pay | Admitting: *Deleted

## 2019-12-24 ENCOUNTER — Ambulatory Visit: Admission: RE | Admit: 2019-12-24 | Payer: Medicare HMO | Source: Home / Self Care | Admitting: Gastroenterology

## 2019-12-24 ENCOUNTER — Encounter: Admission: RE | Payer: Self-pay | Source: Home / Self Care

## 2019-12-24 SURGERY — COLONOSCOPY WITH PROPOFOL
Anesthesia: General

## 2019-12-30 IMAGING — CR DG CHEST 2V
1 series · 2 of 2 positions shown · non-contrast
Comparison: Chest x-ray of January 15, 2011

CLINICAL DATA: New onset of fever, history of hypertension, former
smoker, lupus.

EXAM:
CHEST - 2 VIEW

[Series 1: dg chest 2 view · 0.14mm/px · 2 of 2 slices shown]
[im 1/2]
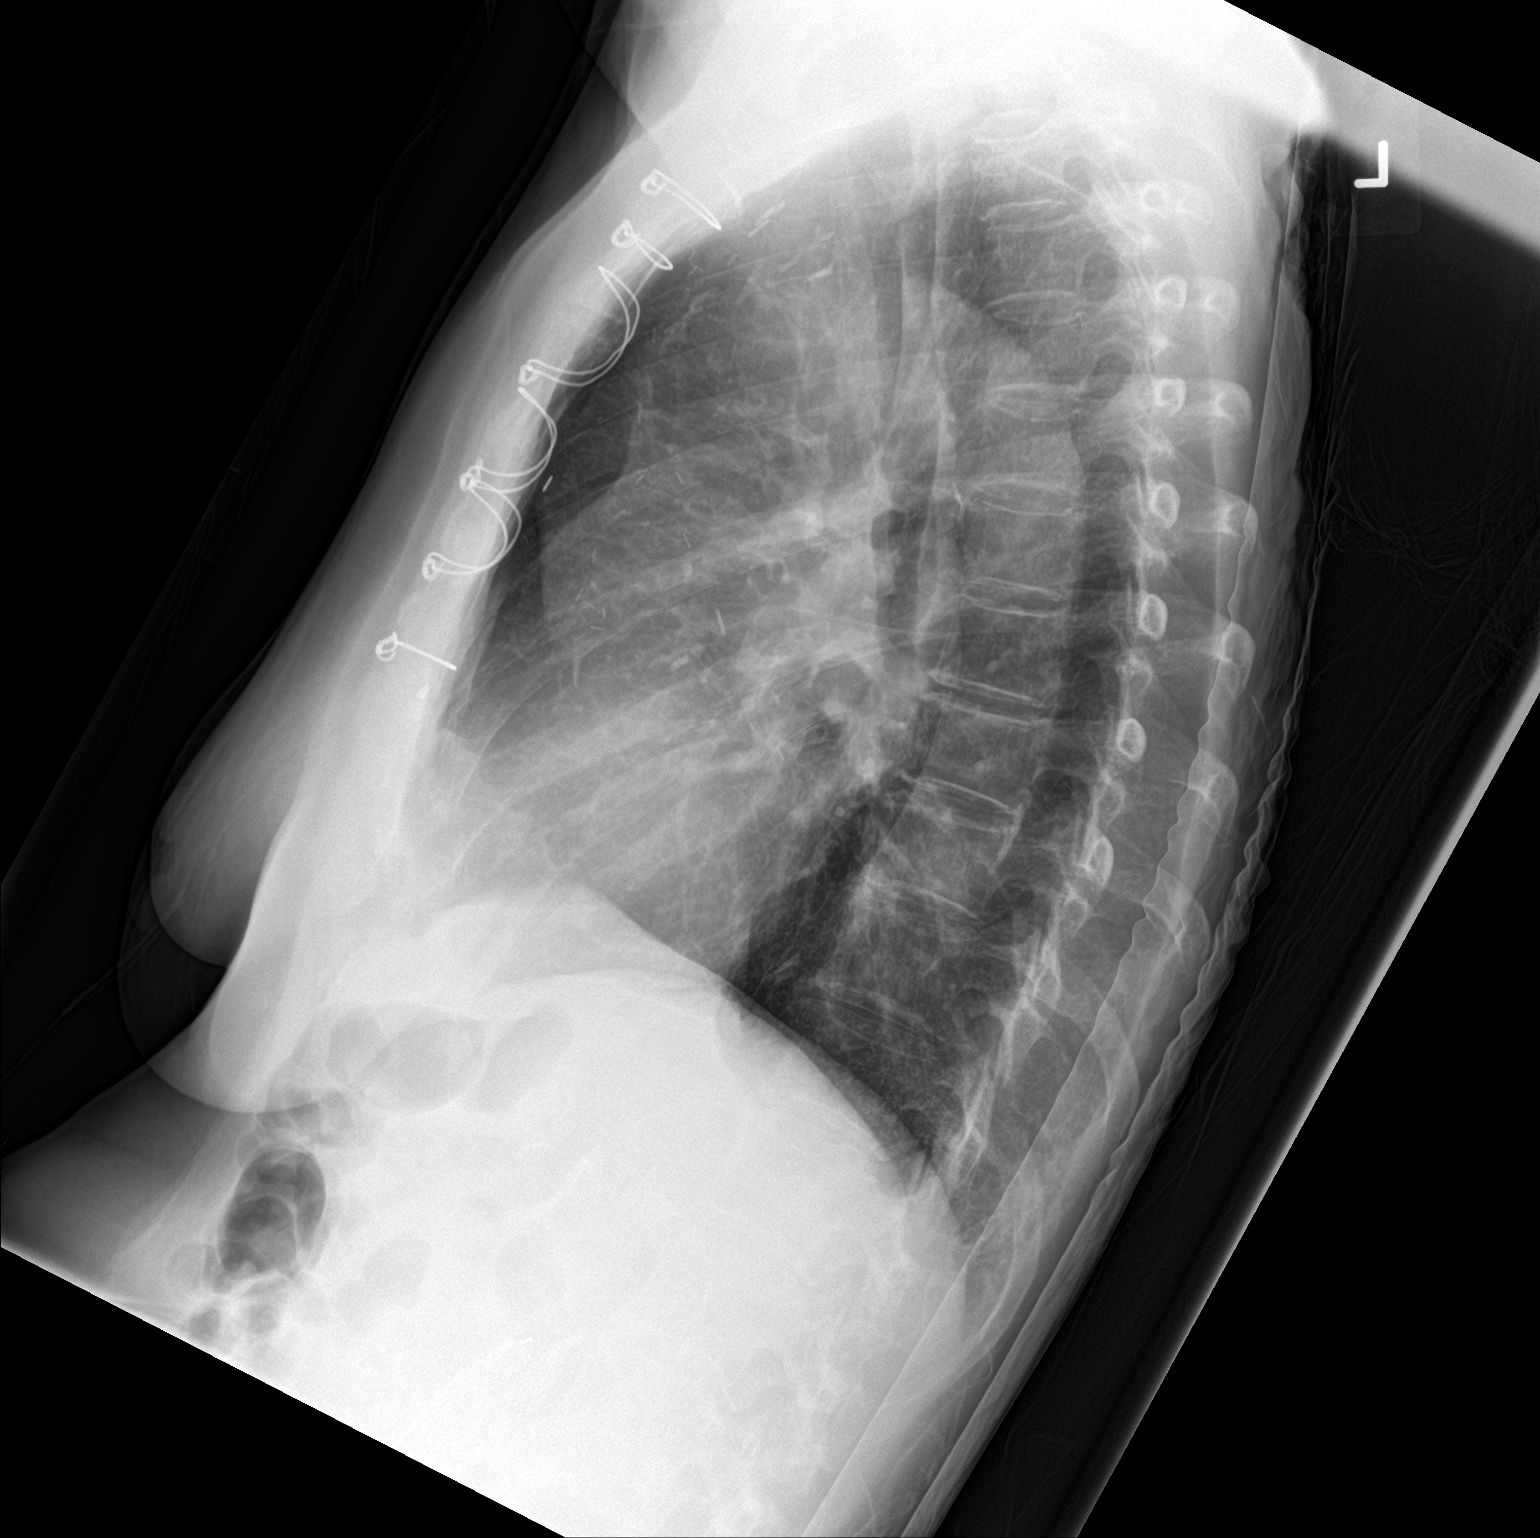
[im 2/2]
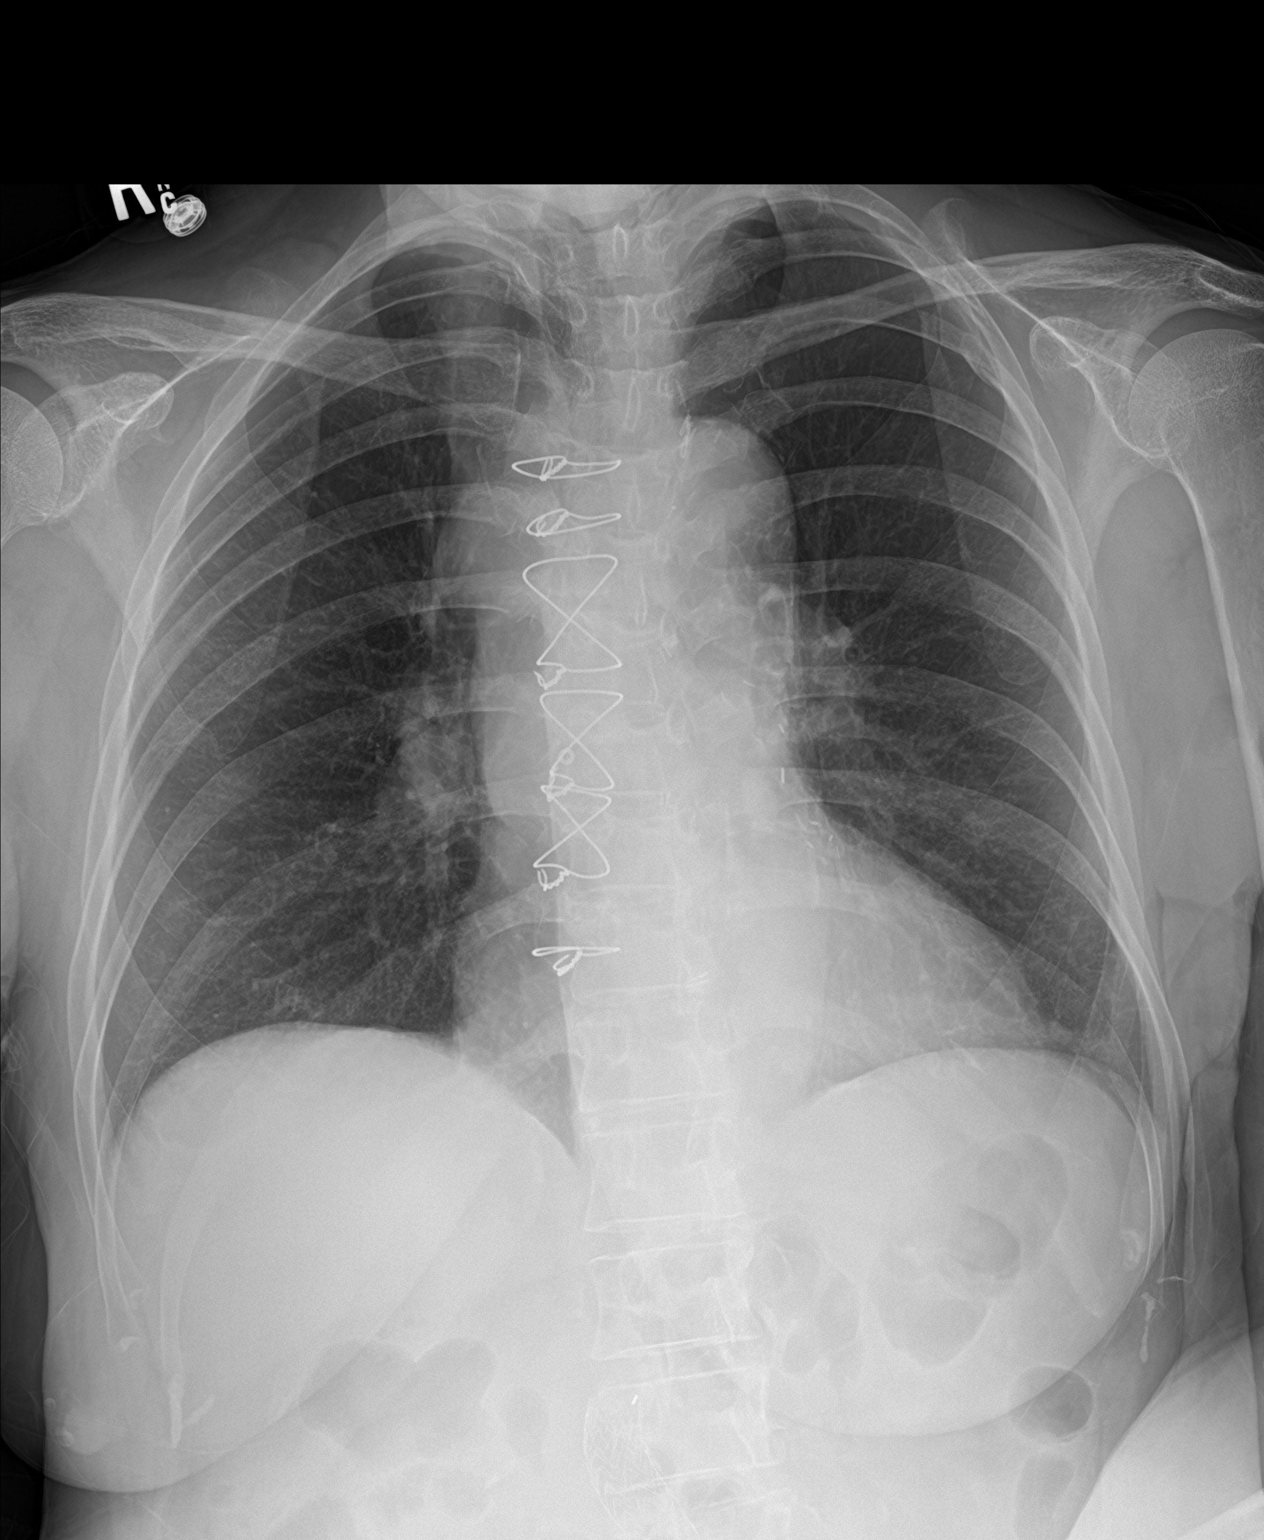

[2 of 2 positions shown; findings below may reference images not displayed]

FINDINGS: The lungs are adequately inflated. There is no focal infiltrate.
There is no pleural effusion. The heart and pulmonary vascularity
are normal. There is new abnormal soft tissue density in the lower
right paratracheal region. The surgical wires are intact from median
sternotomy. There is calcification in the wall of the descending
thoracic aorta. The observed bony thorax is unremarkable.
IMPRESSION: No evidence of pneumonia nor CHF. New abnormal soft tissue density
in the lower right paratracheal region is worrisome for
lymphadenopathy or other mass. Chest CT scanning is recommended.

## 2020-01-09 ENCOUNTER — Ambulatory Visit: Payer: Medicare HMO | Admitting: Radiation Oncology

## 2020-01-30 ENCOUNTER — Ambulatory Visit: Payer: Medicare HMO | Admitting: Gastroenterology

## 2020-02-01 ENCOUNTER — Ambulatory Visit
Admission: RE | Admit: 2020-02-01 | Discharge: 2020-02-01 | Disposition: A | Discharge status: Home / Self Care | Payer: Medicare HMO | Source: Ambulatory Visit | Attending: Radiation Oncology | Admitting: Radiation Oncology

## 2020-02-01 ENCOUNTER — Encounter: Payer: Self-pay | Admitting: Radiation Oncology

## 2020-02-01 ENCOUNTER — Other Ambulatory Visit: Payer: Self-pay

## 2020-02-01 VITALS — BP 148/81 | HR 86 | Temp 97.8°F | Resp 20 | Wt 120.2 lb

## 2020-02-01 DIAGNOSIS — Z923 Personal history of irradiation: Secondary | ICD-10-CM | POA: Diagnosis not present

## 2020-02-01 DIAGNOSIS — Z8572 Personal history of non-Hodgkin lymphomas: Secondary | ICD-10-CM | POA: Insufficient documentation

## 2020-02-01 DIAGNOSIS — Z9221 Personal history of antineoplastic chemotherapy: Secondary | ICD-10-CM | POA: Insufficient documentation

## 2020-02-01 DIAGNOSIS — C8339 Diffuse large B-cell lymphoma, extranodal and solid organ sites: Secondary | ICD-10-CM

## 2020-02-01 NOTE — Progress Notes (Signed)
Radiation Oncology Follow up Note  Name: Judy Torres   Date:   02/01/2020 MRN:  [id number] DOB: 05/10/1946    This 74 y.o. female presents to the clinic today for 1 year follow-up status post involved field radiation therapy and patient with known stage IVb diffuse large B-cell lymphoma.  REFERRING PROVIDER: [name], MD  HPI: Patient is a 74 year old female now out 1 year having completed involved field radiation therapy to her mediastinum for stage IV diffuse large B-cell lymphoma.  Patient also had 6 cycles of mini R-CHOP with a partial response to treatment then underwent bowel field radiation she is seen today Judy routine follow-up is doing well specifically [place] weight loss cough fever chills night sweats.  She is having no dysphagia.  Her most recent CT scan.  Back Judy March showed no findings of active lymphoma she had a precarinal lymph node measuring 0.6 cm which was previously 0.7 cm.  She is currently under active surveillance by medical oncology.  COMPLICATIONS OF TREATMENT: none  FOLLOW UP COMPLIANCE: keeps appointments   PHYSICAL EXAM:  BP (!) 148/81   Pulse 86   Temp 97.8 F (36.6 C)   Resp 20   Wt 120 lb 3.2 oz (54.5 kg)   SpO2 100%   BMI 25.12 kg/m  Well-developed well-nourished patient Judy NAD. HEENT reveals PERLA, EOMI, discs not visualized.  Oral cavity is clear. No oral mucosal lesions are identified. Neck is clear without evidence of cervical or supraclavicular adenopathy. Lungs are clear to A&P. Cardiac examination is essentially unremarkable with regular rate and rhythm without murmur rub or thrill. Abdomen is benign with no organomegaly or masses noted. Motor sensory and DTR levels are equal and symmetric Judy the upper and lower extremities. Cranial nerves II through XII are grossly intact. Proprioception is intact. No peripheral adenopathy or edema is identified. No motor or sensory levels are noted. Crude visual fields are within normal range.  No  peripheral adenopathy is identified  RADIOLOGY RESULTS: CT scans reviewed compatible with above-stated findings  PLAN: Present time patient continues to do well 1 year out with no evidence of disease and pleased with her overall progress she is low side effect profile.  She continues close follow-up care and monitoring by medical oncology.  I have asked to see her back Judy 1 year for follow-up.  Patient knows to call with any concerns at any time.  I would like to take this opportunity to thank you for allowing me to participate Judy the care of your patient.Noreene Filbert, MD

## 2020-02-06 NOTE — Progress Notes (Signed)
[facility]  [address], [address] [place], [place] [address] Phone: [phone]  Fax: [phone]  Office Visit:  02/07/2020   Referring physician: [name], MD  Chief Complaint: [name] is a 74 y.o. 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  HPI: The patient was last seen in the medical oncology clinic on 10/03/2019. At that time, she was doing well.  She noted some reflux.  Exam revealed no adenopathy or hepatosplenomegaly.  Labs revealed a hematocrit 37.4, hemoglobin 12.0, MCV 97.9, platelets 198,000, WBC 7300, ANC 5300.  Uric acid was 7.6.  CT scans revealed no evidence of recurrent disease.  We discussed ongoing surveillance.  She underwent cataract extraction and intraocular lens placement on 11/15/2019 (right) and 12/20/2019 (left).   Patient seen by Dr. Bonna Gains on 12/05/2019.  Patient noted to be due for surveillance colonoscopy.  Education provided on reflux.  She was to take Nexium daily.   She saw Dr Baruch Gouty on 02/01/2020.  She was doing well.  Follow-up is scheduled in 1 year.  During the interim, she has been well. She notes that her heartburn has resolved with diet alone. She has not followed up with a cardiologist; she did have one quite some time ago but was lost to follow up. She does follow up with her rheumatologist, Dr. Jefm Bryant.   She is fully vaccinated against COVID-19. She denies any difficulties with the vaccines.    Past Medical History:  Diagnosis Date  . Cancer [facility])    Stomach cancer  . Chronic kidney disease   . GERD (gastroesophageal reflux disease)   . HTN (hypertension)   . Hyperlipidemia   . Lupus ([place])   . S/P angioplasty with stent   . Wears dentures    full upper and lower    Past Surgical History:  Procedure Laterality Date  . CARDIAC CATHETERIZATION     stent  . CARDIAC SURGERY    . CATARACT EXTRACTION W/PHACO Right 11/15/2019   Procedure:  CATARACT EXTRACTION PHACO AND INTRAOCULAR LENS PLACEMENT (IOC) RIGHT  VISION BLUE 7.48 00:45.8;  Surgeon: Marchia Meiers, MD;  Location: [place];  Service: Ophthalmology;  Laterality: Right;  Port a cath  . CATARACT EXTRACTION W/PHACO Left 12/20/2019   Procedure: CATARACT EXTRACTION PHACO AND INTRAOCULAR LENS PLACEMENT ([place]) LEFT VISION BLUE;  Surgeon: Marchia Meiers, MD;  Location: [place];  Service: Ophthalmology;  Laterality: Left;  6.17 0:36.1  . CESAREAN SECTION    . COLONOSCOPY N/A 12/30/2017   Procedure: COLONOSCOPY;  Surgeon: Lin Landsman, MD;  Location: [facility] ENDOSCOPY;  Service: Gastroenterology;  Laterality: N/A;  . CORONARY ARTERY BYPASS GRAFT  2003  . ESOPHAGOGASTRODUODENOSCOPY N/A 12/30/2017   Procedure: ESOPHAGOGASTRODUODENOSCOPY (EGD);  Surgeon: Lin Landsman, MD;  Location: [facility] ENDOSCOPY;  Service: Gastroenterology;  Laterality: N/A;  . ESOPHAGOGASTRODUODENOSCOPY Left 02/05/2018   Procedure: ESOPHAGOGASTRODUODENOSCOPY (EGD);  Surgeon: Virgel Manifold, MD;  Location: [facility] ENDOSCOPY;  Service: Endoscopy;  Laterality: Left;  . ESOPHAGOGASTRODUODENOSCOPY (EGD) WITH PROPOFOL N/A 03/15/2018   Procedure: ESOPHAGOGASTRODUODENOSCOPY (EGD) WITH PROPOFOL;  Surgeon: Virgel Manifold, MD;  Location: [facility] ENDOSCOPY;  Service: Endoscopy;  Laterality: N/A;  . FLEXIBLE SIGMOIDOSCOPY N/A 12/31/2017   Procedure: FLEXIBLE SIGMOIDOSCOPY;  Surgeon: Jonathon Bellows, MD;  Location: [facility] ENDOSCOPY;  Service: Gastroenterology;  Laterality: N/A;  . PORTA CATH INSERTION N/A 04/19/2018   Procedure: PORTA CATH INSERTION;  Surgeon: Algernon Huxley, MD;  Location: [facility] INVASIVE CV  LAB;  Service: Cardiovascular;  Laterality: N/A;    Family History  Problem Relation Age of Onset  . Hypertension Mother   . Stroke Mother   . Vascular Disease Mother   . Hypertension Sister     Social History:  reports that she quit smoking about 17 years ago. Her smoking use included cigarettes.  She has a 10.00 pack-year smoking history. She has never used smokeless tobacco. She reports previous alcohol use. She reports that she does not use drugs. She has smoked 1/2 pack/day x 20 years. She stopped smoking in 2000. She denies any exposure to radiation or toxins. She is from [place]. She moved to the [place] 52 years ago after marrying her husband. She was in the [employer]. Her husband phone([phone]). The patient is alone today.    Allergies:  Allergies  Allergen Reactions  . Penicillins Other (See Comments)    Pass out Has patient had a PCN reaction causing immediate rash, facial/tongue/throat swelling, SOB or lightheadedness with hypotension: Yes Has patient had a PCN reaction causing severe rash involving mucus membranes or skin necrosis: No Has patient had a PCN reaction that required hospitalization: No Has patient had a PCN reaction occurring within the last 10 years: No If all of the above answers are "NO", then may proceed with Cephalosporin use.   . Sulfa Antibiotics Nausea Only  . Sulfasalazine Nausea And Vomiting       . Ace Inhibitors Nausea Only  . Losartan Anxiety  . Spironolactone Other (See Comments)    Felt poorly but couldn't clarify     Current Medications: Current Outpatient Medications  Medication Sig Dispense Refill  . acetaminophen (TYLENOL) 500 MG tablet Take 1,000 mg by mouth 2 (two) times daily as needed for moderate pain or headache.    . alendronate (FOSAMAX) 70 MG tablet Take 70 mg by mouth every Saturday.     Marland Kitchen allopurinol (ZYLOPRIM) 100 MG tablet Take 0.5 tablets (50 mg total) by mouth daily. 45 tablet 1  . amLODipine (NORVASC) 10 MG tablet Take 10 mg by mouth daily.    . Calcium Carb-Cholecalciferol (CALCIUM 600-D PO) Take 600 mg by mouth daily.    . clotrimazole-betamethasone (LOTRISONE) cream Apply 1 application topically 2 (two) times daily as needed (rash).     Marland Kitchen esomeprazole (NEXIUM) 20 MG packet Take 20 mg by mouth daily  before breakfast.    . ferrous sulfate 325 (65 FE) MG tablet Take 1 tablet (325 mg total) by mouth 2 (two) times daily with a meal. 60 tablet 3  . fluticasone (FLONASE) 50 MCG/ACT nasal spray Place 1 spray into both nostrils daily as needed for allergies or rhinitis.    . furosemide (LASIX) 40 MG tablet Take 40 mg by mouth daily.    . hydroxychloroquine (PLAQUENIL) 200 MG tablet Take 200 mg by mouth 2 (two) times daily.     Marland Kitchen lidocaine-prilocaine (EMLA) cream Apply to affected area once 30 g 3  . Multiple Vitamin (MULTIVITAMIN WITH MINERALS) TABS tablet Take 1 tablet by mouth daily. 180 tablet 0  . ondansetron (ZOFRAN) 4 MG tablet Take 1 tablet (4 mg total) by mouth 2 (two) times daily as needed for refractory nausea / vomiting. Start on day 3 after cyclophosphamide chemotherapy. 30 tablet 3  . polyethylene glycol (MIRALAX / GLYCOLAX) packet Take 17 g by mouth daily.    . predniSONE (DELTASONE) 5 MG tablet Take 5 mg by mouth daily with breakfast.     . Pseudoephedrine-APAP  30-325 MG TABS Take 1 tablet by mouth daily as needed (sinuses).     . simvastatin (ZOCOR) 40 MG tablet Take 40 mg by mouth daily.     . sucralfate (CARAFATE) 1 g tablet TAKE 1 TABLET BY MOUTH 3 (THREE) TIMES DAILY. DISSOLVE IN 3-4 TBSP WARM WATER, SWISH AND SWALLOW 270 tablet 2  . vitamin B-12 (CYANOCOBALAMIN) 500 MCG tablet Take 500 mcg by mouth daily.     No current facility-administered medications for this visit.    Review of Systems  Constitutional: Negative.  Negative for chills, diaphoresis, fever, malaise/fatigue and weight loss (stable).       Feels "pretty good".  HENT: Negative.  Negative for congestion, ear pain, nosebleeds, sinus pain and sore throat.   Eyes: Negative.  Negative for blurred vision, double vision and photophobia.       Distant vision improved s/p cataract removal, but she still has trouble seeing close.  Respiratory: Positive for cough (seasonal). Negative for hemoptysis, sputum production and  shortness of breath.   Cardiovascular: Positive for leg swelling (chronic BILATERAL ankles). Negative for chest pain, palpitations, orthopnea and PND.       Serial echoes revealed declining EF.  Gastrointestinal: Negative for abdominal pain, blood in stool, constipation, diarrhea, heartburn (improved), melena, nausea and vomiting.       Eating well.  H/o multiple tubular adenomas on colonoscopy 12/2017.  Genitourinary: Negative.  Negative for dysuria, frequency, hematuria and urgency.  Musculoskeletal: Negative.  Negative for back pain, falls, joint pain, myalgias and neck pain.  Skin: Negative.  Negative for itching and rash.  Neurological: Negative.  Negative for dizziness, tingling, tremors, sensory change, speech change, focal weakness, weakness and headaches.  Endo/Heme/Allergies: Negative.  Does not bruise/bleed easily.  Psychiatric/Behavioral: Negative.  Negative for depression and memory loss. The patient is not nervous/anxious and does not have insomnia.   All other systems reviewed and are negative.  Performance status (ECOG): 0 - Asymptomatic   Blood pressure (!) 152/78, pulse 88, temperature (!) 97.1 F (36.2 C), temperature source Tympanic, resp. rate 18, height 4\' 11"  (1.499 m), weight 123 lb 10.9 oz (56.1 kg), SpO2 100 %.   Physical Exam Vitals reviewed.  Constitutional:      General: She is not in acute distress.    Appearance: Normal appearance. She is well-developed. She is not diaphoretic.     Interventions: Face mask in place.  HENT:     Head: Normocephalic and atraumatic.     Comments: Curly black hair.    Mouth/Throat:     Mouth: Mucous membranes are moist. No oral lesions.  Eyes:     Conjunctiva/sclera: Conjunctivae normal.     Pupils: Pupils are equal, round, and reactive to light.     Comments: Glasses.  Brown eyes.  Neck:     Vascular: No carotid bruit.  Cardiovascular:     Rate and Rhythm: Normal rate and regular rhythm.     Pulses: Normal pulses.      Heart sounds: Normal heart sounds. No murmur heard.  No friction rub. No gallop.   Pulmonary:     Effort: Pulmonary effort is normal.     Breath sounds: Normal breath sounds. No wheezing, rhonchi or rales.  Abdominal:     General: Bowel sounds are normal.     Palpations: Abdomen is soft. There is no hepatomegaly, splenomegaly or mass.     Tenderness: There is no abdominal tenderness.     Hernia: No hernia is present.  Musculoskeletal:  General: No tenderness. Normal range of motion.     Cervical back: Normal range of motion and neck supple.     Right lower leg: No edema.     Left lower leg: No edema.  Lymphadenopathy:     Head:     Right side of head: No preauricular, posterior auricular or occipital adenopathy.     Left side of head: No preauricular, posterior auricular or occipital adenopathy.     Cervical: No cervical adenopathy.     Upper Body:     Right upper body: No supraclavicular or axillary adenopathy.     Left upper body: No supraclavicular or axillary adenopathy.     Lower Body: No right inguinal adenopathy. No left inguinal adenopathy.  Skin:    General: Skin is warm and dry.     Findings: No bruising, erythema, lesion or rash.  Neurological:     Mental Status: She is alert and oriented to person, place, and time.     Motor: No weakness.  Psychiatric:        Mood and Affect: Mood and affect normal.        Behavior: Behavior normal.        Thought Content: Thought content normal.        Judgment: Judgment normal.    Imaging studies: 04/06/2018:PET scanrevealed enlarged and hypermetabolic supraclavicular, mediastinal, abdominal and pelvic adenopathy. Additional smaller hypermetabolic lymph nodes identified within the left cervical lymph node chain. Largest lymph node was a 5.7 cm subcarinal lymph node (SUV 26.78). 07/03/2018:Chest, abdomen, and pelvic CTrevealed marked interval response to therapy. Previously identified relatively bulky lymphadenopathy in  the chest, abdomen, and pelvis had resolved with no pathologically enlarged lymphadenopathy. There was no new or progressive interval findings. 09/25/2018:PET scanrevealed partial response to chemotherapy. The bulky intensely hypermetabolic mediastinal adenopathy was near completely resolved; however a solitary 1.3 cm node remained with intense radiotracer activity (Deauville 4). There was complete metabolic response and size reduction of cervical lymph nodes, retroperitoneal periaortic nodes and pelvic nodes (Deauville 1). 01/09/2019:ChestCTwithout contrastrevealed severe bilateral paramediastinal radiation fibrosis changes.There was underlying emphysematous changes and pulmonary scarring. There were no findings suspicious for pulmonary metastatic disease. There was interval decrease in size of the precarinal lymph node when compared to prior study. There was stable advanced atherosclerotic calcifications involving the thoracic and upper abdominal aorta and branch vessels including dense three-vessel coronary artery calcifications. 03/20/2019:  PET scan revealed further improvement in hypermetabolic activity associated with a small residual pre-carinal lymph node which had decreased in size compared with the prior PET-CT of 6 months ago (still Deauville 4).  There was no other residual or recurrent hypermetabolic nodal activity in the neck, chest, abdomen or pelvis (Deauville 1). There was resolving radiation changes in both lungs. There was stable extensive atherosclerosis and moderate centrilobular emphysema.  10/01/2019:  Chest, abdomen, and pelvis CT revealed no active lymphoma.  The index precarinal lymph node measured 0.6 cm (0.1 cm smaller than previously).   She has coronary atherosclerosis with prior CABG. there was prominent main pulmonary artery felt secondary to pulmonary artery hypertension.  There was esophageal dysmotility or reflux.  She had stents in the lower abdominal aortic and  right common iliac artery aneurysms.  There was nonobstructive right nephrolithiasis.  There was a stable rim calcified lesion in the left ovary.   Infusion on 02/07/2020  Component Date Value Ref Range Status  . Sodium 02/07/2020 138  135 - 145 mmol/L Final  . Potassium 02/07/2020 3.6  3.5 - 5.1 mmol/L Final  . Chloride 02/07/2020 107  98 - 111 mmol/L Final  . CO2 02/07/2020 22  22 - 32 mmol/L Final  . Glucose, Bld 02/07/2020 91  70 - 99 mg/dL Final   Glucose reference range applies only to samples taken after fasting for at least 8 hours.  . BUN 02/07/2020 20  8 - 23 mg/dL Final  . Creatinine, Ser 02/07/2020 1.04* 0.44 - 1.00 mg/dL Final  . Calcium 02/07/2020 9.0  8.9 - 10.3 mg/dL Final  . Total Protein 02/07/2020 6.3* 6.5 - 8.1 g/dL Final  . Albumin 02/07/2020 4.0  3.5 - 5.0 g/dL Final  . AST 02/07/2020 22  15 - 41 U/L Final  . ALT 02/07/2020 13  0 - 44 U/L Final  . Alkaline Phosphatase 02/07/2020 58  38 - 126 U/L Final  . Total Bilirubin 02/07/2020 0.5  0.3 - 1.2 mg/dL Final  . GFR calc non Af Amer 02/07/2020 54* >60 mL/min Final  . GFR calc Af Amer 02/07/2020 >60  >60 mL/min Final  . Anion gap 02/07/2020 9  5 - 15 Final   Performed at [facility], [address], [place], [place] [address]  . WBC 02/07/2020 6.8  4.0 - 10.5 K/uL Final  . RBC 02/07/2020 3.70* 3.87 - 5.11 MIL/uL Final  . Hemoglobin 02/07/2020 11.9* 12.0 - 15.0 g/dL Final  . HCT 02/07/2020 37.0  36 - 46 % Final  . MCV 02/07/2020 100.0  80.0 - 100.0 fL Final  . MCH 02/07/2020 32.2  26.0 - 34.0 pg Final  . MCHC 02/07/2020 32.2  30.0 - 36.0 g/dL Final  . RDW 02/07/2020 13.1  11.5 - 15.5 % Final  . Platelets 02/07/2020 181  150 - 400 K/uL Final  . nRBC 02/07/2020 0.0  0.0 - 0.2 % Final  . Neutrophils Relative % 02/07/2020 67  % Final  . Neutro Abs 02/07/2020 4.5  1.7 - 7.7 K/uL Final  . Lymphocytes Relative 02/07/2020 20  % Final  . Lymphs Abs 02/07/2020 1.4  0.7 - 4.0 K/uL Final  . Monocytes Relative  02/07/2020 10  % Final  . Monocytes Absolute 02/07/2020 0.7  0 - 1 K/uL Final  . Eosinophils Relative 02/07/2020 3  % Final  . Eosinophils Absolute 02/07/2020 0.2  0 - 0 K/uL Final  . Basophils Relative 02/07/2020 0  % Final  . Basophils Absolute 02/07/2020 0.0  0 - 0 K/uL Final  . Immature Granulocytes 02/07/2020 0  % Final  . Abs Immature Granulocytes 02/07/2020 0.02  0.00 - 0.07 K/uL Final   Performed at [facility], [address] [place], [place] [address]  . Uric Acid, Serum 02/07/2020 5.9  2.5 - 7.1 mg/dL Final   Performed at [facility], [address] [place], [place] [address]  . LDH 02/07/2020 213* 98 - 192 U/L Final   Performed at [facility], [address] [place], [place] [address]    Assessment:  [name] is a 74 y.o. female with stage IVBdiffuse large B cell lymphomainvolving the gastric fundus and duodenum. IPI is 3 (intermediate-high risk), age-adjusted IPI is 2 (low-intermediate risk), revised IPI is 3 (poor), and NCCN-IPI is 5 (intermediate-high risk). CNS risk is 3 (intermediate: age, LDH, stage).  EGDon 03/15/2018 revealed pathology from the duodenal scalloped lesion and duodenal sweep ulcerwith involvement with diffuse large B cell lymphoma. Random gastric biopsy was negative for lymphoma. There was mild chronic gastritis  with reactive foveolar hyperplasia. Gastric fundus scarrevealed involvement with diffuse large B cell lymphoma. GE junction biopsy revealed no lymphoma with squamocolumnar mucosa with mild chronic active inflammation.  The lymphoma has a non-germinal center immunophenotype with a Ki-67 proliferation index of 60% and shows Bcl-2/CMYC co-expression by immunohistochemical analysis. Summary of IHC results, neoplastic cells: Positive: PAX-5, CD20(variable), Bcl-2, MUM1, Bcl-6 (variable), CMYC. Negative: CD10, CD5, CD30, CD138, Cyclin-D1, ALK-1, CD3. EBV (ISH): Negative. FISH for MYCgene  rearrangement (8q24 gene region) was negative.  PET scanon 04/06/2018 revealed enlarged and hypermetabolic supraclavicular, mediastinal, abdominal and pelvic adenopathy. Additional smaller hypermetabolic lymph nodes identified within the left cervical lymph node chain. Largest lymph node was a 5.7 cm subcarinal lymph node (SUV 26.78).  Bone marrow aspirate and biopsyon 04/12/2018 revealed a slightly hypercellular bone marrow for age with trilineage hematopoiesis. There were dyspoietic changes associated with the granulocyte series. Flow cytometry revealed a predominance of T lymphocytes with relative abundance of CD8 + cells. There was no significant B cell population. Cytogenetics are pending.  Echorevealed an EF of 55-60% on 04/04/2018, 50-55% on 07/19/2018, and 45-50% on 04/03/2019.  She presented with a normocytic anemia. She was admitted x 2 (12/2017 and 01/2018) with rectal bleeding. She is intolerant of oral iron. Dietis modest.  EGDon 12/30/2017 revealed non bleeding gastric erosionsin the antrum. Colonoscopyon 12/30/2017 revealed numerous polyps(one 5 mm in ascending colon, two 7 mm polyps in transverse colon, five 5-7 mm polyps in the descending colon, one 7 mm polyp in the sigmoid colon, one 8 mm polyp in the recto-sigmoid colon, and one 19 mm polyp in the rectum). 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   She developed hematochezia post colonoscopy. Flexible sigmoidoscopyon 12/31/2017 revealed a rectal ulcer at the site of the polypectomy and blood in the rectum. Clips were placed. EGDon 02/05/2018 revealed 2 bleeding AVMs (treated with APC) in the small bowel with scalloped/denuded mucosa underneath the bleeding sites.  Work-up on 09/16/2019revealed a hematocrit of 32.5, hemoglobin 10.7, MCV 91.7, platelets 207,000, WBC 1800 with an ANC of 600. Uric acid was 8.8. LDH was 241 (98-192) and beta2-microglobulin 5.8  (0.6-2.4). Negative studiesincluded: hepatitis B core antibody total, hepatitis B surface antigen, hepatitis C antibody, HIV testing.   She has required 4 units of PRBCs(3 units in 12/2017 and 1 unit in 01/2018).  Ferritinhas been followed: 30 on 12/29/2017, 93 on 02/13/2018, 57 on 03/07/2018, 38 on 04/03/2018, 93 on 03/22/2019, and 123 on 06/20/2019.  Labs on 12/29/2017 revealed the following: B12was 254 (low normal). She has been on oral B12since 03/07/2018. B12 was 1524 on 04/03/2018. Folate6.3 (>5.9) on 12/29/2017 and 55.8 on 04/03/2018.   Shereceived6cycles of mini-RCHOP(04/24/2018 - 09/10/2018) withgrowth factor support ([place] On-Pro Neulasta). Cycle #1 platelet nadir was 82,000. Cycle #4 was postponed on 07/13/2018 (ANC 0).   PET scanon 09/25/2018 revealed partial response to chemotherapy. The bulky intensely hypermetabolic mediastinal adenopathy was near completely resolved; however a solitary 1.3 cm node remained with intense radiotracer activity (Deauville 4). There was complete metabolic response and size reduction of cervical lymph nodes, retroperitoneal periaortic nodes and pelvic nodes (Deauville 1).  She receivedradiation therapywith 3000 cGy to the mediastinum and 3600 cGy to the hypermetabolic lymph node from 11/01/2018 - 12/04/2018.  ChestCTwithout contraston 01/09/2019 revealed severe bilateral paramediastinal radiation fibrosis changes.There was underlying emphysematous changes and pulmonary scarring. There were no findings suspicious for pulmonary metastatic disease. There was interval decrease in size of the precarinal lymph node when compared to prior study. There was  stable advanced atherosclerotic calcifications involving the thoracic and upper abdominal aorta and branch vessels including dense three-vessel coronary artery calcifications.  PET scan on 03/20/2019 revealed further improvement in hypermetabolic activity associated with  a small residual pre-carinal lymph node which had decreased in size compared with the prior PET-CT of 6 months ago (still Deauville 4).  There was no other residual or recurrent hypermetabolic nodal activity in the neck, chest, abdomen or pelvis (Deauville 1). There was resolving radiation changes in both lungs. There was stable extensive atherosclerosis and moderate centrilobular emphysema.   Chest, abdomen, and pelvis CT on 10/01/2019 revealed no active lymphoma.  The index precarinal lymph node measured 0.6 cm (0.1 cm smaller than previously).   She has chronicrenal insufficiency. Creatinine was 1.42 (CrCl 25 ml/min) on 04/03/2018.  She haslupus(diagnosed 1982). She is on prednisone 5 mg a day and Plaquenil.  Symptomatically, she is doing well.  She denies any fevers, sweats or weight loss.  Exam is stable without adenopathy or hepatosplenomegaly.  Plan: 1.   Labs today:  CBC with diff, CMP, LDH, uric acid. 2.Stage IVB diffuse large B cell lymphoma Clinically, she continues to do well. Exam reveals no adenopathy or hepatosplenomegaly. Shereceived 6 cycles of mini R-CHOP (last 08/31/2018). She had a partial response to treatment. She underwent involved field radiation to the mediastinum (completed05/18/2020). PET on09/07/2018 revealed improvement in the mediastinal lymph node hypermetabolic activity.            She had no evidence of recurrent disease in the neck chest abdomen or pelvis. Chest, abdomen and pelvic CT scan on 10/01/2019 revealed no evidence of recurrent lymphoma. Continue surveillance. If active disease, plan Rituxan and Revlimid. 3.   Declining ejection fraction  Echo on 04/04/2018:  EF was 55-60%  Echo on 07/19/2018:  EF was 50-55%  Echo on 04/03/2019:  EF was 45-50%.  She denies any symptoms of CHF.  Referral back to Dr. Rockey Situ (cardiology) for follow-up. 4.   Elevated uric acid  Uric acid 5.9 today.  Patient on allopurinol 50 mg a day.  Continue to  monitor. 5.   GI issues  Reflux symptoms have improved on Protonix.  Review last note by Dr. Bonna Gains on 12/05/2019.  Discussion held regarding follow-up colonoscopy (unknown date).  Patient has a history of multiple colonic polyps noted on her prior colonoscopy in 12/2017.   Pathology confirmed multiple adenomas.  Send note to Dr. Chuck Hint. 6.   Port removal with Dr Lucky Cowboy. 7.   Referral cardiology (Dr Rockey Situ). 8.   RTC in 4 months for MD assessment and labs (CBC with diff, CMP, LDH, uric acid).  I discussed the assessment and treatment plan with the patient.  The patient was provided an opportunity to ask questions and all were answered.  The patient agreed with the plan and demonstrated an understanding of the instructions.  The patient was advised to call back if the symptoms worsen or if the condition fails to improve as anticipated.   Lequita Asal, MD, PhD    02/07/2020,3:05 PM   I, [employer], am acting as a Education administrator for [employer]. Mike Gip, MD.   I, Melissa C. Mike Gip, MD, have reviewed the above documentation for accuracy and completeness, and I agree with the above.

## 2020-02-06 NOTE — H&P (View-Only) (Signed)
[facility]  [address] [place], [place] [address] Phone: [phone]  Fax: [phone]  Office Visit:  02/07/2020   Referring physician: [name], MD  Chief Complaint: [name] Torres is a 74 y.o. 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  HPI: [place] Torres patient was last seen [name] medical oncology clinic on 10/03/2019. At that time, she was doing well.  She noted some reflux.  Exam revealed no adenopathy or hepatosplenomegaly.  Labs revealed a hematocrit 37.4, hemoglobin 12.0, MCV 97.9, platelets 198,000, WBC 7300, ANC 5300.  Uric acid was 7.6.  CT scans revealed no evidence of recurrent disease.  We discussed ongoing surveillance.  She underwent cataract extraction and intraocular lens placement on 11/15/2019 (right) and 12/20/2019 (left).   Patient seen by Dr. Bonna Gains on 12/05/2019.  Patient noted to be due for surveillance colonoscopy.  Education provided on reflux.  She was to take Nexium daily.   She saw Dr Baruch Gouty on 02/01/2020.  She was doing well.  Follow-up is scheduled in 1 year.  During Judy Torres interim, she has been well. She notes that her heartburn has resolved with diet alone. She has not followed up with a cardiologist; she did have one quite some time ago but was lost to follow up. She does follow up with her rheumatologist, Dr. Jefm Bryant.   She is fully vaccinated against COVID-19. She denies any difficulties with Judy Torres vaccines.    Past Medical History:  Diagnosis Date  . Cancer [place] Torres)    Stomach cancer  . Chronic kidney disease   . GERD (gastroesophageal reflux disease)   . HTN (hypertension)   . Hyperlipidemia   . Lupus ([place])   . S/P angioplasty with stent   . Wears dentures    full upper and lower    Past Surgical History:  Procedure Laterality Date  . CARDIAC CATHETERIZATION     stent  . CARDIAC SURGERY    . CATARACT EXTRACTION W/PHACO Right 11/15/2019   Procedure:  CATARACT EXTRACTION PHACO AND INTRAOCULAR LENS PLACEMENT (IOC) RIGHT  VISION BLUE 7.48 00:45.8;  Surgeon: Marchia Meiers, MD;  Location: [facility];  Service: Ophthalmology;  Laterality: Right;  Port a cath  . CATARACT EXTRACTION W/PHACO Left 12/20/2019   Procedure: CATARACT EXTRACTION PHACO AND INTRAOCULAR LENS PLACEMENT ([place]) LEFT VISION BLUE;  Surgeon: Marchia Meiers, MD;  Location: [facility];  Service: Ophthalmology;  Laterality: Left;  6.17 0:36.1  . CESAREAN SECTION    . COLONOSCOPY N/A 12/30/2017   Procedure: COLONOSCOPY;  Surgeon: Lin Landsman, MD;  Location: [facility] ENDOSCOPY;  Service: Gastroenterology;  Laterality: N/A;  . CORONARY ARTERY BYPASS GRAFT  2003  . ESOPHAGOGASTRODUODENOSCOPY N/A 12/30/2017   Procedure: ESOPHAGOGASTRODUODENOSCOPY (EGD);  Surgeon: Lin Landsman, MD;  Location: [facility]COPY;  Service: Gastroenterology;  Laterality: N/A;  . ESOPHAGOGASTRODUODENOSCOPY Left 02/05/2018   Procedure: ESOPHAGOGASTRODUODENOSCOPY (EGD);  Surgeon: Virgel Manifold, MD;  Location: [facility] ENDOSCOPY;  Service: Endoscopy;  Laterality: Left;  . ESOPHAGOGASTRODUODENOSCOPY (EGD) WITH PROPOFOL N/A 03/15/2018   Procedure: ESOPHAGOGASTRODUODENOSCOPY (EGD) WITH PROPOFOL;  Surgeon: Virgel Manifold, MD;  Location: [facility] ENDOSCOPY;  Service: Endoscopy;  Laterality: N/A;  . FLEXIBLE SIGMOIDOSCOPY N/A 12/31/2017   Procedure: FLEXIBLE SIGMOIDOSCOPY;  Surgeon: Jonathon Bellows, MD;  Location: [facility] ENDOSCOPY;  Service: Gastroenterology;  Laterality: N/A;  . PORTA CATH INSERTION N/A 04/19/2018   Procedure: PORTA CATH INSERTION;  Surgeon: Algernon Huxley, MD;  Location: [place] INVASIVE CV  LAB;  Service: Cardiovascular;  Laterality: N/A;    Family History  Problem Relation Age of Onset  . Hypertension Mother   . Stroke Mother   . Vascular Disease Mother   . Hypertension Sister     Social History:  reports that she quit smoking about 17 years ago. Her smoking use included cigarettes.  She has a 10.00 pack-year smoking history. She has never used smokeless tobacco. She reports previous alcohol use. She reports that she does not use drugs. She has smoked 1/2 pack/day x 20 years. She stopped smoking in 2000. She denies any exposure to radiation or toxins. She is from [place]. [place]. She moved to [place] years ago after marrying her husband. She was [name] [employer]. Her husband [phone]). Judy Torres patient is alone today.    Allergies:  Allergies  Allergen Reactions  . Penicillins Other (See Comments)    Pass out Has patient had a PCN reaction causing immediate rash, facial/tongue/throat swelling, SOB or lightheadedness with hypotension: Yes Has patient had a PCN reaction causing severe rash involving mucus membranes or skin necrosis: No Has patient had a PCN reaction that required hospitalization: No Has patient had a PCN reaction occurring within Judy Torres last 10 years: No If all of Judy [place] above answers are "NO", then may proceed with Cephalosporin use.   . Sulfa Antibiotics Nausea Only  . Sulfasalazine Nausea And Vomiting       . Ace Inhibitors Nausea Only  . Losartan Anxiety  . Spironolactone Other (See Comments)    Felt poorly but couldn't clarify     Current Medications: Current Outpatient Medications  Medication Sig Dispense Refill  . acetaminophen (TYLENOL) 500 MG tablet Take 1,000 mg by mouth 2 (two) times daily as needed for moderate pain or headache.    . alendronate (FOSAMAX) 70 MG tablet Take 70 mg by mouth every Saturday.     Marland Kitchen allopurinol (ZYLOPRIM) 100 MG tablet Take 0.5 tablets (50 mg total) by mouth daily. 45 tablet 1  . amLODipine (NORVASC) 10 MG tablet Take 10 mg by mouth daily.    . Calcium Carb-Cholecalciferol (CALCIUM 600-D PO) Take 600 mg by mouth daily.    . clotrimazole-betamethasone (LOTRISONE) cream Apply 1 application topically 2 (two) times daily as needed (rash).     Marland Kitchen esomeprazole (NEXIUM) 20 MG packet Take 20 mg by mouth daily  before breakfast.    . ferrous sulfate 325 (65 FE) MG tablet Take 1 tablet (325 mg total) by mouth 2 (two) times daily with a meal. 60 tablet 3  . fluticasone (FLONASE) 50 MCG/ACT nasal spray Place 1 spray into both nostrils daily as needed for allergies or rhinitis.    . furosemide (LASIX) 40 MG tablet Take 40 mg by mouth daily.    . hydroxychloroquine (PLAQUENIL) 200 MG tablet Take 200 mg by mouth 2 (two) times daily.     Marland Kitchen lidocaine-prilocaine (EMLA) cream Apply to affected area once 30 g 3  . Multiple Vitamin (MULTIVITAMIN WITH MINERALS) TABS tablet Take 1 tablet by mouth daily. 180 tablet 0  . ondansetron (ZOFRAN) 4 MG tablet Take 1 tablet (4 mg total) by mouth 2 (two) times daily as needed for refractory nausea / vomiting. Start on day 3 after cyclophosphamide chemotherapy. 30 tablet 3  . polyethylene glycol (MIRALAX / GLYCOLAX) packet Take 17 g by mouth daily.    . predniSONE (DELTASONE) 5 MG tablet Take 5 mg by mouth daily with breakfast.     . Pseudoephedrine-APAP  30-325 MG TABS Take 1 tablet by mouth daily as needed (sinuses).     . simvastatin (ZOCOR) 40 MG tablet Take 40 mg by mouth daily.     . sucralfate (CARAFATE) 1 g tablet TAKE 1 TABLET BY MOUTH 3 (THREE) TIMES DAILY. DISSOLVE IN 3-4 TBSP WARM WATER, SWISH AND SWALLOW 270 tablet 2  . vitamin B-12 (CYANOCOBALAMIN) 500 MCG tablet Take 500 mcg by mouth daily.     No current facility-administered medications for this visit.    Review of Systems  Constitutional: Negative.  Negative for chills, diaphoresis, fever, malaise/fatigue and weight loss (stable).       Feels "pretty good".  HENT: Negative.  Negative for congestion, ear pain, nosebleeds, sinus pain and sore throat.   Eyes: Negative.  Negative for blurred vision, double vision and photophobia.       Distant vision improved s/p cataract removal, but she still has trouble seeing close.  Respiratory: Positive for cough (seasonal). Negative for hemoptysis, sputum production and  shortness of breath.   Cardiovascular: Positive for leg swelling (chronic BILATERAL ankles). Negative for chest pain, palpitations, orthopnea and PND.       Serial echoes revealed declining EF.  Gastrointestinal: Negative for abdominal pain, blood in stool, constipation, diarrhea, heartburn (improved), melena, nausea and vomiting.       Eating well.  H/o multiple tubular adenomas on colonoscopy 12/2017.  Genitourinary: Negative.  Negative for dysuria, frequency, hematuria and urgency.  Musculoskeletal: Negative.  Negative for back pain, falls, joint pain, myalgias and neck pain.  Skin: Negative.  Negative for itching and rash.  Neurological: Negative.  Negative for dizziness, tingling, tremors, sensory change, speech change, focal weakness, weakness and headaches.  Endo/Heme/Allergies: Negative.  Does not bruise/bleed easily.  Psychiatric/Behavioral: Negative.  Negative for depression and memory loss. [place] patient is not nervous/anxious and does not have insomnia.   All other systems reviewed and are negative.  Performance status (ECOG): 0 - Asymptomatic   Blood pressure (!) 152/78, pulse 88, temperature (!) 97.1 F (36.2 C), temperature source Tympanic, resp. rate 18, height '4\' 11"'$  (1.499 m), weight 123 lb 10.9 oz (56.1 kg), SpO2 100 %.   Physical Exam Vitals reviewed.  Constitutional:      General: She is not in acute distress.    Appearance: Normal appearance. She is well-developed. She is not diaphoretic.     Interventions: Face mask in place.  HENT:     Head: Normocephalic and atraumatic.     Comments: Curly black hair.    Mouth/Throat:     Mouth: Mucous membranes are moist. No oral lesions.  Eyes:     Conjunctiva/sclera: Conjunctivae normal.     Pupils: Pupils are equal, round, and reactive to light.     Comments: Glasses.  Brown eyes.  Neck:     Vascular: No carotid bruit.  Cardiovascular:     Rate and Rhythm: Normal rate and regular rhythm.     Pulses: Normal pulses.      Heart sounds: Normal heart sounds. No murmur heard.  No friction rub. No gallop.   Pulmonary:     Effort: Pulmonary effort is normal.     Breath sounds: Normal breath sounds. No wheezing, rhonchi or rales.  Abdominal:     General: Bowel sounds are normal.     Palpations: Abdomen is soft. There is no hepatomegaly, splenomegaly or mass.     Tenderness: There is no abdominal tenderness.     Hernia: No hernia is present.  Musculoskeletal:  General: No tenderness. Normal range of motion.     Cervical back: Normal range of motion and neck supple.     Right lower leg: No edema.     Left lower leg: No edema.  Lymphadenopathy:     Head:     Right side of head: No preauricular, posterior auricular or occipital adenopathy.     Left side of head: No preauricular, posterior auricular or occipital adenopathy.     Cervical: No cervical adenopathy.     Upper Body:     Right upper body: No supraclavicular or axillary adenopathy.     Left upper body: No supraclavicular or axillary adenopathy.     Lower Body: No right inguinal adenopathy. No left inguinal adenopathy.  Skin:    General: Skin is warm and dry.     Findings: No bruising, erythema, lesion or rash.  Neurological:     Mental Status: She is alert and oriented to person, place, and time.     Motor: No weakness.  Psychiatric:        Mood and Affect: Mood and affect normal.        Behavior: Behavior normal.        Thought Content: Thought content normal.        Judgment: Judgment normal.    Imaging studies: 04/06/2018:PET scanrevealed enlarged and hypermetabolic supraclavicular, mediastinal, abdominal and pelvic adenopathy. Additional smaller hypermetabolic lymph nodes identified within [place] Torres left cervical lymph node chain. Largest lymph node was a 5.7 cm subcarinal lymph node (SUV 26.78). 07/03/2018:Chest, abdomen, and pelvic CTrevealed marked interval response to therapy. Previously identified relatively bulky lymphadenopathy in  Jud[place] Torres chest, abdomen, and pelvis had resolved with no pathologically enlarged lymphadenopathy. There was no new or progressive interval findings. 09/25/2018:PET scanrevealed partial response to chemotherapy. [place] bulky intensely hypermetabolic mediastinal adenopathy was near completely resolved; however a solitary 1.3 cm node remained with intense radiotracer activity (Deauville 4). There was complete metabolic response and size reduction of cervical lymph nodes, retroperitoneal periaortic nodes and pelvic nodes (Deauville 1). 01/09/2019:ChestCTwithout contrastrevealed severe bilateral paramediastinal radiation fibrosis changes.There was underlying emphysematous changes and pulmonary scarring. There were no findings suspicious for pulmonary metastatic disease. There was interval decrease in size of Judy Torres precarinal lymph node when compared to prior study. There was stable advanced atherosclerotic calcifications involving Judy Torres thoracic and upper abdominal aorta and branch vessels including dense three-vessel coronary artery calcifications. 03/20/2019:  PET scan revealed further improvement in hypermetabolic activity associated with a small residual pre-carinal lymph node which had decreased in size compared with Judy Torres prior PET-CT of 6 months ago (still Deauville 4).  There was no other residual or recurrent hypermetabolic nodal activity [name] neck, chest, abdomen or pelvis (Deauville 1). There was resolving radiation changes in both lungs. There was stable extensive atherosclerosis and moderate centrilobular emphysema.  10/01/2019:  Chest, abdomen, and pelvis CT revealed no active lymphoma.  [place] Torres index precarinal lymph node measured 0.6 cm (0.1 cm smaller than previously).   She has coronary atherosclerosis with prior CABG. there was prominent main pulmonary artery felt secondary to pulmonary artery hypertension.  There was esophageal dysmotility or reflux.  She had stents [name] lower abdominal aortic and  right common iliac artery aneurysms.  There was nonobstructive right nephrolithiasis.  There was a stable rim calcified lesion [name] left ovary.   Infusion on 02/07/2020  Component Date Value Ref Range Status  . Sodium 02/07/2020 138  135 - 145 mmol/L Final  . Potassium 02/07/2020 3.6  3.5 - 5.1 mmol/L Final  . Chloride 02/07/2020 107  98 - 111 mmol/L Final  . CO2 02/07/2020 22  22 - 32 mmol/L Final  . Glucose, Bld 02/07/2020 91  70 - 99 mg/dL Final   Glucose reference range applies only to samples taken after fasting for at least 8 hours.  . BUN 02/07/2020 20  8 - 23 mg/dL Final  . Creatinine, Ser 02/07/2020 1.04* 0.44 - 1.00 mg/dL Final  . Calcium 02/07/2020 9.0  8.9 - 10.3 mg/dL Final  . Total Protein 02/07/2020 6.3* 6.5 - 8.1 g/dL Final  . Albumin 02/07/2020 4.0  3.5 - 5.0 g/dL Final  . AST 02/07/2020 22  15 - 41 U/L Final  . ALT 02/07/2020 13  0 - 44 U/L Final  . Alkaline Phosphatase 02/07/2020 58  38 - 126 U/L Final  . Total Bilirubin 02/07/2020 0.5  0.3 - 1.2 mg/dL Final  . GFR calc non Af Amer 02/07/2020 54* >60 mL/min Final  . GFR calc Af Amer 02/07/2020 >60  >60 mL/min Final  . Anion gap 02/07/2020 9  5 - 15 Final   Performed at [facility], [address], [place], [place] [address]  . WBC 02/07/2020 6.8  4.0 - 10.5 K/uL Final  . RBC 02/07/2020 3.70* 3.87 - 5.11 MIL/uL Final  . Hemoglobin 02/07/2020 11.9* 12.0 - 15.0 g/dL Final  . HCT 02/07/2020 37.0  36 - 46 % Final  . MCV 02/07/2020 100.0  80.0 - 100.0 fL Final  . MCH 02/07/2020 32.2  26.0 - 34.0 pg Final  . MCHC 02/07/2020 32.2  30.0 - 36.0 g/dL Final  . RDW 02/07/2020 13.1  11.5 - 15.5 % Final  . Platelets 02/07/2020 181  150 - 400 K/uL Final  . nRBC 02/07/2020 0.0  0.0 - 0.2 % Final  . Neutrophils Relative % 02/07/2020 67  % Final  . Neutro Abs 02/07/2020 4.5  1.7 - 7.7 K/uL Final  . Lymphocytes Relative 02/07/2020 20  % Final  . Lymphs Abs 02/07/2020 1.4  0.7 - 4.0 K/uL Final  . Monocytes Relative  02/07/2020 10  % Final  . Monocytes Absolute 02/07/2020 0.7  0 - 1 K/uL Final  . Eosinophils Relative 02/07/2020 3  % Final  . Eosinophils Absolute 02/07/2020 0.2  0 - 0 K/uL Final  . Basophils Relative 02/07/2020 0  % Final  . Basophils Absolute 02/07/2020 0.0  0 - 0 K/uL Final  . Immature Granulocytes 02/07/2020 0  % Final  . Abs Immature Granulocytes 02/07/2020 0.02  0.00 - 0.07 K/uL Final   Performed at [facility], [address], [place], [place] [address]  . Uric Acid, Serum 02/07/2020 5.9  2.5 - 7.1 mg/dL Final   Performed at [facility], [address], [place], [place] [address]  . LDH 02/07/2020 213* 98 - 192 U/L Final   Performed at [facility], [address] [place], [place] [address]    Assessment:  [name] Torres is a 74 y.o. female with stage IVBdiffuse large B cell lymphomainvolving Judy Torres gastric fundus and duodenum. IPI is 3 (intermediate-high risk), age-adjusted IPI is 2 (low-intermediate risk), revised IPI is 3 (poor), and NCCN-IPI is 5 (intermediate-high risk). CNS risk is 3 (intermediate: age, LDH, stage).  EGDon 03/15/2018 revealed pathology from [place] duodenal scalloped lesion and duodenal sweep ulcerwith involvement with diffuse large B cell lymphoma. Random gastric biopsy was negative for lymphoma. There was mild chronic gastritis  with reactive foveolar hyperplasia. Gastric fundus scarrevealed involvement with diffuse large B cell lymphoma. GE junction biopsy revealed no lymphoma with squamocolumnar mucosa with mild chronic active inflammation.  [place] lymphoma has a non-germinal center immunophenotype with a Ki-67 proliferation index of 60% and shows Bcl-2/CMYC co-expression by immunohistochemical analysis. Summary of IHC results, neoplastic cells: Positive: PAX-5, CD20(variable), Bcl-2, MUM1, Bcl-6 (variable), CMYC. Negative: CD10, CD5, CD30, CD138, Cyclin-D1, ALK-1, CD3. EBV (ISH): Negative. FISH for MYCgene  rearrangement (8q24 gene region) was negative.  PET scanon 04/06/2018 revealed enlarged and hypermetabolic supraclavicular, mediastinal, abdominal and pelvic adenopathy. Additional smaller hypermetabolic lymph nodes identified within [place] left cervical lymph node chain. Largest lymph node was a 5.7 cm subcarinal lymph node (SUV 26.78).  Bone marrow aspirate and biopsyon 04/12/2018 revealed a slightly hypercellular bone marrow for age with trilineage hematopoiesis. There were dyspoietic changes associated with [place] granulocyte series. Flow cytometry revealed a predominance of T lymphocytes with relative abundance of CD8 + cells. There was no significant B cell population. Cytogenetics are pending.  Echorevealed an EF of 55-60% on 04/04/2018, 50-55% on 07/19/2018, and 45-50% on 04/03/2019.  She presented with a normocytic anemia. She was admitted x 2 (12/2017 and 01/2018) with rectal bleeding. She is intolerant of oral iron. Dietis modest.  EGDon 12/30/2017 revealed non bleeding gastric erosionsin Judy Torres antrum. Colonoscopyon 12/30/2017 revealed numerous polyps(one 5 mm in ascending colon, two 7 mm polyps in transverse colon, five 5-7 mm polyps [name] descending colon, one 7 mm polyp [name] sigmoid colon, one 8 mm polyp [name] recto-sigmoid colon, and one 19 mm polyp [name] rectum). 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   She developed hematochezia post colonoscopy. Flexible sigmoidoscopyon 12/31/2017 revealed a rectal ulcer at [place] site of [place] polypectomy and blood [name] rectum. Clips were placed. EGDon 02/05/2018 revealed 2 bleeding AVMs (treated with APC) [name] small bowel with scalloped/denuded mucosa underneath [place] bleeding sites.  Work-up on 09/16/2019revealed a hematocrit of 32.5, hemoglobin 10.7, MCV 91.7, platelets 207,000, WBC 1800 with an ANC of 600. Uric acid was 8.8. LDH was 241 (98-192) and beta2-microglobulin 5.8  (0.6-2.4). Negative studiesincluded: hepatitis B core antibody total, hepatitis B surface antigen, hepatitis C antibody, HIV testing.   She has required 4 units of PRBCs(3 units in 12/2017 and 1 unit in 01/2018).  Ferritinhas been followed: 30 on 12/29/2017, 93 on 02/13/2018, 57 on 03/07/2018, 38 on 04/03/2018, 93 on 03/22/2019, and 123 on 06/20/2019.  Labs on 12/29/2017 revealed [place] following: B12was 254 (low normal). She has been on oral B12since 03/07/2018. B12 was 1524 on 04/03/2018. Folate6.3 (>5.9) on 12/29/2017 and 55.8 on 04/03/2018.   Shereceived6cycles of mini-RCHOP(04/24/2018 - 09/10/2018) withgrowth factor support ([place] On-Pro Neulasta). Cycle #1 platelet nadir was 82,000. Cycle #4 was postponed on 07/13/2018 ([place] 0).   PET scanon 09/25/2018 revealed partial response to chemotherapy. [place] Torres bulky intensely hypermetabolic mediastinal adenopathy was near completely resolved; however a solitary 1.3 cm node remained with intense radiotracer activity (Deauville 4). There was complete metabolic response and size reduction of cervical lymph nodes, retroperitoneal periaortic nodes and pelvic nodes (Deauville 1).  She receivedradiation therapywith 3000 cGy to [place] mediastinum and 3600 cGy to Judy Torres hypermetabolic lymph node from 64/40/3474 - 12/04/2018.  ChestCTwithout contraston 01/09/2019 revealed severe bilateral paramediastinal radiation fibrosis changes.There was underlying emphysematous changes and pulmonary scarring. There were no findings suspicious for pulmonary metastatic disease. There was interval decrease in size of [place] Torres precarinal lymph node when compared to prior study. There was  stable advanced atherosclerotic calcifications involving [place] thoracic and upper abdominal aorta and branch vessels including dense three-vessel coronary artery calcifications.  PET scan on 03/20/2019 revealed further improvement in hypermetabolic activity associated with  a small residual pre-carinal lymph node which had decreased in size compared with [place] prior PET-CT of 6 months ago (still Deauville 4).  There was no other residual or recurrent hypermetabolic nodal activity [name] neck, chest, abdomen or pelvis (Deauville 1). There was resolving radiation changes in both lungs. There was stable extensive atherosclerosis and moderate centrilobular emphysema.   Chest, abdomen, and pelvis CT on 10/01/2019 revealed no active lymphoma.  [place] index precarinal lymph node measured 0.6 cm (0.1 cm smaller than previously).   She has chronicrenal insufficiency. Creatinine was 1.42 (CrCl 25 ml/min) on 04/03/2018.  She haslupus(diagnosed 1982). She is on prednisone 5 mg a day and Plaquenil.  Symptomatically, she is doing well.  She denies any fevers, sweats or weight loss.  Exam is stable without adenopathy or hepatosplenomegaly.  Plan: 1.   Labs today:  CBC with diff, CMP, LDH, uric acid. 2.Stage IVB diffuse large B cell lymphoma Clinically, she continues to do well. Exam reveals no adenopathy or hepatosplenomegaly. Shereceived 6 cycles of mini R-CHOP (last 08/31/2018). She had a partial response to treatment. She underwent involved field radiation to [place] mediastinum (completed05/18/2020). PET on09/07/2018 revealed improvement [name] mediastinal lymph node hypermetabolic activity.            She had no evidence of recurrent disease [name] neck chest abdomen or pelvis. Chest, abdomen and pelvic CT scan on 10/01/2019 revealed no evidence of recurrent lymphoma. Continue surveillance. If active disease, plan Rituxan and Revlimid. 3.   Declining ejection fraction  Echo on 04/04/2018:  EF was 55-60%  Echo on 07/19/2018:  EF was 50-55%  Echo on 04/03/2019:  EF was 45-50%.  She denies any symptoms of CHF.  Referral back to Dr. Rockey Situ (cardiology) for follow-up. 4.   Elevated uric acid  Uric acid 5.9 today.  Patient on allopurinol 50 mg a day.  Continue to  monitor. 5.   GI issues  Reflux symptoms have improved on Protonix.  Review last note by Dr. Bonna Gains on 12/05/2019.  Discussion held regarding follow-up colonoscopy (unknown date).  Patient has a history of multiple colonic polyps noted on her prior colonoscopy in 12/2017.   Pathology confirmed multiple adenomas.  Send note to Dr. Chuck Hint. 6.   Port removal with Dr Lucky Cowboy. 7.   Referral cardiology (Dr Rockey Situ). 8.   RTC in 4 months for MD assessment and labs (CBC with diff, CMP, LDH, uric acid).  I discussed Judy Torres assessment and treatment plan with Judy Torres patient.  Judy Torres patient was provided an opportunity to ask questions and all were answered.  Judy Torres patient agreed with Judy Torres plan and demonstrated an understanding of Judy Torres instructions.  Judy Torres patient was advised to call back if Judy Torres symptoms worsen or if Judy Torres condition fails to improve as anticipated.   Lequita Asal, MD, PhD    02/07/2020,3:05 PM   I, [employer], am acting as a Education administrator for [employer]. Mike Gip, MD.   I, Sarahlynn Cisnero C. Mike Gip, MD, have reviewed Judy Torres above documentation for accuracy and completeness, and I agree with Judy Torres above.

## 2020-02-07 ENCOUNTER — Encounter: Payer: Self-pay | Admitting: Hematology and Oncology

## 2020-02-07 ENCOUNTER — Inpatient Hospital Stay: Payer: Medicare HMO | Attending: Hematology and Oncology

## 2020-02-07 ENCOUNTER — Inpatient Hospital Stay (HOSPITAL_BASED_OUTPATIENT_CLINIC_OR_DEPARTMENT_OTHER): Payer: Medicare HMO | Admitting: Hematology and Oncology

## 2020-02-07 ENCOUNTER — Other Ambulatory Visit: Payer: Self-pay

## 2020-02-07 VITALS — BP 136/74 | HR 90 | Temp 96.0°F | Resp 16 | Wt 121.1 lb

## 2020-02-07 DIAGNOSIS — C8330 Diffuse large B-cell lymphoma, unspecified site: Secondary | ICD-10-CM

## 2020-02-07 DIAGNOSIS — I429 Cardiomyopathy, unspecified: Secondary | ICD-10-CM | POA: Diagnosis not present

## 2020-02-07 DIAGNOSIS — R931 Abnormal findings on diagnostic imaging of heart and coronary circulation: Secondary | ICD-10-CM

## 2020-02-07 DIAGNOSIS — Z95828 Presence of other vascular implants and grafts: Secondary | ICD-10-CM | POA: Diagnosis not present

## 2020-02-07 DIAGNOSIS — E790 Hyperuricemia without signs of inflammatory arthritis and tophaceous disease: Secondary | ICD-10-CM | POA: Insufficient documentation

## 2020-02-07 DIAGNOSIS — Z87891 Personal history of nicotine dependence: Secondary | ICD-10-CM | POA: Insufficient documentation

## 2020-02-07 LAB — COMPREHENSIVE METABOLIC PANEL
ALT: 13 U/L (ref 0–44)
AST: 22 U/L (ref 15–41)
Albumin: 4 g/dL (ref 3.5–5.0)
Alkaline Phosphatase: 58 U/L (ref 38–126)
Anion gap: 9 (ref 5–15)
BUN: 20 mg/dL (ref 8–23)
CO2: 22 mmol/L (ref 22–32)
Calcium: 9 mg/dL (ref 8.9–10.3)
Chloride: 107 mmol/L (ref 98–111)
Creatinine, Ser: 1.04 mg/dL — ABNORMAL HIGH (ref 0.44–1.00)
GFR calc Af Amer: >60 mL/min (ref >60)
GFR calc non Af Amer: 54 mL/min — ABNORMAL LOW (ref >60)
Glucose, Bld: 91 mg/dL (ref 70–99)
Potassium: 3.6 mmol/L (ref 3.5–5.1)
Sodium: 138 mmol/L (ref 135–145)
Total Bilirubin: 0.5 mg/dL (ref 0.3–1.2)
Total Protein: 6.3 g/dL — ABNORMAL LOW (ref 6.5–8.1)

## 2020-02-07 LAB — CBC WITH DIFFERENTIAL/PLATELET
Abs Immature Granulocytes: 0.02 10*3/uL (ref 0.00–0.07)
Basophils Absolute: 0 10*3/uL (ref 0.0–0.1)
Basophils Relative: 0 %
Eosinophils Absolute: 0.2 10*3/uL (ref 0.0–0.5)
Eosinophils Relative: 3 %
HCT: 37 % (ref 36.0–46.0)
Hemoglobin: 11.9 g/dL — ABNORMAL LOW (ref 12.0–15.0)
Immature Granulocytes: 0 %
Lymphocytes Relative: 20 %
Lymphs Abs: 1.4 10*3/uL (ref 0.7–4.0)
MCH: 32.2 pg (ref 26.0–34.0)
MCHC: 32.2 g/dL (ref 30.0–36.0)
MCV: 100 fL (ref 80.0–100.0)
Monocytes Absolute: 0.7 10*3/uL (ref 0.1–1.0)
Monocytes Relative: 10 %
Neutro Abs: 4.5 10*3/uL (ref 1.7–7.7)
Neutrophils Relative %: 67 %
Platelets: 181 10*3/uL (ref 150–400)
RBC: 3.7 MIL/uL — ABNORMAL LOW (ref 3.87–5.11)
RDW: 13.1 % (ref 11.5–15.5)
WBC: 6.8 10*3/uL (ref 4.0–10.5)
nRBC: 0 % (ref 0.0–0.2)

## 2020-02-07 LAB — URIC ACID: Uric Acid, Serum: 5.9 mg/dL (ref 2.5–7.1)

## 2020-02-07 LAB — LACTATE DEHYDROGENASE: LDH: 213 U/L — ABNORMAL HIGH (ref 98–192)

## 2020-02-07 MED ORDER — SODIUM CHLORIDE 0.9% FLUSH
10.0000 mL | Freq: Once | INTRAVENOUS | Status: AC
  Administered 2020-02-07: 10 mL via INTRAVENOUS
  Filled 2020-02-07: qty 10

## 2020-02-07 MED ORDER — HEPARIN SOD (PORK) LOCK FLUSH 100 UNIT/ML IV SOLN
500.0000 [IU] | Freq: Once | INTRAVENOUS | Status: AC
  Administered 2020-02-07: 500 [IU] via INTRAVENOUS
  Filled 2020-02-07: qty 5

## 2020-02-07 NOTE — Progress Notes (Signed)
Patient presents to clinic for follow-up for lymphoma. She has completed her radiation therapy. She has no medical complaints at this time.

## 2020-02-08 ENCOUNTER — Telehealth (INDEPENDENT_AMBULATORY_CARE_PROVIDER_SITE_OTHER): Payer: Self-pay

## 2020-02-08 NOTE — Telephone Encounter (Signed)
I attempted to contact the patient and a message was left for a return call. 

## 2020-02-11 NOTE — Telephone Encounter (Signed)
I called the spouse's mobile number and the daughter picked up, so per her mother telling me to speak with the daughter she has been scheduled for a port removal with Dr. Lucky Cowboy on 02/28/20 with a 6:45 am arrival time to the [facility]. Patient will do covid testing on 02/26/20 between 8-1 pm at the [facility]. Pre-procedure instructions were discussed and will be mailed.

## 2020-02-22 ENCOUNTER — Other Ambulatory Visit: Payer: Self-pay

## 2020-02-22 ENCOUNTER — Encounter: Payer: Self-pay | Admitting: Family

## 2020-02-22 ENCOUNTER — Ambulatory Visit: Payer: Medicare HMO | Admitting: Family

## 2020-02-22 VITALS — BP 134/84 | HR 89 | Ht <= 58 in | Wt 119.0 lb

## 2020-02-22 DIAGNOSIS — I10 Essential (primary) hypertension: Secondary | ICD-10-CM

## 2020-02-22 DIAGNOSIS — I25118 Atherosclerotic heart disease of native coronary artery with other forms of angina pectoris: Secondary | ICD-10-CM

## 2020-02-22 DIAGNOSIS — E785 Hyperlipidemia, unspecified: Secondary | ICD-10-CM | POA: Diagnosis not present

## 2020-02-22 DIAGNOSIS — I5020 Unspecified systolic (congestive) heart failure: Secondary | ICD-10-CM | POA: Diagnosis not present

## 2020-02-22 MED ORDER — CARVEDILOL 6.25 MG PO TABS
3.1250 mg | ORAL_TABLET | Freq: Two times a day (BID) | ORAL | 3 refills | Status: DC
Start: 2020-02-22 — End: 2020-07-25

## 2020-02-22 NOTE — Patient Instructions (Addendum)
Medication Instructions:  Your physician has recommended you make the following change in your medication: START Coreg 3.125mg  twice daily  *If you need a refill on your cardiac medications before your next appointment, please call your pharmacy*   Lab Work: No lab work today.   Testing/Procedures:  Your physician has requested that you have a lexiscan myoview. Please consider having this test done and let you know if you're agreeable.   Follow-Up: At [facility], you and your health needs are our priority.  As part of our continuing mission to provide you with exceptional heart care, we have created designated Provider Care Teams.  These Care Teams include your primary Cardiologist (physician) and Advanced Practice Providers (APPs -  Physician Assistants and Nurse Practitioners) who all work together to provide you with the care you need, when you need it.  We recommend signing up for the patient portal called "MyChart".  Sign up information is provided on this After Visit Summary.  MyChart is used to connect with patients for Virtual Visits (Telemedicine).  Patients are able to view lab/test results, encounter notes, upcoming appointments, etc.  Non-urgent messages can be sent to your provider as well.   To learn more about what you can do with MyChart, go to [email or web address].    Your next appointment:  In 3-4 weeks with Dr. Rockey Situ if possible  Other Instructions: Recommend low salt, heart healthy diet.   Recommend less than 2L of fluid per day.   Your echocardiogram in September showed a reduced heart pumping function which is a condition called heart failure. The tells [place] the heart is weaker than it used to be.

## 2020-02-22 NOTE — Progress Notes (Signed)
Office Visit    Patient Name: Judy Torres Date of Encounter: 02/22/2020  Primary Care Provider:  [name], MD Primary Cardiologist:  Ida Rogue, MD Electrophysiologist:  None   Chief Complaint    Judy Judy Torres is a 74 y.o. 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 Cancer [facility])    Stomach cancer  . Chronic kidney disease   . GERD (gastroesophageal reflux disease)   . HTN (hypertension)   . Hyperlipidemia   . Lupus ([place])   . S/P angioplasty with stent   . Wears dentures    full upper and lower   Past Surgical History:  Procedure Laterality Date  . CARDIAC CATHETERIZATION     stent  . CARDIAC SURGERY    . CATARACT EXTRACTION W/PHACO Right 11/15/2019   Procedure: CATARACT EXTRACTION PHACO AND INTRAOCULAR LENS PLACEMENT (IOC) RIGHT  VISION BLUE 7.48 00:45.8;  Surgeon: Marchia Meiers, MD;  Location: [place];  Service: Ophthalmology;  Laterality: Right;  Port a cath  . CATARACT EXTRACTION W/PHACO Left 12/20/2019   Procedure: CATARACT EXTRACTION PHACO AND INTRAOCULAR LENS PLACEMENT ([facility]) LEFT VISION BLUE;  Surgeon: Marchia Meiers, MD;  Location: [place];  Service: Ophthalmology;  Laterality: Left;  6.17 0:36.1  . CESAREAN SECTION    . COLONOSCOPY N/A 12/30/2017   Procedure: COLONOSCOPY;  Surgeon: Lin Landsman, MD;  Location: [facility] ENDOSCOPY;  Service: Gastroenterology;  Laterality: N/A;  . CORONARY ARTERY BYPASS GRAFT  2003  . ESOPHAGOGASTRODUODENOSCOPY N/A 12/30/2017   Procedure: ESOPHAGOGASTRODUODENOSCOPY (EGD);  Surgeon: Lin Landsman, MD;  [facility] ENDOSCOPY;  Service: Gastroenterology;  Laterality: N/A;  . ESOPHAGOGASTRODUODENOSCOPY Left 02/05/2018   Procedure: ESOPHAGOGASTRODUODENOSCOPY (EGD);  Surgeon: Virgel Manifold, MD;  Location: [facility] ENDOSCOPY;  Service: Endoscopy;  Laterality: Left;  . ESOPHAGOGASTRODUODENOSCOPY (EGD) WITH PROPOFOL N/A 03/15/2018   Procedure: ESOPHAGOGASTRODUODENOSCOPY (EGD) WITH PROPOFOL;  Surgeon: Virgel Manifold, MD;  Location: [facility] ENDOSCOPY;  Service: Endoscopy;  Laterality: N/A;  . FLEXIBLE SIGMOIDOSCOPY N/A 12/31/2017   Procedure: FLEXIBLE SIGMOIDOSCOPY;  Surgeon: Jonathon Bellows, MD;  Location: [facility] ENDOSCOPY;  Service: Gastroenterology;  Laterality: N/A;  . PORTA CATH INSERTION N/A 04/19/2018   Procedure: PORTA CATH INSERTION;  Surgeon: Algernon Huxley, MD;  Location: [place] CV LAB;  Service: Cardiovascular;  Laterality: N/A;    Allergies  Allergies  Allergen Reactions  . Penicillins Other (See Comments)    Pass out Has patient had a PCN reaction causing immediate rash, facial/tongue/throat swelling, SOB or lightheadedness with hypotension: Yes Has patient had a PCN reaction causing severe rash involving mucus membranes or skin necrosis: No Has patient had a PCN reaction that required hospitalization: No Has patient had a PCN reaction occurring within the last 10 years: No If all of the above answers are "NO", then may proceed with Cephalosporin use.   . Sulfa Antibiotics Nausea Only  . Sulfasalazine Nausea And Vomiting       . Ace Inhibitors Nausea Only  . Losartan Anxiety  . Spironolactone Other (See Comments)    Felt poorly but couldn't clarify     History of Present Illness    Judy Judy Torres is a 74 y.o. 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 Rockey Situ 03/2018.  Her CAD is s/p cardiac catheterization 2004. She previously followed with Dr. Nehemiah Massed. Cath 20014 with severe 2 vessel disease with total occlusions of LAD and LCx which both filled with right-left collaterals, normal RCA. She had CABG Judy 2004. Per [facility] records she had an  exercise stress test Judy 2019 which was low risk.   She had echocardiogram 03/2018 with LVEF 55 to 60%.  Repeat echo 07/2018 with LVEF 50 to 02%, grade 1 diastolic dysfunction, mild to moderate TR.  Repeat echocardiogram 03/2019 with EF 45 to 77%, LV diastolic parameters are consistent on the prior relaxation, RV normal size and function, mild to moderate TR.  Follows with Dr. Bettye Boeck regarding her B-cell lymphoma.  She has noticed radiation therapy.  She is having her Port-A-Cath removed later this month.  Last seen by Dr. Rockey Situ 03/2018. No changes were made at that time.   Reports no shortness of breath at rest. Reports stable dyspnea on exertion with what sounds like minimal activity such as walking outdoors. Improves with rest. Reports no chest pain, pressure, or tightness. No edema, orthopnea, PND. Reports no palpitations.   Her blood pressure at home is "good" per her family members reports. Cannot recall specific readings, but believes the systolic is typically Judy the 130s.   EKGs/Labs/Other Studies Reviewed:   The following studies were reviewed today:  Echo 07/2018 - Left ventricle: The cavity size was normal. There was mild    concentric hypertrophy. Systolic function was normal. The    estimated ejection fraction was Judy the range of 50% to 55%.    Regional wall motion abnormalities cannot be excluded, Judy the    setting of bundle branch block. Doppler parameters are consistent    with abnormal left ventricular relaxation (grade 1 diastolic    dysfunction).  - Left atrium: The atrium was mildly dilated.  - Right ventricle: Systolic function was normal.  - Tricuspid valve: There was mild-moderate regurgitation.  - Pulmonary arteries: Systolic pressure was within the normal    range.   Echo 03/2019  1. The left ventricle has mildly reduced systolic function, with an  ejection fraction of 45-50%. The cavity size was normal. There is mildly  increased left ventricular wall thickness.  Left ventricular diastolic  Doppler parameters are consistent with  impaired relaxation. Left ventricular diffuse hypokinesis.   2. The right ventricle has normal systolic function. The cavity was  normal. There is no increase Judy right ventricular wall thickness. Right  ventricular systolic pressure is upper normal to mildly elevated (25-30  mmHg plus central venous pressure).   3. Right atrial size was mildly dilated.   4. The mitral valve is degenerative. Mild thickening of the mitral valve  leaflet. There is mild mitral annular calcification present. No evidence  of mitral valve stenosis.   5. The tricuspid valve is not well visualized. Tricuspid valve  regurgitation is mild-moderate.   6. The aortic valve is tricuspid. Moderate thickening of the aortic  valve.   7. The aorta is normal unless otherwise noted.   EKG:  EKG is ordered today.  The ekg ordered today demonstrates NSR 99 bpm with RBBB.  No acute ST/T wave changes.  Recent Labs: 02/07/2020: ALT 13; BUN 20; Creatinine, Ser 1.04; Hemoglobin 11.9; Platelets 181; Potassium 3.6; Sodium 138  Recent Lipid Panel No results found for: CHOL, TRIG, HDL, CHOLHDL, VLDL, LDLCALC, LDLDIRECT  Home Medications  Current Meds  Medication Sig  . acetaminophen (TYLENOL) 500 MG tablet Take 1,000 mg by mouth 2 (two) times daily as needed for moderate pain or headache.  . alendronate (FOSAMAX) 70 MG tablet Take 70 mg by mouth every Saturday.   Marland Kitchen allopurinol (ZYLOPRIM) 100 MG tablet Take 0.5 tablets (50 mg total) by mouth daily.  Marland Kitchen amLODipine (NORVASC) 10 MG tablet Take 10 mg by mouth daily.  . Calcium Carb-Cholecalciferol (CALCIUM 600-D PO) Take 600 mg by mouth daily.  . clotrimazole-betamethasone (LOTRISONE) cream Apply 1 application topically 2 (two) times daily as needed (rash).   Marland Kitchen esomeprazole (NEXIUM) 20 MG capsule Take 20 mg by mouth daily before breakfast.   . fluticasone (FLONASE) 50 MCG/ACT nasal spray Place 1 spray into both  nostrils daily as needed for allergies or rhinitis.  . hydroxychloroquine (PLAQUENIL) 200 MG tablet Take 200 mg by mouth 2 (two) times daily.   Marland Kitchen lidocaine-prilocaine (EMLA) cream Apply to affected area once  . Multiple Vitamin (MULTIVITAMIN WITH MINERALS) TABS tablet Take 1 tablet by mouth daily.  . polyethylene glycol (MIRALAX / GLYCOLAX) packet Take 17 g by mouth daily.  . predniSONE (DELTASONE) 5 MG tablet Take 5 mg by mouth daily with breakfast.   . Pseudoephedrine-APAP 30-325 MG TABS Take 1 tablet by mouth daily as needed (sinuses).   . simvastatin (ZOCOR) 40 MG tablet Take 40 mg by mouth daily.   . vitamin B-12 (CYANOCOBALAMIN) 500 MCG tablet Take 500 mcg by mouth daily.      Review of Systems    Review of Systems  Constitutional: Negative for chills, fever and malaise/fatigue.  Cardiovascular: Positive for dyspnea on exertion. Negative for chest pain, irregular heartbeat, leg swelling, near-syncope, orthopnea, palpitations and syncope.  Respiratory: Negative for cough, shortness of breath and wheezing.   Gastrointestinal: Negative for melena, nausea and vomiting.  Genitourinary: Negative for hematuria.  Neurological: Negative for dizziness, light-headedness and weakness.   All other systems reviewed and are otherwise negative except as noted above.  Physical Exam    VS:  BP 134/84 (BP Location: Left Arm, Patient Position: Sitting, Cuff Size: Normal)   Pulse 89   Ht 4\' 10"  (1.473 m)   Wt 119 lb (54 kg)   SpO2 96%   BMI 24.87 kg/m  , BMI Body mass index is 24.87 kg/m. GEN: Well nourished, well developed, Judy no acute distress. HEENT: normal. Neck: Supple, no JVD, carotid bruits, or masses. Cardiac: RRR, no murmurs, rubs, or gallops. No clubbing, cyanosis, edema.  Radials/DP/PT 2+ and equal bilaterally.  Respiratory:  Respirations regular and unlabored, clear to auscultation bilaterally. GI: Soft, nontender, nondistended, BS + x 4. MS: No deformity or atrophy. Skin: Warm  and dry, no rash. Neuro:  Strength and sensation are intact. Psych: Normal affect.  Assessment & Plan    1. Preoperative cardiovascular examination - Upcoming portacath removal 02/28/20. Exercise tolerance >4METS. According to the Revised Cardiac Risk Index (RCRI), her Perioperative Risk of Major Cardiac Event is (%): 6.6.  Based on AHA/ACC guidelines she is deemed acceptable risk for the planned procedure without additional cardiovascular testing.  She does have recommendation for stress test due to mild decline Judy EF however this does not need to be completed prior to her procedure.  2. CAD -reports no chest pain, pressure, tightness.  Does note dyspnea on exertion which is likely multifactorial.  We discussed recommendation for stress test due to worsening EF and she politely declines, as below.  GDMT includes statin.  Beta-blocker  added today.  3. HFrEF -EF 45 to 50% by echo 03/2019.  Endorses DOE which is likely multifactorial.   As her EF has subsequently reduced from echocardiogram 07/2018 to echo 03/2019 we discussed indication for ischemic evaluation via stress test to determine whether her reduced malingering was due to CAD.  She declined stress test as she feels that the testing takes too long.  She additionally declines cardiac catheterization.  Encouraged to consider.  Start Coreg 31.25 mg twice daily for GDMT. Further optimization of GDMT at follow up. No indication for loop diuretic at this time.   4. Valvular heart disease -report stable dyspnea.  No lightheadedness, near syncope, chest pain.  Continue to monitor with periodic echo.  5. Anemia/Lymphoma - Continue to follow with oncology.  Disposition: Follow up Judy 3 week(s) with Dr. Rockey Situ or APP   Loel Dubonnet, NP 02/22/2020, 2:04 PM

## 2020-02-26 ENCOUNTER — Other Ambulatory Visit: Payer: Self-pay

## 2020-02-26 ENCOUNTER — Other Ambulatory Visit
Admission: RE | Admit: 2020-02-26 | Discharge: 2020-02-26 | Disposition: A | Discharge status: Home / Self Care | Payer: Medicare HMO | Source: Ambulatory Visit | Attending: Vascular Surgery | Admitting: Vascular Surgery

## 2020-02-26 DIAGNOSIS — Z20822 Contact with and (suspected) exposure to covid-19: Secondary | ICD-10-CM | POA: Insufficient documentation

## 2020-02-26 DIAGNOSIS — Z01812 Encounter for preprocedural laboratory examination: Secondary | ICD-10-CM | POA: Diagnosis not present

## 2020-02-26 LAB — SARS CORONAVIRUS 2 (TAT 6-24 HRS): SARS Coronavirus 2: NEGATIVE

## 2020-02-27 ENCOUNTER — Other Ambulatory Visit (INDEPENDENT_AMBULATORY_CARE_PROVIDER_SITE_OTHER): Payer: Self-pay | Admitting: Nurse Practitioner

## 2020-02-28 ENCOUNTER — Other Ambulatory Visit: Payer: Self-pay

## 2020-02-28 ENCOUNTER — Encounter: Payer: Self-pay | Admitting: Vascular Surgery

## 2020-02-28 ENCOUNTER — Encounter
Admission: RE | Disposition: A | Discharge status: Home / Self Care | Payer: Self-pay | Source: Home / Self Care | Attending: Vascular Surgery

## 2020-02-28 ENCOUNTER — Ambulatory Visit
Admission: RE | Admit: 2020-02-28 | Discharge: 2020-02-28 | Disposition: A | Discharge status: Home / Self Care | Payer: Medicare HMO | Attending: Vascular Surgery | Admitting: Vascular Surgery

## 2020-02-28 DIAGNOSIS — Z79899 Other long term (current) drug therapy: Secondary | ICD-10-CM | POA: Insufficient documentation

## 2020-02-28 DIAGNOSIS — Z87891 Personal history of nicotine dependence: Secondary | ICD-10-CM | POA: Diagnosis not present

## 2020-02-28 DIAGNOSIS — E785 Hyperlipidemia, unspecified: Secondary | ICD-10-CM | POA: Diagnosis not present

## 2020-02-28 DIAGNOSIS — I129 Hypertensive chronic kidney disease with stage 1 through stage 4 chronic kidney disease, or unspecified chronic kidney disease: Secondary | ICD-10-CM | POA: Insufficient documentation

## 2020-02-28 DIAGNOSIS — Z85028 Personal history of other malignant neoplasm of stomach: Secondary | ICD-10-CM | POA: Insufficient documentation

## 2020-02-28 DIAGNOSIS — K219 Gastro-esophageal reflux disease without esophagitis: Secondary | ICD-10-CM | POA: Diagnosis not present

## 2020-02-28 DIAGNOSIS — C8590 Non-Hodgkin lymphoma, unspecified, unspecified site: Secondary | ICD-10-CM

## 2020-02-28 DIAGNOSIS — C8330 Diffuse large B-cell lymphoma, unspecified site: Secondary | ICD-10-CM | POA: Diagnosis not present

## 2020-02-28 DIAGNOSIS — Z888 Allergy status to other drugs, medicaments and biological substances status: Secondary | ICD-10-CM | POA: Diagnosis not present

## 2020-02-28 DIAGNOSIS — Z452 Encounter for adjustment and management of vascular access device: Secondary | ICD-10-CM | POA: Diagnosis not present

## 2020-02-28 DIAGNOSIS — Z880 Allergy status to penicillin: Secondary | ICD-10-CM | POA: Insufficient documentation

## 2020-02-28 DIAGNOSIS — N189 Chronic kidney disease, unspecified: Secondary | ICD-10-CM | POA: Diagnosis not present

## 2020-02-28 DIAGNOSIS — Z882 Allergy status to sulfonamides status: Secondary | ICD-10-CM | POA: Diagnosis not present

## 2020-02-28 DIAGNOSIS — C8510 Unspecified B-cell lymphoma, unspecified site: Secondary | ICD-10-CM

## 2020-02-28 DIAGNOSIS — J984 Other disorders of lung: Secondary | ICD-10-CM | POA: Insufficient documentation

## 2020-02-28 HISTORY — PX: PORTA CATH REMOVAL: CATH118286

## 2020-02-28 SURGERY — PORTA CATH REMOVAL
Anesthesia: Moderate Sedation

## 2020-02-28 MED ORDER — MIDAZOLAM HCL 2 MG/ML PO SYRP: 8.0000 mg | ORAL_SOLUTION | Freq: Once | ORAL | Status: DC | PRN

## 2020-02-28 MED ORDER — SODIUM CHLORIDE 0.9 % IV SOLN
INTRAVENOUS | Status: DC
  Administered 2020-02-28: 1000 mL via INTRAVENOUS

## 2020-02-28 MED ORDER — MIDAZOLAM HCL 2 MG/2ML IJ SOLN
INTRAMUSCULAR | Status: DC | PRN
  Administered 2020-02-28: 2 mg via INTRAVENOUS

## 2020-02-28 MED ORDER — HYDROMORPHONE HCL 1 MG/ML IJ SOLN: 1.0000 mg | Freq: Once | INTRAMUSCULAR | Status: DC | PRN

## 2020-02-28 MED ORDER — CLINDAMYCIN PHOSPHATE 300 MG/50ML IV SOLN
INTRAVENOUS | Status: AC
  Administered 2020-02-28: 300 mg via INTRAVENOUS
  Filled 2020-02-28: qty 50

## 2020-02-28 MED ORDER — FENTANYL CITRATE (PF) 100 MCG/2ML IJ SOLN
INTRAMUSCULAR | Status: AC
  Filled 2020-02-28: qty 2

## 2020-02-28 MED ORDER — CHLORHEXIDINE GLUCONATE CLOTH 2 % EX PADS
6.0000 | MEDICATED_PAD | Freq: Every day | CUTANEOUS | Status: DC
  Administered 2020-02-28: 6 via TOPICAL

## 2020-02-28 MED ORDER — METHYLPREDNISOLONE SODIUM SUCC 125 MG IJ SOLR: 125.0000 mg | Freq: Once | INTRAMUSCULAR | Status: DC | PRN

## 2020-02-28 MED ORDER — FAMOTIDINE 20 MG PO TABS: 40.0000 mg | ORAL_TABLET | Freq: Once | ORAL | Status: DC | PRN

## 2020-02-28 MED ORDER — FENTANYL CITRATE (PF) 100 MCG/2ML IJ SOLN
INTRAMUSCULAR | Status: DC | PRN
  Administered 2020-02-28: 50 ug via INTRAVENOUS

## 2020-02-28 MED ORDER — MIDAZOLAM HCL 5 MG/5ML IJ SOLN
INTRAMUSCULAR | Status: AC
  Filled 2020-02-28: qty 5

## 2020-02-28 MED ORDER — ONDANSETRON HCL 4 MG/2ML IJ SOLN: 4.0000 mg | Freq: Four times a day (QID) | INTRAMUSCULAR | Status: DC | PRN

## 2020-02-28 MED ORDER — CLINDAMYCIN PHOSPHATE 300 MG/50ML IV SOLN: 300.0000 mg | Freq: Once | INTRAVENOUS | Status: AC

## 2020-02-28 SURGICAL SUPPLY — 12 items
ADH SKN CLS APL DERMABOND .7 (GAUZE/BANDAGES/DRESSINGS) ×1
DERMABOND ADVANCED (GAUZE/BANDAGES/DRESSINGS) ×2
DERMABOND ADVANCED .7 DNX12 (GAUZE/BANDAGES/DRESSINGS) IMPLANT
ELECT REM PT RETURN 9FT ADLT (ELECTROSURGICAL) ×3
ELECTRODE REM PT RTRN 9FT ADLT (ELECTROSURGICAL) IMPLANT
PACK ANGIOGRAPHY (CUSTOM PROCEDURE TRAY) ×2 IMPLANT
PENCIL ELECTRO HAND CTR (MISCELLANEOUS) ×2 IMPLANT
SUT MNCRL 4-0 (SUTURE) ×3
SUT MNCRL 4-0 27XMFL (SUTURE) ×1
SUT VIC AB 3-0 CT1 27 (SUTURE) ×3
SUT VIC AB 3-0 CT1 TAPERPNT 27 (SUTURE) IMPLANT
SUTURE MNCRL 4-0 27XMF (SUTURE) IMPLANT

## 2020-02-28 NOTE — Interval H&P Note (Signed)
History and Physical Interval Note:  02/28/2020 7:49 AM  [name]  has presented today for surgery, with the diagnosis of Porta Cath Removal   B cell lymphoma  Covid  Aug 10.  The various methods of treatment have been discussed with the patient and family. After consideration of risks, benefits and other options for treatment, the patient has consented to  Procedure(s): PORTA CATH REMOVAL (N/A) as a surgical intervention.  The patient's history has been reviewed, patient examined, no change in status, stable for surgery.  I have reviewed the patient's chart and labs.  Questions were answered to the patient's satisfaction.     Leotis Pain

## 2020-02-28 NOTE — Op Note (Signed)
[facility]       Operative Note  Date: 02/28/2020  Preoperative diagnosis:  1. Lymphoma, no longer using port  Postoperative diagnosis:  Same as above  Procedures: #1. Removal of right jugular port a cath   Surgeon: Leotis Pain, MD  Anesthesia: Local with moderate conscious sedation for 15 minutes using 2 mg of Versed and 50 mcg of Fentanyl  Fluoroscopy time: none  Contrast used: 0  Estimated blood loss: Minimal  Indication for the procedure:  The patient is a 74 y.o. female who has completed therapy and no longer needs their Port-A-Cath. The patient desires to have this removed. Risks and benefits including need for potential replacement with recurrent disease were discussed and patient is agreeable to proceed.  Description of procedure: The patient was brought to the vascular and interventional radiology suite. Moderate conscious sedation was administered during a face to face encounter with the patient throughout the procedure with my supervision of the RN administering medicines and monitoring the patient's vital signs, pulse oximetry, telemetry and mental status throughout from the start of the procedure until the patient was taken to the recovery room.  The right neck chest and shoulder were sterilely prepped and draped, and a sterile surgical field was created. The area was then anesthetized with 1% lidocaine copiously. The previous incision was reopened and electrocautery used to dissected down to the port and the catheter. These were dissected free and the catheter was gently removed from the vein in its entirety. The port was dissected out from the fibrous connective tissue and the Prolene sutures were removed. The port was then removed in its entirety including the catheter. The wound was then closed with a 3-0 Vicryl and a 4-0 Monocryl and Dermabond was placed as a dressing. The patient was then taken to the recovery  room in stable condition having tolerated the procedure well.  Complications: none  Condition: stable   Leotis Pain, MD 02/28/2020 9:12 AM   This note was created with Dragon Medical transcription system. Any errors in dictation are purely unintentional.

## 2020-02-28 NOTE — Discharge Instructions (Signed)
Implanted Port Removal, Care After °This sheet gives you information about how to care for yourself after your procedure. Your health care provider may also give you more specific instructions. If you have problems or questions, contact your health care provider. °What can I expect after the procedure? °After the procedure, it is common to have: °· Soreness or pain near your incision. °· Some swelling or bruising near your incision. °Follow these instructions at home: °Medicines °· Take over-the-counter and prescription medicines only as told by your health care provider. °· If you were prescribed an antibiotic medicine, take it as told by your health care provider. Do not stop taking the antibiotic even if you start to feel better. °Bathing °· Do not take baths, swim, or use a hot tub until your health care provider approves. Ask your health care provider if you can take showers. You may only be allowed to take sponge baths. °Incision care ° °· Follow instructions from your health care provider about how to take care of your incision. Make sure you: °? Wash your hands with soap and water before you change your bandage (dressing). If soap and water are not available, use hand sanitizer. °? Change your dressing as told by your health care provider. °? Keep your dressing dry. °? Leave stitches (sutures), skin glue, or adhesive strips in place. These skin closures may need to stay in place for 2 weeks or longer. If adhesive strip edges start to loosen and curl up, you may trim the loose edges. Do not remove adhesive strips completely unless your health care provider tells you to do that. °· Check your incision area every day for signs of infection. Check for: °? More redness, swelling, or pain. °? More fluid or blood. °? Warmth. °? Pus or a bad smell. °Driving ° °· Do not drive for 24 hours if you were given a medicine to help you relax (sedative) during your procedure. °· If you did not receive a sedative, ask your  health care provider when it is safe to drive. °Activity °· Return to your normal activities as told by your health care provider. Ask your health care provider what activities are safe for you. °· Do not lift anything that is heavier than 10 lb (4.5 kg), or the limit that you are told, until your health care provider says that it is safe. °· Do not do activities that involve lifting your arms over your head. °General instructions °· Do not use any products that contain nicotine or tobacco, such as cigarettes and e-cigarettes. These can delay healing. If you need help quitting, ask your health care provider. °· Keep all follow-up visits as told by your health care provider. This is important. °Contact a health care provider if: °· You have more redness, swelling, or pain around your incision. °· You have more fluid or blood coming from your incision. °· Your incision feels warm to the touch. °· You have pus or a bad smell coming from your incision. °· You have pain that is not relieved by your pain medicine. °Get help right away if you have: °· A fever or chills. °· Chest pain. °· Difficulty breathing. °Summary °· After the procedure, it is common to have pain, soreness, swelling, or bruising near your incision. °· If you were prescribed an antibiotic medicine, take it as told by your health care provider. Do not stop taking the antibiotic even if you start to feel better. °· Do not drive for 24 hours   if you were given a sedative during your procedure. °· Return to your normal activities as told by your health care provider. Ask your health care provider what activities are safe for you. °This information is not intended to replace advice given to you by your health care provider. Make sure you discuss any questions you have with your health care provider. °Document Revised: 08/18/2017 Document Reviewed: 08/18/2017 °Elsevier Patient Education © 2020 [employer] Inc[employer] ° ° ° °Moderate Conscious Sedation, Adult, Care  After °These instructions provide you with information about caring for yourself after your procedure. Your health care provider may also give you more specific instructions. Your treatment has been planned according to current medical practices, but problems sometimes occur. Call your health care provider if you have any problems or questions after your procedure. °What can I expect after the procedure? °After your procedure, it is common: °· To feel sleepy for several hours. °· To feel clumsy and have poor balance for several hours. °· To have poor judgment for several hours. °· To vomit if you eat too soon. °Follow these instructions at home: °For at least 24 hours after the procedure: ° °· Do not: °? Participate in activities where you could fall or become injured. °? Drive. °? Use heavy machinery. °? Drink alcohol. °? Take sleeping pills or medicines that cause drowsiness. °? Make important decisions or sign legal documents. °? Take care of children on your own. °· Rest. °Eating and drinking °· Follow the diet recommended by your health care provider. °· If you vomit: °? Drink water, juice, or soup when you can drink without vomiting. °? Make sure you have little or no nausea before eating solid foods. °General instructions °· Have a responsible adult stay with you until you are awake and alert. °· Take over-the-counter and prescription medicines only as told by your health care provider. °· If you smoke, do not smoke without supervision. °· Keep all follow-up visits as told by your health care provider. This is important. °Contact a health care provider if: °· You keep feeling nauseous or you keep vomiting. °· You feel light-headed. °· You develop a rash. °· You have a fever. °Get help right away if: °· You have trouble breathing. °This information is not intended to replace advice given to you by your health care provider. Make sure you discuss any questions you have with your health care provider. °Document  Revised: 06/17/2017 Document Reviewed: 10/25/2015 °Elsevier Patient Education © 2020 Elsevier Inc. ° °

## 2020-03-04 DIAGNOSIS — I10 Essential (primary) hypertension: Secondary | ICD-10-CM | POA: Diagnosis not present

## 2020-03-05 ENCOUNTER — Telehealth: Payer: Self-pay | Admitting: Gastroenterology

## 2020-03-05 NOTE — Telephone Encounter (Signed)
LVM for patient to call our office to schedule a F/U with Dr. Bonna Gains per Provider.

## 2020-03-11 ENCOUNTER — Telehealth: Payer: Self-pay | Admitting: Family

## 2020-03-11 ENCOUNTER — Telehealth: Payer: Self-pay | Admitting: Gastroenterology

## 2020-03-11 DIAGNOSIS — R0600 Dyspnea, unspecified: Secondary | ICD-10-CM

## 2020-03-11 DIAGNOSIS — I129 Hypertensive chronic kidney disease with stage 1 through stage 4 chronic kidney disease, or unspecified chronic kidney disease: Secondary | ICD-10-CM | POA: Diagnosis not present

## 2020-03-11 DIAGNOSIS — Z0001 Encounter for general adult medical examination with abnormal findings: Secondary | ICD-10-CM | POA: Diagnosis not present

## 2020-03-11 DIAGNOSIS — M3214 Glomerular disease in systemic lupus erythematosus: Secondary | ICD-10-CM | POA: Diagnosis not present

## 2020-03-11 DIAGNOSIS — E7800 Pure hypercholesterolemia, unspecified: Secondary | ICD-10-CM | POA: Diagnosis not present

## 2020-03-11 DIAGNOSIS — Z1231 Encounter for screening mammogram for malignant neoplasm of breast: Secondary | ICD-10-CM | POA: Diagnosis not present

## 2020-03-11 DIAGNOSIS — N184 Chronic kidney disease, stage 4 (severe): Secondary | ICD-10-CM | POA: Diagnosis not present

## 2020-03-11 DIAGNOSIS — I519 Heart disease, unspecified: Secondary | ICD-10-CM

## 2020-03-11 NOTE — Telephone Encounter (Signed)
Called patient and left a VM regarding Myoview. Informed her that the front office would be calling and that I am sending a copy of the instructions for her to review to her house as she does not have MyChart. I advised that she speak with one of the nurses here before she has Myoview if she has any questions or has not received the below instructions I have sent to her.   [place]  Your caregiver has ordered a Stress Test with nuclear imaging. The purpose of this test is to evaluate the blood supply to your heart muscle. This procedure is referred to as a "Non-Invasive Stress Test." This is because other than having an IV started in your vein, nothing is inserted or "invades" your body. Cardiac stress tests are done to find areas of poor blood flow to the heart by determining the extent of coronary artery disease (CAD). Some patients exercise on a treadmill, which naturally increases the blood flow to your heart, while others who are  unable to walk on a treadmill due to physical limitations have a pharmacologic/chemical stress agent called Lexiscan . This medicine will mimic walking on a treadmill by temporarily increasing your coronary blood flow.      PLEASE REPORT TO [facility] ENTRANCE  THE VOLUNTEERS AT THE FIRST DESK WILL DIRECT YOU WHERE TO GO                  (Please note: these test may take anywhere between 2-4 hours to complete)    Instructions regarding medication:   __XX__:  Hold betablocker(s) night before procedure and morning of procedure:                  carvedilol (COREG)    PLEASE NOTIFY THE OFFICE AT LEAST 24 HOURS IN ADVANCE IF YOU ARE UNABLE TO KEEP YOUR APPOINTMENT.  [phone] AND  PLEASE NOTIFY NUCLEAR MEDICINE AT [facility] AT LEAST 24 HOURS IN ADVANCE IF YOU ARE UNABLE TO KEEP YOUR APPOINTMENT. [phone]    How to prepare for your Myoview test:  1. Do not eat or drink after midnight 2. No caffeine for 24 hours prior to test 3. No smoking 24  hours prior to test. 4. Your medication may be taken with water.  If your doctor stopped a medication because of this test, do not take that medication. 5. Ladies, please do not wear dresses.  Skirts or pants are appropriate. Please wear a short sleeve shirt. 6. No perfume, cologne or lotion. 7. Wear comfortable walking shoes. No heels!

## 2020-03-11 NOTE — Telephone Encounter (Signed)
At last clinic visit we discussed indication for [facility] due to decline in EF and dyspnea. If she would like to schedule, okay to order. If I need to call her, I don't mind doing so tomorrow   Laurann Montana, NP

## 2020-03-11 NOTE — Telephone Encounter (Signed)
Patient would like to schedule stress test. Please call to discuss.

## 2020-03-11 NOTE — Telephone Encounter (Signed)
Called patient 3x to schedule appointment per Dr. Bonna Gains..Unable to contact patient. A letter will be mailed out today.

## 2020-03-14 NOTE — Telephone Encounter (Signed)
Attempted to call patient to go over myoview stress test instructions. No answer. Left message to call back.

## 2020-03-14 NOTE — Telephone Encounter (Signed)
Patient is scheduled for 03/18/20.

## 2020-03-18 ENCOUNTER — Encounter
Admission: RE | Admit: 2020-03-18 | Discharge: 2020-03-18 | Disposition: A | Discharge status: Home / Self Care | Payer: Medicare HMO | Source: Ambulatory Visit | Attending: Family | Admitting: Family

## 2020-03-18 ENCOUNTER — Other Ambulatory Visit: Payer: Self-pay

## 2020-03-18 ENCOUNTER — Ambulatory Visit: Payer: Medicare HMO | Admitting: Family

## 2020-03-18 DIAGNOSIS — R0600 Dyspnea, unspecified: Secondary | ICD-10-CM | POA: Diagnosis not present

## 2020-03-18 DIAGNOSIS — I519 Heart disease, unspecified: Secondary | ICD-10-CM | POA: Diagnosis not present

## 2020-03-18 MED ORDER — REGADENOSON 0.4 MG/5ML IV SOLN
0.4000 mg | Freq: Once | INTRAVENOUS | Status: AC
  Administered 2020-03-18: 0.4 mg via INTRAVENOUS

## 2020-03-18 MED ORDER — TECHNETIUM TC 99M TETROFOSMIN IV KIT
30.4000 | PACK | Freq: Once | INTRAVENOUS | Status: AC | PRN
  Administered 2020-03-18: 30.4 via INTRAVENOUS

## 2020-03-18 MED ORDER — TECHNETIUM TC 99M TETROFOSMIN IV KIT
9.9700 | PACK | Freq: Once | INTRAVENOUS | Status: AC | PRN
  Administered 2020-03-18: 9.97 via INTRAVENOUS

## 2020-03-19 LAB — NM MYOCAR MULTI W/SPECT W/WALL MOTION / EF
Estimated workload: 1 METS
Exercise duration (min): 0 min
Exercise duration (sec): 0 s
LV dias vol: 76 mL (ref 46–106)
LV sys vol: 32 mL
MPHR: 148 {beats}/min
Peak HR: 100 {beats}/min
Percent HR: 67 %
Rest HR: 77 {beats}/min
SDS: 7
SRS: 0
SSS: 8
TID: 1.11

## 2020-03-20 DIAGNOSIS — M810 Age-related osteoporosis without current pathological fracture: Secondary | ICD-10-CM | POA: Diagnosis not present

## 2020-03-20 DIAGNOSIS — R801 Persistent proteinuria, unspecified: Secondary | ICD-10-CM | POA: Diagnosis not present

## 2020-03-20 DIAGNOSIS — M329 Systemic lupus erythematosus, unspecified: Secondary | ICD-10-CM | POA: Diagnosis not present

## 2020-03-20 DIAGNOSIS — C8580 Other specified types of non-Hodgkin lymphoma, unspecified site: Secondary | ICD-10-CM | POA: Diagnosis not present

## 2020-03-27 DIAGNOSIS — Z8679 Personal history of other diseases of the circulatory system: Secondary | ICD-10-CM | POA: Diagnosis not present

## 2020-03-27 DIAGNOSIS — M2012 Hallux valgus (acquired), left foot: Secondary | ICD-10-CM | POA: Diagnosis not present

## 2020-04-02 ENCOUNTER — Other Ambulatory Visit: Payer: Self-pay

## 2020-04-02 ENCOUNTER — Encounter: Payer: Self-pay | Admitting: Physician Assistant

## 2020-04-02 ENCOUNTER — Ambulatory Visit: Payer: Medicare HMO | Admitting: Physician Assistant

## 2020-04-02 VITALS — BP 136/78 | HR 98 | Ht 59.0 in | Wt 116.0 lb

## 2020-04-02 DIAGNOSIS — E785 Hyperlipidemia, unspecified: Secondary | ICD-10-CM

## 2020-04-02 DIAGNOSIS — Z5320 Procedure and treatment not carried out because of patient's decision for unspecified reasons: Secondary | ICD-10-CM | POA: Diagnosis not present

## 2020-04-02 DIAGNOSIS — I25118 Atherosclerotic heart disease of native coronary artery with other forms of angina pectoris: Secondary | ICD-10-CM | POA: Diagnosis not present

## 2020-04-02 DIAGNOSIS — I5020 Unspecified systolic (congestive) heart failure: Secondary | ICD-10-CM | POA: Diagnosis not present

## 2020-04-02 DIAGNOSIS — I10 Essential (primary) hypertension: Secondary | ICD-10-CM

## 2020-04-02 DIAGNOSIS — Z951 Presence of aortocoronary bypass graft: Secondary | ICD-10-CM | POA: Diagnosis not present

## 2020-04-02 DIAGNOSIS — R0600 Dyspnea, unspecified: Secondary | ICD-10-CM

## 2020-04-02 DIAGNOSIS — I739 Peripheral vascular disease, unspecified: Secondary | ICD-10-CM | POA: Diagnosis not present

## 2020-04-02 NOTE — Patient Instructions (Addendum)
Medication Instructions:  - Your physician recommends that you continue on your current medications as directed. Please refer to the Current Medication list given to you today.  *If you need a refill on your cardiac medications before your next appointment, please call your pharmacy*   Lab Work: - Your physician recommends that you have lab work today: BMP (patient refused)  If you have labs (blood work) drawn today and your tests are completely normal, you will receive your results only by: Marland Kitchen MyChart Message (if you have MyChart) OR . A paper copy in the mail If you have any lab test that is abnormal or we need to change your treatment, we will call you to review the results.   Testing/Procedures: - none ordered   Follow-Up: At [facility], you and your health needs are our priority.  As part of our continuing mission to provide you with exceptional heart care, we have created designated Provider Care Teams.  These Care Teams include your primary Cardiologist (physician) and Advanced Practice Providers (APPs -  Physician Assistants and Nurse Practitioners) who all work together to provide you with the care you need, when you need it.  We recommend signing up for the patient portal called "MyChart".  Sign up information is provided on this After Visit Summary.  MyChart is used to connect with patients for Virtual Visits (Telemedicine).  Patients are able to view lab/test results, encounter notes, upcoming appointments, etc.  Non-urgent messages can be sent to your provider as well.   To learn more about what you can do with MyChart, go to [email or web address].    Your next appointment:   6-8 week(s)  The format for your next appointment:   In Person  Provider:    You may see Ida Rogue, MD or one of the following Advanced Practice Providers on your designated Care Team:    Murray Hodgkins, NP  Christell Faith, PA-C  Marrianne Mood, PA-C    Other  Instructions -n/a

## 2020-04-02 NOTE — Progress Notes (Signed)
ldl   Office Visit    Patient Name: Judy Torres Date of Encounter: 04/02/2020  Primary Care Provider:  [name], MD Primary Cardiologist:  Ida Rogue, MD  Chief Complaint    Chief Complaint  Patient presents with  . office visit    F/U after stress test; Meds verbally reviewed with patient.    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  Past Medical History    Past Medical History:  Diagnosis Date  . Cancer [facility])    Stomach cancer  . Chronic kidney disease   . GERD (gastroesophageal reflux disease)   . HTN (hypertension)   . Hyperlipidemia   . Lupus ([place])   . S/P angioplasty with stent   . Wears dentures    full upper and lower   Past Surgical History:  Procedure Laterality Date  . CARDIAC CATHETERIZATION     stent  . CARDIAC SURGERY    . CATARACT EXTRACTION W/PHACO Right 11/15/2019   Procedure: CATARACT EXTRACTION PHACO AND INTRAOCULAR LENS PLACEMENT (IOC) RIGHT  VISION BLUE 7.48 00:45.8;  Surgeon: Marchia Meiers, MD;  Location: [place];  Service: Ophthalmology;  Laterality: Right;  Port a cath  . CATARACT EXTRACTION W/PHACO Left 12/20/2019   Procedure: CATARACT EXTRACTION PHACO AND INTRAOCULAR LENS PLACEMENT ([place]) LEFT VISION BLUE;  Surgeon: Marchia Meiers, MD;  Location: [place];  Service: Ophthalmology;  Laterality: Left;  6.17 0:36.1  . CESAREAN SECTION    . COLONOSCOPY N/A 12/30/2017   Procedure: COLONOSCOPY;  Surgeon: Lin Landsman, MD;  Location: [facility] ENDOSCOPY;  Service: Gastroenterology;  Laterality: N/A;  . CORONARY ARTERY BYPASS GRAFT  2003  . ESOPHAGOGASTRODUODENOSCOPY N/A 12/30/2017   Procedure: ESOPHAGOGASTRODUODENOSCOPY (EGD);  Surgeon: Lin Landsman, MD;  Location: [facility] ENDOSCOPY;  Service: Gastroenterology;  Laterality: N/A;   . ESOPHAGOGASTRODUODENOSCOPY Left 02/05/2018   Procedure: ESOPHAGOGASTRODUODENOSCOPY (EGD);  Surgeon: Virgel Manifold, MD;  Location: [facility] ENDOSCOPY;  Service: Endoscopy;  Laterality: Left;  . ESOPHAGOGASTRODUODENOSCOPY (EGD) WITH PROPOFOL N/A 03/15/2018   Procedure: ESOPHAGOGASTRODUODENOSCOPY (EGD) WITH PROPOFOL;  Surgeon: Virgel Manifold, MD;  Location: [facility] ENDOSCOPY;  Service: Endoscopy;  Laterality: N/A;  . FLEXIBLE SIGMOIDOSCOPY N/A 12/31/2017   Procedure: FLEXIBLE SIGMOIDOSCOPY;  Surgeon: Jonathon Bellows, MD;  Location: [facility] ENDOSCOPY;  Service: Gastroenterology;  Laterality: N/A;  . PORTA CATH INSERTION N/A 04/19/2018   Procedure: PORTA CATH INSERTION;  Surgeon: Algernon Huxley, MD;  Location: [place] CV LAB;  Service: Cardiovascular;  Laterality: N/A;  . PORTA CATH REMOVAL N/A 02/28/2020   Procedure: PORTA CATH REMOVAL;  Surgeon: Algernon Huxley, MD;  Location: [place] CV LAB;  Service: Cardiovascular;  Laterality: N/A;    Allergies  Allergies  Allergen Reactions  . Penicillins Other (See Comments)    Pass out Has patient had a PCN reaction causing immediate rash, facial/tongue/throat swelling, SOB or lightheadedness with hypotension: Yes Has patient had a PCN reaction causing severe rash involving mucus membranes or skin necrosis: No Has patient had a PCN reaction that required hospitalization: No Has patient had a PCN reaction occurring within the last 10 years: No If all of the above answers are "NO", then may proceed with Cephalosporin use.   . Sulfa Antibiotics Nausea Only  . Sulfasalazine Nausea And Vomiting       . Ace Inhibitors Nausea Only  . Losartan Anxiety  .  Spironolactone Other (See Comments)    Felt poorly but couldn't clarify     History of Present Illness    Judy Nancy Fetter Gallen is a 74 y.o. female with PMH as above.   She has history of CAD s/p LHC 2004.  She was previously followed by Dr. Nehemiah Massed.  2004 cath showed severe two-vessel disease with  total occlusions of the LAD and LCx, which both failed with R-L collaterals.  She had CABG Judy 2004.  Per [facility] records, she had an exercise stress test Judy 2019, which was low risk.  03/2018 echo showed LVEF 55 to 60%.  07/2018 repeat echo LVEF 50 to 55%, G1DD, mild to moderate TR.  03/2019 LVEF 45 to 60%, G1DD, mild to moderate TR.  She follows with Dr. Susy Manor regarding her B-cell lymphoma with radiation therapy.  She was seen by Dr. Rockey Situ 03/2018 with no medication changes.  She was last seen by Laurann Montana, NP, 02/22/2020 for preoperative cardiovascular examination and deemed acceptable risk from a cardiovascular standpoint and without further testing for Port-A-Cath removal, planned for 02/28/2020.  It was noted that stress testing was recommended due to mild decline Judy EF; however, completion of MPI should not hold up her upcoming procedure.  At the time of her 8/6 visit, she politely declined MPI, as she felt stress testing took too long.  She also declined cardiac catheterization.  She was encouraged to continue to consider both options for further work-up.  She was started on carvedilol 3.125 mg twice daily for GDMT.  It was not felt she needed a loop diuretic at that time.  At that time, she reported stable DOE with minimal activity, such as walking outdoors.  Breathing improved with rest.  It was thought that her DOE was likely multifactorial.  Her home blood pressure was reportedly good per family member reports, but she was unable to recall specific readings and estimated SBP typically Judy the 130s.    Today, 04/02/2020, she returns to clinic and notes that she is doing well.  She denies chest pain, racing heart rate, palpitations, presyncope, and syncope.  She denies shortness of breath at rest, orthopnea, PND, abdominal distention, lower extremity edema, and early satiety.  She continues to note stable DOE with minimal activity.  She has stopped carvedilol 3.125 mg twice daily, given she says she  did not tolerate this medication.  On further questioning, she reports that it made her feel fatigued and overall not well.  She reports that she is able to garden for at least 2 hours before experiencing DOE.  Reviewed previous recommendation that she undergo further work-up with MPI with patient preference to defer this testing.  Also discussed further work-up with cardiac catheterization, also politely declined today.  She feels that she has been through a lot, and she would prefer to stop testing and enjoy life at this time.  She enjoys gardening and doing crochet Judy her spare time.  Home Medications    Prior to Admission medications   Medication Sig Start Date End Date Taking? Authorizing Provider  acetaminophen (TYLENOL) 500 MG tablet Take 1,000 mg by mouth 2 (two) times daily as needed for moderate pain or headache.    [provider]  alendronate (FOSAMAX) 70 MG tablet Take 70 mg by mouth every Saturday.     [provider]  allopurinol (ZYLOPRIM) 100 MG tablet Take 0.5 tablets (50 mg total) by mouth daily. 09/17/19   Lequita Asal, MD  amLODipine ([place]) 10  MG tablet Take 10 mg by mouth daily. 08/15/18   [provider]  Calcium Carb-Cholecalciferol (CALCIUM 600-D PO) Take 600 mg by mouth daily.    [provider]  carvedilol (COREG) 6.25 MG tablet Take 0.5 tablets (3.125 mg total) by mouth 2 (two) times daily. 02/22/20   Loel Dubonnet, NP  clotrimazole-betamethasone (LOTRISONE) cream Apply 1 application topically 2 (two) times daily as needed (rash).  08/24/16   [provider]  esomeprazole (NEXIUM) 20 MG capsule Take 20 mg by mouth daily before breakfast.     [provider]  fluticasone (FLONASE) 50 MCG/ACT nasal spray Place 1 spray into both nostrils daily as needed for allergies or rhinitis.    [provider]  hydroxychloroquine (PLAQUENIL) 200 MG tablet Take 200 mg by mouth 2 (two) times daily.  01/22/14   [provider]  lidocaine-prilocaine (EMLA) cream Apply to affected area once 04/24/18   Lequita Asal, MD  Multiple Vitamin (MULTIVITAMIN WITH MINERALS) TABS tablet Take 1 tablet by mouth daily. 01/03/18   Salary, Avel Peace, MD  ondansetron (ZOFRAN) 4 MG tablet Take 1 tablet (4 mg total) by mouth 2 (two) times daily as needed for refractory nausea / vomiting. Start on day 3 after cyclophosphamide chemotherapy. 07/24/18   Karen Kitchens, NP  polyethylene glycol (MIRALAX / GLYCOLAX) packet Take 17 g by mouth daily.    [provider]  predniSONE (DELTASONE) 5 MG tablet Take 5 mg by mouth daily with breakfast.  12/12/13   [provider]  Pseudoephedrine-APAP 30-325 MG TABS Take 1 tablet by mouth daily as needed (sinuses).     [provider]  simvastatin (ZOCOR) 40 MG tablet Take 40 mg by mouth daily.  08/15/14   [provider]  vitamin B-12 (CYANOCOBALAMIN) 500 MCG tablet Take 500 mcg by mouth daily.    [provider]    Review of Systems    She denies chest pain, palpitations, pnd, orthopnea, n, v, dizziness, syncope, edema, weight gain, or early satiety.  She reports stable dyspnea with minimal activity.   All other systems reviewed and are otherwise negative except as noted above.  Physical Exam    VS:  BP 136/78 (BP Location: Left Arm, Patient Position: Sitting, Cuff Size: Normal)   Pulse 98   Ht 4\' 11"  (1.499 m)   Wt 116 lb (52.6 kg)   SpO2 96%   BMI 23.43 kg/m  , BMI Body mass index is 23.43 kg/m. GEN: Well nourished, well developed, Judy no acute distress. HEENT: normal. Neck: Supple, no JVD, carotid bruits, or masses. Cardiac: RRR, 2/6 systolic murmur murmurs, rubs, or gallops. No clubbing, cyanosis, moderate bilateral edema.  Radials/DP/PT 2+ and equal bilaterally.  Respiratory:  Respirations regular and unlabored, clear to auscultation bilaterally. GI: Soft, nontender, nondistended, BS + x 4. MS: no deformity or atrophy. Skin: warm  and dry, no rash. Neuro:  Strength and sensation are intact. Psych: Normal affect.  Accessory Clinical Findings    ECG personally reviewed by me today -no new tracings- no acute changes.  VITALS Reviewed today   Temp Readings from Last 3 Encounters:  02/28/20 97.8 F (36.6 C) (Oral)  02/07/20 (!) 96 F (35.6 C) (Tympanic)  02/01/20 97.8 F (36.6 C)   BP Readings from Last 3 Encounters:  04/02/20 136/78  02/28/20 121/65  02/22/20 134/84   Pulse Readings from Last 3 Encounters:  04/02/20 98  02/28/20 79  02/22/20 89    Wt Readings  from Last 3 Encounters:  04/02/20 116 lb (52.6 kg)  02/28/20 119 lb (54 kg)  02/22/20 119 lb (54 kg)     LABS  reviewed today   Lab Results  Component Value Date   WBC 6.8 02/07/2020   HGB 11.9 (L) 02/07/2020   HCT 37.0 02/07/2020   MCV 100.0 02/07/2020   PLT 181 02/07/2020   Lab Results  Component Value Date   CREATININE 1.04 (H) 02/07/2020   BUN 20 02/07/2020   NA 138 02/07/2020   K 3.6 02/07/2020   CL 107 02/07/2020   CO2 22 02/07/2020   Lab Results  Component Value Date   ALT 13 02/07/2020   AST 22 02/07/2020   ALKPHOS 58 02/07/2020   BILITOT 0.5 02/07/2020   No results found for: CHOL, HDL, LDLCALC, LDLDIRECT, TRIG, CHOLHDL  No results found for: HGBA1C Lab Results  Component Value Date   TSH 2.483 01/22/2019    08/2019 total cholesterol 225, triglycerides 177, HDL 72.5, LDL 117, STUDIES/PROCEDURES reviewed today   Echo 07/2018 - Left ventricle: The cavity size was normal. There was mild  concentric hypertrophy. Systolic function was normal. The  estimated ejection fraction was Judy the range of 50% to 55%.  Regional wall motion abnormalities cannot be excluded, Judy the  setting of bundle branch block. Doppler parameters are consistent  with abnormal left ventricular relaxation (grade 1 diastolic  dysfunction).  - Left atrium: The atrium was mildly dilated.  - Right ventricle: Systolic function was  normal.  - Tricuspid valve: There was mild-moderate regurgitation.  - Pulmonary arteries: Systolic pressure was within the normal  range.   Echo 03/2019 1. The left ventricle has mildly reduced systolic function, with an  ejection fraction of 45-50%. The cavity size was normal. There is mildly  increased left ventricular wall thickness. Left ventricular diastolic  Doppler parameters are consistent with  impaired relaxation. Left ventricular diffuse hypokinesis.  2. The right ventricle has normal systolic function. The cavity was  normal. There is no increase Judy right ventricular wall thickness. Right  ventricular systolic pressure is upper normal to mildly elevated (25-30  mmHg plus central venous pressure).  3. Right atrial size was mildly dilated.  4. The mitral valve is degenerative. Mild thickening of the mitral valve  leaflet. There is mild mitral annular calcification present. No evidence  of mitral valve stenosis.  5. The tricuspid valve is not well visualized. Tricuspid valve  regurgitation is mild-moderate.  6. The aortic valve is tricuspid. Moderate thickening of the aortic  valve.  7. The aorta is normal unless otherwise noted.   Assessment & Plan    CAD s/p CABG (2004) -Reports dyspnea with minimal activity though stable from previous visit.  Consider as anginal equivalent, especially given decreased EF as above from echo 07/2020 echo 03/2019 45 to 50%.  Reviewed previous recommendations for further ischemic work-up with MPI or catheterization.  She politely declines both today, preferring to take time to enjoy life rather than undergo further testing.  She has self discontinued carvedilol 3.125 mg twice daily, as it made her feel tired and poor overall.  Risk factor modification was recommended, including cholesterol control.  Care everywhere LDL 117 with total cholesterol 225 and recommendation to increase statin, politely declined by patient today.   Continue  amlodipine and current statin.  BP today elevated with patient preference to avoid any medication changes or labs.  She will let us know if she has any worsening or new symptoms.  Revisit further work-up at RTC.  HFrEF (EF 45 to 50%, 03/2019) --Reports dyspnea with minimal activity.  03/2019 echo as above with EF decreased to 45 to 50%; however, she continues to decline further ischemic evaluation at this time.  She will call us if she changes her mind.  She does appear slightly volume up today with 1+ bilateral edema.  Judy addition, 2/6 systolic murmur heard on exam.  She prefers to defer any further work-up or medication changes at this time.  Continue current amlodipine 10 mg daily.  BB self discontinued by patient with preference to avoid medication changes today.  She is not on a loop diuretic with weight down from that of previous visit.  Reassess desire for GDMT at follow-up.  Essential hypertension -BP today suboptimal at 136/78 with goal BP 130/80 or lower.  Discussed possible antihypertensive therapy with patient preference to avoid any medication changes today, as well as labs.  Recommend lifestyle modification, including salt under 2 g and total volume under 2 L daily.  Reassess at RTC.  Recommend BP monitoring at home.  Valvular heart disease -Reports stable dyspnea.  Previous echo as above.  2/6 systolic murmur heard on exam.  She denies any lightheadedness, near-syncope, syncope, or chest pain.  Continue to recommend periodic echo to monitor.  Anemia/lymphoma -Continue to follow with oncology as scheduled/directed.  History of arterial femoral bypass (2003) --Denies any symptoms of claudication.  Continue to recommend lipid and BP control.  Patient preference was to defer any antihypertensive medication changes.  Continue ASA and current statin.  Recommended escalation of statin with patient preference to defer.  Ongoing lifestyle modification.  Medication changes: None per patient  preference.  She self discontinued carvedilol. Labs ordered: None per patient preference. Studies / Imaging ordered: None per patient preference. Future considerations: Stress testing or cardiac catheterization for further ischemic work-up, periodic echo to monitor valvular disease, escalation of statin therapy for LDL control, escalation of antihypertensives for BP control. Disposition: RTC Judy 1 - 2 months. ace-to-face time >50%.    Arvil Chaco, PA-C 04/02/2020

## 2020-04-22 ENCOUNTER — Ambulatory Visit (INDEPENDENT_AMBULATORY_CARE_PROVIDER_SITE_OTHER): Payer: Medicare HMO | Admitting: Vascular Surgery

## 2020-04-22 ENCOUNTER — Other Ambulatory Visit: Payer: Self-pay

## 2020-04-22 ENCOUNTER — Other Ambulatory Visit (INDEPENDENT_AMBULATORY_CARE_PROVIDER_SITE_OTHER): Payer: Medicare HMO

## 2020-04-22 ENCOUNTER — Other Ambulatory Visit (INDEPENDENT_AMBULATORY_CARE_PROVIDER_SITE_OTHER): Payer: Self-pay | Admitting: Vascular Surgery

## 2020-04-22 ENCOUNTER — Telehealth (INDEPENDENT_AMBULATORY_CARE_PROVIDER_SITE_OTHER): Payer: Self-pay | Admitting: Gastroenterology

## 2020-04-22 ENCOUNTER — Encounter (INDEPENDENT_AMBULATORY_CARE_PROVIDER_SITE_OTHER): Payer: Self-pay | Admitting: Vascular Surgery

## 2020-04-22 VITALS — BP 150/79 | HR 88 | Resp 16 | Wt 117.0 lb

## 2020-04-22 DIAGNOSIS — N184 Chronic kidney disease, stage 4 (severe): Secondary | ICD-10-CM | POA: Diagnosis not present

## 2020-04-22 DIAGNOSIS — Z95828 Presence of other vascular implants and grafts: Secondary | ICD-10-CM | POA: Diagnosis not present

## 2020-04-22 DIAGNOSIS — I739 Peripheral vascular disease, unspecified: Secondary | ICD-10-CM

## 2020-04-22 DIAGNOSIS — I10 Essential (primary) hypertension: Secondary | ICD-10-CM

## 2020-04-22 DIAGNOSIS — Z86010 Personal history of colonic polyps: Secondary | ICD-10-CM

## 2020-04-22 DIAGNOSIS — M79604 Pain in right leg: Secondary | ICD-10-CM | POA: Diagnosis not present

## 2020-04-22 DIAGNOSIS — M79605 Pain in left leg: Secondary | ICD-10-CM | POA: Diagnosis not present

## 2020-04-22 DIAGNOSIS — M79609 Pain in unspecified limb: Secondary | ICD-10-CM | POA: Insufficient documentation

## 2020-04-22 MED ORDER — NA SULFATE-K SULFATE-MG SULF 17.5-3.13-1.6 GM/177ML PO SOLN: 1.0000 | Freq: Once | ORAL | 0 refills | Status: AC

## 2020-04-22 NOTE — Progress Notes (Signed)
Patient ID: Judy Torres, female   DOB: [birth year], 74 y.o.   MRN: [id number]  Chief Complaint  Patient presents with  . New Patient (Initial Visit)    ref Cleda Mccreedy clearance for bunion surgery    HPI Judy Judy Torres is a 74 y.o. female.  I am asked to see the patient by Dr. Cleda Mccreedy for evaluation of her circulation for clearance prior to an upcoming bunion surgery.  We have seen her previously for port placement and removal.  She also has chronic kidney disease and other medical issues.  She has need for an upcoming surgery for a bunion on her foot.  Given her multiple medical issues and high risk for atherosclerotic peripheral arterial disease, her podiatrist asked if we could evaluate her perfusion prior to the surgery.  ABIs were done today which showed normal triphasic waveforms bilaterally with good digit pressures and no arterial insufficiency Judy either lower extremity.     Past Medical History:  Diagnosis Date  . Cancer [facility])    Stomach cancer  . Chronic kidney disease   . GERD (gastroesophageal reflux disease)   . HTN (hypertension)   . Hyperlipidemia   . Lupus ([place])   . S/P angioplasty with stent   . Wears dentures    full upper and lower    Past Surgical History:  Procedure Laterality Date  . CARDIAC CATHETERIZATION     stent  . CARDIAC SURGERY    . CATARACT EXTRACTION W/PHACO Right 11/15/2019   Procedure: CATARACT EXTRACTION PHACO AND INTRAOCULAR LENS PLACEMENT ([facility]) RIGHT  VISION BLUE 7.48 00:45.8;  Surgeon: Marchia Meiers, MD;  Location: [place];  Service: Ophthalmology;  Laterality: Right;  Port a cath  . CATARACT EXTRACTION W/PHACO Left 12/20/2019   Procedure: CATARACT EXTRACTION PHACO AND INTRAOCULAR LENS PLACEMENT ([place]) LEFT VISION BLUE;  Surgeon: Marchia Meiers, MD;  Location: [place];  Service: Ophthalmology;  Laterality: Left;  6.17 0:36.1  . CESAREAN SECTION    . COLONOSCOPY N/A 12/30/2017   Procedure: COLONOSCOPY;  Surgeon: Lin Landsman, MD;  [facility] ENDOSCOPY;  Service: Gastroenterology;  Laterality: N/A;  . CORONARY ARTERY BYPASS GRAFT  2003  . ESOPHAGOGASTRODUODENOSCOPY N/A 12/30/2017   Procedure: ESOPHAGOGASTRODUODENOSCOPY (EGD);  Surgeon: Lin Landsman, MD;  Location: [facility] ENDOSCOPY;  Service: Gastroenterology;  Laterality: N/A;  . ESOPHAGOGASTRODUODENOSCOPY Left 02/05/2018   Procedure: ESOPHAGOGASTRODUODENOSCOPY (EGD);  Surgeon: Virgel Manifold, MD;  Location[facility] ENDOSCOPY;  Service: Endoscopy;  Laterality: Left;  . ESOPHAGOGASTRODUODENOSCOPY (EGD) WITH PROPOFOL N/A 03/15/2018   Procedure: ESOPHAGOGASTRODUODENOSCOPY (EGD) WITH PROPOFOL;  Surgeon: Virgel Manifold, MD;  Location: [facility] ENDOSCOPY;  Service: Endoscopy;  Laterality: N/A;  . FLEXIBLE SIGMOIDOSCOPY N/A 12/31/2017   Procedure: FLEXIBLE SIGMOIDOSCOPY;  Surgeon: Jonathon Bellows, MD;  Location: [facility] ENDOSCOPY;  Service: Gastroenterology;  Laterality: N/A;  . PORTA CATH INSERTION N/A 04/19/2018   Procedure: PORTA CATH INSERTION;  Surgeon: Algernon Huxley, MD;  Location: [place] CV LAB;  Service: Cardiovascular;  Laterality: N/A;  . PORTA CATH REMOVAL N/A 02/28/2020   Procedure: PORTA CATH REMOVAL;  Surgeon: Algernon Huxley, MD;  Location: [place] CV LAB;  Service: Cardiovascular;  Laterality: N/A;     Family History  Problem Relation Age of Onset  . Hypertension Mother   . Stroke Mother   . Vascular Disease Mother   . Hypertension Sister      Social History   Tobacco Use  . Smoking status: Former Smoker    Packs/day: 0.50  Years: 20.00    Pack years: 10.00    Types: Cigarettes    Quit date: 03/07/2002    Years since quitting: 18.1  . Smokeless tobacco: Never Used  Vaping Use  . Vaping Use: Never used  Substance Use Topics  . Alcohol use: Not Currently  . Drug use: Never     Allergies  Allergen Reactions  . Penicillins Other (See Comments)    Pass out Has patient had a PCN reaction causing immediate rash,  facial/tongue/throat swelling, SOB or lightheadedness with hypotension: Yes Has patient had a PCN reaction causing severe rash involving mucus membranes or skin necrosis: No Has patient had a PCN reaction that required hospitalization: No Has patient had a PCN reaction occurring within the last 10 years: No If all of the above answers are "NO", then may proceed with Cephalosporin use.   . Sulfa Antibiotics Nausea Only  . Sulfasalazine Nausea And Vomiting       . Ace Inhibitors Nausea Only  . Losartan Anxiety  . Spironolactone Other (See Comments)    Felt poorly but couldn't clarify     Current Outpatient Medications  Medication Sig Dispense Refill  . acetaminophen (TYLENOL) 500 MG tablet Take 1,000 mg by mouth 2 (two) times daily as needed for moderate pain or headache.    . alendronate (FOSAMAX) 70 MG tablet Take 70 mg by mouth every Saturday.     Marland Kitchen allopurinol (ZYLOPRIM) 100 MG tablet Take 0.5 tablets (50 mg total) by mouth daily. 45 tablet 1  . amLODipine (NORVASC) 10 MG tablet Take 10 mg by mouth daily.    . Calcium Carb-Cholecalciferol (CALCIUM 600-D PO) Take 600 mg by mouth daily.    . carvedilol (COREG) 6.25 MG tablet Take 0.5 tablets (3.125 mg total) by mouth 2 (two) times daily. 90 tablet 3  . clotrimazole-betamethasone (LOTRISONE) cream Apply 1 application topically 2 (two) times daily as needed (rash).     Marland Kitchen esomeprazole (NEXIUM) 20 MG capsule Take 20 mg by mouth daily before breakfast.     . fluticasone (FLONASE) 50 MCG/ACT nasal spray Place 1 spray into both nostrils daily as needed for allergies or rhinitis.    . hydroxychloroquine (PLAQUENIL) 200 MG tablet Take 200 mg by mouth 2 (two) times daily.     Marland Kitchen lidocaine-prilocaine (EMLA) cream Apply to affected area once 30 g 3  . Multiple Vitamin (MULTIVITAMIN WITH MINERALS) TABS tablet Take 1 tablet by mouth daily. 180 tablet 0  . Na Sulfate-K Sulfate-Mg Sulf 17.5-3.13-1.6 GM/177ML SOLN Take 1 kit by mouth once for 1 dose.  354 mL 0  . ondansetron (ZOFRAN) 4 MG tablet Take 1 tablet (4 mg total) by mouth 2 (two) times daily as needed for refractory nausea / vomiting. Start on day 3 after cyclophosphamide chemotherapy. 30 tablet 3  . polyethylene glycol (MIRALAX / GLYCOLAX) packet Take 17 g by mouth daily.    . predniSONE (DELTASONE) 5 MG tablet Take 5 mg by mouth daily with breakfast.     . Pseudoephedrine-APAP 30-325 MG TABS Take 1 tablet by mouth daily as needed (sinuses).     . simvastatin (ZOCOR) 40 MG tablet Take 40 mg by mouth daily.     . vitamin B-12 (CYANOCOBALAMIN) 500 MCG tablet Take 500 mcg by mouth daily.     No current facility-administered medications for this visit.      REVIEW OF SYSTEMS (Negative unless checked)  Constitutional: _0 Weight loss  _1 Fever  _2 Chills Cardiac: _3 Chest pain   _4 Chest pressure   _5   Palpitations   _0 Shortness of breath when laying flat   _1 Shortness of breath at rest   _2 Shortness of breath with exertion. Vascular:  _3 Pain Judy legs with walking   _4 Pain Judy legs at rest   _5 Pain Judy legs when laying flat   _6 Claudication   _7 Pain Judy feet when walking  _8 Pain Judy feet at rest  _9 Pain Judy feet when laying flat   _10 History of DVT   _11 Phlebitis   _12 Swelling Judy legs   _13 Varicose veins   _14 Non-healing ulcers Pulmonary:   _15 Uses home oxygen   _16 Productive cough   _17 Hemoptysis   _18 Wheeze  _19 COPD   _20 Asthma Neurologic:  _21 Dizziness  _22 Blackouts   _23 Seizures   _24 History of stroke   _25 History of TIA  _26 Aphasia   _27 Temporary blindness   _28 Dysphagia   _29 Weakness or numbness Judy arms   _30 Weakness or numbness Judy legs Musculoskeletal:  _31 Arthritis   _32 Joint swelling   _33 Joint pain   _34 Low back pain Hematologic:  _35 Easy bruising  _36 Easy bleeding   _37 Hypercoagulable state   _38 Anemic  _39 Hepatitis Gastrointestinal:  _40 Blood Judy stool   _41 Vomiting blood  _42 Gastroesophageal reflux/heartburn   _43 Abdominal pain Genitourinary:  _44 Chronic kidney disease   _45 Difficult urination  _46 Frequent urination   _47 Burning with urination   _48 Hematuria Skin:  _49 Rashes   _50 Ulcers   _51 Wounds Psychological:  _52 History of anxiety   _53  History of major depression.    Physical Exam BP (!) 150/79 (BP Location: Right Arm)   Pulse 88   Resp 16   Wt 117 lb (53.1 kg)   BMI 23.63 kg/m  Gen:  WD/WN, NAD Head: [place]/AT, No temporalis wasting. Ear/Nose/Throat: Hearing grossly intact, nares w/o erythema or drainage, oropharynx w/o Erythema/Exudate Eyes: Conjunctiva clear, sclera non-icteric  Neck: trachea midline.  No JVD.  Pulmonary:  Good air movement, respirations not labored, no use of accessory muscles  Cardiac: RRR, no JVD Vascular:  Vessel Right Left  Radial Palpable Palpable                          DP  2+  2+  PT  1+  1+   Gastrointestinal:. No masses, surgical incisions, or scars. Musculoskeletal: M/S 5/5 throughout.  Extremities without ischemic changes.  No deformity or atrophy.  Trace bilateral lower extremity edema. Neurologic: Sensation grossly intact Judy extremities.  Symmetrical.  Speech is fluent. Motor exam as listed above. Psychiatric: Judgment intact, Mood & affect appropriate for pt's clinical situation. Dermatologic: No rashes or ulcers noted.  No cellulitis or open wounds.    Radiology No results found.  Labs Recent Results (from the past 2160 hour(s))  Comprehensive metabolic panel     Status: Abnormal   Collection Time: 02/07/20  1:10 PM  Result Value Ref Range   Sodium 138 135 - 145 mmol/L   Potassium 3.6 3.5 - 5.1 mmol/L   Chloride 107 98 - 111 mmol/L   CO2 22 22 - 32 mmol/L   Glucose, Bld 91 70 - 99 mg/dL    Comment: Glucose reference range applies only to samples taken after fasting for at least 8 hours.   BUN 20 8 - 23 mg/dL   Creatinine, Ser 1.04 (H) 0.44 - 1.00 mg/dL   Calcium 9.0 8.9 - 10.3 mg/dL   Total Protein 6.3 (L) 6.5 - 8.1 g/dL   Albumin 4.0 3.5 - 5.0 g/dL   AST 22 15 - 41 U/L   ALT 13 0 - 44 U/L  Alkaline Phosphatase 58 38 - 126 U/L   Total  Bilirubin 0.5 0.3 - 1.2 mg/dL   GFR calc non Af Amer 54 (L) >60 mL/min   GFR calc Af Amer >60 >60 mL/min   Anion gap 9 5 - 15    Comment: Performed at [facility], [address], [place] [address]  CBC with Differential/Platelet     Status: Abnormal   Collection Time: 02/07/20  1:10 PM  Result Value Ref Range   WBC 6.8 4.0 - 10.5 K/uL   RBC 3.70 (L) 3.87 - 5.11 MIL/uL   Hemoglobin 11.9 (L) 12.0 - 15.0 g/dL   HCT 37.0 36 - 46 %   MCV 100.0 80.0 - 100.0 fL   MCH 32.2 26.0 - 34.0 pg   MCHC 32.2 30.0 - 36.0 g/dL   RDW 13.1 11.5 - 15.5 %   Platelets 181 150 - 400 K/uL   nRBC 0.0 0.0 - 0.2 %   Neutrophils Relative % 67 %   Neutro Abs 4.5 1.7 - 7.7 K/uL   Lymphocytes Relative 20 %   Lymphs Abs 1.4 0.7 - 4.0 K/uL   Monocytes Relative 10 %   Monocytes Absolute 0.7 0 - 1 K/uL   Eosinophils Relative 3 %   Eosinophils Absolute 0.2 0 - 0 K/uL   Basophils Relative 0 %   Basophils Absolute 0.0 0 - 0 K/uL   Immature Granulocytes 0 %   Abs Immature Granulocytes 0.02 0.00 - 0.07 K/uL    Comment: Performed at [facility], [address], [place], [place] [address]  Uric acid     Status: None   Collection Time: 02/07/20  1:10 PM  Result Value Ref Range   Uric Acid, Serum 5.9 2.5 - 7.1 mg/dL    Comment: Performed at [facility], [address], [place], [place] [address]  Lactate dehydrogenase     Status: Abnormal   Collection Time: 02/07/20  1:10 PM  Result Value Ref Range   LDH 213 (H) 98 - 192 U/L    Comment: Performed at [facility], [address], [place], [place] [address]  SARS CORONAVIRUS 2 (TAT 6-24 HRS) Nasopharyngeal Nasopharyngeal Swab     Status: None   Collection Time: 02/26/20 11:28 AM   Specimen: Nasopharyngeal Swab  Result Value Ref Range   SARS Coronavirus 2 NEGATIVE NEGATIVE    Comment: (NOTE) SARS-CoV-2 target nucleic acids are NOT DETECTED.  The SARS-CoV-2 RNA is generally detectable Judy upper and  lower respiratory specimens during the acute phase of infection. Negative results do not preclude SARS-CoV-2 infection, do not rule out co-infections with other pathogens, and should not be used as the sole basis for treatment or other patient management decisions. Negative results must be combined with clinical observations, patient history, and epidemiological information. The expected result is Negative.  Fact Sheet for Patients: SugarRoll.be  Fact Sheet for Healthcare Providers: https://www.woods-mathews.com/  This test is not yet approved or cleared by the [place] FDA and  has been authorized for detection and/or diagnosis of SARS-CoV-2 by FDA under an Emergency Use Authorization (EUA). This EUA will remain  Judy effect (meaning this test can be used) for the duration of the COVID-19 declaration under Se ction 564(b)(1) of the Act, 21 U.S.C. section 360bbb-3(b)(1), unless the authorization is terminated or revoked sooner.  Performed at [facility], [place] [address], [place], [place] [address]   NM Myocar Multi W/Spect Tamela Oddi  Motion / EF     Status: None   Collection Time: 03/18/20 12:03 PM  Result Value Ref Range   Rest HR 77 bpm   Rest BP 158/74 mmHg   Exercise duration (sec) 0 sec   Percent HR 67 %   Exercise duration (min) 0 min   Estimated workload 1.0 METS   Peak HR 100 bpm   Peak BP 130/64 mmHg   MPHR 148 bpm   SSS 8    SRS 0    SDS 7    TID 1.11    LV sys vol 32 mL   LV dias vol 76 46 - 106 mL    Assessment/Plan:  Chronic kidney disease, stage IV (severe) (HCC) Avoid contrast.  Noninvasive studies to evaluate perfusion are appropriate Judy her case.  Hypertension blood pressure control important Judy reducing the progression of atherosclerotic disease. On appropriate oral medications.   Port-A-Cath Judy place Has now been removed  Pain Judy limb ABIs were done today which showed normal triphasic waveforms  bilaterally with good digit pressures and no arterial insufficiency Judy either lower extremity.  She has adequate flow for wound healing and no vascular surgery or intervention is required at this time.  Return to clinic as needed.      Leotis Pain 04/22/2020, 3:27 PM   This note was created with Dragon medical transcription system.  Any errors from dictation are unintentional.

## 2020-04-22 NOTE — Assessment & Plan Note (Signed)
Has now been removed

## 2020-04-22 NOTE — Assessment & Plan Note (Signed)
ABIs were done today which showed normal triphasic waveforms bilaterally with good digit pressures and no arterial insufficiency in either lower extremity.  She has adequate flow for wound healing and no vascular surgery or intervention is required at this time.  Return to clinic as needed.

## 2020-04-22 NOTE — Assessment & Plan Note (Signed)
blood pressure control important in reducing the progression of atherosclerotic disease. On appropriate oral medications.  

## 2020-04-22 NOTE — Progress Notes (Signed)
Gastroenterology Pre-Procedure Review  Request Date: Friday 05/23/20 Requesting Physician: Dr. Bonna Gains  PATIENT REVIEW QUESTIONS: The patient responded to the following health history questions as indicated:    1. Are you having any GI issues? no 2. Do you have a personal history of Polyps? no 3. Do you have a family history of Colon Cancer or Polyps? no 4. Diabetes Mellitus? no 5. Joint replacements in the past 12 months?no 6. Major health problems in the past 3 months?no 7. Any artificial heart valves, MVP, or defibrillator?no    MEDICATIONS & ALLERGIES:    Patient reports the following regarding taking any anticoagulation/antiplatelet therapy:   Plavix, Coumadin, Eliquis, Xarelto, Lovenox, Pradaxa, Brilinta, or Effient? no Aspirin? no  Patient confirms/reports the following medications:  Current Outpatient Medications  Medication Sig Dispense Refill  . acetaminophen (TYLENOL) 500 MG tablet Take 1,000 mg by mouth 2 (two) times daily as needed for moderate pain or headache.    . alendronate (FOSAMAX) 70 MG tablet Take 70 mg by mouth every Saturday.     Marland Kitchen allopurinol (ZYLOPRIM) 100 MG tablet Take 0.5 tablets (50 mg total) by mouth daily. 45 tablet 1  . amLODipine (NORVASC) 10 MG tablet Take 10 mg by mouth daily.    . Calcium Carb-Cholecalciferol (CALCIUM 600-D PO) Take 600 mg by mouth daily.    . carvedilol (COREG) 6.25 MG tablet Take 0.5 tablets (3.125 mg total) by mouth 2 (two) times daily. 90 tablet 3  . clotrimazole-betamethasone (LOTRISONE) cream Apply 1 application topically 2 (two) times daily as needed (rash).     Marland Kitchen esomeprazole (NEXIUM) 20 MG capsule Take 20 mg by mouth daily before breakfast.     . fluticasone (FLONASE) 50 MCG/ACT nasal spray Place 1 spray into both nostrils daily as needed for allergies or rhinitis.    . hydroxychloroquine (PLAQUENIL) 200 MG tablet Take 200 mg by mouth 2 (two) times daily.     Marland Kitchen lidocaine-prilocaine (EMLA) cream Apply to affected area  once 30 g 3  . Multiple Vitamin (MULTIVITAMIN WITH MINERALS) TABS tablet Take 1 tablet by mouth daily. 180 tablet 0  . ondansetron (ZOFRAN) 4 MG tablet Take 1 tablet (4 mg total) by mouth 2 (two) times daily as needed for refractory nausea / vomiting. Start on day 3 after cyclophosphamide chemotherapy. 30 tablet 3  . polyethylene glycol (MIRALAX / GLYCOLAX) packet Take 17 g by mouth daily.    . predniSONE (DELTASONE) 5 MG tablet Take 5 mg by mouth daily with breakfast.     . Pseudoephedrine-APAP 30-325 MG TABS Take 1 tablet by mouth daily as needed (sinuses).     . simvastatin (ZOCOR) 40 MG tablet Take 40 mg by mouth daily.     . vitamin B-12 (CYANOCOBALAMIN) 500 MCG tablet Take 500 mcg by mouth daily.     No current facility-administered medications for this visit.    Patient confirms/reports the following allergies:  Allergies  Allergen Reactions  . Penicillins Other (See Comments)    Pass out Has patient had a PCN reaction causing immediate rash, facial/tongue/throat swelling, SOB or lightheadedness with hypotension: Yes Has patient had a PCN reaction causing severe rash involving mucus membranes or skin necrosis: No Has patient had a PCN reaction that required hospitalization: No Has patient had a PCN reaction occurring within the last 10 years: No If all of the above answers are "NO", then may proceed with Cephalosporin use.   . Sulfa Antibiotics Nausea Only  . Sulfasalazine Nausea And Vomiting       .  Ace Inhibitors Nausea Only  . Losartan Anxiety  . Spironolactone Other (See Comments)    Felt poorly but couldn't clarify     No orders of the defined types were placed in this encounter.   AUTHORIZATION INFORMATION Primary Insurance: 1D#: Group #:  Secondary Insurance: 1D#: Group #:  SCHEDULE INFORMATION: Date: Friday 05/23/20 Time: Location:[facility]

## 2020-04-22 NOTE — Assessment & Plan Note (Signed)
Avoid contrast.  Noninvasive studies to evaluate perfusion are appropriate in her case.

## 2020-05-14 ENCOUNTER — Ambulatory Visit: Payer: Medicare HMO | Admitting: Physician Assistant

## 2020-05-16 DIAGNOSIS — M5412 Radiculopathy, cervical region: Secondary | ICD-10-CM | POA: Diagnosis not present

## 2020-05-16 DIAGNOSIS — M5030 Other cervical disc degeneration, unspecified cervical region: Secondary | ICD-10-CM | POA: Diagnosis not present

## 2020-05-16 DIAGNOSIS — M546 Pain in thoracic spine: Secondary | ICD-10-CM | POA: Diagnosis not present

## 2020-05-16 DIAGNOSIS — M5136 Other intervertebral disc degeneration, lumbar region: Secondary | ICD-10-CM | POA: Diagnosis not present

## 2020-05-16 DIAGNOSIS — S3992XA Unspecified injury of lower back, initial encounter: Secondary | ICD-10-CM | POA: Diagnosis not present

## 2020-05-16 DIAGNOSIS — M545 Low back pain, unspecified: Secondary | ICD-10-CM | POA: Diagnosis not present

## 2020-05-16 DIAGNOSIS — M62838 Other muscle spasm: Secondary | ICD-10-CM | POA: Diagnosis not present

## 2020-05-16 DIAGNOSIS — M5441 Lumbago with sciatica, right side: Secondary | ICD-10-CM | POA: Diagnosis not present

## 2020-05-19 DIAGNOSIS — K635 Polyp of colon: Secondary | ICD-10-CM

## 2020-05-19 HISTORY — DX: Polyp of colon: K63.5

## 2020-05-21 ENCOUNTER — Other Ambulatory Visit: Payer: Self-pay

## 2020-05-21 ENCOUNTER — Other Ambulatory Visit
Admission: RE | Admit: 2020-05-21 | Discharge: 2020-05-21 | Disposition: A | Discharge status: Home / Self Care | Payer: Medicare HMO | Source: Ambulatory Visit | Attending: Gastroenterology | Admitting: Gastroenterology

## 2020-05-21 DIAGNOSIS — Z20822 Contact with and (suspected) exposure to covid-19: Secondary | ICD-10-CM | POA: Diagnosis not present

## 2020-05-21 DIAGNOSIS — Z01812 Encounter for preprocedural laboratory examination: Secondary | ICD-10-CM | POA: Diagnosis not present

## 2020-05-21 LAB — SARS CORONAVIRUS 2 (TAT 6-24 HRS): SARS Coronavirus 2: NEGATIVE

## 2020-05-23 ENCOUNTER — Ambulatory Visit
Admission: RE | Admit: 2020-05-23 | Discharge: 2020-05-23 | Disposition: A | Discharge status: Home / Self Care | Payer: Medicare HMO | Attending: Gastroenterology | Admitting: Gastroenterology

## 2020-05-23 ENCOUNTER — Ambulatory Visit: Payer: Medicare HMO | Admitting: Anesthesiology

## 2020-05-23 ENCOUNTER — Encounter: Payer: Self-pay | Admitting: Gastroenterology

## 2020-05-23 ENCOUNTER — Encounter
Admission: RE | Disposition: A | Discharge status: Home / Self Care | Payer: Self-pay | Source: Home / Self Care | Attending: Gastroenterology

## 2020-05-23 ENCOUNTER — Other Ambulatory Visit: Payer: Self-pay

## 2020-05-23 DIAGNOSIS — K5790 Diverticulosis of intestine, part unspecified, without perforation or abscess without bleeding: Secondary | ICD-10-CM | POA: Diagnosis not present

## 2020-05-23 DIAGNOSIS — Z888 Allergy status to other drugs, medicaments and biological substances status: Secondary | ICD-10-CM | POA: Diagnosis not present

## 2020-05-23 DIAGNOSIS — Z1211 Encounter for screening for malignant neoplasm of colon: Secondary | ICD-10-CM | POA: Diagnosis not present

## 2020-05-23 DIAGNOSIS — Z880 Allergy status to penicillin: Secondary | ICD-10-CM | POA: Insufficient documentation

## 2020-05-23 DIAGNOSIS — Z87891 Personal history of nicotine dependence: Secondary | ICD-10-CM | POA: Insufficient documentation

## 2020-05-23 DIAGNOSIS — Z7983 Long term (current) use of bisphosphonates: Secondary | ICD-10-CM | POA: Diagnosis not present

## 2020-05-23 DIAGNOSIS — D122 Benign neoplasm of ascending colon: Secondary | ICD-10-CM | POA: Diagnosis not present

## 2020-05-23 DIAGNOSIS — Z79899 Other long term (current) drug therapy: Secondary | ICD-10-CM | POA: Diagnosis not present

## 2020-05-23 DIAGNOSIS — Z882 Allergy status to sulfonamides status: Secondary | ICD-10-CM | POA: Diagnosis not present

## 2020-05-23 DIAGNOSIS — K635 Polyp of colon: Secondary | ICD-10-CM | POA: Diagnosis not present

## 2020-05-23 DIAGNOSIS — I110 Hypertensive heart disease with heart failure: Secondary | ICD-10-CM | POA: Diagnosis not present

## 2020-05-23 DIAGNOSIS — I509 Heart failure, unspecified: Secondary | ICD-10-CM | POA: Diagnosis not present

## 2020-05-23 DIAGNOSIS — K5730 Diverticulosis of large intestine without perforation or abscess without bleeding: Secondary | ICD-10-CM | POA: Diagnosis not present

## 2020-05-23 DIAGNOSIS — Z85028 Personal history of other malignant neoplasm of stomach: Secondary | ICD-10-CM | POA: Diagnosis not present

## 2020-05-23 DIAGNOSIS — Z860100 Personal history of colon polyps, unspecified: Secondary | ICD-10-CM

## 2020-05-23 DIAGNOSIS — J439 Emphysema, unspecified: Secondary | ICD-10-CM | POA: Diagnosis not present

## 2020-05-23 DIAGNOSIS — N189 Chronic kidney disease, unspecified: Secondary | ICD-10-CM | POA: Diagnosis not present

## 2020-05-23 DIAGNOSIS — D126 Benign neoplasm of colon, unspecified: Secondary | ICD-10-CM | POA: Diagnosis not present

## 2020-05-23 HISTORY — PX: COLONOSCOPY WITH PROPOFOL: SHX5780

## 2020-05-23 SURGERY — COLONOSCOPY WITH PROPOFOL
Anesthesia: General

## 2020-05-23 MED ORDER — SODIUM CHLORIDE 0.9 % IV SOLN: INTRAVENOUS | Status: DC

## 2020-05-23 MED ORDER — MIDAZOLAM HCL 2 MG/2ML IJ SOLN
INTRAMUSCULAR | Status: AC
  Filled 2020-05-23: qty 2

## 2020-05-23 MED ORDER — LIDOCAINE HCL (PF) 2 % IJ SOLN
INTRAMUSCULAR | Status: DC | PRN
  Administered 2020-05-23: 40 mg

## 2020-05-23 MED ORDER — PROPOFOL 500 MG/50ML IV EMUL
INTRAVENOUS | Status: AC
  Filled 2020-05-23: qty 50

## 2020-05-23 MED ORDER — LIDOCAINE HCL (PF) 2 % IJ SOLN
INTRAMUSCULAR | Status: AC
  Filled 2020-05-23: qty 5

## 2020-05-23 MED ORDER — PROPOFOL 10 MG/ML IV BOLUS
INTRAVENOUS | Status: DC | PRN
  Administered 2020-05-23 (×3): 20 mg via INTRAVENOUS
  Administered 2020-05-23 (×3): 10 mg via INTRAVENOUS

## 2020-05-23 MED ORDER — FENTANYL CITRATE (PF) 100 MCG/2ML IJ SOLN
INTRAMUSCULAR | Status: AC
  Filled 2020-05-23: qty 2

## 2020-05-23 MED ORDER — PROPOFOL 500 MG/50ML IV EMUL
INTRAVENOUS | Status: DC | PRN
  Administered 2020-05-23: 25 ug/kg/min via INTRAVENOUS

## 2020-05-23 NOTE — H&P (Signed)
[name], MD [address] [place], [place], [place], [address] [address], [place], [place], [place], [address] Phone: [phone]  Fax: [phone]  Primary Care Physician:  [name], MD   Pre-Procedure History & Physical: HPI:  In Fortune Torosian is a 74 y.o. female is here for a colonoscopy.   Past Medical History:  Diagnosis Date  . Cancer [facility])    Stomach cancer  . Chronic kidney disease   . GERD (gastroesophageal reflux disease)   . HTN (hypertension)   . Hyperlipidemia   . Lupus ([place])   . S/P angioplasty with stent   . Wears dentures    full upper and lower    Past Surgical History:  Procedure Laterality Date  . CARDIAC CATHETERIZATION     stent  . CARDIAC SURGERY    . CATARACT EXTRACTION W/PHACO Right 11/15/2019   Procedure: CATARACT EXTRACTION PHACO AND INTRAOCULAR LENS PLACEMENT ([facility]) RIGHT  VISION BLUE 7.48 00:45.8;  Surgeon: Marchia Meiers, MD;  Location: [place];  Service: Ophthalmology;  Laterality: Right;  Port a cath  . CATARACT EXTRACTION W/PHACO Left 12/20/2019   Procedure: CATARACT EXTRACTION PHACO AND INTRAOCULAR LENS PLACEMENT ([place]) LEFT VISION BLUE;  Surgeon: Marchia Meiers, MD;  Location: [place];  Service: Ophthalmology;  Laterality: Left;  6.17 0:36.1  . CESAREAN SECTION    . COLONOSCOPY N/A 12/30/2017   Procedure: COLONOSCOPY;  Surgeon: Lin Landsman, MD;  Location[facility] ENDOSCOPY;  Service: Gastroenterology;  Laterality: N/A;  . CORONARY ARTERY BYPASS GRAFT  2003  . ESOPHAGOGASTRODUODENOSCOPY N/A 12/30/2017   Procedure: ESOPHAGOGASTRODUODENOSCOPY (EGD);  Surgeon: Lin Landsman, MD;  Location: [facility] ENDOSCOPY;  Service: Gastroenterology;  Laterality: N/A;  . ESOPHAGOGASTRODUODENOSCOPY Left 02/05/2018   Procedure: ESOPHAGOGASTRODUODENOSCOPY (EGD);  Surgeon: Virgel Manifold, MD;  Location: [facility] ENDOSCOPY;  Service: Endoscopy;  Laterality: Left;  . ESOPHAGOGASTRODUODENOSCOPY (EGD) WITH PROPOFOL  N/A 03/15/2018   Procedure: ESOPHAGOGASTRODUODENOSCOPY (EGD) WITH PROPOFOL;  Surgeon: Virgel Manifold, MD;  Location: [facility] ENDOSCOPY;  Service: Endoscopy;  Laterality: N/A;  . FLEXIBLE SIGMOIDOSCOPY N/A 12/31/2017   Procedure: FLEXIBLE SIGMOIDOSCOPY;  Surgeon: Jonathon Bellows, MD;  Location: [facility] ENDOSCOPY;  Service: Gastroenterology;  Laterality: N/A;  . PORTA CATH INSERTION N/A 04/19/2018   Procedure: PORTA CATH INSERTION;  Surgeon: Algernon Huxley, MD;  Location: [place] CV LAB;  Service: Cardiovascular;  Laterality: N/A;  . PORTA CATH REMOVAL N/A 02/28/2020   Procedure: PORTA CATH REMOVAL;  Surgeon: Algernon Huxley, MD;  Location: [place] CV LAB;  Service: Cardiovascular;  Laterality: N/A;    Prior to Admission medications   Medication Sig Start Date End Date Taking? Authorizing Provider  acetaminophen (TYLENOL) 500 MG tablet Take 1,000 mg by mouth 2 (two) times daily as needed for moderate pain or headache.    [provider]  alendronate (FOSAMAX) 70 MG tablet Take 70 mg by mouth every Saturday.     [provider]  allopurinol (ZYLOPRIM) 100 MG tablet Take 0.5 tablets (50 mg total) by mouth daily. 09/17/19   Lequita Asal, MD  amLODipine (NORVASC) 10 MG tablet Take 10 mg by mouth daily. 08/15/18   [provider]  Calcium Carb-Cholecalciferol (CALCIUM 600-D PO) Take 600 mg by mouth daily.    [provider]  carvedilol (COREG) 6.25 MG tablet Take 0.5 tablets (3.125 mg total) by mouth 2 (two) times daily. 02/22/20   Loel Dubonnet, NP  clotrimazole-betamethasone (LOTRISONE) cream Apply 1 application topically 2 (two) times daily as needed (  rash).  08/24/16   [provider]  esomeprazole (NEXIUM) 20 MG capsule Take 20 mg by mouth daily before breakfast.     [provider]  fluticasone (FLONASE) 50 MCG/ACT nasal spray Place 1 spray into both nostrils daily as needed for allergies or rhinitis.    [provider]    hydroxychloroquine (PLAQUENIL) 200 MG tablet Take 200 mg by mouth 2 (two) times daily.  01/22/14   [provider]  lidocaine-prilocaine (EMLA) cream Apply to affected area once 04/24/18   Lequita Asal, MD  Multiple Vitamin (MULTIVITAMIN WITH MINERALS) TABS tablet Take 1 tablet by mouth daily. 01/03/18   Salary, Avel Peace, MD  ondansetron (ZOFRAN) 4 MG tablet Take 1 tablet (4 mg total) by mouth 2 (two) times daily as needed for refractory nausea / vomiting. Start on day 3 after cyclophosphamide chemotherapy. 07/24/18   Karen Kitchens, NP  polyethylene glycol (MIRALAX / GLYCOLAX) packet Take 17 g by mouth daily.    [provider]  predniSONE (DELTASONE) 5 MG tablet Take 5 mg by mouth daily with breakfast.  12/12/13   [provider]  Pseudoephedrine-APAP 30-325 MG TABS Take 1 tablet by mouth daily as needed (sinuses).     [provider]  simvastatin (ZOCOR) 40 MG tablet Take 40 mg by mouth daily.  08/15/14   [provider]  vitamin B-12 (CYANOCOBALAMIN) 500 MCG tablet Take 500 mcg by mouth daily.    [provider]    Allergies as of 04/22/2020 - Review Complete 04/22/2020  Allergen Reaction Noted  . Penicillins Other (See Comments) 02/28/2014  . Sulfa antibiotics Nausea Only 02/28/2014  . Sulfasalazine Nausea And Vomiting 03/07/2013  . Ace inhibitors Nausea Only 03/07/2013  . Losartan Anxiety 03/07/2013  . Spironolactone Other (See Comments) 04/25/2013    Family History  Problem Relation Age of Onset  . Hypertension Mother   . Stroke Mother   . Vascular Disease Mother   . Hypertension Sister     Social History   Socioeconomic History  . Marital status: Married    Spouse name: Not on file  . Number of children: Not on file  . Years of education: Not on file  . Highest education level: Not on file  Occupational History  . Not on file  Tobacco Use  . Smoking status: Former Smoker    Packs/day: 0.50    Years: 20.00     Pack years: 10.00    Types: Cigarettes    Quit date: 03/07/2002    Years since quitting: 18.2  . Smokeless tobacco: Never Used  Vaping Use  . Vaping Use: Never used  Substance and Sexual Activity  . Alcohol use: Not Currently  . Drug use: Never  . Sexual activity: Not on file  Other Topics Concern  . Not on file  Social History Narrative  . Not on file   Social Determinants of Health   Financial Resource Strain:   . Difficulty of Paying Living Expenses: Not on file  Food Insecurity:   . Worried About Charity fundraiser in the Last Year: Not on file  . Ran Out of Food in the Last Year: Not on file  Transportation Needs:   . Lack of Transportation (Medical): Not on file  . Lack of Transportation (Non-Medical): Not on file  Physical Activity:   . Days of Exercise per Week: Not on file  . Minutes of Exercise per Session: Not on file  Stress:   .  Feeling of Stress : Not on file  Social Connections:   . Frequency of Communication with Friends and Family: Not on file  . Frequency of Social Gatherings with Friends and Family: Not on file  . Attends Religious Services: Not on file  . Active Member of Clubs or Organizations: Not on file  . Attends Archivist Meetings: Not on file  . Marital Status: Not on file  Intimate Partner Violence:   . Fear of Current or Ex-Partner: Not on file  . Emotionally Abused: Not on file  . Physically Abused: Not on file  . Sexually Abused: Not on file    Review of Systems: See HPI, otherwise negative ROS  Physical Exam: There were no vitals taken for this visit. General:   Alert,  pleasant and cooperative in NAD Head:  Normocephalic and atraumatic. Neck:  Supple; no masses or thyromegaly. Lungs:  Clear throughout to auscultation, normal respiratory effort.    Heart:  +S1, +S2, Regular rate and rhythm, No edema. Abdomen:  Soft, nontender and nondistended. Normal bowel sounds, without guarding, and without rebound.   Neurologic:   Alert and  oriented x4;  grossly normal neurologically.  Impression/Plan: In [place] Ruble is here for a colonoscopy to be performed for adenoma polyps in 2019.  Risks, benefits, limitations, and alternatives regarding  colonoscopy have been reviewed with the patient.  Questions have been answered.  All parties agreeable.   Virgel Manifold, MD  05/23/2020, 9:40 AM

## 2020-05-23 NOTE — Op Note (Signed)
[facility] Gastroenterology Patient Name: Judy Torres Procedure Date: 05/23/2020 10:35 AM MRN: [id number] Account #: [id number] Date of Birth: 1945-07-28 Admit Type: Outpatient Age: 74 Room: [facility] ENDO ROOM 2 Gender: Female Note Status: Finalized Procedure:             Colonoscopy Indications:           High risk colon cancer surveillance: Personal history                         of colonic polyps Providers:             Keiana Tavella B. Bonna Gains MD, MD Referring MD:          [name], MD (Referring MD) Medicines:             Monitored Anesthesia Care Complications:         No immediate complications. Procedure:             Pre-Anesthesia Assessment:                        - ASA Grade Assessment: II - A patient with mild                         systemic disease.                        - Prior to the procedure, a History and Physical was                         performed, and patient medications, allergies and                         sensitivities were reviewed. The patient's tolerance                         of previous anesthesia was reviewed.                        - The risks and benefits of the procedure and the                         sedation options and risks were discussed with the                         patient. All questions were answered and informed                         consent was obtained.                        - Patient identification and proposed procedure were                         verified prior to the procedure by the physician, the                         nurse, the anesthesiologist, the anesthetist and the                         technician. The  procedure was verified Judy the                         procedure room.                        After obtaining informed consent, the colonoscope was                         passed under direct vision. Throughout the procedure,                         the patient's blood pressure, pulse, and  oxygen                         saturations were monitored continuously. The                         Colonoscope was introduced through the anus and                         advanced to the the cecum, identified by appendiceal                         orifice and ileocecal valve. The colonoscopy was                         performed with ease. The patient tolerated the                         procedure well. The quality of the bowel preparation                         was good. Findings:      The perianal and digital rectal examinations were normal.      Two sessile polyps were found Judy the transverse colon and ascending       colon. The polyps were 3 to 4 mm Judy size. These polyps were removed with       a jumbo cold forceps. Resection and retrieval were complete.      Three sessile polyps were found Judy the transverse colon. The polyps were       6 to 7 mm Judy size. These polyps were removed with a cold snare.       Resection and retrieval were complete.      A post polypectomy scar was found Judy the recto-sigmoid colon. There was       residual polyp tissue. The polyp was removed with a cold snare.       Resection and retrieval were complete.      Multiple diverticula were found Judy the sigmoid colon.      The exam was otherwise without abnormality.      The rectum, sigmoid colon, descending colon, transverse colon, ascending       colon and cecum appeared normal.      The retroflexed view of the distal rectum and anal verge was normal and       showed no anal or rectal abnormalities. Impression:            - Two 3 to 4 mm polyps Judy the transverse colon and Judy  the ascending colon, removed with a jumbo cold                         forceps. Resected and retrieved.                        - Three 6 to 7 mm polyps Judy the transverse colon,                         removed with a cold snare. Resected and retrieved.                        - Post-polypectomy scar Judy the  recto-sigmoid colon.                        - Diverticulosis Judy the sigmoid colon.                        - The examination was otherwise normal.                        - The rectum, sigmoid colon, descending colon,                         transverse colon, ascending colon and cecum are normal.                        - The distal rectum and anal verge are normal on                         retroflexion view. Recommendation:        - Discharge patient to home (with escort).                        - High fiber diet.                        - Advance diet as tolerated.                        - Continue present medications.                        - Await pathology results.                        - Repeat colonoscopy Judy 3 years.                        - The findings and recommendations were discussed with                         the patient.                        - The findings and recommendations were discussed with                         the patient's family.                        -  Return to primary care physician as previously                         scheduled. Procedure Code(s):     --- Professional ---                        [phone], Colonoscopy, flexible; with removal of                         tumor(s), polyp(s), or other lesion(s) by snare                         technique                        45380, 38, Colonoscopy, flexible; with biopsy, single                         or multiple Diagnosis Code(s):     --- Professional ---                        Z86.010, Personal history of colonic polyps                        K63.5, Polyp of colon CPT copyright 2019 American Medical Association. All rights reserved. The codes documented Judy this report are preliminary and upon coder review may  be revised to meet current compliance requirements.  [name], MD Margretta Sidle B. Bonna Gains MD, MD 05/23/2020 11:30:30 AM This report has been signed electronically. Number of Addenda: 0 Note  Initiated On: 05/23/2020 10:35 AM Scope Withdrawal Time: 0 hours 18 minutes 43 seconds  Total Procedure Duration: 0 hours 32 minutes 36 seconds  Estimated Blood Loss:  Estimated blood loss: none.      [facility]

## 2020-05-23 NOTE — Anesthesia Preprocedure Evaluation (Signed)
Anesthesia Evaluation  Patient identified by MRN, date of birth, ID band Patient awake    Reviewed: Allergy & Precautions, NPO status , Patient's Chart, lab work & pertinent test results  History of Anesthesia Complications Negative for: history of anesthetic complications  Airway Mallampati: II  TM Distance: >3 FB Neck ROM: Full    Dental  (+) Upper Dentures, Lower Dentures   Pulmonary COPD, former smoker,    breath sounds clear to auscultation- rhonchi (-) wheezing      Cardiovascular hypertension, + CAD, + CABG and +CHF   Rhythm:Regular Rate:Normal - Systolic murmurs and - Diastolic murmurs    Neuro/Psych neg Seizures negative neurological ROS  negative psych ROS   GI/Hepatic Neg liver ROS, PUD, GERD  ,  Endo/Other  negative endocrine ROSneg diabetes  Renal/GU negative Renal ROS     Musculoskeletal negative musculoskeletal ROS (+)   Abdominal (+) - obese,   Peds  Hematology  (+) anemia ,   Anesthesia Other Findings Past Medical History: No date: Cancer ([place])     Comment:  Stomach cancer No date: Chronic kidney disease No date: GERD (gastroesophageal reflux disease) No date: HTN (hypertension) No date: Hyperlipidemia No date: Lupus ([place]) No date: S/P angioplasty with stent No date: Wears dentures     Comment:  full upper and lower   Reproductive/Obstetrics                             Anesthesia Physical Anesthesia Plan  ASA: III  Anesthesia Plan: General   Post-op Pain Management:    Induction: Intravenous  PONV Risk Score and Plan: 2 and Propofol infusion  Airway Management Planned: Natural Airway  Additional Equipment:   Intra-op Plan:   Post-operative Plan:   Informed Consent: I have reviewed the patients History and Physical, chart, labs and discussed the procedure including the risks, benefits and alternatives for the proposed anesthesia with the patient or  authorized representative who has indicated his/her understanding and acceptance.     Dental advisory given  Plan Discussed with: CRNA and Anesthesiologist  Anesthesia Plan Comments:         Anesthesia Quick Evaluation

## 2020-05-23 NOTE — Transfer of Care (Signed)
Immediate Anesthesia Transfer of Care Note  Patient: Judy Torres  Procedure(s) Performed: COLONOSCOPY WITH PROPOFOL (N/A )  Patient Location: PACU  Anesthesia Type:General  Level of Consciousness: sedated  Airway & Oxygen Therapy: Patient Spontanous Breathing  Post-op Assessment: Report given to RN and Post -op Vital signs reviewed and stable  Post vital signs: Reviewed and stable  Last Vitals:  Vitals Value Taken Time  BP 99/55 05/23/20 1128  Temp    Pulse 70 05/23/20 1128  Resp 15 05/23/20 1128  SpO2 99 % 05/23/20 1128    Last Pain:  Vitals:   05/23/20 1128  TempSrc:   PainSc: 0-No pain         Complications: No complications documented.

## 2020-05-23 NOTE — Anesthesia Postprocedure Evaluation (Signed)
Anesthesia Post Note  Patient: Judy Torres  Procedure(s) Performed: COLONOSCOPY WITH PROPOFOL (N/A )  Patient location during evaluation: Endoscopy Anesthesia Type: General Level of consciousness: awake and alert and oriented Pain management: pain level controlled Vital Signs Assessment: post-procedure vital signs reviewed and stable Respiratory status: spontaneous breathing, nonlabored ventilation and respiratory function stable Cardiovascular status: blood pressure returned to baseline and stable Postop Assessment: no signs of nausea or vomiting Anesthetic complications: no   No complications documented.   Last Vitals:  Vitals:   05/23/20 1128 05/23/20 1138  BP: (!) 99/55 121/66  Pulse: 70   Resp: 15   Temp:    SpO2: 99%     Last Pain:  Vitals:   05/23/20 1158  TempSrc:   PainSc: 0-No pain                 [name]

## 2020-05-26 ENCOUNTER — Other Ambulatory Visit: Payer: Self-pay | Admitting: Family Medicine

## 2020-05-26 ENCOUNTER — Encounter: Payer: Self-pay | Admitting: Gastroenterology

## 2020-05-26 ENCOUNTER — Other Ambulatory Visit (HOSPITAL_COMMUNITY): Payer: Self-pay | Admitting: Family Medicine

## 2020-05-26 DIAGNOSIS — M5136 Other intervertebral disc degeneration, lumbar region: Secondary | ICD-10-CM | POA: Diagnosis not present

## 2020-05-26 DIAGNOSIS — M5441 Lumbago with sciatica, right side: Secondary | ICD-10-CM | POA: Diagnosis not present

## 2020-05-26 LAB — SURGICAL PATHOLOGY

## 2020-05-27 ENCOUNTER — Encounter: Payer: Self-pay | Admitting: Gastroenterology

## 2020-05-27 ENCOUNTER — Telehealth: Payer: Self-pay

## 2020-05-27 DIAGNOSIS — Z86010 Personal history of colonic polyps: Secondary | ICD-10-CM

## 2020-05-27 NOTE — Telephone Encounter (Signed)
Called patient and left her a voicemail to call me back. 

## 2020-05-27 NOTE — Telephone Encounter (Signed)
-----   Message from Virgel Manifold, MD sent at 05/27/2020  9:31 AM EST ----- Judy Torres please let the patient know, her polyps were precancerous and were removed.  Her next colonoscopy is recommended to be in 3 years.  I also recommend referral for genetic screening/counseling due to her history of multiple polyps on this colonoscopy and the previous colonoscopy.  Please place referral.

## 2020-05-28 NOTE — Telephone Encounter (Signed)
Patient's husband called with patient and I gave them the below results. I also told them that they will be getting a call from the genetic counselor to schedule an appointment. They both agreed since they had me on speaker.

## 2020-06-05 NOTE — Progress Notes (Signed)
[facility]  [address], [address] [place], [place] [address] Phone: [phone]  Fax: [phone]  Office Visit:  06/09/2020   Referring physician: [name], MD  Chief Complaint: In Lauralie Blacksher is a 74 y.o. 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  HPI: The patient was last seen in the medical oncology clinic on 02/07/2020. At that time, she was doing well.  She denied any fevers, sweats or weight loss.  Exam was stable without adenopathy or hepatosplenomegaly. Hematocrit was 37.0, hemoglobin 11.9, platelets 181,000, WBC 6,800. Creatinine was 1.04 (CrCl 54 ml/min).  Uric acid was 5.9. LDH was 213. We discussed continued surveillance.  Port-a-cath was removed on 02/28/2020 by Dr. Lucky Cowboy.  Stress test on 03/18/2020 revealed normal wall motion, EF estimated at 37%, possibly suppressed secondary to GI uptake artifact versus conduction abnormality. There were no EKG changes concerning for ischemia at peak stress or in recovery. CT attenuation correction images with moderate three-vessel coronary calcification, moderate aortic atherosclerosis. Resting EKG with right bundle branch block.  Colonoscopy on 05/23/2020 by Dr. Bonna Gains revealed two 3 to 4 mm polyps in the transverse colon and in the ascending colon. There were three 6 to 7 mm polyps in the transverse colon. All polyps were tubular adenomas. There was a post-polypectomy scar in the recto-sigmoid colon. There was diverticulosis in the sigmoid colon.  The patient saw Marrianne Mood, [place] on 04/02/2020. She was doing well. She reported mild dyspnea on exertion. She declined further work-up with MPI and cardiac catheterization. She wanted to stop testing and enjoy life at that time.  During the interim, she has been "okay." She reports mild shortness of breath on exertion. She is scheduled for an MRI tomorrow for back pain. She denies fevers, sweats, weight loss,  orthopnea, lumps or bumps, leg swelling, bruising, or bleeding.  The patient is taking vitamin B12. She is no longer taking allopurinol.   Past Medical History:  Diagnosis Date  . Cancer [facility])    Stomach cancer  . Chronic kidney disease   . GERD (gastroesophageal reflux disease)   . HTN (hypertension)   . Hyperlipidemia   . Lupus ([place])   . S/P angioplasty with stent   . Wears dentures    full upper and lower    Past Surgical History:  Procedure Laterality Date  . CARDIAC CATHETERIZATION     stent  . CARDIAC SURGERY    . CATARACT EXTRACTION W/PHACO Right 11/15/2019   Procedure: CATARACT EXTRACTION PHACO AND INTRAOCULAR LENS PLACEMENT (IOC) RIGHT  VISION BLUE 7.48 00:45.8;  Surgeon: Marchia Meiers, MD;  Location: [facility];  Service: Ophthalmology;  Laterality: Right;  Port a cath  . CATARACT EXTRACTION W/PHACO Left 12/20/2019   Procedure: CATARACT EXTRACTION PHACO AND INTRAOCULAR LENS PLACEMENT ([place]) LEFT VISION BLUE;  Surgeon: Marchia Meiers, MD;  Location: [facility];  Service: Ophthalmology;  Laterality: Left;  6.17 0:36.1  . CESAREAN SECTION    . COLONOSCOPY N/A 12/30/2017   Procedure: COLONOSCOPY;  Surgeon: Lin Landsman, MD;  Location: [facility] ENDOSCOPY;  Service: Gastroenterology;  Laterality: N/A;  . COLONOSCOPY WITH PROPOFOL N/A 05/23/2020   Procedure: COLONOSCOPY WITH PROPOFOL;  Surgeon: Virgel Manifold, MD;  Location: [facility] ENDOSCOPY;  Service: Endoscopy;  Laterality: N/A;  . CORONARY ARTERY BYPASS GRAFT  2003  . ESOPHAGOGASTRODUODENOSCOPY N/A 12/30/2017   Procedure: ESOPHAGOGASTRODUODENOSCOPY (EGD);  Surgeon: Lin Landsman, MD;  Location: [facility] ENDOSCOPY;  Service: Gastroenterology;  Laterality: N/A;  . ESOPHAGOGASTRODUODENOSCOPY Left 02/05/2018   Procedure: ESOPHAGOGASTRODUODENOSCOPY (EGD);  Surgeon: Pasty Spillers, MD;  Location: [facility] ENDOSCOPY;  Service: Endoscopy;  Laterality: Left;  . ESOPHAGOGASTRODUODENOSCOPY (EGD) WITH PROPOFOL N/A  03/15/2018   Procedure: ESOPHAGOGASTRODUODENOSCOPY (EGD) WITH PROPOFOL;  Surgeon: Pasty Spillers, MD;  Location: [facility] ENDOSCOPY;  Service: Endoscopy;  Laterality: N/A;  . FLEXIBLE SIGMOIDOSCOPY N/A 12/31/2017   Procedure: FLEXIBLE SIGMOIDOSCOPY;  Surgeon: Wyline Mood, MD;  Location: [facility] ENDOSCOPY;  Service: Gastroenterology;  Laterality: N/A;  . PORTA CATH INSERTION N/A 04/19/2018   Procedure: PORTA CATH INSERTION;  Surgeon: Annice Needy, MD;  Location: [facility] INVASIVE CV LAB;  Service: Cardiovascular;  Laterality: N/A;  . PORTA CATH REMOVAL N/A 02/28/2020   Procedure: PORTA CATH REMOVAL;  Surgeon: Annice Needy, MD;  Location: [facility] INVASIVE CV LAB;  Service: Cardiovascular;  Laterality: N/A;    Family History  Problem Relation Age of Onset  . Hypertension Mother   . Stroke Mother   . Vascular Disease Mother   . Hypertension Sister     Social History:  reports that she quit smoking about 18 years ago. Her smoking use included cigarettes. She has a 10.00 pack-year smoking history. She has never used smokeless tobacco. She reports previous alcohol use. She reports that she does not use drugs. She has smoked 1/2 pack/day x 20 years. She stopped smoking in 2000. She denies any exposure to radiation or toxins. She is from [place]. She moved to the [place] 52 years ago after marrying her husband. She was in the [employer]. Her husband phone([phone]). The patient is alone today.    Allergies:  Allergies  Allergen Reactions  . Penicillins Other (See Comments)    Pass out Has patient had a PCN reaction causing immediate rash, facial/tongue/throat swelling, SOB or lightheadedness with hypotension: Yes Has patient had a PCN reaction causing severe rash involving mucus membranes or skin necrosis: No Has patient had a PCN reaction that required hospitalization: No Has patient had a PCN reaction occurring within the last 10 years: No If all of the above answers are "NO", then may  proceed with Cephalosporin use.   . Sulfa Antibiotics Nausea Only  . Sulfasalazine Nausea And Vomiting       . Ace Inhibitors Nausea Only  . Losartan Anxiety  . Spironolactone Other (See Comments)    Felt poorly but couldn't clarify     Current Medications: Current Outpatient Medications  Medication Sig Dispense Refill  . acetaminophen (TYLENOL) 500 MG tablet Take 1,000 mg by mouth 2 (two) times daily as needed for moderate pain or headache.    . alendronate (FOSAMAX) 70 MG tablet Take 70 mg by mouth every Saturday.     Marland Kitchen amLODipine (NORVASC) 10 MG tablet Take 10 mg by mouth daily.    . Calcium Carb-Cholecalciferol (CALCIUM 600-D PO) Take 600 mg by mouth daily.    . carvedilol (COREG) 6.25 MG tablet Take 0.5 tablets (3.125 mg total) by mouth 2 (two) times daily. 90 tablet 3  . clotrimazole-betamethasone (LOTRISONE) cream Apply 1 application topically 2 (two) times daily as needed (rash).     Marland Kitchen esomeprazole (NEXIUM) 20 MG capsule Take 20 mg by mouth daily before breakfast.     . fluticasone (FLONASE) 50 MCG/ACT nasal spray Place 1 spray into both nostrils daily as needed for allergies or rhinitis.    . hydroxychloroquine (PLAQUENIL) 200 MG tablet Take 200 mg by mouth 2 (two) times daily.     Marland Kitchen  lidocaine-prilocaine (EMLA) cream Apply to affected area once 30 g 3  . Multiple Vitamin (MULTIVITAMIN WITH MINERALS) TABS tablet Take 1 tablet by mouth daily. 180 tablet 0  . ondansetron (ZOFRAN) 4 MG tablet Take 1 tablet (4 mg total) by mouth 2 (two) times daily as needed for refractory nausea / vomiting. Start on day 3 after cyclophosphamide chemotherapy. 30 tablet 3  . polyethylene glycol (MIRALAX / GLYCOLAX) packet Take 17 g by mouth daily.    . predniSONE (DELTASONE) 5 MG tablet Take 5 mg by mouth daily with breakfast.     . Pseudoephedrine-APAP 30-325 MG TABS Take 1 tablet by mouth daily as needed (sinuses).     . simvastatin (ZOCOR) 40 MG tablet Take 40 mg by mouth daily.     . vitamin  B-12 (CYANOCOBALAMIN) 500 MCG tablet Take 500 mcg by mouth daily.    Marland Kitchen allopurinol (ZYLOPRIM) 100 MG tablet Take 0.5 tablets (50 mg total) by mouth daily. (Patient not taking: Reported on 06/09/2020) 45 tablet 1  . traMADol (ULTRAM) 50 MG tablet Take 50 mg by mouth every 6 (six) hours as needed.     No current facility-administered medications for this visit.    Review of Systems  Constitutional: Positive for weight loss (7 lbs). Negative for chills, diaphoresis, fever and malaise/fatigue.       Feels "pretty good".  HENT: Negative.  Negative for congestion, ear discharge, ear pain, hearing loss, nosebleeds, sinus pain, sore throat and tinnitus.   Eyes: Negative.  Negative for blurred vision, double vision and photophobia.  Respiratory: Positive for shortness of breath (mild on exertion). Negative for cough, hemoptysis and sputum production.   Cardiovascular: Negative for chest pain, palpitations, orthopnea, leg swelling and PND.  Gastrointestinal: Negative.  Negative for abdominal pain, blood in stool, constipation, diarrhea, heartburn, melena, nausea and vomiting.  Genitourinary: Negative.  Negative for dysuria, frequency, hematuria and urgency.  Musculoskeletal: Positive for back pain. Negative for falls, joint pain, myalgias and neck pain.  Skin: Negative.  Negative for itching and rash.  Neurological: Negative.  Negative for dizziness, tingling, speech change, weakness and headaches.  Endo/Heme/Allergies: Negative.  Does not bruise/bleed easily.  Psychiatric/Behavioral: Negative.  Negative for depression and memory loss. The patient is not nervous/anxious and does not have insomnia.   All other systems reviewed and are negative.   Performance status (ECOG): 0-1  Vital Signs Blood pressure (!) 148/87, pulse 87, temperature 98.4 F (36.9 C), temperature source Tympanic, weight 114 lb 10.2 oz (52 kg), SpO2 100 %.   Physical Exam Vitals and nursing note reviewed.  Constitutional:       General: She is not in acute distress.    Appearance: Normal appearance. She is well-developed. She is not diaphoretic.     Interventions: Face mask in place.  HENT:     Head: Normocephalic and atraumatic.     Comments: Curly black hair.    Mouth/Throat:     Mouth: Mucous membranes are moist. No oral lesions.  Eyes:     Conjunctiva/sclera: Conjunctivae normal.     Pupils: Pupils are equal, round, and reactive to light.     Comments: Glasses.  Brown eyes.  Neck:     Vascular: No carotid bruit.  Cardiovascular:     Rate and Rhythm: Normal rate and regular rhythm.     Pulses: Normal pulses.     Heart sounds: Normal heart sounds. No murmur heard.  No friction rub. No gallop.   Pulmonary:  Effort: Pulmonary effort is normal.     Breath sounds: Normal breath sounds. No wheezing, rhonchi or rales.  Abdominal:     General: Bowel sounds are normal.     Palpations: Abdomen is soft. There is no hepatomegaly, splenomegaly or mass.     Tenderness: There is no abdominal tenderness.     Hernia: No hernia is present.  Musculoskeletal:        General: No tenderness. Normal range of motion.     Cervical back: Normal range of motion and neck supple.     Right lower leg: No edema.     Left lower leg: No edema.  Lymphadenopathy:     Head:     Right side of head: No preauricular, posterior auricular or occipital adenopathy.     Left side of head: No preauricular, posterior auricular or occipital adenopathy.     Cervical: No cervical adenopathy.     Upper Body:     Right upper body: No supraclavicular or axillary adenopathy.     Left upper body: No supraclavicular or axillary adenopathy.     Lower Body: No right inguinal adenopathy. No left inguinal adenopathy.  Skin:    General: Skin is warm and dry.     Findings: No bruising, erythema, lesion or rash.  Neurological:     Mental Status: She is alert and oriented to person, place, and time.     Motor: No weakness.  Psychiatric:         Mood and Affect: Mood and affect normal.        Behavior: Behavior normal.        Thought Content: Thought content normal.        Judgment: Judgment normal.    Imaging studies: 04/06/2018:PET scanrevealed enlarged and hypermetabolic supraclavicular, mediastinal, abdominal and pelvic adenopathy. Additional smaller hypermetabolic lymph nodes identified within the left cervical lymph node chain. Largest lymph node was a 5.7 cm subcarinal lymph node (SUV 26.78). 07/03/2018:Chest, abdomen, and pelvic CTrevealed marked interval response to therapy. Previously identified relatively bulky lymphadenopathy in the chest, abdomen, and pelvis had resolved with no pathologically enlarged lymphadenopathy. There was no new or progressive interval findings. 09/25/2018:PET scanrevealed partial response to chemotherapy. The bulky intensely hypermetabolic mediastinal adenopathy was near completely resolved; however a solitary 1.3 cm node remained with intense radiotracer activity (Deauville 4). There was complete metabolic response and size reduction of cervical lymph nodes, retroperitoneal periaortic nodes and pelvic nodes (Deauville 1). 01/09/2019:ChestCTwithout contrastrevealed severe bilateral paramediastinal radiation fibrosis changes.There was underlying emphysematous changes and pulmonary scarring. There were no findings suspicious for pulmonary metastatic disease. There was interval decrease in size of the precarinal lymph node when compared to prior study. There was stable advanced atherosclerotic calcifications involving the thoracic and upper abdominal aorta and branch vessels including dense three-vessel coronary artery calcifications. 03/20/2019:  PET scan revealed further improvement in hypermetabolic activity associated with a small residual pre-carinal lymph node which had decreased in size compared with the prior PET-CT of 6 months ago (still Deauville 4).  There was no other residual or  recurrent hypermetabolic nodal activity in the neck, chest, abdomen or pelvis (Deauville 1). There was resolving radiation changes in both lungs. There was stable extensive atherosclerosis and moderate centrilobular emphysema.  10/01/2019:  Chest, abdomen, and pelvis CT revealed no active lymphoma.  The index precarinal lymph node measured 0.6 cm (0.1 cm smaller than previously).   She has coronary atherosclerosis with prior CABG. there was prominent main pulmonary artery felt secondary  to pulmonary artery hypertension.  There was esophageal dysmotility or reflux.  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  There was nonobstructive right nephrolithiasis.  There was a stable rim calcified lesion in the left ovary.   Appointment on 06/09/2020  Component Date Value Ref Range Status  . Uric Acid, Serum 06/09/2020 5.7  2.5 - 7.1 mg/dL Final   Performed at [facility], [address] [place], [place] [address]  . LDH 06/09/2020 206* 98 - 192 U/L Final   Performed at [facility], [address] [place], [place] [address]  . Sodium 06/09/2020 138  135 - 145 mmol/L Final  . Potassium 06/09/2020 3.4* 3.5 - 5.1 mmol/L Final  . Chloride 06/09/2020 102  98 - 111 mmol/L Final  . CO2 06/09/2020 24  22 - 32 mmol/L Final  . Glucose, Bld 06/09/2020 99  70 - 99 mg/dL Final   Glucose reference range applies only to samples taken after fasting for at least 8 hours.  . BUN 06/09/2020 22  8 - 23 mg/dL Final  . Creatinine, Ser 06/09/2020 1.02* 0.44 - 1.00 mg/dL Final  . Calcium 06/09/2020 8.6* 8.9 - 10.3 mg/dL Final  . Total Protein 06/09/2020 6.2* 6.5 - 8.1 g/dL Final  . Albumin 06/09/2020 3.7  3.5 - 5.0 g/dL Final  . AST 06/09/2020 21  15 - 41 U/L Final  . ALT 06/09/2020 11  0 - 44 U/L Final  . Alkaline Phosphatase 06/09/2020 66  38 - 126 U/L Final  . Total Bilirubin 06/09/2020 0.2* 0.3 - 1.2 mg/dL Final  . GFR, Estimated 06/09/2020 58* >60  mL/min Final   Comment: (NOTE) Calculated using the CKD-EPI Creatinine Equation (2021)   . Anion gap 06/09/2020 12  5 - 15 Final   Performed at [facility], [address], [place], [place] [address]  . WBC 06/09/2020 7.6  4.0 - 10.5 K/uL Final  . RBC 06/09/2020 3.94  3.87 - 5.11 MIL/uL Final  . Hemoglobin 06/09/2020 12.3  12.0 - 15.0 g/dL Final  . HCT 06/09/2020 37.5  36 - 46 % Final  . MCV 06/09/2020 95.2  80.0 - 100.0 fL Final  . MCH 06/09/2020 31.2  26.0 - 34.0 pg Final  . MCHC 06/09/2020 32.8  30.0 - 36.0 g/dL Final  . RDW 06/09/2020 12.8  11.5 - 15.5 % Final  . Platelets 06/09/2020 198  150 - 400 K/uL Final  . nRBC 06/09/2020 0.0  0.0 - 0.2 % Final  . Neutrophils Relative % 06/09/2020 75  % Final  . Neutro Abs 06/09/2020 5.7  1.7 - 7.7 K/uL Final  . Lymphocytes Relative 06/09/2020 15  % Final  . Lymphs Abs 06/09/2020 1.1  0.7 - 4.0 K/uL Final  . Monocytes Relative 06/09/2020 9  % Final  . Monocytes Absolute 06/09/2020 0.7  0.1 - 1.0 K/uL Final  . Eosinophils Relative 06/09/2020 1  % Final  . Eosinophils Absolute 06/09/2020 0.1  0.0 - 0.5 K/uL Final  . Basophils Relative 06/09/2020 0  % Final  . Basophils Absolute 06/09/2020 0.0  0.0 - 0.1 K/uL Final  . Immature Granulocytes 06/09/2020 0  % Final  . Abs Immature Granulocytes 06/09/2020 0.03  0.00 - 0.07 K/uL Final   Performed at [facility], [address] [place], [place] [address]    Assessment:  In Kenitha Glendinning is a 74 y.o. female with stage IVBdiffuse large B cell  lymphomainvolving the gastric fundus and duodenum. IPI is 3 (intermediate-high risk), age-adjusted IPI is 2 (low-intermediate risk), revised IPI is 3 (poor), and NCCN-IPI is 5 (intermediate-high risk). CNS risk is 3 (intermediate: age, LDH, stage).  EGDon 03/15/2018 revealed pathology from the duodenal scalloped lesion and duodenal sweep ulcerwith involvement with diffuse large B cell lymphoma. Random gastric biopsy was negative  for lymphoma. There was mild chronic gastritis with reactive foveolar hyperplasia. Gastric fundus scarrevealed involvement with diffuse large B cell lymphoma. GE junction biopsy revealed no lymphoma with squamocolumnar mucosa with mild chronic active inflammation.  The lymphoma has a non-germinal center immunophenotype with a Ki-67 proliferation index of 60% and shows Bcl-2/CMYC co-expression by immunohistochemical analysis. Summary of IHC results, neoplastic cells: Positive: PAX-5, CD20(variable), Bcl-2, MUM1, Bcl-6 (variable), CMYC. Negative: CD10, CD5, CD30, CD138, Cyclin-D1, ALK-1, CD3. EBV (ISH): Negative. FISH for MYCgene rearrangement (8q24 gene region) was negative.  PET scanon 04/06/2018 revealed enlarged and hypermetabolic supraclavicular, mediastinal, abdominal and pelvic adenopathy. Additional smaller hypermetabolic lymph nodes identified within the left cervical lymph node chain. Largest lymph node was a 5.7 cm subcarinal lymph node (SUV 26.78).  Bone marrow aspirate and biopsyon 04/12/2018 revealed a slightly hypercellular bone marrow for age with trilineage hematopoiesis. There were dyspoietic changes associated with the granulocyte series. Flow cytometry revealed a predominance of T lymphocytes with relative abundance of CD8 + cells. There was no significant B cell population. Cytogenetics are pending.  Echorevealed an EF of 55-60% on 04/04/2018, 50-55% on 07/19/2018, and 45-50% on 04/03/2019.  Stress test on 03/18/2020 revealed normal wall motion, EF estimated at 37%.  She presented with a normocytic anemia. She was admitted x 2 (12/2017 and 01/2018) with rectal bleeding. She is intolerant of oral iron. Dietis modest.  EGDon 12/30/2017 revealed non bleeding gastric erosionsin the antrum. Colonoscopyon 12/30/2017 revealed numerous polyps(one 5 mm in ascending colon, two 7 mm polyps in transverse colon, five 5-7 mm polyps in the descending colon, one 7 mm  polyp in the sigmoid colon, one 8 mm polyp in the recto-sigmoid colon, and one 19 mm polyp in the rectum). 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   She developed hematochezia post colonoscopy. Flexible sigmoidoscopyon 12/31/2017 revealed a rectal ulcer at the site of the polypectomy and blood in the rectum. Clips were placed. EGDon 02/05/2018 revealed 2 bleeding AVMs (treated with APC) in the small bowel with scalloped/denuded mucosa underneath the bleeding sites.  Colonoscopy on 05/23/2020 revealed two 3 to 4 mm polyps in the transverse colon and in the ascending colon. There were three 6 to 7 mm polyps in the transverse colon. All polyps were tubular adenomas. There was a post-polypectomy scar in the recto-sigmoid colon. There was diverticulosis in the sigmoid colon.  Work-up on 09/16/2019revealed a hematocrit of 32.5, hemoglobin 10.7, MCV 91.7, platelets 207,000, WBC 1800 with an ANC of 600. Uric acid was 8.8. LDH was 241 (98-192) and beta2-microglobulin 5.8 (0.6-2.4). Negative studiesincluded: hepatitis B core antibody total, hepatitis B surface antigen, hepatitis C antibody, HIV testing.   She has required 4 units of PRBCs(3 units in 12/2017 and 1 unit in 01/2018).  Ferritinhas been followed: 30 on 12/29/2017, 93 on 02/13/2018, 57 on 03/07/2018, 38 on 04/03/2018, 93 on 03/22/2019, and 123 on 06/20/2019.  Labs on 12/29/2017 revealed the following: B12was 254 (low normal). She has been on oral B12since 03/07/2018. B12 was 1524 on 04/03/2018. Folate6.3 (>5.9) on 12/29/2017 and 55.8 on 04/03/2018.   Shereceived6cycles of mini-RCHOP(04/24/2018 - 09/10/2018) withgrowth factor support ([place]  On-Pro Neulasta). Cycle #1 platelet nadir was 82,000. Cycle #4 was postponed on 07/13/2018 ([place] 0).   PET scanon 09/25/2018 revealed partial response to chemotherapy. The bulky intensely hypermetabolic mediastinal adenopathy was near  completely resolved; however a solitary 1.3 cm node remained with intense radiotracer activity (Deauville 4). There was complete metabolic response and size reduction of cervical lymph nodes, retroperitoneal periaortic nodes and pelvic nodes (Deauville 1).  She receivedradiation therapywith 3000 cGy to the mediastinum and 3600 cGy to the hypermetabolic lymph node from 54/62/7035 - 12/04/2018.  ChestCTwithout contraston 01/09/2019 revealed severe bilateral paramediastinal radiation fibrosis changes.There was underlying emphysematous changes and pulmonary scarring. There were no findings suspicious for pulmonary metastatic disease. There was interval decrease in size of the precarinal lymph node when compared to prior study. There was stable advanced atherosclerotic calcifications involving the thoracic and upper abdominal aorta and branch vessels including dense three-vessel coronary artery calcifications.  PET scan on 03/20/2019 revealed further improvement in hypermetabolic activity associated with a small residual pre-carinal lymph node which had decreased in size compared with the prior PET-CT of 6 months ago (still Deauville 4).  There was no other residual or recurrent hypermetabolic nodal activity in the neck, chest, abdomen or pelvis (Deauville 1). There was resolving radiation changes in both lungs. There was stable extensive atherosclerosis and moderate centrilobular emphysema.   Chest, abdomen, and pelvis CT on 10/01/2019 revealed no active lymphoma.  The index precarinal lymph node measured 0.6 cm (0.1 cm smaller than previously).   Colonoscopy on 05/23/2020  revealed two 3 to 4 mm polyps in the transverse colon and in the ascending colon. There were three 6 to 7 mm polyps in the transverse colon. All polyps were tubular adenomas. There was a post-polypectomy scar in the recto-sigmoid colon. There was diverticulosis in the sigmoid colon.  She has chronicrenal insufficiency.  Creatinine was 1.42 (CrCl 25 ml/min) on 04/03/2018.  She haslupus(diagnosed 1982). She is on prednisone 5 mg a day and Plaquenil.  Symptomatically, she has been "ok."  She notes mild shortness of breath on exertion. She denies any B symptoms.  Exam reveals no adenopathy or hepatosplenomegaly.  Plan: 1.   Labs today: CBC with diff, CMP, LDH, uric acid, ferritin, B12, folate. 2.Stage IVB diffuse large B cell lymphoma Clinically, she is doing well. Exam exam reveals no adenopathy or hepatosplenomegaly. Shereceived 6 cycles of mini R-CHOP (last 08/31/2018). She had a partial response to treatment. She underwent involved field radiation to the mediastinum (completed05/18/2020). PET on09/07/2018 revealed improvement in the mediastinal lymph node hypermetabolic activity.            She had no evidence of recurrent disease in the neck chest abdomen or pelvis. Chest, abdomen and pelvic CT scan on 10/01/2019 revealed no evidence of recurrent lymphoma. Review plan for ongoing surveillance.  Consider follow-up scans early next year. If recurrence documented, plan Revlimid and Rituxan. 3.   Declining ejection fraction  Echo on 04/04/2018:  EF was 55-60%  Echo on 07/19/2018:  EF was 50-55%  Echo on 04/03/2019:  EF was 45-50%.  Stress test on 03/18/2020 revealed normal wall motion, EF estimated at 37%.  She denies any symptoms of CHF.  She declines further work-up. 4.   Elevated uric acid  Uric acid 5.7 today.  Patient discontinued allopurinol.  Monitor with each visit. 5.   GI issues  She denies any reflux on Protonix.  Review interval colonoscopy.   She had multiple tubular adenomas.  Anticipate follow-up colonoscopy in 3-5 years.  6.   B12 deficiency  Patient is on oral B12.  Check B12 and folate today. 7.   RTC in 4 months for MD assessment and labs (CBC with diff, CMP, LDH, uric acid).  Addendum:  B12 level was 6271 (high) and folate 9.9.  Patient will be contacted regarding  holding her B12 and then repeating in 3-4 weeks.  I discussed the assessment and treatment plan with the patient.  The patient was provided an opportunity to ask questions and all were answered.  The patient agreed with the plan and demonstrated an understanding of the instructions.  The patient was advised to call back if the symptoms worsen or if the condition fails to improve as anticipated.   Lequita Asal, MD, PhD    06/09/2020,2:21 PM   I, Mirian Mo Tufford, am acting as a Education administrator for [employer]. Mike Gip, MD.   I, Jhordyn Hoopingarner C. Mike Gip, MD, have reviewed the above documentation for accuracy and completeness, and I agree with the above.

## 2020-06-08 ENCOUNTER — Other Ambulatory Visit: Payer: Self-pay | Admitting: Hematology and Oncology

## 2020-06-08 DIAGNOSIS — D649 Anemia, unspecified: Secondary | ICD-10-CM

## 2020-06-08 DIAGNOSIS — E538 Deficiency of other specified B group vitamins: Secondary | ICD-10-CM

## 2020-06-09 ENCOUNTER — Inpatient Hospital Stay: Payer: Medicare HMO | Attending: Hematology and Oncology

## 2020-06-09 ENCOUNTER — Other Ambulatory Visit: Payer: Self-pay

## 2020-06-09 ENCOUNTER — Inpatient Hospital Stay (HOSPITAL_BASED_OUTPATIENT_CLINIC_OR_DEPARTMENT_OTHER): Payer: Medicare HMO | Admitting: Hematology and Oncology

## 2020-06-09 ENCOUNTER — Encounter: Payer: Self-pay | Admitting: Hematology and Oncology

## 2020-06-09 VITALS — BP 148/87 | HR 87 | Temp 98.4°F | Wt 114.6 lb

## 2020-06-09 DIAGNOSIS — D649 Anemia, unspecified: Secondary | ICD-10-CM

## 2020-06-09 DIAGNOSIS — I429 Cardiomyopathy, unspecified: Secondary | ICD-10-CM

## 2020-06-09 DIAGNOSIS — I10 Essential (primary) hypertension: Secondary | ICD-10-CM | POA: Diagnosis not present

## 2020-06-09 DIAGNOSIS — N189 Chronic kidney disease, unspecified: Secondary | ICD-10-CM | POA: Insufficient documentation

## 2020-06-09 DIAGNOSIS — E538 Deficiency of other specified B group vitamins: Secondary | ICD-10-CM | POA: Diagnosis not present

## 2020-06-09 DIAGNOSIS — Z79899 Other long term (current) drug therapy: Secondary | ICD-10-CM | POA: Diagnosis not present

## 2020-06-09 DIAGNOSIS — Z9221 Personal history of antineoplastic chemotherapy: Secondary | ICD-10-CM | POA: Diagnosis not present

## 2020-06-09 DIAGNOSIS — Z8572 Personal history of non-Hodgkin lymphomas: Secondary | ICD-10-CM | POA: Insufficient documentation

## 2020-06-09 DIAGNOSIS — D126 Benign neoplasm of colon, unspecified: Secondary | ICD-10-CM | POA: Diagnosis not present

## 2020-06-09 DIAGNOSIS — Z95828 Presence of other vascular implants and grafts: Secondary | ICD-10-CM

## 2020-06-09 DIAGNOSIS — C8330 Diffuse large B-cell lymphoma, unspecified site: Secondary | ICD-10-CM | POA: Diagnosis not present

## 2020-06-09 DIAGNOSIS — Z7189 Other specified counseling: Secondary | ICD-10-CM

## 2020-06-09 DIAGNOSIS — E790 Hyperuricemia without signs of inflammatory arthritis and tophaceous disease: Secondary | ICD-10-CM

## 2020-06-09 LAB — CBC WITH DIFFERENTIAL/PLATELET
Abs Immature Granulocytes: 0.03 10*3/uL (ref 0.00–0.07)
Basophils Absolute: 0 10*3/uL (ref 0.0–0.1)
Basophils Relative: 0 %
Eosinophils Absolute: 0.1 10*3/uL (ref 0.0–0.5)
Eosinophils Relative: 1 %
HCT: 37.5 % (ref 36.0–46.0)
Hemoglobin: 12.3 g/dL (ref 12.0–15.0)
Immature Granulocytes: 0 %
Lymphocytes Relative: 15 %
Lymphs Abs: 1.1 10*3/uL (ref 0.7–4.0)
MCH: 31.2 pg (ref 26.0–34.0)
MCHC: 32.8 g/dL (ref 30.0–36.0)
MCV: 95.2 fL (ref 80.0–100.0)
Monocytes Absolute: 0.7 10*3/uL (ref 0.1–1.0)
Monocytes Relative: 9 %
Neutro Abs: 5.7 10*3/uL (ref 1.7–7.7)
Neutrophils Relative %: 75 %
Platelets: 198 10*3/uL (ref 150–400)
RBC: 3.94 MIL/uL (ref 3.87–5.11)
RDW: 12.8 % (ref 11.5–15.5)
WBC: 7.6 10*3/uL (ref 4.0–10.5)
nRBC: 0 % (ref 0.0–0.2)

## 2020-06-09 LAB — COMPREHENSIVE METABOLIC PANEL
ALT: 11 U/L (ref 0–44)
AST: 21 U/L (ref 15–41)
Albumin: 3.7 g/dL (ref 3.5–5.0)
Alkaline Phosphatase: 66 U/L (ref 38–126)
Anion gap: 12 (ref 5–15)
BUN: 22 mg/dL (ref 8–23)
CO2: 24 mmol/L (ref 22–32)
Calcium: 8.6 mg/dL — ABNORMAL LOW (ref 8.9–10.3)
Chloride: 102 mmol/L (ref 98–111)
Creatinine, Ser: 1.02 mg/dL — ABNORMAL HIGH (ref 0.44–1.00)
GFR, Estimated: 58 mL/min — ABNORMAL LOW (ref >60)
Glucose, Bld: 99 mg/dL (ref 70–99)
Potassium: 3.4 mmol/L — ABNORMAL LOW (ref 3.5–5.1)
Sodium: 138 mmol/L (ref 135–145)
Total Bilirubin: 0.2 mg/dL — ABNORMAL LOW (ref 0.3–1.2)
Total Protein: 6.2 g/dL — ABNORMAL LOW (ref 6.5–8.1)

## 2020-06-09 LAB — VITAMIN B12: Vitamin B-12: 6271 pg/mL — ABNORMAL HIGH (ref 180–914)

## 2020-06-09 LAB — FOLATE: Folate: 9.9 ng/mL (ref >5.9)

## 2020-06-09 LAB — FERRITIN: Ferritin: 55 ng/mL (ref 11–307)

## 2020-06-09 LAB — URIC ACID: Uric Acid, Serum: 5.7 mg/dL (ref 2.5–7.1)

## 2020-06-09 LAB — LACTATE DEHYDROGENASE: LDH: 206 U/L — ABNORMAL HIGH (ref 98–192)

## 2020-06-09 NOTE — Progress Notes (Signed)
Patient here for oncology follow-up appointment, expresses no complaints or concerns at this time.    

## 2020-06-10 ENCOUNTER — Ambulatory Visit
Admission: RE | Admit: 2020-06-10 | Discharge: 2020-06-10 | Disposition: A | Discharge status: Home / Self Care | Payer: Medicare HMO | Source: Ambulatory Visit | Attending: Family Medicine | Admitting: Family Medicine

## 2020-06-10 ENCOUNTER — Telehealth: Payer: Self-pay

## 2020-06-10 DIAGNOSIS — M545 Low back pain, unspecified: Secondary | ICD-10-CM | POA: Diagnosis not present

## 2020-06-10 DIAGNOSIS — M5441 Lumbago with sciatica, right side: Secondary | ICD-10-CM | POA: Diagnosis not present

## 2020-06-10 NOTE — Telephone Encounter (Signed)
Left message for patient to call me back. 

## 2020-06-10 NOTE — Telephone Encounter (Signed)
-----   Message from Lequita Asal, MD sent at 06/10/2020  1:25 PM EST ----- Regarding: Please call patient  B12 level is too high.  What dose has she been taking?  Stop oral B12.  Recheck level in 3 weeks.  M ----- Message ----- From: Interface, Lab In [place] Sent: 06/09/2020   1:20 PM EST To: Lequita Asal, MD

## 2020-06-11 ENCOUNTER — Telehealth: Payer: Self-pay

## 2020-06-11 NOTE — Telephone Encounter (Signed)
-----   Message from Lequita Asal, MD sent at 06/10/2020  1:25 PM EST ----- Regarding: Please call patient  B12 level is too high.  What dose has she been taking?  Stop oral B12.  Recheck level in 3 weeks.  M ----- Message ----- From: Interface, Lab In [place] Sent: 06/09/2020   1:20 PM EST To: Lequita Asal, MD

## 2020-06-11 NOTE — Telephone Encounter (Signed)
Left message for patient to return call.

## 2020-06-17 ENCOUNTER — Telehealth: Payer: Self-pay

## 2020-06-17 DIAGNOSIS — M2012 Hallux valgus (acquired), left foot: Secondary | ICD-10-CM | POA: Diagnosis not present

## 2020-06-17 NOTE — Telephone Encounter (Signed)
Called patient to speak with her regarding b12 level. Patient didn't answer the phone. I called patient mobile and home phone.

## 2020-06-19 ENCOUNTER — Telehealth: Payer: Self-pay | Admitting: *Deleted

## 2020-06-19 NOTE — Telephone Encounter (Signed)
Mr Judy Torres called reporting that he had a message that B 12 is low and he is asking what they need to do about this. Please return his call

## 2020-06-23 DIAGNOSIS — S22000A Wedge compression fracture of unspecified thoracic vertebra, initial encounter for closed fracture: Secondary | ICD-10-CM | POA: Diagnosis not present

## 2020-07-02 ENCOUNTER — Other Ambulatory Visit: Payer: Self-pay

## 2020-07-02 ENCOUNTER — Telehealth: Payer: Self-pay

## 2020-07-02 DIAGNOSIS — E538 Deficiency of other specified B group vitamins: Secondary | ICD-10-CM

## 2020-07-02 NOTE — Telephone Encounter (Signed)
-----   Message from Lequita Asal, MD sent at 07/02/2020  5:42 AM EST ----- Regarding: Was patient contacted regarding HIGH B12  I see multiple call attempts and messages left to return call.  Last one from [place] states husband called back about "low B12".  Please call patient to discuss.  Thanks,  M

## 2020-07-02 NOTE — Telephone Encounter (Signed)
  Please keep this on your list.  M

## 2020-07-02 NOTE — Telephone Encounter (Signed)
Left message on both numbers in chart for patient to call me back

## 2020-07-08 DIAGNOSIS — M2012 Hallux valgus (acquired), left foot: Secondary | ICD-10-CM | POA: Diagnosis not present

## 2020-07-09 ENCOUNTER — Other Ambulatory Visit: Payer: Self-pay | Admitting: Podiatry

## 2020-07-09 ENCOUNTER — Inpatient Hospital Stay: Payer: Medicare HMO | Attending: Hematology and Oncology

## 2020-07-09 ENCOUNTER — Other Ambulatory Visit: Payer: Self-pay

## 2020-07-09 DIAGNOSIS — C8339 Diffuse large B-cell lymphoma, extranodal and solid organ sites: Secondary | ICD-10-CM | POA: Diagnosis not present

## 2020-07-09 DIAGNOSIS — E538 Deficiency of other specified B group vitamins: Secondary | ICD-10-CM | POA: Diagnosis not present

## 2020-07-09 LAB — VITAMIN B12: Vitamin B-12: 1114 pg/mL — ABNORMAL HIGH (ref 180–914)

## 2020-07-15 ENCOUNTER — Other Ambulatory Visit
Admission: RE | Admit: 2020-07-15 | Discharge: 2020-07-15 | Disposition: A | Discharge status: Home / Self Care | Payer: Medicare HMO | Source: Ambulatory Visit | Attending: Podiatry | Admitting: Podiatry

## 2020-07-15 ENCOUNTER — Other Ambulatory Visit: Payer: Self-pay

## 2020-07-15 DIAGNOSIS — Z01818 Encounter for other preprocedural examination: Secondary | ICD-10-CM | POA: Insufficient documentation

## 2020-07-15 DIAGNOSIS — I110 Hypertensive heart disease with heart failure: Secondary | ICD-10-CM | POA: Insufficient documentation

## 2020-07-15 DIAGNOSIS — I509 Heart failure, unspecified: Secondary | ICD-10-CM | POA: Insufficient documentation

## 2020-07-15 HISTORY — DX: Glomerular disease in systemic lupus erythematosus: M32.14

## 2020-07-15 HISTORY — DX: Raynaud's syndrome without gangrene: I73.00

## 2020-07-15 HISTORY — DX: Cardiomyopathy, unspecified: I42.9

## 2020-07-15 HISTORY — DX: Heart failure, unspecified: I50.9

## 2020-07-15 HISTORY — DX: Other cervical disc degeneration, unspecified cervical region: M50.30

## 2020-07-15 HISTORY — DX: Anemia, unspecified: D64.9

## 2020-07-15 HISTORY — DX: Peripheral vascular disease, unspecified: I73.9

## 2020-07-15 HISTORY — DX: Chronic obstructive pulmonary disease, unspecified: J44.9

## 2020-07-15 HISTORY — DX: Unspecified osteoarthritis, unspecified site: M19.90

## 2020-07-15 NOTE — Pre-Procedure Instructions (Addendum)
I spoke with patient over the phone for her preop interview.  She speaks English but needs to ask her husband to remind her of her history and previous treatments. Denies need for interpreter.  Contacted Dr. Karna Christmas' office to request a copy of clearance from PCP Dr. Edwina Barth.  They will fax it to [place].

## 2020-07-15 NOTE — Patient Instructions (Signed)
INSTRUCTIONS FOR SURGERY     Your surgery is scheduled for:   Friday, January 7TH, 2022     To find out your arrival time for the day of surgery,          please call [phone] between 1 pm and 3 pm on :  Thursday, January 6TH, 2022     When you arrive for surgery, report to the  [facility]. Someone will check you in and send       You to the second floor to the Surgical Registration Desk.         REMEMBER: Instructions that are not followed completely may result in serious medical risk,  up to and including death, or upon the discretion of your surgeon and anesthesiologist,            your surgery may need to be rescheduled.  __X__ 1. Do not eat food after midnight the night before your procedure.                    No gum, candy, lozenger, tic tacs, tums or hard candies.                  ABSOLUTELY NOTHING SOLID IN YOUR MOUTH AFTER MIDNIGHT                    You may drink unlimited clear liquids up to 2 hours before you are scheduled to arrive for surgery.                   Do not drink anything within those 2 hours unless you need to take medicine, then take the                   smallest amount you need.  Clear liquids include:  water, apple juice without pulp,                   any flavor Gatorade, Black coffee, black tea.  Sugar may be added but no dairy/ honey /lemon.                        Broth and jello is not considered a clear liquid.  __x__  2. On the morning of surgery, please brush your teeth with toothpaste and water. You may rinse with                  mouthwash if you wish but DO NOT SWALLOW TOOTHPASTE OR MOUTHWASH  __X___3. NO alcohol for 24 hours before or after surgery.  __x___ 4.  Do NOT smoke or use e-cigarettes for 24 HOURS PRIOR TO SURGERY.                      DO NOT Use any chewable tobacco products for at least 6 hours prior to surgery.  __x___ 5. If you start any new medication after this  appointment and prior to surgery, please                   Bring it with you on the day of surgery.  ___x__ 6. Notify your doctor if there is any change in your medical condition, such as fever,                   infection, vomitting, diarrhea or any open sores.  __x___ 7.  USE the CHG SOAP as instructed, the night before surgery and the day of surgery.                   Once you have washed with this soap, do NOT use any of the following: Powders, perfumes                    or lotions. Please do not wear make up, hairpins, clips or nail polish. You may wear deodorant.                   Men may shave their face and neck.  Women need to shave 48 hours prior to surgery.                   DO NOT wear ANY jewelry on the day of surgery. If there are rings that are too tight to                    remove easily, please address this prior to the surgery day. Piercings need to be removed.                                                                     NO METAL ON YOUR BODY.                    Do NOT bring any valuables.  If you came to Pre-Admit testing then you will not need license,                     insurance card or credit card.  If you will be staying overnight, please either leave your things in                     the car or have your family be responsible for these items.                     [facility] IS NOT RESPONSIBLE FOR BELONGINGS OR VALUABLES.  ___X__ 8. DO NOT wear contact lenses on surgery day.  You may not have dentures,                     Hearing aides, contacts or glasses in the operating room. These items can be                    Placed in the Recovery Room to receive immediately after surgery.  __x___ 9. IF YOU ARE SCHEDULED TO GO HOME ON THE SAME DAY, YOU MUST                   Have someone to drive you home and to stay with you  for the first 24 hours.                    Have an arrangement prior to arriving  on surgery day.  ___x__ 10. Take the following medications  on the morning of surgery with a sip of water:                              1. AMLODIPINE/ NORVASC                     2. FLONASE NASAL SPRAY                     3. PREDNISONE                     4. PLAQUENIL                     5.                  _X____ 11.  Follow any instructions provided to you by your surgeon.                        Such as enema, clear liquid bowel prep                       PLEASE COMPLETE THE PRESURGICAL CARBOHYDRATE DRINK BY                          TWO HOURS PRIOR TO ARRIVAL ON SURGERY DAY.  __X__  12. STOP ALL ASPIRIN PRODUCTS AS OF December 31ST.                        THIS INCLUDES BC POWDERS / GOODIES POWDER  __x___ 13. STOP Anti-inflammatories as of December 31ST                      This includes IBUPROFEN / MOTRIN / ADVIL / ALEVE/ NAPROXYN                    YOU MAY TAKE TYLENOL ANY TIME PRIOR TO SURGERY.  __X____17.  Continue to take the following medications but do not take on the morning of surgery:                          MIRALAX  __x____18. Wear clean and comfortable clothing to the hospital.                    A loose fitting pant would be good to wear.  You will have a large bandage on your foot                     And need to be able to get it through the pant leg.  Continue taking your night time medication.   Take your Fosamax on Saturday as usual.

## 2020-07-16 ENCOUNTER — Telehealth: Payer: Self-pay | Admitting: *Deleted

## 2020-07-16 ENCOUNTER — Encounter
Admission: RE | Admit: 2020-07-16 | Discharge: 2020-07-16 | Disposition: A | Discharge status: Home / Self Care | Payer: Medicare HMO | Source: Ambulatory Visit | Attending: Podiatry | Admitting: Podiatry

## 2020-07-16 DIAGNOSIS — I509 Heart failure, unspecified: Secondary | ICD-10-CM | POA: Insufficient documentation

## 2020-07-16 DIAGNOSIS — Z01818 Encounter for other preprocedural examination: Secondary | ICD-10-CM | POA: Diagnosis not present

## 2020-07-16 DIAGNOSIS — I110 Hypertensive heart disease with heart failure: Secondary | ICD-10-CM | POA: Diagnosis not present

## 2020-07-16 NOTE — Telephone Encounter (Signed)
-----   Message from Karen Kitchens, NP sent at 07/16/2020 10:46 AM EST ----- Regarding: Request for pre-operative cardiac clearance Request for pre-operative cardiac clearance:  1. What type of surgery is being performed?  ARTHRODESIS 1ST METATARSALPHALANGEAL JOINT (LEFT)  2. When is this surgery scheduled?  07/25/2020  3. Are there any medications that need to be held prior to surgery? NONE   4. Practice name and name of physician performing surgery?  Performing surgeon: Dr. Samara Deist, DPM Requesting clearance: Honor Loh, FNP-C    5. Anesthesia type (none, local, MAC, general)? General   6. What is the office phone and fax number?   Phone: [phone] Fax: [phone]  ATTENTION: Unable to create telephone message as per your standard workflow. Directed by [facility] providers to send requests for cardiac clearance to this pool for appropriate distribution to provider covering pre-operative clearances.   Honor Loh, MSN, APRN, FNP-C, CEN [facility]  Peri-operative Services Nurse Practitioner Phone: [phone] 07/16/20 10:46 AM

## 2020-07-16 NOTE — Telephone Encounter (Signed)
1. What type of surgery is being performed?  ARTHRODESIS 1ST METATARSALPHALANGEAL JOINT (LEFT)   2. When is this surgery scheduled?  07/25/2020    3. Are there any medications that need to be held prior to surgery?  NONE   4. Practice name and name of physician performing surgery?  Performing surgeon: Dr. Samara Deist, DPM  Requesting clearance: Honor Loh, FNP-C     5. Anesthesia type (none, local, MAC, general)? General   6. What is the office phone and fax number?   Phone: [phone]  Fax: [phone]

## 2020-07-16 NOTE — Telephone Encounter (Signed)
Left VM requesting call back 07/16/20 at 2:20PM.   Loel Dubonnet, NP

## 2020-07-17 NOTE — Telephone Encounter (Signed)
   Primary Cardiologist: Ida Rogue, MD  Chart reviewed as part of pre-operative protocol coverage. Patient was contacted 07/17/2020 in reference to pre-operative risk assessment for pending surgery as outlined below.  [name] was last seen on 04/02/20 by J.Visser, [place].  Since that day, [name] has done well. She denies chest pain, pressure, tightness, shortness of breath, dyspnea on exertion. She has an exercise tolerance of >4 METS.   Therefore, based on ACC/AHA guidelines, the patient would be at acceptable risk for the planned procedure without further cardiovascular testing.   The patient was advised that if she develops new symptoms prior to surgery to contact our office to arrange for a follow-up visit, and she verbalized understanding.  Of note, she is due for routine follow up and this was scheduled for 08/20/20 with Dr. Rockey Situ. This does not need to be completed prior to her surgical intervention.   I will route this recommendation to the requesting party via Epic fax function and remove from pre-op pool. Please call with questions.  Loel Dubonnet, NP 07/17/2020, 1:03 PM

## 2020-07-23 ENCOUNTER — Other Ambulatory Visit: Payer: Self-pay

## 2020-07-23 ENCOUNTER — Other Ambulatory Visit
Admission: RE | Admit: 2020-07-23 | Discharge: 2020-07-23 | Disposition: A | Discharge status: Home / Self Care | Payer: Medicare HMO | Source: Ambulatory Visit | Attending: Podiatry | Admitting: Podiatry

## 2020-07-23 DIAGNOSIS — Z01812 Encounter for preprocedural laboratory examination: Secondary | ICD-10-CM | POA: Insufficient documentation

## 2020-07-23 DIAGNOSIS — Z20822 Contact with and (suspected) exposure to covid-19: Secondary | ICD-10-CM | POA: Insufficient documentation

## 2020-07-24 LAB — SARS CORONAVIRUS 2 (TAT 6-24 HRS): SARS Coronavirus 2: NEGATIVE

## 2020-07-25 ENCOUNTER — Ambulatory Visit: Payer: Medicare HMO

## 2020-07-25 ENCOUNTER — Encounter
Admission: RE | Disposition: A | Discharge status: Home / Self Care | Payer: Self-pay | Source: Home / Self Care | Attending: Podiatry

## 2020-07-25 ENCOUNTER — Ambulatory Visit: Payer: Medicare HMO | Admitting: Anesthesiology

## 2020-07-25 ENCOUNTER — Encounter: Payer: Self-pay | Admitting: Podiatry

## 2020-07-25 ENCOUNTER — Ambulatory Visit
Admission: RE | Admit: 2020-07-25 | Discharge: 2020-07-25 | Disposition: A | Discharge status: Home / Self Care | Payer: Medicare HMO | Source: Home / Self Care | Attending: Podiatry | Admitting: Podiatry

## 2020-07-25 DIAGNOSIS — I130 Hypertensive heart and chronic kidney disease with heart failure and stage 1 through stage 4 chronic kidney disease, or unspecified chronic kidney disease: Secondary | ICD-10-CM | POA: Diagnosis present

## 2020-07-25 DIAGNOSIS — I5030 Unspecified diastolic (congestive) heart failure: Secondary | ICD-10-CM | POA: Diagnosis not present

## 2020-07-25 DIAGNOSIS — J80 Acute respiratory distress syndrome: Secondary | ICD-10-CM | POA: Diagnosis not present

## 2020-07-25 DIAGNOSIS — I4510 Unspecified right bundle-branch block: Secondary | ICD-10-CM | POA: Diagnosis not present

## 2020-07-25 DIAGNOSIS — D649 Anemia, unspecified: Secondary | ICD-10-CM | POA: Diagnosis not present

## 2020-07-25 DIAGNOSIS — Z79899 Other long term (current) drug therapy: Secondary | ICD-10-CM | POA: Insufficient documentation

## 2020-07-25 DIAGNOSIS — J9690 Respiratory failure, unspecified, unspecified whether with hypoxia or hypercapnia: Secondary | ICD-10-CM | POA: Diagnosis not present

## 2020-07-25 DIAGNOSIS — Z888 Allergy status to other drugs, medicaments and biological substances status: Secondary | ICD-10-CM | POA: Insufficient documentation

## 2020-07-25 DIAGNOSIS — J90 Pleural effusion, not elsewhere classified: Secondary | ICD-10-CM | POA: Diagnosis not present

## 2020-07-25 DIAGNOSIS — Z880 Allergy status to penicillin: Secondary | ICD-10-CM | POA: Insufficient documentation

## 2020-07-25 DIAGNOSIS — Z66 Do not resuscitate: Secondary | ICD-10-CM | POA: Diagnosis not present

## 2020-07-25 DIAGNOSIS — R0602 Shortness of breath: Secondary | ICD-10-CM | POA: Diagnosis present

## 2020-07-25 DIAGNOSIS — I5031 Acute diastolic (congestive) heart failure: Secondary | ICD-10-CM | POA: Diagnosis not present

## 2020-07-25 DIAGNOSIS — R000 Tachycardia, unspecified: Secondary | ICD-10-CM | POA: Diagnosis not present

## 2020-07-25 DIAGNOSIS — I214 Non-ST elevation (NSTEMI) myocardial infarction: Secondary | ICD-10-CM | POA: Diagnosis present

## 2020-07-25 DIAGNOSIS — E538 Deficiency of other specified B group vitamins: Secondary | ICD-10-CM | POA: Diagnosis present

## 2020-07-25 DIAGNOSIS — Z882 Allergy status to sulfonamides status: Secondary | ICD-10-CM | POA: Insufficient documentation

## 2020-07-25 DIAGNOSIS — I5022 Chronic systolic (congestive) heart failure: Secondary | ICD-10-CM | POA: Diagnosis not present

## 2020-07-25 DIAGNOSIS — I442 Atrioventricular block, complete: Secondary | ICD-10-CM | POA: Diagnosis present

## 2020-07-25 DIAGNOSIS — I517 Cardiomegaly: Secondary | ICD-10-CM | POA: Diagnosis not present

## 2020-07-25 DIAGNOSIS — I110 Hypertensive heart disease with heart failure: Secondary | ICD-10-CM | POA: Diagnosis not present

## 2020-07-25 DIAGNOSIS — I2109 ST elevation (STEMI) myocardial infarction involving other coronary artery of anterior wall: Secondary | ICD-10-CM | POA: Diagnosis not present

## 2020-07-25 DIAGNOSIS — M5030 Other cervical disc degeneration, unspecified cervical region: Secondary | ICD-10-CM | POA: Diagnosis present

## 2020-07-25 DIAGNOSIS — I361 Nonrheumatic tricuspid (valve) insufficiency: Secondary | ICD-10-CM | POA: Diagnosis not present

## 2020-07-25 DIAGNOSIS — I5043 Acute on chronic combined systolic (congestive) and diastolic (congestive) heart failure: Secondary | ICD-10-CM | POA: Diagnosis present

## 2020-07-25 DIAGNOSIS — J9601 Acute respiratory failure with hypoxia: Secondary | ICD-10-CM | POA: Diagnosis not present

## 2020-07-25 DIAGNOSIS — Z20822 Contact with and (suspected) exposure to covid-19: Secondary | ICD-10-CM | POA: Diagnosis present

## 2020-07-25 DIAGNOSIS — Z87891 Personal history of nicotine dependence: Secondary | ICD-10-CM | POA: Insufficient documentation

## 2020-07-25 DIAGNOSIS — E785 Hyperlipidemia, unspecified: Secondary | ICD-10-CM | POA: Diagnosis present

## 2020-07-25 DIAGNOSIS — M2012 Hallux valgus (acquired), left foot: Secondary | ICD-10-CM | POA: Insufficient documentation

## 2020-07-25 DIAGNOSIS — I5033 Acute on chronic diastolic (congestive) heart failure: Secondary | ICD-10-CM | POA: Diagnosis not present

## 2020-07-25 DIAGNOSIS — Z7983 Long term (current) use of bisphosphonates: Secondary | ICD-10-CM | POA: Insufficient documentation

## 2020-07-25 DIAGNOSIS — R069 Unspecified abnormalities of breathing: Secondary | ICD-10-CM | POA: Diagnosis not present

## 2020-07-25 DIAGNOSIS — I5021 Acute systolic (congestive) heart failure: Secondary | ICD-10-CM | POA: Diagnosis not present

## 2020-07-25 DIAGNOSIS — R0902 Hypoxemia: Secondary | ICD-10-CM | POA: Diagnosis not present

## 2020-07-25 DIAGNOSIS — Z85028 Personal history of other malignant neoplasm of stomach: Secondary | ICD-10-CM | POA: Diagnosis not present

## 2020-07-25 DIAGNOSIS — D631 Anemia in chronic kidney disease: Secondary | ICD-10-CM | POA: Diagnosis not present

## 2020-07-25 DIAGNOSIS — M329 Systemic lupus erythematosus, unspecified: Secondary | ICD-10-CM | POA: Diagnosis present

## 2020-07-25 DIAGNOSIS — K219 Gastro-esophageal reflux disease without esophagitis: Secondary | ICD-10-CM | POA: Diagnosis present

## 2020-07-25 DIAGNOSIS — I509 Heart failure, unspecified: Secondary | ICD-10-CM | POA: Diagnosis not present

## 2020-07-25 DIAGNOSIS — Z823 Family history of stroke: Secondary | ICD-10-CM | POA: Insufficient documentation

## 2020-07-25 DIAGNOSIS — Z8249 Family history of ischemic heart disease and other diseases of the circulatory system: Secondary | ICD-10-CM | POA: Insufficient documentation

## 2020-07-25 DIAGNOSIS — J449 Chronic obstructive pulmonary disease, unspecified: Secondary | ICD-10-CM | POA: Diagnosis not present

## 2020-07-25 DIAGNOSIS — I2510 Atherosclerotic heart disease of native coronary artery without angina pectoris: Secondary | ICD-10-CM | POA: Diagnosis present

## 2020-07-25 DIAGNOSIS — M810 Age-related osteoporosis without current pathological fracture: Secondary | ICD-10-CM | POA: Diagnosis present

## 2020-07-25 DIAGNOSIS — J9621 Acute and chronic respiratory failure with hypoxia: Secondary | ICD-10-CM | POA: Diagnosis present

## 2020-07-25 DIAGNOSIS — J440 Chronic obstructive pulmonary disease with acute lower respiratory infection: Secondary | ICD-10-CM | POA: Diagnosis present

## 2020-07-25 DIAGNOSIS — J189 Pneumonia, unspecified organism: Secondary | ICD-10-CM | POA: Diagnosis present

## 2020-07-25 DIAGNOSIS — J439 Emphysema, unspecified: Secondary | ICD-10-CM | POA: Diagnosis not present

## 2020-07-25 DIAGNOSIS — M3214 Glomerular disease in systemic lupus erythematosus: Secondary | ICD-10-CM | POA: Diagnosis present

## 2020-07-25 DIAGNOSIS — N179 Acute kidney failure, unspecified: Secondary | ICD-10-CM | POA: Diagnosis present

## 2020-07-25 DIAGNOSIS — N184 Chronic kidney disease, stage 4 (severe): Secondary | ICD-10-CM | POA: Diagnosis present

## 2020-07-25 DIAGNOSIS — I7300 Raynaud's syndrome without gangrene: Secondary | ICD-10-CM | POA: Diagnosis present

## 2020-07-25 DIAGNOSIS — I340 Nonrheumatic mitral (valve) insufficiency: Secondary | ICD-10-CM | POA: Diagnosis not present

## 2020-07-25 DIAGNOSIS — Z951 Presence of aortocoronary bypass graft: Secondary | ICD-10-CM | POA: Diagnosis not present

## 2020-07-25 DIAGNOSIS — C8330 Diffuse large B-cell lymphoma, unspecified site: Secondary | ICD-10-CM | POA: Diagnosis present

## 2020-07-25 HISTORY — PX: ARTHRODESIS METATARSALPHALANGEAL JOINT (MTPJ): SHX6566

## 2020-07-25 SURGERY — FUSION, JOINT, GREAT TOE
Anesthesia: General | Site: Toe | Laterality: Left

## 2020-07-25 MED ORDER — PROPOFOL 10 MG/ML IV BOLUS
INTRAVENOUS | Status: AC
  Filled 2020-07-25: qty 20

## 2020-07-25 MED ORDER — LIDOCAINE HCL (CARDIAC) PF 100 MG/5ML IV SOSY
PREFILLED_SYRINGE | INTRAVENOUS | Status: DC | PRN
  Administered 2020-07-25: 50 mg via INTRAVENOUS

## 2020-07-25 MED ORDER — PROPOFOL 10 MG/ML IV BOLUS
INTRAVENOUS | Status: DC | PRN
  Administered 2020-07-25: 100 mg via INTRAVENOUS

## 2020-07-25 MED ORDER — OXYCODONE-ACETAMINOPHEN 5-325 MG PO TABS: 1.0000 | ORAL_TABLET | Freq: Four times a day (QID) | ORAL | 0 refills | Status: AC | PRN

## 2020-07-25 MED ORDER — DEXAMETHASONE SODIUM PHOSPHATE 10 MG/ML IJ SOLN
INTRAMUSCULAR | Status: AC
  Filled 2020-07-25: qty 1

## 2020-07-25 MED ORDER — ONDANSETRON HCL 4 MG/2ML IJ SOLN
INTRAMUSCULAR | Status: AC
  Filled 2020-07-25: qty 2

## 2020-07-25 MED ORDER — BUPIVACAINE HCL (PF) 0.5 % IJ SOLN
INTRAMUSCULAR | Status: AC
  Filled 2020-07-25: qty 30

## 2020-07-25 MED ORDER — BUPIVACAINE HCL (PF) 0.25 % IJ SOLN
INTRAMUSCULAR | Status: AC
  Filled 2020-07-25: qty 30

## 2020-07-25 MED ORDER — METOCLOPRAMIDE HCL 10 MG PO TABS: 5.0000 mg | ORAL_TABLET | Freq: Three times a day (TID) | ORAL | Status: DC | PRN

## 2020-07-25 MED ORDER — CLINDAMYCIN PHOSPHATE 900 MG/50ML IV SOLN
900.0000 mg | INTRAVENOUS | Status: AC
  Administered 2020-07-25: 900 mg via INTRAVENOUS

## 2020-07-25 MED ORDER — EPHEDRINE 5 MG/ML INJ
INTRAVENOUS | Status: AC
  Filled 2020-07-25: qty 10

## 2020-07-25 MED ORDER — ONDANSETRON HCL 4 MG/2ML IJ SOLN: 4.0000 mg | Freq: Once | INTRAMUSCULAR | Status: DC | PRN

## 2020-07-25 MED ORDER — PHENYLEPHRINE HCL (PRESSORS) 10 MG/ML IV SOLN
INTRAVENOUS | Status: DC | PRN
  Administered 2020-07-25: 50 ug via INTRAVENOUS
  Administered 2020-07-25: 100 ug via INTRAVENOUS

## 2020-07-25 MED ORDER — ONDANSETRON HCL 4 MG/2ML IJ SOLN
INTRAMUSCULAR | Status: DC | PRN
  Administered 2020-07-25: 4 mg via INTRAVENOUS

## 2020-07-25 MED ORDER — CLINDAMYCIN PHOSPHATE 900 MG/50ML IV SOLN
INTRAVENOUS | Status: AC
  Filled 2020-07-25: qty 50

## 2020-07-25 MED ORDER — FENTANYL CITRATE (PF) 100 MCG/2ML IJ SOLN
INTRAMUSCULAR | Status: DC | PRN
  Administered 2020-07-25 (×2): 25 ug via INTRAVENOUS

## 2020-07-25 MED ORDER — LIDOCAINE HCL (PF) 2 % IJ SOLN
INTRAMUSCULAR | Status: AC
  Filled 2020-07-25: qty 5

## 2020-07-25 MED ORDER — DEXAMETHASONE SODIUM PHOSPHATE 10 MG/ML IJ SOLN
INTRAMUSCULAR | Status: DC | PRN
  Administered 2020-07-25: 5 mg via INTRAVENOUS

## 2020-07-25 MED ORDER — FAMOTIDINE 20 MG PO TABS
ORAL_TABLET | ORAL | Status: AC
  Filled 2020-07-25: qty 1

## 2020-07-25 MED ORDER — BUPIVACAINE LIPOSOME 1.3 % IJ SUSP
INTRAMUSCULAR | Status: DC | PRN
  Administered 2020-07-25 (×2): 10 mL

## 2020-07-25 MED ORDER — METOCLOPRAMIDE HCL 5 MG/ML IJ SOLN: 5.0000 mg | Freq: Three times a day (TID) | INTRAMUSCULAR | Status: DC | PRN

## 2020-07-25 MED ORDER — EPHEDRINE SULFATE 50 MG/ML IJ SOLN
INTRAMUSCULAR | Status: DC | PRN
  Administered 2020-07-25 (×3): 5 mg via INTRAVENOUS

## 2020-07-25 MED ORDER — GLYCOPYRROLATE 0.2 MG/ML IJ SOLN
INTRAMUSCULAR | Status: DC | PRN
  Administered 2020-07-25 (×2): .1 mg via INTRAVENOUS

## 2020-07-25 MED ORDER — PHENYLEPHRINE HCL (PRESSORS) 10 MG/ML IV SOLN
INTRAVENOUS | Status: AC
  Filled 2020-07-25: qty 1

## 2020-07-25 MED ORDER — CHLORHEXIDINE GLUCONATE 0.12 % MT SOLN
OROMUCOSAL | Status: AC
  Filled 2020-07-25: qty 15

## 2020-07-25 MED ORDER — FENTANYL CITRATE (PF) 100 MCG/2ML IJ SOLN
INTRAMUSCULAR | Status: AC
  Filled 2020-07-25: qty 2

## 2020-07-25 MED ORDER — MIDAZOLAM HCL 2 MG/2ML IJ SOLN
INTRAMUSCULAR | Status: AC
  Filled 2020-07-25: qty 2

## 2020-07-25 MED ORDER — MIDAZOLAM HCL 2 MG/2ML IJ SOLN
INTRAMUSCULAR | Status: DC | PRN
  Administered 2020-07-25: 1 mg via INTRAVENOUS

## 2020-07-25 MED ORDER — CHLORHEXIDINE GLUCONATE 0.12 % MT SOLN: 15.0000 mL | Freq: Once | OROMUCOSAL | Status: DC

## 2020-07-25 MED ORDER — ONDANSETRON HCL 4 MG PO TABS: 4.0000 mg | ORAL_TABLET | Freq: Four times a day (QID) | ORAL | Status: DC | PRN

## 2020-07-25 MED ORDER — ONDANSETRON HCL 4 MG/2ML IJ SOLN: 4.0000 mg | Freq: Four times a day (QID) | INTRAMUSCULAR | Status: DC | PRN

## 2020-07-25 MED ORDER — FENTANYL CITRATE (PF) 100 MCG/2ML IJ SOLN: 25.0000 ug | INTRAMUSCULAR | Status: DC | PRN

## 2020-07-25 MED ORDER — ORAL CARE MOUTH RINSE: 15.0000 mL | Freq: Once | OROMUCOSAL | Status: DC

## 2020-07-25 MED ORDER — LACTATED RINGERS IV SOLN: INTRAVENOUS | Status: DC

## 2020-07-25 MED ORDER — LIDOCAINE HCL (PF) 1 % IJ SOLN
INTRAMUSCULAR | Status: AC
  Filled 2020-07-25: qty 30

## 2020-07-25 SURGICAL SUPPLY — 59 items
BIT DRILL 2.9 CANN QC NONSTRL (BIT) ×1 IMPLANT
BIT DRILL CAL 2.5 ST W/SLV (BIT) ×1 IMPLANT
BLADE OSC/SAGITTAL MD 9X18.5 (BLADE) ×1 IMPLANT
BLADE SURG 15 STRL LF DISP TIS (BLADE) ×2 IMPLANT
BLADE SURG 15 STRL SS (BLADE) ×4
BLADE SURG MINI STRL (BLADE) ×2 IMPLANT
BNDG CMPR STD VLCR NS LF 5.8X4 (GAUZE/BANDAGES/DRESSINGS) ×1
BNDG CONFORM 2 STRL LF (GAUZE/BANDAGES/DRESSINGS) ×2 IMPLANT
BNDG CONFORM 3 STRL LF (GAUZE/BANDAGES/DRESSINGS) ×2 IMPLANT
BNDG ELASTIC 4X5.8 VLCR NS LF (GAUZE/BANDAGES/DRESSINGS) ×2 IMPLANT
BNDG ESMARK 4X12 TAN STRL LF (GAUZE/BANDAGES/DRESSINGS) ×2 IMPLANT
BNDG GAUZE 4.5X4.1 6PLY STRL (MISCELLANEOUS) ×2 IMPLANT
BOOT STEPPER DURA SM (SOFTGOODS) ×1 IMPLANT
COVER WAND RF STERILE (DRAPES) ×2 IMPLANT
CUFF TOURN SGL QUICK 12 (TOURNIQUET CUFF) IMPLANT
CUFF TOURN SGL QUICK 18X4 (TOURNIQUET CUFF) IMPLANT
DRAPE FLUOR MINI C-ARM 54X84 (DRAPES) ×1 IMPLANT
DURAPREP 26ML APPLICATOR (WOUND CARE) ×2 IMPLANT
ELECT REM PT RETURN 9FT ADLT (ELECTROSURGICAL) ×2
ELECTRODE REM PT RTRN 9FT ADLT (ELECTROSURGICAL) ×1 IMPLANT
GAUZE SPONGE 4X4 12PLY STRL (GAUZE/BANDAGES/DRESSINGS) ×2 IMPLANT
GAUZE XEROFORM 1X8 LF (GAUZE/BANDAGES/DRESSINGS) ×2 IMPLANT
GLOVE BIO SURGEON STRL SZ7.5 (GLOVE) ×2 IMPLANT
GLOVE INDICATOR 8.0 STRL GRN (GLOVE) ×2 IMPLANT
GOWN STRL REUS W/ TWL XL LVL3 (GOWN DISPOSABLE) ×2 IMPLANT
GOWN STRL REUS W/TWL XL LVL3 (GOWN DISPOSABLE) ×4
GRAFT TRIN ELITE MED MUSC TRAN (Graft) ×1 IMPLANT
K-WIRE 1.6 (WIRE) ×6
K-WIRE ACE 1.6X6 (WIRE) ×2
K-WIRE FX5X1.6XNS BN SS (WIRE) ×3
KIT TURNOVER KIT A (KITS) ×2 IMPLANT
KWIRE ACE 1.6X6 (WIRE) IMPLANT
KWIRE FX5X1.6XNS BN SS (WIRE) IMPLANT
LABEL OR SOLS (LABEL) ×2 IMPLANT
MANIFOLD NEPTUNE II (INSTRUMENTS) ×2 IMPLANT
NDL FILTER BLUNT 18X1 1/2 (NEEDLE) ×1 IMPLANT
NDL HYPO 25X1 1.5 SAFETY (NEEDLE) ×3 IMPLANT
NEEDLE FILTER BLUNT 18X 1/2SAF (NEEDLE) ×1
NEEDLE FILTER BLUNT 18X1 1/2 (NEEDLE) ×1 IMPLANT
NEEDLE HYPO 25X1 1.5 SAFETY (NEEDLE) ×6 IMPLANT
NS IRRIG 500ML POUR BTL (IV SOLUTION) ×2 IMPLANT
PACK EXTREMITY ARMC (MISCELLANEOUS) ×2 IMPLANT
PAD CAST CTTN 4X4 STRL (SOFTGOODS) ×1 IMPLANT
PADDING CAST COTTON 4X4 STRL (SOFTGOODS) ×2
PENCIL ELECTRO HAND CTR (MISCELLANEOUS) ×2 IMPLANT
PLATE F3 FRAG HI STR (Plate) ×3 IMPLANT
PLATE F3 FRAG HI STR Y (Plate) ×1 IMPLANT
SCREW MULTI DIRECT 18MM (Screw) ×1 IMPLANT
SCREW PEG LOCK 2.5X10 (Peg) ×2 IMPLANT
SCREW PEG LOCK 2.5X12 (Screw) ×2 IMPLANT
SCREW PEG LOCK 2.5X14 (Peg) ×1 IMPLANT
SCREW PEG LOCK 2.5X16 (Peg) ×2 IMPLANT
SCREW PEG LOCK 2.5X18 (Peg) ×2 IMPLANT
SCREW PEG LOCK 2.5X20 (Peg) ×1 IMPLANT
STOCKINETTE M/LG 89821 (MISCELLANEOUS) ×2 IMPLANT
STRIP CLOSURE SKIN 1/4X4 (GAUZE/BANDAGES/DRESSINGS) ×2 IMPLANT
SUT VIC AB 4-0 FS2 27 (SUTURE) ×2 IMPLANT
SYR 10ML LL (SYRINGE) ×4 IMPLANT
WIRE Z .062 C-WIRE SPADE TIP (WIRE) ×2 IMPLANT

## 2020-07-25 NOTE — Anesthesia Postprocedure Evaluation (Signed)
Anesthesia Post Note  Patient: Judy Torres  Procedure(s) Performed: ARTHRODESIS 1ST METATARSALPHALANGEAL JOINT (MTPJ) LEFT (Left Toe)  Patient location during evaluation: PACU Anesthesia Type: General Level of consciousness: awake and alert Pain management: pain level controlled Vital Signs Assessment: post-procedure vital signs reviewed and stable Respiratory status: spontaneous breathing and respiratory function stable Cardiovascular status: stable Anesthetic complications: no   No complications documented.   Last Vitals:  Vitals:   07/25/20 0812 07/25/20 1202  BP: (!) 135/54 109/70  Pulse: 88 94  Resp: 14 (!) 31  Temp: 36.5 C 37.1 C  SpO2: 100% 93%    Last Pain:  Vitals:   07/25/20 1202  TempSrc:   PainSc: 0-No pain                 Isabel Ardila K

## 2020-07-25 NOTE — Op Note (Signed)
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  Local consisted of 0.25% bupivacaine plain and Exparel long-acting anesthetic.  A total of 30 cc was used of the mixture    Hemostasis: Midcalf tourniquet inflated to 200 mmHg for 120 minutes    Specimen: None    Complications: Osteoporotic bone    Operative indications:[name] is an 75 y.o. that presents today for surgical intervention.  The risks/benefits/alternatives/complications have been discussed and consent has been given.    Procedure:  Patient was brought into the OR and placed on the operating table in thesupine position. After anesthesia was obtained theleft lower extremity was prepped and draped in usual sterile fashion.  Attention was directed to the dorsal aspect of the first MTPJ where a longitudinal incision was performed.  Sharp and blunt dissection carried down to the capsule.  Longitudinal capsulotomy was performed.  The head of the metatarsal and base of the proximal phalanx were then exposed.  At this time cartilage was removed with use of a curette.  A cup and cone reamer was used to prepare the joint initially.  Head of the metatarsal was prepared.  Base of the proximal phalanx is undergoing preparation with noted comminution occurred with preparation.  The dorsal one third of the proximal phalanx did comminute.  The wound was flushed with copious amounts of irrigation.  Remaining plantar two thirds of the metatarsal manual joint was then prepped down to subchondral bone and bleeding bone.  At this time a small dorsal plate from the F3 fragment set was placed dorsally.  Locking screws were placed proximal and distal to the arthrodesis site.  This was then supplemented with a medial linear locking plate from the F3  fragment set.  Good stability was noted in all planes and good realignment of the first MTPJ was noted on fluoroscopy.  The comminuted defect of the dorsal proximal phalanx was then packed with Symphony bone graft.  The wound was then closed with a 3-0 Vicryl the deeper and subcutaneous tissue and a 4-0 Monocryl for the skin.  Patient was placed in a mildly compressive dressing and placed in equalizer walker boot with the foot held at neutral 90 degrees.    Patient tolerated the procedure and anesthesia well.  Was transported from the OR to the PACU with all vital signs stable and vascular status intact. To be discharged per routine protocol.  Will follow up in approximately 1 week in the outpatient clinic.

## 2020-07-25 NOTE — Transfer of Care (Signed)
Immediate Anesthesia Transfer of Care Note  Patient: Judy Torres  Procedure(s) Performed: ARTHRODESIS 1ST METATARSALPHALANGEAL JOINT (MTPJ) LEFT (Left Toe)  Patient Location: PACU  Anesthesia Type:General  Level of Consciousness: awake  Airway & Oxygen Therapy: Patient Spontanous Breathing  Post-op Assessment: Report given to RN  Post vital signs: stable  Last Vitals:  Vitals Value Taken Time  BP 109/70 07/25/20 1202  Temp    Pulse 94 07/25/20 1205  Resp 28 07/25/20 1205  SpO2 91 % 07/25/20 1205  Vitals shown include unvalidated device data.  Last Pain:  Vitals:   07/25/20 0812  TempSrc: Oral  PainSc: 0-No pain         Complications: No complications documented.

## 2020-07-25 NOTE — Discharge Instructions (Addendum)
[place] DR. TROXLER, DR. Vickki Muff, AND DR. Mathews   1. Take your medication as prescribed.  Pain medication should be taken only as needed.  2. Keep the dressing clean, dry and intact.  3. Keep your foot elevated above the heart level for the first 48 hours.  4. We have instructed you to be non-weight bearing.  Always use your crutches if you are to be non-weight bearing.   (WALKER) 5. Do not take a shower. Baths are permissible as long as the foot is kept out of the water.   6. Every hour you are awake:  - Bend your knee 15 times. - Flex foot 15 times - Massage calf 15 times  7. Call [facility] [phone]) if any of the following problems occur: - You develop a temperature or fever. - The bandage becomes saturated with blood. - Medication does not stop your pain. - Injury of the foot occurs. - Any symptoms of infection including redness, odor, or red streaks running from wound.    Bupivacaine Liposomal Suspension for Injection  (WEAR GREEN EXPAREL BRACELET X 3 DAYS) What is this medicine? BUPIVACAINE LIPOSOMAL (bue PIV a kane LIP oh som al) is an anesthetic. It causes loss of feeling in the skin or other tissues. It is used to prevent and to treat pain from some procedures. This medicine may be used for other purposes; ask your health care provider or pharmacist if you have questions. COMMON BRAND NAME(S): EXPAREL What should I tell my health care provider before I take this medicine? They need to know if you have any of these conditions:  G6PD deficiency  heart disease  kidney disease  liver disease  low blood pressure  lung or breathing disease, like asthma  an unusual or allergic reaction to bupivacaine, other medicines, foods, dyes, or preservatives  pregnant or trying to get pregnant  breast-feeding How should I use this medicine? This  medicine is for injection into the affected area. It is given by a health care professional in a hospital or clinic setting. Talk to your pediatrician regarding the use of this medicine in children. Special care may be needed. Overdosage: If you think you have taken too much of this medicine contact a poison control center or emergency room at once. NOTE: This medicine is only for you. Do not share this medicine with others. What if I miss a dose? This does not apply. What may interact with this medicine? This medicine may interact with the following medications:  acetaminophen  certain antibiotics like dapsone, nitrofurantoin, aminosalicylic acid, sulfonamides  certain medicines for seizures like phenobarbital, phenytoin, valproic acid  chloroquine  cyclophosphamide  flutamide  hydroxyurea  ifosfamide  metoclopramide  nitric oxide  nitroglycerin  nitroprusside  nitrous oxide  other local anesthetics like lidocaine, pramoxine, tetracaine  primaquine  quinine  rasburicase  sulfasalazine This list may not describe all possible interactions. Give your health care provider a list of all the medicines, herbs, non-prescription drugs, or dietary supplements you use. Also tell them if you smoke, drink alcohol, or use illegal drugs. Some items may interact with your medicine. What should I watch for while using this medicine? Your condition will be monitored carefully while you are receiving this medicine. Be careful to avoid injury while the area is numb, and you are not aware of pain. What side effects may I notice from receiving this medicine? Side effects that  you should report to your doctor or health care professional as soon as possible:  allergic reactions like skin rash, itching or hives, swelling of the face, lips, or tongue  seizures  signs and symptoms of a dangerous change in heartbeat or heart rhythm like chest pain; dizziness; fast, irregular heartbeat;  palpitations; feeling faint or lightheaded; falls; breathing problems  signs and symptoms of methemoglobinemia such as pale, gray, or blue colored skin; headache; fast heartbeat; shortness of breath; feeling faint or lightheaded, falls; tiredness Side effects that usually do not require medical attention (report to your doctor or health care professional if they continue or are bothersome):  anxious  back pain  changes in taste  changes in vision  constipation  dizziness  fever  nausea, vomiting This list may not describe all possible side effects. Call your doctor for medical advice about side effects. You may report side effects to FDA at 1-800-FDA-1088. Where should I keep my medicine? This drug is given in a hospital or clinic and will not be stored at home. NOTE: This sheet is a summary. It may not cover all possible information. If you have questions about this medicine, talk to your doctor, pharmacist, or health care provider.  2020 Elsevier/Gold Standard (2019-04-17 10:48:23)      AMBULATORY SURGERY  DISCHARGE INSTRUCTIONS   1) The drugs that you were given will stay in your system until tomorrow so for the next 24 hours you should not:  A) Drive an automobile B) Make any legal decisions C) Drink any alcoholic beverage   2) You may resume regular meals tomorrow.  Today it is better to start with liquids and gradually work up to solid foods.  You may eat anything you prefer, but it is better to start with liquids, then soup and crackers, and gradually work up to solid foods.   3) Please notify your doctor immediately if you have any unusual bleeding, trouble breathing, redness and pain at the surgery site, drainage, fever, or pain not relieved by medication.    4) Additional Instructions:        Please contact your physician with any problems or Same Day Surgery at [phone], Monday through Friday 6 am to 4 pm, or [place] [facility]  number at [phone].

## 2020-07-25 NOTE — Anesthesia Preprocedure Evaluation (Signed)
Anesthesia Evaluation  Patient identified by MRN, date of birth, ID band Patient awake    Reviewed: Allergy & Precautions, NPO status , Patient's Chart, lab work & pertinent test results  History of Anesthesia Complications Negative for: history of anesthetic complications  Airway Mallampati: III       Dental  (+) Lower Dentures, Upper Dentures   Pulmonary neg sleep apnea, COPD,  COPD inhaler, Not current smoker, former smoker,           Cardiovascular hypertension, Pt. on medications (-) Past MI and (-) CHF + dysrhythmias + Valvular Problems/Murmurs (murmru, no tx)      Neuro/Psych neg Seizures    GI/Hepatic PUD, GERD  Medicated,  Endo/Other  neg diabetes  Renal/GU Renal InsufficiencyRenal disease     Musculoskeletal   Abdominal   Peds  Hematology   Anesthesia Other Findings   Reproductive/Obstetrics                             Anesthesia Physical Anesthesia Plan  ASA: III  Anesthesia Plan: General   Post-op Pain Management:    Induction: Intravenous  PONV Risk Score and Plan: 3 and Ondansetron, Dexamethasone and Treatment may vary due to age or medical condition  Airway Management Planned: LMA  Additional Equipment:   Intra-op Plan:   Post-operative Plan:   Informed Consent: I have reviewed the patients History and Physical, chart, labs and discussed the procedure including the risks, benefits and alternatives for the proposed anesthesia with the patient or authorized representative who has indicated his/her understanding and acceptance.       Plan Discussed with:   Anesthesia Plan Comments:         Anesthesia Quick Evaluation

## 2020-07-25 NOTE — Anesthesia Procedure Notes (Signed)
Procedure Name: LMA Insertion Date/Time: 07/25/2020 9:04 AM Performed by: Jerrye Noble, CRNA Pre-anesthesia Checklist: Patient identified, Emergency Drugs available, Suction available and Patient being monitored Patient Re-evaluated:Patient Re-evaluated prior to induction Oxygen Delivery Method: Circle system utilized Preoxygenation: Pre-oxygenation with 100% oxygen Induction Type: IV induction Ventilation: Mask ventilation without difficulty LMA: LMA inserted LMA Size: 4.0 Number of attempts: 1 Placement Confirmation: positive ETCO2 and CO2 detector Tube secured with: Tape Dental Injury: Teeth and Oropharynx as per pre-operative assessment

## 2020-07-28 ENCOUNTER — Emergency Department: Payer: Medicare HMO

## 2020-07-28 ENCOUNTER — Encounter: Admission: EM | Disposition: E | Payer: Self-pay | Source: Home / Self Care | Attending: Internal Medicine

## 2020-07-28 ENCOUNTER — Inpatient Hospital Stay: Admit: 2020-07-28 | Payer: Medicare HMO

## 2020-07-28 ENCOUNTER — Encounter: Payer: Self-pay | Admitting: Podiatry

## 2020-07-28 ENCOUNTER — Inpatient Hospital Stay
Admission: EM | Admit: 2020-07-28 | Discharge: 2020-08-19 | DRG: 246 | Disposition: E | Payer: Medicare HMO | Attending: Internal Medicine | Admitting: Internal Medicine

## 2020-07-28 ENCOUNTER — Inpatient Hospital Stay (HOSPITAL_COMMUNITY)
Admit: 2020-07-28 | Discharge: 2020-07-28 | Disposition: A | Discharge status: Home / Self Care | Payer: Medicare HMO | Attending: Internal Medicine | Admitting: Internal Medicine

## 2020-07-28 ENCOUNTER — Other Ambulatory Visit: Payer: Self-pay

## 2020-07-28 DIAGNOSIS — N184 Chronic kidney disease, stage 4 (severe): Secondary | ICD-10-CM | POA: Diagnosis present

## 2020-07-28 DIAGNOSIS — M810 Age-related osteoporosis without current pathological fracture: Secondary | ICD-10-CM | POA: Diagnosis present

## 2020-07-28 DIAGNOSIS — M3214 Glomerular disease in systemic lupus erythematosus: Secondary | ICD-10-CM | POA: Diagnosis present

## 2020-07-28 DIAGNOSIS — I7300 Raynaud's syndrome without gangrene: Secondary | ICD-10-CM | POA: Diagnosis present

## 2020-07-28 DIAGNOSIS — C8330 Diffuse large B-cell lymphoma, unspecified site: Secondary | ICD-10-CM | POA: Diagnosis present

## 2020-07-28 DIAGNOSIS — Z7983 Long term (current) use of bisphosphonates: Secondary | ICD-10-CM

## 2020-07-28 DIAGNOSIS — M5030 Other cervical disc degeneration, unspecified cervical region: Secondary | ICD-10-CM | POA: Diagnosis present

## 2020-07-28 DIAGNOSIS — I2510 Atherosclerotic heart disease of native coronary artery without angina pectoris: Secondary | ICD-10-CM | POA: Diagnosis present

## 2020-07-28 DIAGNOSIS — J440 Chronic obstructive pulmonary disease with acute lower respiratory infection: Secondary | ICD-10-CM | POA: Diagnosis present

## 2020-07-28 DIAGNOSIS — I340 Nonrheumatic mitral (valve) insufficiency: Secondary | ICD-10-CM

## 2020-07-28 DIAGNOSIS — Z95828 Presence of other vascular implants and grafts: Secondary | ICD-10-CM

## 2020-07-28 DIAGNOSIS — Z8249 Family history of ischemic heart disease and other diseases of the circulatory system: Secondary | ICD-10-CM

## 2020-07-28 DIAGNOSIS — D649 Anemia, unspecified: Secondary | ICD-10-CM | POA: Diagnosis not present

## 2020-07-28 DIAGNOSIS — I517 Cardiomegaly: Secondary | ICD-10-CM | POA: Diagnosis not present

## 2020-07-28 DIAGNOSIS — N179 Acute kidney failure, unspecified: Secondary | ICD-10-CM | POA: Diagnosis present

## 2020-07-28 DIAGNOSIS — Z79899 Other long term (current) drug therapy: Secondary | ICD-10-CM

## 2020-07-28 DIAGNOSIS — I5033 Acute on chronic diastolic (congestive) heart failure: Secondary | ICD-10-CM | POA: Diagnosis not present

## 2020-07-28 DIAGNOSIS — I442 Atrioventricular block, complete: Secondary | ICD-10-CM | POA: Diagnosis present

## 2020-07-28 DIAGNOSIS — Z66 Do not resuscitate: Secondary | ICD-10-CM | POA: Diagnosis not present

## 2020-07-28 DIAGNOSIS — I509 Heart failure, unspecified: Secondary | ICD-10-CM

## 2020-07-28 DIAGNOSIS — I214 Non-ST elevation (NSTEMI) myocardial infarction: Secondary | ICD-10-CM

## 2020-07-28 DIAGNOSIS — I130 Hypertensive heart and chronic kidney disease with heart failure and stage 1 through stage 4 chronic kidney disease, or unspecified chronic kidney disease: Secondary | ICD-10-CM | POA: Diagnosis present

## 2020-07-28 DIAGNOSIS — M1990 Unspecified osteoarthritis, unspecified site: Secondary | ICD-10-CM | POA: Diagnosis present

## 2020-07-28 DIAGNOSIS — I361 Nonrheumatic tricuspid (valve) insufficiency: Secondary | ICD-10-CM

## 2020-07-28 DIAGNOSIS — I5031 Acute diastolic (congestive) heart failure: Secondary | ICD-10-CM | POA: Diagnosis not present

## 2020-07-28 DIAGNOSIS — J189 Pneumonia, unspecified organism: Secondary | ICD-10-CM | POA: Diagnosis present

## 2020-07-28 DIAGNOSIS — J9621 Acute and chronic respiratory failure with hypoxia: Secondary | ICD-10-CM | POA: Diagnosis present

## 2020-07-28 DIAGNOSIS — Z888 Allergy status to other drugs, medicaments and biological substances status: Secondary | ICD-10-CM

## 2020-07-28 DIAGNOSIS — J449 Chronic obstructive pulmonary disease, unspecified: Secondary | ICD-10-CM | POA: Diagnosis not present

## 2020-07-28 DIAGNOSIS — I5043 Acute on chronic combined systolic (congestive) and diastolic (congestive) heart failure: Secondary | ICD-10-CM | POA: Diagnosis present

## 2020-07-28 DIAGNOSIS — I469 Cardiac arrest, cause unspecified: Secondary | ICD-10-CM | POA: Diagnosis not present

## 2020-07-28 DIAGNOSIS — K219 Gastro-esophageal reflux disease without esophagitis: Secondary | ICD-10-CM | POA: Diagnosis present

## 2020-07-28 DIAGNOSIS — Z87891 Personal history of nicotine dependence: Secondary | ICD-10-CM

## 2020-07-28 DIAGNOSIS — E538 Deficiency of other specified B group vitamins: Secondary | ICD-10-CM | POA: Diagnosis present

## 2020-07-28 DIAGNOSIS — Z4659 Encounter for fitting and adjustment of other gastrointestinal appliance and device: Secondary | ICD-10-CM

## 2020-07-28 DIAGNOSIS — Z85028 Personal history of other malignant neoplasm of stomach: Secondary | ICD-10-CM

## 2020-07-28 DIAGNOSIS — M329 Systemic lupus erythematosus, unspecified: Secondary | ICD-10-CM | POA: Diagnosis present

## 2020-07-28 DIAGNOSIS — Z7952 Long term (current) use of systemic steroids: Secondary | ICD-10-CM

## 2020-07-28 DIAGNOSIS — I083 Combined rheumatic disorders of mitral, aortic and tricuspid valves: Secondary | ICD-10-CM | POA: Diagnosis present

## 2020-07-28 DIAGNOSIS — M2012 Hallux valgus (acquired), left foot: Secondary | ICD-10-CM | POA: Diagnosis present

## 2020-07-28 DIAGNOSIS — D519 Vitamin B12 deficiency anemia, unspecified: Secondary | ICD-10-CM | POA: Diagnosis present

## 2020-07-28 DIAGNOSIS — J9601 Acute respiratory failure with hypoxia: Secondary | ICD-10-CM

## 2020-07-28 DIAGNOSIS — Z823 Family history of stroke: Secondary | ICD-10-CM

## 2020-07-28 DIAGNOSIS — I5021 Acute systolic (congestive) heart failure: Secondary | ICD-10-CM | POA: Diagnosis not present

## 2020-07-28 DIAGNOSIS — J9690 Respiratory failure, unspecified, unspecified whether with hypoxia or hypercapnia: Secondary | ICD-10-CM | POA: Diagnosis not present

## 2020-07-28 DIAGNOSIS — R54 Age-related physical debility: Secondary | ICD-10-CM | POA: Diagnosis present

## 2020-07-28 DIAGNOSIS — I255 Ischemic cardiomyopathy: Secondary | ICD-10-CM | POA: Diagnosis present

## 2020-07-28 DIAGNOSIS — R0902 Hypoxemia: Secondary | ICD-10-CM

## 2020-07-28 DIAGNOSIS — Z20822 Contact with and (suspected) exposure to covid-19: Secondary | ICD-10-CM | POA: Diagnosis present

## 2020-07-28 DIAGNOSIS — Z951 Presence of aortocoronary bypass graft: Secondary | ICD-10-CM | POA: Diagnosis not present

## 2020-07-28 DIAGNOSIS — I10 Essential (primary) hypertension: Secondary | ICD-10-CM | POA: Diagnosis present

## 2020-07-28 DIAGNOSIS — Z882 Allergy status to sulfonamides status: Secondary | ICD-10-CM

## 2020-07-28 DIAGNOSIS — J90 Pleural effusion, not elsewhere classified: Secondary | ICD-10-CM | POA: Diagnosis not present

## 2020-07-28 DIAGNOSIS — R0603 Acute respiratory distress: Secondary | ICD-10-CM

## 2020-07-28 DIAGNOSIS — Z01818 Encounter for other preprocedural examination: Secondary | ICD-10-CM

## 2020-07-28 DIAGNOSIS — Z9889 Other specified postprocedural states: Secondary | ICD-10-CM

## 2020-07-28 DIAGNOSIS — I5030 Unspecified diastolic (congestive) heart failure: Secondary | ICD-10-CM | POA: Diagnosis present

## 2020-07-28 DIAGNOSIS — R0602 Shortness of breath: Secondary | ICD-10-CM | POA: Diagnosis present

## 2020-07-28 DIAGNOSIS — E785 Hyperlipidemia, unspecified: Secondary | ICD-10-CM | POA: Diagnosis present

## 2020-07-28 DIAGNOSIS — Z880 Allergy status to penicillin: Secondary | ICD-10-CM

## 2020-07-28 HISTORY — PX: CORONARY/GRAFT ACUTE MI REVASCULARIZATION: CATH118305

## 2020-07-28 HISTORY — PX: LEFT HEART CATH AND CORONARY ANGIOGRAPHY: CATH118249

## 2020-07-28 LAB — CBC WITH DIFFERENTIAL/PLATELET
Abs Immature Granulocytes: 0.05 10*3/uL (ref 0.00–0.07)
Basophils Absolute: 0 10*3/uL (ref 0.0–0.1)
Basophils Relative: 0 %
Eosinophils Absolute: 0 10*3/uL (ref 0.0–0.5)
Eosinophils Relative: 0 %
HCT: 34.2 % — ABNORMAL LOW (ref 36.0–46.0)
Hemoglobin: 10.5 g/dL — ABNORMAL LOW (ref 12.0–15.0)
Immature Granulocytes: 1 %
Lymphocytes Relative: 13 %
Lymphs Abs: 1.4 10*3/uL (ref 0.7–4.0)
MCH: 30.7 pg (ref 26.0–34.0)
MCHC: 30.7 g/dL (ref 30.0–36.0)
MCV: 100 fL (ref 80.0–100.0)
Monocytes Absolute: 0.9 10*3/uL (ref 0.1–1.0)
Monocytes Relative: 8 %
Neutro Abs: 8.6 10*3/uL — ABNORMAL HIGH (ref 1.7–7.7)
Neutrophils Relative %: 78 %
Platelets: 181 10*3/uL (ref 150–400)
RBC: 3.42 MIL/uL — ABNORMAL LOW (ref 3.87–5.11)
RDW: 13.9 % (ref 11.5–15.5)
WBC: 10.9 10*3/uL — ABNORMAL HIGH (ref 4.0–10.5)
nRBC: 0 % (ref 0.0–0.2)

## 2020-07-28 LAB — COMPREHENSIVE METABOLIC PANEL
ALT: 13 U/L (ref 0–44)
AST: 54 U/L — ABNORMAL HIGH (ref 15–41)
Albumin: 3.7 g/dL (ref 3.5–5.0)
Alkaline Phosphatase: 60 U/L (ref 38–126)
Anion gap: 16 — ABNORMAL HIGH (ref 5–15)
BUN: 26 mg/dL — ABNORMAL HIGH (ref 8–23)
CO2: 24 mmol/L (ref 22–32)
Calcium: 8.9 mg/dL (ref 8.9–10.3)
Chloride: 99 mmol/L (ref 98–111)
Creatinine, Ser: 1.45 mg/dL — ABNORMAL HIGH (ref 0.44–1.00)
GFR, Estimated: 38 mL/min — ABNORMAL LOW (ref >60)
Glucose, Bld: 105 mg/dL — ABNORMAL HIGH (ref 70–99)
Potassium: 3.4 mmol/L — ABNORMAL LOW (ref 3.5–5.1)
Sodium: 139 mmol/L (ref 135–145)
Total Bilirubin: 0.8 mg/dL (ref 0.3–1.2)
Total Protein: 6.2 g/dL — ABNORMAL LOW (ref 6.5–8.1)

## 2020-07-28 LAB — PROTIME-INR
INR: 1 (ref 0.8–1.2)
Prothrombin Time: 12.8 seconds (ref 11.4–15.2)

## 2020-07-28 LAB — RESP PANEL BY RT-PCR (FLU A&B, COVID) ARPGX2
Influenza A by PCR: NEGATIVE
Influenza B by PCR: NEGATIVE
SARS Coronavirus 2 by RT PCR: NEGATIVE

## 2020-07-28 LAB — TROPONIN I (HIGH SENSITIVITY)
Troponin I (High Sensitivity): 9838 ng/L (ref <18)
Troponin I (High Sensitivity): 8440 ng/L (ref <18)

## 2020-07-28 LAB — POCT ACTIVATED CLOTTING TIME: Activated Clotting Time: 267 seconds

## 2020-07-28 LAB — LACTIC ACID, PLASMA
Lactic Acid, Venous: 2 mmol/L (ref 0.5–1.9)
Lactic Acid, Venous: 2.5 mmol/L (ref 0.5–1.9)

## 2020-07-28 LAB — APTT: aPTT: 24 seconds (ref 24–36)

## 2020-07-28 LAB — GLUCOSE, CAPILLARY: Glucose-Capillary: 134 mg/dL — ABNORMAL HIGH (ref 70–99)

## 2020-07-28 LAB — POC SARS CORONAVIRUS 2 AG -  ED: SARS Coronavirus 2 Ag: NEGATIVE

## 2020-07-28 LAB — BRAIN NATRIURETIC PEPTIDE: B Natriuretic Peptide: 3543.3 pg/mL — ABNORMAL HIGH (ref 0.0–100.0)

## 2020-07-28 LAB — PROCALCITONIN: Procalcitonin: <0.1 ng/mL

## 2020-07-28 LAB — HIV ANTIBODY (ROUTINE TESTING W REFLEX): HIV Screen 4th Generation wRfx: NONREACTIVE

## 2020-07-28 LAB — MRSA PCR SCREENING: MRSA by PCR: NEGATIVE

## 2020-07-28 SURGERY — CORONARY/GRAFT ACUTE MI REVASCULARIZATION
Anesthesia: Moderate Sedation

## 2020-07-28 MED ORDER — IPRATROPIUM-ALBUTEROL 0.5-2.5 (3) MG/3ML IN SOLN
3.0000 mL | Freq: Once | RESPIRATORY_TRACT | Status: AC
  Administered 2020-07-28: 3 mL via RESPIRATORY_TRACT
  Filled 2020-07-28: qty 3

## 2020-07-28 MED ORDER — ACETAMINOPHEN 325 MG PO TABS: 650.0000 mg | ORAL_TABLET | ORAL | Status: DC | PRN

## 2020-07-28 MED ORDER — LIDOCAINE HCL (PF) 1 % IJ SOLN
INTRAMUSCULAR | Status: DC | PRN
  Administered 2020-07-28: 2 mL

## 2020-07-28 MED ORDER — HEPARIN (PORCINE) 25000 UT/250ML-% IV SOLN
650.0000 [IU]/h | INTRAVENOUS | Status: DC
  Administered 2020-07-28: 650 [IU]/h via INTRAVENOUS
  Filled 2020-07-28: qty 250

## 2020-07-28 MED ORDER — ATROPINE SULFATE 1 MG/ML IJ SOLN
INTRAMUSCULAR | Status: DC | PRN
  Administered 2020-07-28 (×2): .5 mg via INTRAVENOUS

## 2020-07-28 MED ORDER — ONDANSETRON HCL 4 MG/2ML IJ SOLN: 4.0000 mg | Freq: Four times a day (QID) | INTRAMUSCULAR | Status: DC | PRN

## 2020-07-28 MED ORDER — LACTATED RINGERS IV BOLUS (SEPSIS): 500.0000 mL | Freq: Once | INTRAVENOUS | Status: DC

## 2020-07-28 MED ORDER — FENTANYL CITRATE (PF) 100 MCG/2ML IJ SOLN
INTRAMUSCULAR | Status: AC
  Administered 2020-07-28: 50 ug via INTRAVENOUS
  Filled 2020-07-28: qty 2

## 2020-07-28 MED ORDER — LIDOCAINE HCL (PF) 1 % IJ SOLN
INTRAMUSCULAR | Status: AC
  Filled 2020-07-28: qty 30

## 2020-07-28 MED ORDER — HYDROXYCHLOROQUINE SULFATE 200 MG PO TABS
200.0000 mg | ORAL_TABLET | Freq: Two times a day (BID) | ORAL | Status: DC
Start: 2020-07-28 — End: 2020-07-30
  Administered 2020-07-28 – 2020-07-30 (×4): 200 mg via ORAL
  Filled 2020-07-28 (×5): qty 1

## 2020-07-28 MED ORDER — BIVALIRUDIN TRIFLUOROACETATE 250 MG IV SOLR
INTRAVENOUS | Status: AC
  Filled 2020-07-28: qty 250

## 2020-07-28 MED ORDER — MORPHINE SULFATE (PF) 2 MG/ML IV SOLN
2.0000 mg | Freq: Once | INTRAVENOUS | Status: AC
  Administered 2020-07-28: 2 mg via INTRAVENOUS
  Filled 2020-07-28: qty 1

## 2020-07-28 MED ORDER — ONDANSETRON HCL 4 MG/2ML IJ SOLN
4.0000 mg | Freq: Once | INTRAMUSCULAR | Status: AC
  Administered 2020-07-28: 4 mg via INTRAVENOUS
  Filled 2020-07-28: qty 2

## 2020-07-28 MED ORDER — LACTATED RINGERS IV BOLUS (SEPSIS): 250.0000 mL | Freq: Once | INTRAVENOUS | Status: DC

## 2020-07-28 MED ORDER — MIDAZOLAM HCL 2 MG/2ML IJ SOLN
INTRAMUSCULAR | Status: AC
  Administered 2020-07-28: 2 mg
  Filled 2020-07-28: qty 2

## 2020-07-28 MED ORDER — FLUTICASONE PROPIONATE 50 MCG/ACT NA SUSP
1.0000 | Freq: Every day | NASAL | Status: DC | PRN
  Filled 2020-07-28: qty 16

## 2020-07-28 MED ORDER — METHYLPREDNISOLONE SODIUM SUCC 125 MG IJ SOLR
125.0000 mg | Freq: Once | INTRAMUSCULAR | Status: AC
  Administered 2020-07-28: 125 mg via INTRAVENOUS
  Filled 2020-07-28: qty 2

## 2020-07-28 MED ORDER — ASPIRIN 81 MG PO CHEW
324.0000 mg | CHEWABLE_TABLET | ORAL | Status: AC
  Administered 2020-07-28: 324 mg via ORAL
  Filled 2020-07-28: qty 4

## 2020-07-28 MED ORDER — HYDROCODONE-ACETAMINOPHEN 5-325 MG PO TABS
1.0000 | ORAL_TABLET | Freq: Two times a day (BID) | ORAL | Status: DC | PRN
  Administered 2020-07-28 – 2020-07-30 (×2): 1 via ORAL
  Filled 2020-07-28 (×2): qty 1

## 2020-07-28 MED ORDER — SODIUM CHLORIDE 0.9 % IV SOLN
INTRAVENOUS | Status: DC | PRN
  Administered 2020-07-28: 1 mg/kg/h via INTRAVENOUS

## 2020-07-28 MED ORDER — HEPARIN SODIUM (PORCINE) 5000 UNIT/ML IJ SOLN: 60.0000 [IU]/kg | Freq: Once | INTRAMUSCULAR | Status: DC

## 2020-07-28 MED ORDER — ASPIRIN 300 MG RE SUPP: 300.0000 mg | RECTAL | Status: AC

## 2020-07-28 MED ORDER — HEPARIN (PORCINE) 25000 UT/250ML-% IV SOLN
650.0000 [IU]/h | INTRAVENOUS | Status: DC
  Administered 2020-07-28 – 2020-07-30 (×2): 650 [IU]/h via INTRAVENOUS
  Filled 2020-07-28 (×2): qty 250

## 2020-07-28 MED ORDER — CHLORHEXIDINE GLUCONATE CLOTH 2 % EX PADS: 6.0000 | MEDICATED_PAD | Freq: Every day | CUTANEOUS | Status: DC

## 2020-07-28 MED ORDER — LACTATED RINGERS IV SOLN: INTRAVENOUS | Status: DC

## 2020-07-28 MED ORDER — FUROSEMIDE 10 MG/ML IJ SOLN
INTRAMUSCULAR | Status: AC
  Filled 2020-07-28: qty 4

## 2020-07-28 MED ORDER — HEPARIN (PORCINE) IN NACL 1000-0.9 UT/500ML-% IV SOLN
INTRAVENOUS | Status: DC | PRN
  Administered 2020-07-28: 500 mL

## 2020-07-28 MED ORDER — BIVALIRUDIN BOLUS VIA INFUSION - CUPID
INTRAVENOUS | Status: DC | PRN
  Administered 2020-07-28: 39.825 mg via INTRAVENOUS

## 2020-07-28 MED ORDER — FUROSEMIDE 10 MG/ML IJ SOLN
INTRAMUSCULAR | Status: DC | PRN
  Administered 2020-07-28: 20 mg via INTRAVENOUS

## 2020-07-28 MED ORDER — NITROGLYCERIN 0.4 MG SL SUBL: 0.4000 mg | SUBLINGUAL_TABLET | SUBLINGUAL | Status: DC | PRN

## 2020-07-28 MED ORDER — SIMVASTATIN 20 MG PO TABS
40.0000 mg | ORAL_TABLET | Freq: Every evening | ORAL | Status: DC
Start: 2020-07-28 — End: 2020-07-30
  Administered 2020-07-28 – 2020-07-29 (×2): 40 mg via ORAL
  Filled 2020-07-28 (×2): qty 2

## 2020-07-28 MED ORDER — ASPIRIN EC 81 MG PO TBEC
81.0000 mg | DELAYED_RELEASE_TABLET | Freq: Every day | ORAL | Status: DC
  Administered 2020-07-29 – 2020-07-30 (×2): 81 mg via ORAL
  Filled 2020-07-28 (×2): qty 1

## 2020-07-28 MED ORDER — LACTATED RINGERS IV BOLUS (SEPSIS)
1000.0000 mL | Freq: Once | INTRAVENOUS | Status: AC
  Administered 2020-07-28: 1000 mL via INTRAVENOUS

## 2020-07-28 MED ORDER — HEPARIN BOLUS VIA INFUSION
3200.0000 [IU] | Freq: Once | INTRAVENOUS | Status: AC
  Administered 2020-07-28: 3200 [IU] via INTRAVENOUS
  Filled 2020-07-28: qty 3200

## 2020-07-28 MED ORDER — LEVOFLOXACIN IN D5W 750 MG/150ML IV SOLN
750.0000 mg | Freq: Once | INTRAVENOUS | Status: AC
  Administered 2020-07-28: 750 mg via INTRAVENOUS
  Filled 2020-07-28: qty 150

## 2020-07-28 MED ORDER — FUROSEMIDE 10 MG/ML IJ SOLN
20.0000 mg | Freq: Once | INTRAMUSCULAR | Status: AC
  Administered 2020-07-28: 20 mg via INTRAVENOUS
  Filled 2020-07-28: qty 4

## 2020-07-28 MED ORDER — IOHEXOL 300 MG/ML  SOLN
INTRAMUSCULAR | Status: DC | PRN
  Administered 2020-07-28: 200 mL

## 2020-07-28 MED ORDER — HEPARIN (PORCINE) 25000 UT/250ML-% IV SOLN: 14.0000 [IU]/kg/h | INTRAVENOUS | Status: DC

## 2020-07-28 MED ORDER — PREDNISONE 10 MG PO TABS
5.0000 mg | ORAL_TABLET | Freq: Every day | ORAL | Status: DC
Start: 2020-07-28 — End: 2020-07-30
  Administered 2020-07-28 – 2020-07-30 (×2): 5 mg via ORAL
  Filled 2020-07-28 (×3): qty 1

## 2020-07-28 MED ORDER — FENTANYL CITRATE (PF) 100 MCG/2ML IJ SOLN: 50.0000 ug | Freq: Once | INTRAMUSCULAR | Status: AC

## 2020-07-28 SURGICAL SUPPLY — 21 items
CANNULA 5F STIFF (CANNULA) ×1 IMPLANT
CATH INFINITI 5 FR IM (CATHETERS) ×1 IMPLANT
CATH INFINITI 5 FR JL3.5 (CATHETERS) ×1 IMPLANT
CATH INFINITI 5FR JL4 (CATHETERS) ×1 IMPLANT
CATH INFINITI JR4 5F (CATHETERS) ×1 IMPLANT
CATH VISTA GUIDE 6FR JR4 (CATHETERS) ×1 IMPLANT
DEVICE CLOSURE MYNXGRIP 6/7F (Vascular Products) ×1 IMPLANT
GLIDESHEATH SLEND SS 6F .021 (SHEATH) ×1 IMPLANT
GUIDEWIRE INQWIRE 1.5J.035X260 (WIRE) IMPLANT
INQWIRE 1.5J .035X260CM (WIRE) ×2
KIT ENCORE 26 ADVANTAGE (KITS) ×1 IMPLANT
KIT MANI 3VAL PERCEP (MISCELLANEOUS) ×2 IMPLANT
NDL PERC 18GX7CM (NEEDLE) IMPLANT
NEEDLE PERC 18GX7CM (NEEDLE) ×2 IMPLANT
PACK CARDIAC CATH (CUSTOM PROCEDURE TRAY) ×2 IMPLANT
SHEATH AVANTI 6FR X 11CM (SHEATH) ×1 IMPLANT
WIRE ASAHI GRAND SLAM 180CM (WIRE) ×1 IMPLANT
WIRE G HI TQ BMW 190 (WIRE) ×1 IMPLANT
WIRE GUIDERIGHT .035X150 (WIRE) ×1 IMPLANT
WIRE HI TORQ WHISPER MS 190CM (WIRE) ×1 IMPLANT
WIRE HITORQ VERSACORE ST 145CM (WIRE) ×1 IMPLANT

## 2020-07-28 NOTE — ED Notes (Signed)
Pt remains showing VTACH on monitor. MD at bedside

## 2020-07-28 NOTE — Plan of Care (Signed)
  Problem: Health Behavior/Discharge Planning: Goal: Ability to manage health-related needs will improve Outcome: Progressing   Problem: Activity: Goal: Risk for activity intolerance will decrease Outcome: Progressing   Problem: Nutrition: Goal: Adequate nutrition will be maintained Outcome: Progressing   

## 2020-07-28 NOTE — ED Notes (Signed)
Pt showing NSR. HR remains 120s

## 2020-07-28 NOTE — Progress Notes (Signed)
[place] for Heparin Infusion Indication: chest pain/ACS /STEMI   Allergies  Allergen Reactions  . Penicillins Other (See Comments)    Pass out Has patient had a PCN reaction causing immediate rash, facial/tongue/throat swelling, SOB or lightheadedness with hypotension: Yes Has patient had a PCN reaction causing severe rash involving mucus membranes or skin necrosis: No Has patient had a PCN reaction that required hospitalization: No Has patient had a PCN reaction occurring within the last 10 years: No If all of the above answers are "NO", then may proceed with Cephalosporin use.   . Ace Inhibitors Nausea Only  . Losartan Anxiety  . Spironolactone Other (See Comments)    Felt poorly but couldn't clarify   . Sulfa Antibiotics Nausea Only  . Sulfasalazine Nausea And Vomiting         Patient Measurements: Height: 4\' 11"  (149.9 cm) Weight: 53.1 kg (117 lb) IBW/kg (Calculated) : 43.2 Heparin Dosing Weight: 53.1kg  Vital Signs: Temp: 98.9 F (37.2 C) (01/10 0259) Temp Source: Oral (01/10 0259) BP: 106/74 (01/10 0600) Pulse Rate: 149 (01/10 0600)  Labs: Recent Labs    07/29/2020 0317 08/02/2020 0500  HGB 10.5*  --   HCT 34.2*  --   PLT 181  --   APTT  --  24  LABPROT  --  12.8  INR  --  1.0  CREATININE 1.45*  --   TROPONINIHS 1,950* 8,440*    Estimated Creatinine Clearance: 26.1 mL/min (A) (by C-G formula based on SCr of 1.45 mg/dL (H)).   Medical History: Past Medical History:  Diagnosis Date  . Anemia    VITAMIN B12 DEFICIENCY  . Arthritis   . Cancer [facility])    Stomach cancer  . Cardiomyopathy ([place])   . CHF (congestive heart failure) ([place])   . Chronic kidney disease    STAGE IV KIDNEY DISEASE  . COPD (chronic obstructive pulmonary disease) ([place])   . DDD (degenerative disc disease), cervical   . GERD (gastroesophageal reflux disease)   . HTN (hypertension)   . Hyperlipidemia   . Lupus ([place])   . 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 Peripheral vascular disease ([place])   . Polyp of ascending colon 05/2020   several small polyps (precancerous) removed.  . Raynaud's disease   . S/P angioplasty with stent   . Wears dentures    full upper and lower   Pt is 75 yo female presenting to ED with acute respiratory distress.  Pt hx  medical history significant for SLE, diffuse large B-cell lymphoma followed by Dr. Mike Gip, HTN, CAD s/p CABG, COPD, diastolic heart failure, who is status post recent great toe surgery on 07/25/20.   Goal of Therapy:  Heparin level 0.3-0.7 units/ml Monitor platelets by anticoagulation protocol: Yes   Plan:  Ordered baseline INR and aPTT. Give 3200 units bolus x 1 Start heparin infusion at 650 units/hr Check anti-Xa level in 8 hours and daily while on heparin Continue to monitor H&H and platelets  Renda Rolls, PharmD, [facility] 08/11/2020 6:47 AM

## 2020-07-28 NOTE — ED Notes (Signed)
Shocked delivered at 120j. MD at bedside

## 2020-07-28 NOTE — Progress Notes (Signed)
Rounded on patient with Dr. Rockey Situ post cath.  She was resting in bed comfortably and without complaint.  Husband at bedside.  Await Dr. Etta Quill consult note. [facility] to take over care 1/11.  Discussed with Dr. Rockey Situ.

## 2020-07-28 NOTE — ED Triage Notes (Signed)
Patient to ED for shortness of breath.  Patient had toe surgery on Friday and now with shortness of breath.  EMS reported pulse oxi 83% on room air at home, up into 90's % after treatment and placed on oxygen.

## 2020-07-28 NOTE — Op Note (Signed)
[facility]   OPERATIVE NOTE  DATE: 07/23/2020  PRE-OPERATIVE DIAGNOSIS: Difficult vascular access in a patient needing cardiac catheterization for cardiac event  POST-OPERATIVE DIAGNOSIS: same as above  PROCEDURE: 1.   Ultrasound guidance for vascular access right femoral artery with right femoral sheath placement  SURGEON: Leotis Pain  ASSISTANT(S): None  ANESTHESIA: Local with sedation  ESTIMATED BLOOD LOSS: Minimal  FINDING(S): 1.  Patent right femoral artery with right to left femoral to femoral bypass   INDICATIONS:   [name] is a 75 y.o. female who presents with a cardiac event needing cardiac catheterization.  She has extensive vascular history with previous aneurysm repair with a stent graft involving an aorto uniiliac device and a femoral to femoral bypass right to left.  The cardiologist had difficulty due to the bypass and the extensive scar tissue and we were asked to come over and assist with vascular access.   DESCRIPTION: After obtaining full informed written consent, the patient was brought back to the operating room and placed supine upon the operating table.  The patient was prepped and draped in the standard fashion.  The right femoral artery was visualized and found to be patent.  I then visualized the bypass graft and stopped just beside the right femoral artery to bypass anastomosis in the common femoral artery.  This was done with a micropuncture needle and a permanent image was recorded.  A micropuncture wire and sheath were then placed.  We then upsized to a 6 [place] sheath.  I then turned the table over to Dr. Clayborn Bigness from cardiology for the cardiac portion of the procedure   COMPLICATIONS: None  CONDITION: Stable  Leotis Pain  07/19/2020, 9:57 AM    This note was created with Dragon Medical transcription system. Any errors in dictation are purely unintentional.

## 2020-07-28 NOTE — Progress Notes (Signed)
?  Responded to code stemi to relieve Ch Cora. I met Pt husband and escorted him to Cath lab. Pt husband left to go home and change clothes. Ch will follow up.    ?Ch responded to Pg from ED nurse. Pt husband had return. I escorted him back to the Cath lab. Ch will follow up.   ?Ch escorted Pt to ICU waiting room. Let ICU nurse know that Pt wife was in waiting room. Ch will follow up later.

## 2020-07-28 NOTE — ED Notes (Signed)
Husband came out of room yelling for help. This RN entered room to find pt slumped down in bed SOB, tachycardic and diaphoretic. MD Siadecki at bedside. Pt placed back on monitor. O2 reading 79%, RR-35. Pt instructed to take slow deep breaths. Nonrebreather placed on pt at 15 L. O2 improved to 98%, RR-35.  MD Siadecki messaging ADmit MD for orders for BIPAP.

## 2020-07-28 NOTE — ED Notes (Signed)
Report given to Megan, RN.

## 2020-07-28 NOTE — Consult Note (Signed)
Brief interventional cardiology consult Vitals blood pressure 110/70 heart rate of 90 respiratory rate of 20 on nonrebreather sats of 95% afebrile patient awake and alert slightly sedated Called to see the patient as a code STEMI after she suffered what appeared to be a non-STEMI with severely elevated troponins evidence of transient complete heart block as well as heart failure Patient has known multivessel coronary disease including coronary bypass surgery as well as peripheral vascular disease including aorta bifemoral and right to left femorofemoral bypass Patient has lupus and significant stage IV renal insufficiency Patient has evidence of heart failure and was short of breath elevated BNP Patient was brought to the cardiac Cath Lab Patient had difficult access issues were unable to get up on the right initially or radially With the help of vascular rapid access the right graft Diagnostic cath Totally occluded left main distally RCA tandem 99% lesions in the mid segment with TIMI I flow probably CTO Grafts SVG to presumed OM was 100% occluded LIMA to mid LAD widely patent and appears to supply the entire heart via collaterals Extensive collaterals to circumflex distal RCA Hand-injection LV gram showed globally preserved ventricular function less than 25% Intervention Attempted intervention of mid RCA Unsuccessful unable to cross with a wire presumed CTO Right groin sealed with minx Patient given Lasix IV Transported to intensive care for medical management Cardiology is being transferred back to Dr. Rockey Situ Family and hospitalist notified Full consult and cath note to follow

## 2020-07-28 NOTE — ED Notes (Signed)
Dr. Marcelline Deist at bedside

## 2020-07-28 NOTE — Progress Notes (Signed)
[place] for Heparin Infusion Indication: chest pain/ACS /STEMI   Allergies  Allergen Reactions  . Penicillins Other (See Comments)    Pass out Has patient had a PCN reaction causing immediate rash, facial/tongue/throat swelling, SOB or lightheadedness with hypotension: Yes Has patient had a PCN reaction causing severe rash involving mucus membranes or skin necrosis: No Has patient had a PCN reaction that required hospitalization: No Has patient had a PCN reaction occurring within the last 10 years: No If all of the above answers are "NO", then may proceed with Cephalosporin use.   . Ace Inhibitors Nausea Only  . Losartan Anxiety  . Spironolactone Other (See Comments)    Felt poorly but couldn't clarify   . Sulfa Antibiotics Nausea Only  . Sulfasalazine Nausea And Vomiting         Patient Measurements: Height: 4\' 11"  (149.9 cm) Weight: 53.1 kg (117 lb) IBW/kg (Calculated) : 43.2 Heparin Dosing Weight: 53.1kg  Vital Signs: Temp: 98.9 F (37.2 C) (01/10 0259) Temp Source: Oral (01/10 0259) BP: 118/80 (01/10 0808) Pulse Rate: 38 (01/10 0730)  Labs: Recent Labs    08/07/2020 0317 07/27/2020 0500  HGB 10.5*  --   HCT 34.2*  --   PLT 181  --   APTT  --  24  LABPROT  --  12.8  INR  --  1.0  CREATININE 1.45*  --   TROPONINIHS 8,315* 8,440*    Estimated Creatinine Clearance: 26.1 mL/min (A) (by C-G formula based on SCr of 1.45 mg/dL (H)).   Medical History: Past Medical History:  Diagnosis Date  . Anemia    VITAMIN B12 DEFICIENCY  . Arthritis   . Cancer [facility])    Stomach cancer  . Cardiomyopathy ([place])   . CHF (congestive heart failure) ([place])   . Chronic kidney disease    STAGE IV KIDNEY DISEASE  . COPD (chronic obstructive pulmonary disease) ([place])   . DDD (degenerative disc disease), cervical   . GERD (gastroesophageal reflux disease)   . HTN (hypertension)   . Hyperlipidemia   . Lupus ([place])   . 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 Peripheral vascular disease ([place])   . Polyp of ascending colon 05/2020   several small polyps (precancerous) removed.  . Raynaud's disease   . S/P angioplasty with stent   . Wears dentures    full upper and lower   Pt is 75 yo female presenting to ED with acute respiratory distress.  Pt hx  medical history significant for SLE, diffuse large B-cell lymphoma followed by Dr. Mike Gip, HTN, CAD s/p CABG, COPD, diastolic heart failure, who is status post recent great toe surgery on 07/25/20.   Goal of Therapy:  Heparin level 0.3-0.7 units/ml Monitor platelets by anticoagulation protocol: Yes   Plan:  Patient s/p cardiac cath. Per consult, restart heparin at 1500. Will plan to restart at previous rate of 650 units/hr. Check HL at 2300. CBC with morning labs.  Dorena Bodo, PharmD 08/05/2020 11:53 AM

## 2020-07-28 NOTE — Progress Notes (Signed)
CODE SEPSIS - PHARMACY COMMUNICATION  **Broad Spectrum Antibiotics should be administered within 1 hour of Sepsis diagnosis**  Time Code Sepsis Called/Page Received: 5945  Antibiotics Ordered: Levaquin  Time of 1st antibiotic administration: [place], PharmD, [facility] 08/05/2020 4:18 AM

## 2020-07-28 NOTE — H&P (Addendum)
History and Physical    [name] TKP:[id number] DOB: 08-17-1945 DOA: 07/21/2020  PCP: [name], MD   Patient coming from: Home  I have personally briefly reviewed patient's old medical records in [place]  Chief Complaint: Respiratory distress  HPI: [name] is a 75 y.o. female with medical history significant for SLE, diffuse large B-cell lymphoma followed by Dr. Mike Gip, HTN, CAD s/p CABG, COPD, diastolic heart failure, who is status post recent great toe surgery by Dr. Vickki Muff on 07/25/2020, who presents to the emergency room with acute respiratory distress with EMS reporting O2 sats 83% on room air requiring 3 L to get sats to 97%.  Reports some chest tightness on exertion while using the knee scooter to move around.  States the pain started the day after the surgery and seem to get worse with each progressive day every time she tried to move around.  So pain was exertional went from mild to moderate to severe, relieved with rest but then became associated with shortness of breath.  No associated nausea vomiting or diaphoresis or lightheadedness she has no pain at this time.  Patient denies fever or chills .  Has no abdominal pain, vomiting diarrhea or nausea. ED course: On arrival, afebrile, tachycardic at 102 and tachypneic at 22 with O2 sat 97% on 2 L.  BP 105/71.  Blood work significant for WBC of 11,000 with lactic acid of 2.  Creatinine 1.45 above baseline of 0.98 within the past year.  Troponin resulted at 9838 and BNP  3543.  Procalcitonin less than 0.10.  COVID antigen test negative. EKG as interpreted by me: Normal sinus with nonspecific ST-T wave changes unchanged from past EKGs Chest x-ray: New airspace opacities in the right lower lobe concerning for pneumonia, mild interstitial edema.  Patient started on heparin infusion was given a small fluid bolus for possible sepsis related to pneumonia.  Hospitalist consulted for admission.   Review of Systems:  As per HPI otherwise all other systems on review of systems negative.    Past Medical History:  Diagnosis Date  . Anemia    VITAMIN B12 DEFICIENCY  . Arthritis   . Cancer [facility])    Stomach cancer  . Cardiomyopathy ([place])   . CHF (congestive heart failure) ([place])   . Chronic kidney disease    STAGE IV KIDNEY DISEASE  . COPD (chronic obstructive pulmonary disease) ([place])   . DDD (degenerative disc disease), cervical   . GERD (gastroesophageal reflux disease)   . HTN (hypertension)   . Hyperlipidemia   . Lupus ([place])   . 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 Peripheral vascular disease ([place])   . Polyp of ascending colon 05/2020   several small polyps (precancerous) removed.  . Raynaud's disease   . S/P angioplasty with stent   . Wears dentures    full upper and lower    Past Surgical History:  Procedure Laterality Date  . ABDOMINAL HYSTERECTOMY    . AORTO-FEMORAL BYPASS GRAFT Left 2003  . ARTHRODESIS METATARSALPHALANGEAL JOINT (MTPJ) Left 07/25/2020   Procedure: ARTHRODESIS 1ST METATARSALPHALANGEAL JOINT (MTPJ) LEFT;  Surgeon: Samara Deist, DPM;  Location: [facility] ORS;  Service: Podiatry;  Laterality: Left;  . CARDIAC CATHETERIZATION     stent  . CARDIAC SURGERY    . CATARACT EXTRACTION W/PHACO Right 11/15/2019   Procedure: CATARACT EXTRACTION PHACO AND INTRAOCULAR LENS PLACEMENT (IOC) RIGHT  VISION BLUE 7.48 00:45.8;  Surgeon: Marchia Meiers, MD;  Location:  [place];  Service: Ophthalmology;  Laterality: Right;  Port a cath  . CATARACT EXTRACTION W/PHACO Left 12/20/2019   Procedure: CATARACT EXTRACTION PHACO AND INTRAOCULAR LENS PLACEMENT ([place]) LEFT VISION BLUE;  Surgeon: Marchia Meiers, MD;  Location: [place];  Service: Ophthalmology;  Laterality: Left;  6.170:[id number]  . CESAREAN SECTION    . COLONOSCOPY N/A 12/30/2017   Procedure: COLONOSCOPY;  Surgeon: Lin Landsman, MD;  Location: [facility] ENDOSCOPY;  Service: Gastroenterology;  Laterality: N/A;  .  COLONOSCOPY WITH PROPOFOL N/A 05/23/2020   Procedure: COLONOSCOPY WITH PROPOFOL;  Surgeon: Virgel Manifold, MD;  Location: [facility] ENDOSCOPY;  Service: Endoscopy;  Laterality: N/A;  . CORONARY ARTERY BYPASS GRAFT  2004  . CT SIM FOR RADIATION THERAPY ([place] [facility])  2020  . ESOPHAGOGASTRODUODENOSCOPY N/A 12/30/2017   Procedure: ESOPHAGOGASTRODUODENOSCOPY (EGD);  Surgeon: Lin Landsman, MD;  Location: [facility] ENDOSCOPY;  Service: Gastroenterology;  Laterality: N/A;  . ESOPHAGOGASTRODUODENOSCOPY Left 02/05/2018   Procedure: ESOPHAGOGASTRODUODENOSCOPY (EGD);  Surgeon: Virgel Manifold, MD;  Location: [facility] ENDOSCOPY;  Service: Endoscopy;  Laterality: Left;  . ESOPHAGOGASTRODUODENOSCOPY (EGD) WITH PROPOFOL N/A 03/15/2018   Procedure: ESOPHAGOGASTRODUODENOSCOPY (EGD) WITH PROPOFOL;  Surgeon: Virgel Manifold, MD;  Location: [facility] ENDOSCOPY;  Service: Endoscopy;  Laterality: N/A;  . EYE SURGERY    . FLEXIBLE SIGMOIDOSCOPY N/A 12/31/2017   Procedure: FLEXIBLE SIGMOIDOSCOPY;  Surgeon: Jonathon Bellows, MD;  Location: [facility] ENDOSCOPY;  Service: Gastroenterology;  Laterality: N/A;  . PORTA CATH INSERTION N/A 04/19/2018   Procedure: PORTA CATH INSERTION;  Surgeon: Algernon Huxley, MD;  Location: [place] CV LAB;  Service: Cardiovascular;  Laterality: N/A;  . PORTA CATH REMOVAL N/A 02/28/2020   Procedure: PORTA CATH REMOVAL;  Surgeon: Algernon Huxley, MD;  Location: [place] CV LAB;  Service: Cardiovascular;  Laterality: N/A;     reports that she quit smoking about 18 years ago. Her smoking use included cigarettes. She has a 10.00 pack-year smoking history. She has never used smokeless tobacco. She reports previous alcohol use. She reports that she does not use drugs.  Allergies  Allergen Reactions  . Penicillins Other (See Comments)    Pass out Has patient had a PCN reaction causing immediate rash, facial/tongue/throat swelling, SOB or lightheadedness with hypotension: Yes Has patient had a PCN reaction  causing severe rash involving mucus membranes or skin necrosis: No Has patient had a PCN reaction that required hospitalization: No Has patient had a PCN reaction occurring within the last 10 years: No If all of the above answers are "NO", then may proceed with Cephalosporin use.   . Ace Inhibitors Nausea Only  . Losartan Anxiety  . Spironolactone Other (See Comments)    Felt poorly but couldn't clarify   . Sulfa Antibiotics Nausea Only  . Sulfasalazine Nausea And Vomiting         Family History  Problem Relation Age of Onset  . Hypertension Mother   . Stroke Mother   . Vascular Disease Mother   . Hypertension Sister       Prior to Admission medications   Medication Sig Start Date End Date Taking? Authorizing Provider  acetaminophen (TYLENOL) 500 MG tablet Take 1,000 mg by mouth 2 (two) times daily as needed for moderate pain or headache.    [provider]  alendronate (FOSAMAX) 70 MG tablet Take 70 mg by mouth every Saturday.     [provider]  amLODipine (NORVASC) 10 MG tablet Take 10 mg by mouth daily. 08/15/18   [provider]  fluticasone (FLONASE) 50 MCG/ACT nasal spray Place 1 spray into both nostrils daily as needed for allergies or rhinitis.    [provider]  HYDROcodone-acetaminophen (NORCO/VICODIN) 5-325 MG tablet Take 1 tablet by mouth 2 (two) times daily as needed (pain.). 06/04/20   [provider]  hydroxychloroquine (PLAQUENIL) 200 MG tablet Take 200 mg by mouth 2 (two) times daily.  01/22/14   [provider]  oxyCODONE-acetaminophen (PERCOCET) 5-325 MG tablet Take 1-2 tablets by mouth every 6 (six) hours as needed for severe pain. Max 6 tabs per day 07/25/20   Samara Deist, DPM  polyethylene glycol [facility] / GLYCOLAX) packet Take 17 g by mouth daily.    [provider]  predniSONE (DELTASONE) 5 MG tablet Take 5 mg by mouth daily with breakfast.  12/12/13   [provider]   Pseudoephedrine-APAP 30-325 MG TABS Take 1 tablet by mouth daily as needed (sinuses).     [provider]  simvastatin (ZOCOR) 40 MG tablet Take 40 mg by mouth every evening. 08/15/14   [provider]    Physical Exam: Vitals:   08/01/2020 0259 08/01/2020 0300  BP: 105/71   Pulse: (!) 102   Resp: (!) 22   Temp: 98.9 F (37.2 C)   TempSrc: Oral   SpO2: 97%   Weight:  53.1 kg  Height:  4\' 11"  (1.499 m)     Vitals:   08/06/2020 0259 07/26/2020 0300  BP: 105/71   Pulse: (!) 102   Resp: (!) 22   Temp: 98.9 F (37.2 C)   TempSrc: Oral   SpO2: 97%   Weight:  53.1 kg  Height:  4\' 11"  (1.499 m)      Constitutional:  Frail-appearing elderly female aler and oriented x 3 .  Moderate respiratory distress HEENT:      Head: Normocephalic and atraumatic.         Eyes: PERLA, EOMI, Conjunctivae are normal. Sclera is non-icteric.       Mouth/Throat: Mucous membranes are moist.       Neck: Supple with no signs of meningismus. Cardiovascular:  Tachycardic. No murmurs, gallops, or rubs. 2+ symmetrical distal pulses are present . No JVD. No LE edema Respiratory: Respiratory effort increased, with mild retractions, abdominal breathing audible wheezing and bibasilar rales Gastrointestinal: Soft, non tender, and non distended with positive bowel sounds.  Genitourinary: No CVA tenderness. Musculoskeletal: Left foot in boot, neurovascularly intact.  Otherwise Nontender with normal range of motion in all extremities. No cyanosis, or erythema of extremities. Neurologic:  Face is symmetric. Moving all extremities. No gross focal neurologic deficits . Skin: Skin is warm, dry.  No rash or ulcers Psychiatric: Mood and affect are normal but appears a bit anxious   Labs on Admission: I have personally reviewed following labs and imaging studies  CBC: Recent Labs  Lab 08/09/2020 0317  WBC 10.9*  NEUTROABS 8.6*  HGB 10.5*  HCT 34.2*  MCV 100.0  PLT 732   Basic Metabolic  Panel: Recent Labs  Lab 08/09/2020 0317  NA 139  K 3.4*  CL 99  CO2 24  GLUCOSE 105*  BUN 26*  CREATININE 1.45*  CALCIUM 8.9   GFR: Estimated Creatinine Clearance: 26.1 mL/min (A) (by C-G formula based on SCr of 1.45 mg/dL (H)). Liver Function Tests: Recent Labs  Lab 07/31/2020 0317  AST 54*  ALT 13  ALKPHOS 60  BILITOT 0.8  PROT 6.2*  ALBUMIN 3.7   No results for input(s): LIPASE, AMYLASE  in the last 168 hours. No results for input(s): AMMONIA in the last 168 hours. Coagulation Profile: No results for input(s): INR, PROTIME in the last 168 hours. Cardiac Enzymes: No results for input(s): CKTOTAL, CKMB, CKMBINDEX, TROPONINI in the last 168 hours. BNP (last 3 results) No results for input(s): PROBNP in the last 8760 hours. HbA1C: No results for input(s): HGBA1C in the last 72 hours. CBG: No results for input(s): GLUCAP in the last 168 hours. Lipid Profile: No results for input(s): CHOL, HDL, LDLCALC, TRIG, CHOLHDL, LDLDIRECT in the last 72 hours. Thyroid Function Tests: No results for input(s): TSH, T4TOTAL, FREET4, T3FREE, THYROIDAB in the last 72 hours. Anemia Panel: No results for input(s): VITAMINB12, FOLATE, FERRITIN, TIBC, IRON, RETICCTPCT in the last 72 hours. Urine analysis:    Component Value Date/Time   COLORURINE YELLOW (A) 01/08/2019 1002   APPEARANCEUR CLEAR (A) 01/08/2019 1002   LABSPEC 1.015 01/08/2019 1002   PHURINE 6.0 01/08/2019 1002   GLUCOSEU NEGATIVE 01/08/2019 1002   HGBUR NEGATIVE 01/08/2019 [place] 01/08/2019 [place] 01/08/2019 1002   PROTEINUR 100 (A) 01/08/2019 1002   NITRITE NEGATIVE 01/08/2019 1002   LEUKOCYTESUR NEGATIVE 01/08/2019 1002    Radiological Exams on Admission: DG Chest 2 View  Result Date: 07/24/2020 CLINICAL DATA:  Shortness of breath EXAM: CHEST - 2 VIEW COMPARISON:  January 08, 2019 FINDINGS: The heart size is stable. The previously demonstrated right-sided Port-A-Cath has been  removed. There are fibrotic changes centrally likely related to post treatment changes. There are new airspace opacities in the right lower lobe with a probable small right-sided pleural effusion. There is fullness of the right hilum. There are Kerley B lines bilaterally. There is no pneumothorax. There is a compression fracture of the T12 vertebral body, chronic appearing. IMPRESSION: 1. New airspace opacities in the right lower lobe concerning for pneumonia. 2. Probable small right-sided pleural effusion. 3. Mild interstitial edema. Electronically Signed   By: Constance Holster M.D.   On: 07/31/2020 03:27     Assessment/Plan 75 year old female with history of SLE, diffuse large B-cell lymphoma followed by Dr. Mike Gip, HTN, CAD s/p CABG, COPD, diastolic heart failure, who is s/p L hallux valgus repair surgery by Dr. Vickki Muff on 07/25/2020, presenting with acute respiratory distress with EMS reporting O2 sats 83% on room air requiring 3 L to get sats to 97% associated with chest tightness and cough.      Acute respiratory failure with hypoxia (HCC) - EMS reporting O2 sat of 83% on room air requiring 2 to 3 L to keep sats in the mid 90s - Etiology likely multifactorial: Differential will include PE with right heart strain given recent surgery,heart failure exacerbation possibly precipitated by NSTEMI,  pneumonia as seen on chest x-ray.  Lower suspicion for sepsis - Rapid COVID antigen negative - Supplemental oxygen to keep sats over 94% - Treat each etiology.  Outlined below    Acute on chronic diastolic CHF (congestive heart failure) ([place]) - BNP 3543 with chest x-ray showing mild interstitial edema - Possible triggers include NSTEMI, fluid administration from recent surgery, possible PE - History of echo back in 2019 with EF 45 to 50% - Reds vest to assess fluid status especially as sepsis may be among the differentials though low suspicion for same - Daily weights with strict intake and output  recording - Echocardiogram - Cardiology consult - Patient received a gentle fluid bolus in the ER for concerns for sepsis - low dose Lasix  for right now, consider NTG of BP will tolerate     NSTEMI (non-ST elevated myocardial infarction) ([place]) CAD with history of CABG - Patient having exertional chest pain following her surgery - Troponin 9838 but no new EKG changes compared to prior from before surgery.  Patient having some chest pain - Continue to trend troponin - Heparin infusion - Patient allergic to aspirin - Statin, metoprolol, nitroglycerin, morphine for breakthrough pain - Cardiology consult - Echocardiogram to evaluate for segmental wall motion abnormality  Possible PE - Stat echocardiogram to evaluate for right heart strain and consider dry CT to evaluate for other potential etiologies to explain patient's symptoms - Creatinine somewhat elevated at 1.45 and patient has history of lupus nephritis - Patient will nonetheless be on heparin infusion to treat NSTEMI - Continue V/Q in the a.m. or CTA chest if renal function has improved    RLL pneumonia, possible sepsis - Chest x-ray showing right lower lobe pneumonia but procalcitonin less than 0.1, COVID antigen test negative - No antibiotics for now - Patient tachycardic and tachypneic with lactic acid of 2 but sepsis not suspected - Continue to monitor closely    AKI (acute kidney injury) (HCC) - Creatinine 1.41 above baseline of 0.98 - Patient received a gentle fluid bolus in the ER - Continue to monitor    History of left hallux surgery 07/25/20 - Consider consulting Dr. Vickki Muff in the a.m. 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  Follows with Dr. Mike Gip in the outpatient      DVT prophylaxis: Full  dose heparin Code Status: full code  Family Communication: Husband at bedside disposition Plan: Back to previous home environment Consults called: Cardiology Status:At the time of admission, it appears that the appropriate admission status for this patient is INPATIENT. This is judged to be reasonable and necessary in order to provide the required intensity of service to ensure the patient's safety given the presenting symptoms, physical exam findings, and initial radiographic and laboratory data in the context of their  Comorbid conditions.   Patient requires inpatient status due to high intensity of service, high risk for further deterioration and high frequency of surveillance required.   I certify that at the point of admission it is my clinical judgment that the patient will require inpatient hospital care spanning beyond [place] MD [facility]     08/13/2020, 5:14 AM

## 2020-07-28 NOTE — Progress Notes (Addendum)
Patient seen and examined personally, I reviewed the chart, history and physical and admission note, done by admitting physician this morning and agree with the same with following addendum.  Please refer to the morning admission note for more detailed plan of care.  Briefly, Pt seen this morning in the ED-She was short of breath was briefly placed on BiPAP and noted to be having heart rate in 30s with complete heart block, atropine was given by ED physician and she also had episode of rapid heart rate possible V. tach underwent cardioversion, reverted to normal sinus rhythm.  Seen by cardiology in ED and taken to Cath Lab urgently.Refer to the Cath Lab for the report, patient was transferred post cath to ICU for medical management- unable to stent, LVEF < 25%. Again saw her in the ICU-she was resting comfortably she has received Lasix following Cath Lab.  She is on 4l [place] saturating well not in acute distress alert awake, no chest pain,has DOE. Resumed home Prednisone, Plaquenil,statin, cont asa. Await echocardiogram result to decide further testing, ??vq/cta based on admission -reassess, but suspect presentation likely from NSTEMI/Acute systolic chf. Cont heparin gtt, asa- repeat BMP, BNP,CXR in the morning.Respiratory status significantly better. Continue further work-up plan per cardiology/admission note- refer to H&P and Consult note for more details. Cath note "Totally occluded left main distally RCA tandem 99% lesions in the mid segment with TIMI I flow probably CTO Grafts SVG to presumed OM was 100% occluded LIMA to mid LAD widely patent and appears to supply the entire heart via collaterals Extensive collaterals to circumflex distal RCA Hand-injection LV gram showed globally preserved ventricular function less than 25% Intervention Attempted intervention of mid RCA Unsuccessful unable to cross with a wire presumed CTO Right groin sealed with minx Patient given Lasix IV Transported to  intensive care for medical "

## 2020-07-28 NOTE — ED Provider Notes (Signed)
[facility] Emergency Department Provider Note   ____________________________________________   Event Date/Time   First MD Initiated Contact with Patient 07/27/2020 0250     (approximate)  I have reviewed the triage vital signs and the nursing notes.   HISTORY  Chief Complaint Respiratory distress   HPI [name] is a 75 y.o. female brought to the ED via EMS from home with a chief complaint of difficulty breathing. Patient had routine left toe surgery on 07/26/2019. Complains of SOB tonight. EMS reports RA saturations 83%; given Albuterol treatment and  oxygen with improvement. Patient has a history of COPD, CHF not on oxygen, hypertension, hyperlipidemia, CAD, Lupus, CKD. SOB associated with chest tightness and cough. Denies fever, chills, abdominal pain, nausea, vomiting and diarrhea.     Past Medical History:  Diagnosis Date  . Anemia    VITAMIN B12 DEFICIENCY  . Arthritis   . Cancer [facility])    Stomach cancer  . Cardiomyopathy ([place])   . CHF (congestive heart failure) ([place])   . Chronic kidney disease    STAGE IV KIDNEY DISEASE  . COPD (chronic obstructive pulmonary disease) ([place])   . DDD (degenerative disc disease), cervical   . GERD (gastroesophageal reflux disease)   . HTN (hypertension)   . Hyperlipidemia   . Lupus ([place])   . 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 Peripheral vascular disease ([place])   . Polyp of ascending colon 05/2020   several small polyps (precancerous) removed.  . Raynaud's disease   . S/P angioplasty with stent   . Wears dentures    full upper and lower    Patient Active Problem List   Diagnosis Date Noted  . Hx of CABG 08/03/2020  . RLL pneumonia 07/27/2020  . Acute respiratory failure with hypoxia ([place]) 07/20/2020  . AKI (acute kidney injury) ([place]) 07/19/2020  . History of colonic polyps   . Polyp of transverse colon   . Polyp of sigmoid colon   . Polyp of ascending colon   . Pain in limb 04/22/2020   . Port-A-Cath in place 02/07/2020  . Cardiomyopathy ([place]) 02/07/2020  . Elevated uric acid in blood 10/07/2019  . Tubular adenoma of colon 10/07/2019  . Decreased cardiac ejection fraction 06/25/2019  . Hypokalemia 06/21/2019  . Hypertensive heart disease with congestive heart failure ([place]) 03/05/2019  . 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 Diastolic heart failure ([place]) 01/18/2019  . Pulmonary emphysema ([place]) 01/18/2019  . Leukopenia 07/12/2018  . Thrombocytopenia ([place]) 05/14/2018  . Encounter for antineoplastic chemotherapy 04/24/2018  . Encounter for antineoplastic immunotherapy 04/24/2018  . Hypercalcemia 04/24/2018  . Goals of care, counseling/discussion 04/15/2018  . 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 CLE (columnar lined esophagus)   . Stomach irritation   . Postpyloric ulcer   . Melena   . Iron deficiency anemia due to chronic blood loss 03/07/2018  . Normocytic anemia 03/07/2018  . B12 deficiency 03/07/2018  . Congenital gastrointestinal vessel anomaly   . Hematochezia   . GIB (gastrointestinal bleeding) 02/04/2018  . GI bleed 12/28/2017  . SOBOE (shortness of breath on exertion) 09/19/2017  . AAA (abdominal aortic aneurysm) ([place]) 08/15/2014  . Hyperlipidemia 01/28/2014  . 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 Hypertension 07/09/2013  . 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 Osteoporosis 04/25/2013  . Peripheral vascular disease ([place]) 04/25/2013  . Allergic rhinitis 03/07/2013  . Chronic kidney disease, stage IV (severe) ([place]) 03/07/2013  . Proteinuria 03/07/2013  .  Renal cyst 03/07/2013  . 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 GERD (gastroesophageal reflux disease) 03/06/2013  . Atherosclerotic heart disease of native coronary artery without angina pectoris 03/06/2013  . Raynaud's phenomenon 03/06/2013    Past Surgical History:  Procedure Laterality Date  . ABDOMINAL HYSTERECTOMY    . AORTO-FEMORAL BYPASS GRAFT  Left 2003  . ARTHRODESIS METATARSALPHALANGEAL JOINT (MTPJ) Left 07/25/2020   Procedure: ARTHRODESIS 1ST METATARSALPHALANGEAL JOINT (MTPJ) LEFT;  Surgeon: Samara Deist, DPM;  Location: [facility] ORS;  Service: Podiatry;  Laterality: Left;  . CARDIAC CATHETERIZATION     stent  . CARDIAC SURGERY    . CATARACT EXTRACTION W/PHACO Right 11/15/2019   Procedure: CATARACT EXTRACTION PHACO AND INTRAOCULAR LENS PLACEMENT (IOC) RIGHT  VISION BLUE 7.48 00:45.8;  Surgeon: Marchia Meiers, MD;  Location: [place];  Service: Ophthalmology;  Laterality: Right;  Port a cath  . CATARACT EXTRACTION W/PHACO Left 12/20/2019   Procedure: CATARACT EXTRACTION PHACO AND INTRAOCULAR LENS PLACEMENT ([place]) LEFT VISION BLUE;  Surgeon: Marchia Meiers, MD;  Location: [place];  Service: Ophthalmology;  Laterality: Left;  6.170:36.1  . CESAREAN SECTION    . COLONOSCOPY N/A 12/30/2017   Procedure: COLONOSCOPY;  Surgeon: Lin Landsman, MD;  Location: [facility] ENDOSCOPY;  Service: Gastroenterology;  Laterality: N/A;  . COLONOSCOPY WITH PROPOFOL N/A 05/23/2020   Procedure: COLONOSCOPY WITH PROPOFOL;  Surgeon: Virgel Manifold, MD;  Location: [facility] ENDOSCOPY;  Service: Endoscopy;  Laterality: N/A;  . CORONARY ARTERY BYPASS GRAFT  2004  . CT SIM FOR RADIATION THERAPY ([place] HX)  2020  . ESOPHAGOGASTRODUODENOSCOPY N/A 12/30/2017   Procedure: ESOPHAGOGASTRODUODENOSCOPY (EGD);  Surgeon: Lin Landsman, MD;  Location: [facility] ENDOSCOPY;  Service: Gastroenterology;  Laterality: N/A;  . ESOPHAGOGASTRODUODENOSCOPY Left 02/05/2018   Procedure: ESOPHAGOGASTRODUODENOSCOPY (EGD);  Surgeon: Virgel Manifold, MD;  Location: [facility] ENDOSCOPY;  Service: Endoscopy;  Laterality: Left;  . ESOPHAGOGASTRODUODENOSCOPY (EGD) WITH PROPOFOL N/A 03/15/2018   Procedure: ESOPHAGOGASTRODUODENOSCOPY (EGD) WITH PROPOFOL;  Surgeon: Virgel Manifold, MD;  Location: [facility] ENDOSCOPY;  Service: Endoscopy;  Laterality: N/A;  . EYE SURGERY    .  FLEXIBLE SIGMOIDOSCOPY N/A 12/31/2017   Procedure: FLEXIBLE SIGMOIDOSCOPY;  Surgeon: Jonathon Bellows, MD;  Location: [facility] ENDOSCOPY;  Service: Gastroenterology;  Laterality: N/A;  . PORTA CATH INSERTION N/A 04/19/2018   Procedure: PORTA CATH INSERTION;  Surgeon: Algernon Huxley, MD;  Location: [place] CV LAB;  Service: Cardiovascular;  Laterality: N/A;  . PORTA CATH REMOVAL N/A 02/28/2020   Procedure: PORTA CATH REMOVAL;  Surgeon: Algernon Huxley, MD;  Location: [place] CV LAB;  Service: Cardiovascular;  Laterality: N/A;    Prior to Admission medications   Medication Sig Start Date End Date Taking? Authorizing Provider  acetaminophen (TYLENOL) 500 MG tablet Take 1,000 mg by mouth 2 (two) times daily as needed for moderate pain or headache.    [provider]  alendronate (FOSAMAX) 70 MG tablet Take 70 mg by mouth every Saturday.     [provider]  amLODipine (NORVASC) 10 MG tablet Take 10 mg by mouth daily. 08/15/18   [provider]  fluticasone (FLONASE) 50 MCG/ACT nasal spray Place 1 spray into both nostrils daily as needed for allergies or rhinitis.    [provider]  HYDROcodone-acetaminophen (NORCO/VICODIN) 5-325 MG tablet Take 1 tablet by mouth 2 (two) times daily as needed (pain.). 06/04/20   [provider]  hydroxychloroquine (PLAQUENIL) 200 MG tablet Take 200 mg by mouth 2 (two) times daily.  01/22/14   [provider]  oxyCODONE-acetaminophen (PERCOCET) 5-325 MG tablet Take 1-2 tablets by mouth every 6 (six) hours as needed for severe pain. Max 6 tabs per day 07/25/20   Samara Deist, DPM  polyethylene glycol [facility] / GLYCOLAX) packet Take 17 g by mouth daily.    [provider]  predniSONE (DELTASONE) 5 MG tablet Take 5 mg by mouth daily with breakfast.  12/12/13   [provider]  Pseudoephedrine-APAP 30-325 MG TABS Take 1 tablet by mouth daily as needed (sinuses).     [provider]  simvastatin  (ZOCOR) 40 MG tablet Take 40 mg by mouth every evening. 08/15/14   [provider]    Allergies Penicillins, Ace inhibitors, Losartan, Spironolactone, Sulfa antibiotics, and Sulfasalazine  Family History  Problem Relation Age of Onset  . Hypertension Mother   . Stroke Mother   . Vascular Disease Mother   . Hypertension Sister     Social History Social History   Tobacco Use  . Smoking status: Former Smoker    Packs/day: 0.50    Years: 20.00    Pack years: 10.00    Types: Cigarettes    Quit date: 03/07/2002    Years since quitting: 18.4  . Smokeless tobacco: Never Used  Vaping Use  . Vaping Use: Never used  Substance Use Topics  . Alcohol use: Not Currently  . Drug use: Never    Review of Systems  Constitutional: No fever/chills Eyes: No visual changes. ENT: No sore throat. Cardiovascular: Positive for chest pain. Respiratory: Positive for cough and shortness of breath. Gastrointestinal: No abdominal pain.  No nausea, no vomiting.  No diarrhea.  No constipation. Genitourinary: Negative for dysuria. Musculoskeletal: Negative for back pain. Skin: Negative for rash. Neurological: Negative for headaches, focal weakness or numbness.   ____________________________________________   PHYSICAL EXAM:  VITAL SIGNS: ED Triage Vitals  Enc Vitals Group     BP      Pulse      Resp      Temp      Temp src      SpO2      Weight      Height      Head Circumference      Peak Flow      Pain Score      Pain Loc      Pain Edu?      Excl. in [place]?     Constitutional: Alert and oriented.  Elderly appearing and in mild acute distress. Eyes: Conjunctivae are normal. PERRL. EOMI. Head: Atraumatic. Nose: No congestion/rhinnorhea. Mouth/Throat: Mucous membranes are moist.  Oropharynx non-erythematous. Neck: No stridor.   Cardiovascular: Normal rate, regular rhythm. Grossly normal heart sounds.  Good peripheral circulation. Respiratory: Increased respiratory effort.   Mild retractions. Lungs with audible wheezing and rales. Gastrointestinal: Soft and nontender to light or deep palpation. No distention. No abdominal bruits. No CVA tenderness. Musculoskeletal: Left foot in boot.  Able to wiggle toes which are pink and warm.  No lower extremity tenderness nor edema.  No calf tenderness.  No joint effusions. Neurologic:  Normal speech and language. No gross focal neurologic deficits are appreciated.  Skin:  Skin is warm, dry and intact. No rash noted. Psychiatric: Mood and affect are normal. Speech and behavior are normal.  ____________________________________________   LABS (all labs ordered are listed, but only abnormal results are displayed)  Labs Reviewed  LACTIC ACID, PLASMA - Abnormal; Notable for the following components:      Result Value  Lactic Acid, Venous 2.0 (*)    All other components within normal limits  CBC WITH DIFFERENTIAL/PLATELET - Abnormal; Notable for the following components:   WBC 10.9 (*)    RBC 3.42 (*)    Hemoglobin 10.5 (*)    HCT 34.2 (*)    Neutro Abs 8.6 (*)    All other components within normal limits  COMPREHENSIVE METABOLIC PANEL - Abnormal; Notable for the following components:   Potassium 3.4 (*)    Glucose, Bld 105 (*)    BUN 26 (*)    Creatinine, Ser 1.45 (*)    Total Protein 6.2 (*)    AST 54 (*)    GFR, Estimated 38 (*)    Anion gap 16 (*)    All other components within normal limits  BRAIN NATRIURETIC PEPTIDE - Abnormal; Notable for the following components:   B Natriuretic Peptide 3,543.3 (*)    All other components within normal limits  TROPONIN I (HIGH SENSITIVITY) - Abnormal; Notable for the following components:   Troponin I (High Sensitivity) [id number] (*)    All other components within normal limits  CULTURE, BLOOD (ROUTINE X 2)  CULTURE, BLOOD (ROUTINE X 2)  RESP PANEL BY RT-PCR (FLU A&B, COVID) ARPGX2  PROCALCITONIN  LACTIC ACID, PLASMA  PROTIME-INR  APTT  POC SARS CORONAVIRUS 2 AG -  ED   TROPONIN I (HIGH SENSITIVITY)   ____________________________________________  EKG  ED ECG REPORT I, [place] Osten J, the attending physician, personally viewed and interpreted this ECG.   Date: 07/26/2020  EKG Time: 0425  Rate: 91  Rhythm: normal EKG, normal sinus rhythm  Axis: LAD  Intervals:right bundle branch block  ST&T Change: Nonspecific No significant change from 07/16/2020  ____________________________________________  RADIOLOGY I, Lurline Hare J, personally viewed and evaluated these images (plain radiographs) as part of my medical decision making, as well as reviewing the written report by the radiologist.  ED MD interpretation: Right lower lobe pneumonia, mild interstitial edema  Official radiology report(s): DG Chest 2 View  Result Date: 08/01/2020 CLINICAL DATA:  Shortness of breath EXAM: CHEST - 2 VIEW COMPARISON:  January 08, 2019 FINDINGS: The heart size is stable. The previously demonstrated right-sided Port-A-Cath has been removed. There are fibrotic changes centrally likely related to post treatment changes. There are new airspace opacities in the right lower lobe with a probable small right-sided pleural effusion. There is fullness of the right hilum. There are Kerley B lines bilaterally. There is no pneumothorax. There is a compression fracture of the T12 vertebral body, chronic appearing. IMPRESSION: 1. New airspace opacities in the right lower lobe concerning for pneumonia. 2. Probable small right-sided pleural effusion. 3. Mild interstitial edema. Electronically Signed   By: Constance Holster M.D.   On: 08/14/2020 03:27    ____________________________________________   PROCEDURES  Procedure(s) performed (including Critical Care):  .1-3 Lead EKG Interpretation Performed by: Paulette Blanch, MD Authorized by: Paulette Blanch, MD     Interpretation: normal     ECG rate:  90   ECG rate assessment: normal     Rhythm: sinus rhythm     Ectopy: none     Conduction:  normal   Comments:     Patient placed on cardiac monitor to evaluate for arrhythmias   CRITICAL CARE Performed by: Paulette Blanch   Total critical care time: 45 minutes  Critical care time was exclusive of separately billable procedures and treating other patients.  Critical care was necessary to treat or prevent imminent or  life-threatening deterioration.  Critical care was time spent personally by me on the following activities: development of treatment plan with patient and/or surrogate as well as nursing, discussions with consultants, evaluation of patient's response to treatment, examination of patient, obtaining history from patient or surrogate, ordering and performing treatments and interventions, ordering and review of laboratory studies, ordering and review of radiographic studies, pulse oximetry and re-evaluation of patient's condition.  ____________________________________________   INITIAL IMPRESSION / ASSESSMENT AND PLAN / ED COURSE  As part of my medical decision making, I reviewed the following data within the [facility] notes reviewed and incorporated, Labs reviewed, EKG interpreted, Old chart reviewed, Radiograph reviewed, Discussed with admitting physician and Notes from prior ED visits     75 year old female with COPD and CHF presenting in respiratory distress. Differential includes, but is not limited to, viral syndrome, bronchitis including COPD exacerbation, pneumonia, reactive airway disease including asthma, CHF including exacerbation with or without pulmonary/interstitial edema, pneumothorax, ACS, thoracic trauma, and pulmonary embolism.  Will obtain septic work-up, chest x-ray.  Administer IV Solu-Medrol, DuoNeb.  Anticipate hospitalization.  Clinical Course as of 08/01/2020 0505  Mon Jul 28, 2020  2831 Meets sepsis criteria by heart rate, respiratory rate.  IV antibiotics for community-acquired pneumonia have been ordered.  Elevated  troponin noted.  Will initiate heparin bolus with drip.  Will discuss with hospitalist services for admission. [JS]  5176 Holding aspirin as patient is allergic to sulfasalazine.  Will give low-dose morphine for chest tightness. [JS]  1607 Discussed with Dr. Damita Dunnings from hospitalist services.  Given that patient is not in septic shock with signs of CHF exacerbation, will hold total IV fluids at 500 mL.  Did consider PE in the differential but after discussion we will hold CTA for now given patient's low GFR plus the fact that she will be on heparin drip. [JS]    Clinical Course User Index [JS] Paulette Blanch, MD     ____________________________________________   FINAL CLINICAL IMPRESSION(S) / ED DIAGNOSES  Final diagnoses:  Respiratory distress  Hypoxia  Community acquired pneumonia of right lower lobe of lung  NSTEMI (non-ST elevated myocardial infarction) ([place])  Acute on chronic congestive heart failure, unspecified heart failure type [facility])     ED Discharge Orders    None      *Please note:  In Chidera Dearcos was evaluated in Emergency Department on 07/25/2020 for the symptoms described in the history of present illness. She was evaluated in the context of the global COVID-19 pandemic, which necessitated consideration that the patient might be at risk for infection with the SARS-CoV-2 virus that causes COVID-19. Institutional protocols and algorithms that pertain to the evaluation of patients at risk for COVID-19 are in a state of rapid change based on information released by regulatory bodies including the CDC and federal and state organizations. These policies and algorithms were followed during the patient's care in the ED.  Some ED evaluations and interventions may be delayed as a result of limited staffing during and the pandemic.*   Note:  This document was prepared using Dragon voice recognition software and may include unintentional dictation errors.   Paulette Blanch, MD 08/09/2020  [phone]

## 2020-07-28 NOTE — Progress Notes (Signed)
PHARMACY -  BRIEF ANTIBIOTIC NOTE   Pharmacy has received consult(s) for Levaquin due to hx of PCN allergy from an ED provider.  The patient's profile has been reviewed for ht/wt/allergies/indication/available labs.    Did not find any hx of use of any cephalosporin in search of Care Everywhere.   One time order(s) placed for Levaquin.  Further antibiotics/pharmacy consults should be ordered by admitting physician if indicated.                       Thank you,  Renda Rolls, PharmD, [facility] 08/08/2020 4:23 AM

## 2020-07-28 NOTE — ED Notes (Signed)
After atropine pts HR did not improve. Pads placed on pt and defibrillator in room.

## 2020-07-28 NOTE — Progress Notes (Signed)
Notified provider & bedside RN  of need to order repeat lactic acid d/t 2nd LA increasing from 1st.

## 2020-07-28 NOTE — ED Notes (Signed)
All clothes removed, top and bottom dentures placed in cup, wedding ring given to husband. Leaving floor now, heading to cath lab

## 2020-07-28 NOTE — ED Provider Notes (Addendum)
-----------------------------------------   7:44 AM on 08/18/2020 -----------------------------------------  I was called into the room on this admitted patient as she had developed increased respiratory distress after using the toilet.  She was placed on a nonrebreather, with O2 saturation 100% but increased work of breathing.  I initially recommended that we start BiPAP although respiratory at that time was in with another patient.  A few minutes later I was called back into the room as the patient was bradycardic to the 30s.  Monitor and EKG showed complete heart block.  Atropine was given x2 with temporary improvement in the rate, and I have ordered a 500 mL fluid bolus.  The patient may need pacing.  I consulted Dr. Rockey Situ from cardiology.  He advised that an NSTEMI now developing heart block may require cardiac catheterization, so he recommended contacting the on-call STEMI provider and he would follow.   ED ECG REPORT I, Arta Silence, the attending physician, personally viewed and interpreted this ECG.  Date: 08/18/2020 EKG Time: 0726 Rate: 40 Rhythm: Complete heart block QRS Axis: normal Intervals: RBBB ST/T Wave abnormalities: Nonspecific abnormalities Narrative Interpretation: Complete heart block, bradycardic   ----------------------------------------- 8:04 AM on 08/15/2020 -----------------------------------------  I consulted Dr. Clayborn Bigness from cardiology who is the current STEMI provider.  While on the phone with him, the patient went into V. tach in the 130s.  Blood pressure had improved but the patient continued to appear weak and short of breath.  I gave the patient fentanyl and Versed for comfort and elected to proceed with synchronized cardioversion at 120 J.  The patient returned to sinus tachycardia and blood pressure remained stable.  ED ECG REPORT I, Arta Silence, the attending physician, personally viewed and interpreted this ECG.  Date:  07/24/2020 EKG Time: 0759 (post cardioversion) Rate: 119 Rhythm: Sinus tachycardia QRS Axis: normal Intervals: RBBB, LAFB ST/T Wave abnormalities: Nonspecific abnormalities Narrative Interpretation: Sinus tachycardia  ----------------------------------------- 8:50 AM on 07/31/2020 -----------------------------------------  Dr. Clayborn Bigness evaluated the patient and recommended taking her to the Cath Lab.  She remained in sinus rhythm.  She is now off the ED floor.  I updated the hospitalist Ramesh on the patient's status.  ___________________________  CRITICAL CARE Performed by: Arta Silence   Total critical care time: 45 minutes  Critical care time was exclusive of separately billable procedures and treating other patients.  Critical care was necessary to treat or prevent imminent or life-threatening deterioration.  Critical care was time spent personally by me on the following activities: development of treatment plan with patient and/or surrogate as well as nursing, discussions with consultants, evaluation of patient's response to treatment, examination of patient, obtaining history from patient or surrogate, ordering and performing treatments and interventions, ordering and review of laboratory studies, ordering and review of radiographic studies, pulse oximetry and re-evaluation of patient's condition.    Arta Silence, MD 07/31/2020 8119    Arta Silence, MD 07/19/2020 [phone]

## 2020-07-28 NOTE — Progress Notes (Signed)
*  PRELIMINARY RESULTS* Echocardiogram 2D Echocardiogram has been performed.  Judy Torres 08/01/2020, 12:05 PM

## 2020-07-29 ENCOUNTER — Inpatient Hospital Stay: Payer: Medicare HMO

## 2020-07-29 ENCOUNTER — Encounter: Payer: Self-pay | Admitting: Internal Medicine

## 2020-07-29 DIAGNOSIS — J449 Chronic obstructive pulmonary disease, unspecified: Secondary | ICD-10-CM

## 2020-07-29 DIAGNOSIS — J9601 Acute respiratory failure with hypoxia: Secondary | ICD-10-CM | POA: Diagnosis not present

## 2020-07-29 DIAGNOSIS — I5031 Acute diastolic (congestive) heart failure: Secondary | ICD-10-CM | POA: Diagnosis not present

## 2020-07-29 DIAGNOSIS — N179 Acute kidney failure, unspecified: Secondary | ICD-10-CM

## 2020-07-29 DIAGNOSIS — I5021 Acute systolic (congestive) heart failure: Secondary | ICD-10-CM

## 2020-07-29 DIAGNOSIS — I214 Non-ST elevation (NSTEMI) myocardial infarction: Principal | ICD-10-CM

## 2020-07-29 DIAGNOSIS — C8330 Diffuse large B-cell lymphoma, unspecified site: Secondary | ICD-10-CM | POA: Diagnosis not present

## 2020-07-29 DIAGNOSIS — I5033 Acute on chronic diastolic (congestive) heart failure: Secondary | ICD-10-CM | POA: Diagnosis not present

## 2020-07-29 DIAGNOSIS — D649 Anemia, unspecified: Secondary | ICD-10-CM

## 2020-07-29 DIAGNOSIS — E785 Hyperlipidemia, unspecified: Secondary | ICD-10-CM

## 2020-07-29 LAB — CBC
HCT: 30.8 % — ABNORMAL LOW (ref 36.0–46.0)
Hemoglobin: 9.8 g/dL — ABNORMAL LOW (ref 12.0–15.0)
MCH: 31.2 pg (ref 26.0–34.0)
MCHC: 31.8 g/dL (ref 30.0–36.0)
MCV: 98.1 fL (ref 80.0–100.0)
Platelets: 174 10*3/uL (ref 150–400)
RBC: 3.14 MIL/uL — ABNORMAL LOW (ref 3.87–5.11)
RDW: 14 % (ref 11.5–15.5)
WBC: 11.2 10*3/uL — ABNORMAL HIGH (ref 4.0–10.5)
nRBC: 0 % (ref 0.0–0.2)

## 2020-07-29 LAB — ECHOCARDIOGRAM COMPLETE
AR max vel: 1.65 cm2
AV Area VTI: 2.39 cm2
AV Area mean vel: 1.46 cm2
AV Mean grad: 3.5 mmHg
AV Peak grad: 5.2 mmHg
Ao pk vel: 1.15 m/s
Area-P 1/2: 7.29 cm2
Calc EF: 34.8 %
Height: 59 in
S' Lateral: 4.33 cm
Single Plane A2C EF: 23.7 %
Single Plane A4C EF: 43.1 %
Weight: 1872 oz

## 2020-07-29 LAB — HEPARIN LEVEL (UNFRACTIONATED)
Heparin Unfractionated: 0.36 IU/mL (ref 0.30–0.70)
Heparin Unfractionated: 0.48 IU/mL (ref 0.30–0.70)

## 2020-07-29 LAB — BASIC METABOLIC PANEL
Anion gap: 18 — ABNORMAL HIGH (ref 5–15)
BUN: 40 mg/dL — ABNORMAL HIGH (ref 8–23)
CO2: 19 mmol/L — ABNORMAL LOW (ref 22–32)
Calcium: 8.6 mg/dL — ABNORMAL LOW (ref 8.9–10.3)
Chloride: 98 mmol/L (ref 98–111)
Creatinine, Ser: 1.66 mg/dL — ABNORMAL HIGH (ref 0.44–1.00)
GFR, Estimated: 33 mL/min — ABNORMAL LOW (ref >60)
Glucose, Bld: 152 mg/dL — ABNORMAL HIGH (ref 70–99)
Potassium: 4 mmol/L (ref 3.5–5.1)
Sodium: 135 mmol/L (ref 135–145)

## 2020-07-29 LAB — BRAIN NATRIURETIC PEPTIDE: B Natriuretic Peptide: >4500 pg/mL — ABNORMAL HIGH (ref 0.0–100.0)

## 2020-07-29 LAB — LIPID PANEL
Cholesterol: 172 mg/dL (ref 0–200)
HDL: 86 mg/dL (ref >40)
LDL Cholesterol: 72 mg/dL (ref 0–99)
Total CHOL/HDL Ratio: 2 RATIO
Triglycerides: 71 mg/dL (ref <150)
VLDL: 14 mg/dL (ref 0–40)

## 2020-07-29 LAB — GLUCOSE, CAPILLARY: Glucose-Capillary: 107 mg/dL — ABNORMAL HIGH (ref 70–99)

## 2020-07-29 LAB — LACTIC ACID, PLASMA: Lactic Acid, Venous: 1.8 mmol/L (ref 0.5–1.9)

## 2020-07-29 MED ORDER — FUROSEMIDE 10 MG/ML IJ SOLN
40.0000 mg | Freq: Two times a day (BID) | INTRAMUSCULAR | Status: DC
  Administered 2020-07-29 – 2020-07-30 (×3): 40 mg via INTRAVENOUS
  Filled 2020-07-29 (×3): qty 4

## 2020-07-29 MED ORDER — NITROGLYCERIN IN D5W 200-5 MCG/ML-% IV SOLN
5.0000 ug/min | INTRAVENOUS | Status: DC
  Administered 2020-07-29: 5 ug/min via INTRAVENOUS
  Filled 2020-07-29: qty 250

## 2020-07-29 NOTE — Consult Note (Signed)
NAME:  Judy Torres, MRN:  [id number], DOB:  07-26-1945, LOS: 1 ADMISSION DATE:  08/02/2020, CONSULTATION DATE: 07/29/2020 REFERRING MD: Dr. Saunders Revel, CHIEF COMPLAINT: Shortness of Breath  Brief History:  75 yo female admitted with acute hypoxic respiratory failure secondary to acute on chronic HFrEF and NSTEMI s/p cardiac cath which revealed severe multivessel CAD with unsuccessful intervention attempt of the native mid RCA lesion requiring continuous Bipap, nitroglycerin gtt, and aggressive diuresis   History of Present Illness:  This is a 75 yo female who presented to [facility] ER via EMS on 01/10 with acute onset of shortness of breath.  EMS reported pt hypoxic upon their arrival O2 sats 83% on RA.  She was placed on O2 @3L , and EMS administered albuterol treatment with improvement of oxygenation.  Upon arrival to the ER pt afebrile, tachycardic hr 102, mildly tachypneic, and CXR concerning for pneumonia along with interstitial edema pt received levaquin x1 dose.  Lab results significant for troponin 9,838 and BNP 3,543.3, therefore heparin gtt initiated and Cardiology consulted.  Pt subsequently admitted to the progressive care unit per hospitalist team for additional workup and treatment but remained Judy the ER pending bed availability.  On the morning of 01/10 while Judy the ER pt developed worsening acute hypoxic respiratory with tachypnea; was found slumped down Judy the bed; tachycardic; and diaphoretic.  ER physician arrived at bedside recommended placing pt on continuous Bipap. However, pt bradycardic EKG revealed complete heart block, hr 40.  Pt received atropine x2 along with 500 ml fluid bolus with temporary improvement Judy heart rate.  Cardiologist Dr. Rockey Situ consulted and concerned for NSTEMI. Dr. Rockey Situ recommended contacting on-call STEMI provider Dr. Clayborn Bigness for possible need for cardiac cath.  Pt later developed ventricular tachycardia requiring defibrillation x1, post cardiac rhythm sinus atrial  tachycardia hr 119 with right bundle branch block.  Pt transported emergently to cardiac cath lab 01/10.  Cardiac cath revealed severe multivessel CAD (see detailed report below). Intervention was attempted on the native mid RCA lesion but was unsuccessful due to inability to cross the lesion with wire.  LV gram with an EF of less than 25%.  She was subsequently transferred to ICU post procedure on NRB.  On 01/11 pt c/o worsening hypoxia requiring continuous Bipap.  [facility] Cardiology consulted recommended lasix and nitroglycerin gtts. Per cardiology if pts respiratory status did not improve pt would require mechanical intubation and transfer to [facility] for heart failure management.    Past Medical History:  Raynaud's Disease Polyp of Ascending Colon Lupus PVD Lupus Nephritis, ISN/RPS class IV HLD HTN GERD DDD Stage IV CKD Arthritis Stomach Cancer  Anemia secondary to vitamin B12 deficiency  Significant Hospital Events:  01/10: Pt presented to [facility] ER with acute hypoxic respiratory failure requiring admission to the PCU.  However, remained Judy the ER pending bed availability 01/10: Code STEMI initiated, pt underwent emergent cardiac cath and required transfer to ICU post procedure 01/11: Pt developed worsening acute hypoxic respiratory failure requiring Bipap.  [facility] and PCCM team consulted to assist with management.  Per cardiology pt placed on nitroglycerin gtt and 40 mg iv lasix bid  Consults:  Intensivist  Cardiology   Procedures:  Cardiac Catheterization 01/10  Significant Diagnostic Tests:  Cardiac catheterization 01/10>>Mid LM to Dist LM lesion is 100% stenosed. Mid RCA lesion is 100% stenosed. Post intervention, there is a 100% residual stenosis. Ost LAD lesion is 100% stenosed. Mid LAD lesion is 100% stenosed. Origin lesion is 100% stenosed.  100% occluded circumflex at the ostium 100% occluded vein graft to circumflex system probable IRA Widely patent LIMA to mid  LA Extensive collaterals from LAD system to circumflex as well as distal RCA Severely depressed left ventricular function globally left ventricular enlargement ejection fraction less than 25% Unsuccessful attempt to intervene on the mid right coronary artery because of the CTO Myn[employer] was deployed Clinical support was deferred since the patient did not appear to be Judy cardiogenic shock at the time    Echo 01/10>>EF 30 to 35%   Micro Data:  Blood x2 01/10>>negative  MRSA PCR 01/10>>negative  COVID-19/Influenza PCR 01/10>>negative   Antimicrobials:  Levaquin 01/10 x1 dose   Interim History / Subjective:  Pt remains on Bipap and slightly tachypneic states shortness of breath has improved since placed on Bipap.   Objective   Blood pressure 123/84, pulse 97, temperature (!) 97.4 F (36.3 C), temperature source Oral, resp. rate (!) 23, height 4\' 11"  (1.499 m), weight 54.2 kg, SpO2 93 %.        Intake/Output Summary (Last 24 hours) at 07/29/2020 1006 Last data filed at 07/29/2020 0300 Gross per 24 hour  Intake 20.4 ml  Output 450 ml  Net -429.6 ml   Filed Weights   08/04/2020 0300 07/29/20 0500  Weight: 53.1 kg 54.2 kg   Examination: General: acutely ill appearing female, mild respiratory distress on Bipap  HENT: supple, JVD present  Lungs: faint crackles throughout, slightly tachypneic with accessory muscle use Cardiovascular: sinus rhythm, S1 S2, no R/G, 2+ radial and 1+ distal pulses  Abdomen: +BS x4, obese, soft, non tender, non distended  Extremities: LLE bandage Judy place recent arthrodesis left first MTPJ (01/7) Neuro: alert, follows commands, PERRLA  GU: foley catheter making urine   Assessment & Plan:   Acute on chronic hypoxic respiratory failure secondary to acute on chronic HFrEF CAD with NSTEMI   HLD  Hx: CABG  Emergent LHC 01/10 with CAD not amendable to percutaneous intervention  Continuous telemetry monitoring  Continue heparin gtt-dosing per pharmacy  Cardiology  consulted appreciate input-nitroglycerin gtt and aggressive diuresis if pts respiratory status does not improve or worsens will need mechanical intubation and transfer to [facility] for heart failure management  Prn Bipap or supplemental O2 for dyspnea and/or hypoxia  Continue aspirin and simvastatin   Acute on chronic renal failure  Trend BMP  Replace electrolytes as indicated  Monitor UOP Avoid nephrotoxic medications when able   Anemia without obvious acute blood loss  Trend CBC  Monitor for s/sx of bleeding and transfuse for hgb <7  Lupus  Continue outpatient hydroxychloroquine   Best practice (evaluated daily)  Diet: Heart Healthy when able to wean off Bipap  Pain/Anxiety/Delirium protocol (if indicated): Not indicated  VAP protocol (if indicated): Not indicated  DVT prophylaxis: Heparin gtt  GI prophylaxis: Not indicated  Glucose control: Not indicated  Mobility: Bedrest  Disposition: ICU   Goals of Care:  Last date of multidisciplinary goals of care discussion: 07/29/2020 Family and staff present: Discussed plan of care with pt and pts husband who is at bedside  Summary of discussion: Discussed plan of care, possible need of mechanical intubation, and transfer to [facility] if respiratory status does not improve or declines despite nitroglycerin gtt and aggressive diuresis.  If neccessary pt and pts husband agreed with this plan. Follow up goals of care discussion due: 08-04-20 Code Status: Full code   Labs   CBC: Recent Labs  Lab 08/04/2020 0317 07/29/20 0735  WBC 10.9* 11.2*  NEUTROABS 8.6*  --   HGB 10.5* 9.8*  HCT 34.2* 30.8*  MCV 100.0 98.1  PLT 181 329    Basic Metabolic Panel: Recent Labs  Lab 08/05/2020 0317 07/29/20 0735  NA 139 135  K 3.4* 4.0  CL 99 98  CO2 24 19*  GLUCOSE 105* 152*  BUN 26* 40*  CREATININE 1.45* 1.66*  CALCIUM 8.9 8.6*   GFR: Estimated Creatinine Clearance: 23 mL/min (A) (by C-G formula based on SCr of 1.66 mg/dL  (H)). Recent Labs  Lab 08/01/2020 0317 07/22/2020 0500 07/29/20 0735 07/29/20 0856  PROCALCITON <0.10  --   --   --   WBC 10.9*  --  11.2*  --   LATICACIDVEN 2.0* 2.5*  --  1.8    Liver Function Tests: Recent Labs  Lab 08/08/2020 0317  AST 54*  ALT 13  ALKPHOS 60  BILITOT 0.8  PROT 6.2*  ALBUMIN 3.7   No results for input(s): LIPASE, AMYLASE Judy the last 168 hours. No results for input(s): AMMONIA Judy the last 168 hours.  ABG No results found for: PHART, PCO2ART, PO2ART, HCO3, TCO2, ACIDBASEDEF, O2SAT   Coagulation Profile: Recent Labs  Lab 08/18/2020 0500  INR 1.0    Cardiac Enzymes: No results for input(s): CKTOTAL, CKMB, CKMBINDEX, TROPONINI Judy the last 168 hours.  HbA1C: No results found for: HGBA1C  CBG: Recent Labs  Lab 07/19/2020 1104  GLUCAP 134*    Review of Systems: Positives Judy BOLD   Gen: Denies fever, chills, weight change, fatigue, night sweats HEENT: Denies blurred vision, double vision, hearing loss, tinnitus, sinus congestion, rhinorrhea, sore throat, neck stiffness, dysphagia PULM: shortness of breath, cough, sputum production, hemoptysis, wheezing CV: chest tightness, edema, orthopnea, paroxysmal nocturnal dyspnea, palpitations GI: Denies abdominal pain, nausea, vomiting, diarrhea, hematochezia, melena, constipation, change Judy bowel habits GU: Denies dysuria, hematuria, polyuria, oliguria, urethral discharge Endocrine: Denies hot or cold intolerance, polyuria, polyphagia or appetite change Derm: Denies rash, dry skin, scaling or peeling skin change Heme: Denies easy bruising, bleeding, bleeding gums Neuro: Denies headache, numbness, weakness, slurred speech, loss of memory or consciousness  Past Medical History:  She,  has a past medical history of Anemia, Arthritis, Cancer ([place]), Cardiomyopathy ([place]), CHF (congestive heart failure) ([place]), Chronic kidney disease, COPD (chronic obstructive pulmonary disease) ([place]), DDD (degenerative disc disease),  cervical, GERD (gastroesophageal reflux disease), HTN (hypertension), Hyperlipidemia, Lupus ([place]), Lupus nephritis, ISN/RPS class IV ([place]), Peripheral vascular disease ([place]), Polyp of ascending colon (05/2020), Raynaud's disease, S/P angioplasty with stent, and Wears dentures.   Surgical History:   Past Surgical History:  Procedure Laterality Date  . ABDOMINAL HYSTERECTOMY    . AORTO-FEMORAL BYPASS GRAFT Left 2003  . ARTHRODESIS METATARSALPHALANGEAL JOINT (MTPJ) Left 07/25/2020   Procedure: ARTHRODESIS 1ST METATARSALPHALANGEAL JOINT (MTPJ) LEFT;  Surgeon: Samara Deist, DPM;  Location: [facility] ORS;  Service: Podiatry;  Laterality: Left;  . CARDIAC CATHETERIZATION     stent  . CARDIAC SURGERY    . CATARACT EXTRACTION W/PHACO Right 11/15/2019   Procedure: CATARACT EXTRACTION PHACO AND INTRAOCULAR LENS PLACEMENT (IOC) RIGHT  VISION BLUE 7.48 00:45.8;  Surgeon: Marchia Meiers, MD;  Location: [place];  Service: Ophthalmology;  Laterality: Right;  Port a cath  . CATARACT EXTRACTION W/PHACO Left 12/20/2019   Procedure: CATARACT EXTRACTION PHACO AND INTRAOCULAR LENS PLACEMENT ([place]) LEFT VISION BLUE;  Surgeon: Marchia Meiers, MD;  Location: [place];  Service: Ophthalmology;  Laterality: Left;  6.170:36.1  . CESAREAN SECTION    .  COLONOSCOPY N/A 12/30/2017   Procedure: COLONOSCOPY;  Surgeon: Lin Landsman, MD;  Location: [facility] ENDOSCOPY;  Service: Gastroenterology;  Laterality: N/A;  . COLONOSCOPY WITH PROPOFOL N/A 05/23/2020   Procedure: COLONOSCOPY WITH PROPOFOL;  Surgeon: Virgel Manifold, MD;  Location: [facility] ENDOSCOPY;  Service: Endoscopy;  Laterality: N/A;  . CORONARY ARTERY BYPASS GRAFT  2004  . CORONARY/GRAFT ACUTE MI REVASCULARIZATION N/A 07/22/2020   Procedure: Coronary/Graft Acute MI Revascularization;  Surgeon: Yolonda Kida, MD;  Location: [place] CV LAB;  Service: Cardiovascular;  Laterality: N/A;  . CT SIM FOR RADIATION THERAPY ([facility])  2020  .  ESOPHAGOGASTRODUODENOSCOPY N/A 12/30/2017   Procedure: ESOPHAGOGASTRODUODENOSCOPY (EGD);  Surgeon: Lin Landsman, MD;  Location: [facility] ENDOSCOPY;  Service: Gastroenterology;  Laterality: N/A;  . ESOPHAGOGASTRODUODENOSCOPY Left 02/05/2018   Procedure: ESOPHAGOGASTRODUODENOSCOPY (EGD);  Surgeon: Virgel Manifold, MD;  Location: [facility] ENDOSCOPY;  Service: Endoscopy;  Laterality: Left;  . ESOPHAGOGASTRODUODENOSCOPY (EGD) WITH PROPOFOL N/A 03/15/2018   Procedure: ESOPHAGOGASTRODUODENOSCOPY (EGD) WITH PROPOFOL;  Surgeon: Virgel Manifold, MD;  Location: [facility] ENDOSCOPY;  Service: Endoscopy;  Laterality: N/A;  . EYE SURGERY    . FLEXIBLE SIGMOIDOSCOPY N/A 12/31/2017   Procedure: FLEXIBLE SIGMOIDOSCOPY;  Surgeon: Jonathon Bellows, MD;  Location: [facility] ENDOSCOPY;  Service: Gastroenterology;  Laterality: N/A;  . LEFT HEART CATH AND CORONARY ANGIOGRAPHY N/A 08/06/2020   Procedure: LEFT HEART CATH AND CORONARY ANGIOGRAPHY;  Surgeon: Yolonda Kida, MD;  Location: [place] CV LAB;  Service: Cardiovascular;  Laterality: N/A;  . PORTA CATH INSERTION N/A 04/19/2018   Procedure: PORTA CATH INSERTION;  Surgeon: Algernon Huxley, MD;  Location: [place] CV LAB;  Service: Cardiovascular;  Laterality: N/A;  . PORTA CATH REMOVAL N/A 02/28/2020   Procedure: PORTA CATH REMOVAL;  Surgeon: Algernon Huxley, MD;  Location: [place] CV LAB;  Service: Cardiovascular;  Laterality: N/A;     Social History:   reports that she quit smoking about 18 years ago. Her smoking use included cigarettes. She has a 10.00 pack-year smoking history. She has never used smokeless tobacco. She reports previous alcohol use. She reports that she does not use drugs.   Family History:  Her family history includes Hypertension Judy her mother and sister; Stroke Judy her mother; Vascular Disease Judy her mother.   Allergies Allergies  Allergen Reactions  . Penicillins Other (See Comments)    Pass out Has patient had a PCN reaction causing  immediate rash, facial/tongue/throat swelling, SOB or lightheadedness with hypotension: Yes Has patient had a PCN reaction causing severe rash involving mucus membranes or skin necrosis: No Has patient had a PCN reaction that required hospitalization: No Has patient had a PCN reaction occurring within the last 10 years: No If all of the above answers are "NO", then may proceed with Cephalosporin use.   . Ace Inhibitors Nausea Only  . Losartan Anxiety  . Spironolactone Other (See Comments)    Felt poorly but couldn't clarify   . Sulfa Antibiotics Nausea Only  . Sulfasalazine Nausea And Vomiting          Home Medications  Prior to Admission medications   Medication Sig Start Date End Date Taking? Authorizing Provider  acetaminophen (TYLENOL) 500 MG tablet Take 1,000 mg by mouth 2 (two) times daily as needed for moderate pain or headache.    [provider]  alendronate (FOSAMAX) 70 MG tablet Take 70 mg by mouth every Saturday.     [provider]  amLODipine (NORVASC) 10 MG tablet Take  10 mg by mouth daily. 08/15/18   [provider]  fluticasone (FLONASE) 50 MCG/ACT nasal spray Place 1 spray into both nostrils daily as needed for allergies or rhinitis.    [provider]  HYDROcodone-acetaminophen (NORCO/VICODIN) 5-325 MG tablet Take 1 tablet by mouth 2 (two) times daily as needed (pain.). 06/04/20   [provider]  hydroxychloroquine (PLAQUENIL) 200 MG tablet Take 200 mg by mouth 2 (two) times daily.  01/22/14   [provider]  oxyCODONE-acetaminophen (PERCOCET) 5-325 MG tablet Take 1-2 tablets by mouth every 6 (six) hours as needed for severe pain. Max 6 tabs per day 07/25/20   Samara Deist, DPM  polyethylene glycol [facility] / GLYCOLAX) packet Take 17 g by mouth daily.    [provider]  predniSONE (DELTASONE) 5 MG tablet Take 5 mg by mouth daily with breakfast.  12/12/13   [provider]  Pseudoephedrine-APAP  30-325 MG TABS Take 1 tablet by mouth daily as needed (sinuses).     [provider]  simvastatin (ZOCOR) 40 MG tablet Take 40 mg by mouth every evening. 08/15/14   [provider]     Critical care time: 63 minutes     Marda Stalker, [place] Pager [phone] (please enter 7 digits) PCCM Consult Pager [phone] (please enter 7 digits)

## 2020-07-29 NOTE — Progress Notes (Signed)
PROGRESS NOTE    [name]  GEX:[id number] DOB: [birth year] DOA: 08/18/2020 PCP: [name], MD  Assessment & Plan:   Active Problems:   Systemic lupus (HCC)   Hypertension   Diffuse large B cell lymphoma (HCC)   Atherosclerotic heart disease of native coronary artery without angina pectoris   Diastolic heart failure (HCC)   Hx of CABG   RLL pneumonia   Acute respiratory failure with hypoxia (HCC)   AKI (acute kidney injury) ([place])   History of toe surgery 07/25/20   Acute on chronic diastolic CHF (congestive heart failure) (HCC)   NSTEMI (non-ST elevated myocardial infarction) ([place])   Non-STEMI (non-ST elevated myocardial infarction) ([place])     Acute hypoxic respiratory failure: likely secondary decompensated CHF. EMS reporting O2 sat of 83% on room air requiring 2 to 3 L to keep sats in the mid 90s. Etiology unclear. Continue on supplemental oxygen and wean as tolerated. Continue on BiPAP and wean as tolerated   Acute on chronic diastolic CHF: continue on IV lasix & IV nitro as per cardio. Monitor I/Os. Cardio following and recs apprec   NSTEMI: continue on IV heparin & IV nitro as per cardio. Continue on metoprolol, statin. S/p cardiac cath w/ coronary artery not amenable to stent placement. Hx of CABG. If pt decompensates further and requires intubation/ventilation, pt will be transferred to [facility] for advanced HF team to take over  Possible sepsis : sepsis not suspected as pt has tachycardia, tachypneic, elevated lactic acid but no source of infection. COVID19 neg. Procal < 0.10   Leukocytosis: likely reactive. Will continue to monitor   AKI: baseline Cr is 0.98. Cr is trending up today. Avoid nephrotoxic meds    SLE: continue on home dose of hydroxychloroquine  HTN: continue on metoprolol, lasix & nitro as per cardio   Diffuse large B cell lymphoma: management per oncology outpatient, Dr. Mike Gip   DVT prophylaxis: heparin drip  Code Status: full   Family Communication: discussed pt's care w/ pt's family at bedside and answered their questions  Disposition Plan: unclear at this time    Status is: Inpatient  Remains inpatient appropriate because:Hemodynamically unstable, Ongoing diagnostic testing needed not appropriate for outpatient work up, IV treatments appropriate due to intensity of illness or inability to take PO and Inpatient level of care appropriate due to severity of illness   Dispo: The patient is from: Home              Anticipated d/c is to: unclear              Anticipated d/c date is: > 3 days              Patient currently is not medically stable to d/c.     Consultants:  Cardio Vascular surg   Procedures:   Antimicrobials:    Subjective: Pt c/o shortness of breath   Objective: Vitals:   07/29/20 0600 07/29/20 0700 07/29/20 0730 07/29/20 0800  BP: (!) 86/59 (!) 140/91  123/84  Pulse: 86 (!) 112  97  Resp: (!) 27 (!) 42  (!) 23  Temp:      TempSrc:      SpO2: 100% (!) 87% 97% 93%  Weight:      Height:        Intake/Output Summary (Last 24 hours) at 07/29/2020 0924 Last data filed at 07/29/2020 0300 Gross per 24 hour  Intake 20.4 ml  Output 450 ml  Net -429.6 ml  Filed Weights   08/08/2020 0300 07/29/20 0500  Weight: 53.1 kg 54.2 kg    Examination:  General exam: Appears calm but uncomfortable  Respiratory system: diminished breath sounds b/l  Cardiovascular system: S1 & S2 +. No rubs, gallops or clicks.  Gastrointestinal system: Abdomen is nondistended, soft and nontender. Normal bowel sounds heard. Central nervous system: Alert and oriented. Moves all extremities  Psychiatry: Judgement and insight appear normal. Flat mood and affect    Data Reviewed: I have personally reviewed following labs and imaging studies  CBC: Recent Labs  Lab 08/07/2020 0317 07/29/20 0735  WBC 10.9* 11.2*  NEUTROABS 8.6*  --   HGB 10.5* 9.8*  HCT 34.2* 30.8*  MCV 100.0 98.1  PLT 181 191    Basic Metabolic Panel: Recent Labs  Lab 08/18/2020 0317 07/29/20 0735  NA 139 135  K 3.4* 4.0  CL 99 98  CO2 24 19*  GLUCOSE 105* 152*  BUN 26* 40*  CREATININE 1.45* 1.66*  CALCIUM 8.9 8.6*   GFR: Estimated Creatinine Clearance: 23 mL/min (A) (by C-G formula based on SCr of 1.66 mg/dL (H)). Liver Function Tests: Recent Labs  Lab 08/01/2020 0317  AST 54*  ALT 13  ALKPHOS 60  BILITOT 0.8  PROT 6.2*  ALBUMIN 3.7   No results for input(s): LIPASE, AMYLASE in the last 168 hours. No results for input(s): AMMONIA in the last 168 hours. Coagulation Profile: Recent Labs  Lab 08/17/2020 0500  INR 1.0   Cardiac Enzymes: No results for input(s): CKTOTAL, CKMB, CKMBINDEX, TROPONINI in the last 168 hours. BNP (last 3 results) No results for input(s): PROBNP in the last 8760 hours. HbA1C: No results for input(s): HGBA1C in the last 72 hours. CBG: Recent Labs  Lab 07/25/2020 1104  GLUCAP 134*   Lipid Profile: No results for input(s): CHOL, HDL, LDLCALC, TRIG, CHOLHDL, LDLDIRECT in the last 72 hours. Thyroid Function Tests: No results for input(s): TSH, T4TOTAL, FREET4, T3FREE, THYROIDAB in the last 72 hours. Anemia Panel: No results for input(s): VITAMINB12, FOLATE, FERRITIN, TIBC, IRON, RETICCTPCT in the last 72 hours. Sepsis Labs: Recent Labs  Lab 08/04/2020 0317 08/03/2020 0500  PROCALCITON <0.10  --   LATICACIDVEN 2.0* 2.5*    Recent Results (from the past 240 hour(s))  SARS CORONAVIRUS 2 (TAT 6-24 HRS) Nasopharyngeal Nasopharyngeal Swab     Status: None   Collection Time: 07/23/20 10:17 AM   Specimen: Nasopharyngeal Swab  Result Value Ref Range Status   SARS Coronavirus 2 NEGATIVE NEGATIVE Final    Comment: (NOTE) SARS-CoV-2 target nucleic acids are NOT DETECTED.  The SARS-CoV-2 RNA is generally detectable in upper and lower respiratory specimens during the acute phase of infection. Negative results do not preclude SARS-CoV-2 infection, do not rule  out co-infections with other pathogens, and should not be used as the sole basis for treatment or other patient management decisions. Negative results must be combined with clinical observations, patient history, and epidemiological information. The expected result is Negative.  Fact Sheet for Patients: SugarRoll.be  Fact Sheet for Healthcare Providers: https://www.woods-mathews.com/  This test is not yet approved or cleared by the [place] FDA and  has been authorized for detection and/or diagnosis of SARS-CoV-2 by FDA under an Emergency Use Authorization (EUA). This EUA will remain  in effect (meaning this test can be used) for the duration of the COVID-19 declaration under Se ction 564(b)(1) of the Act, 21 U.S.C. section 360bbb-3(b)(1), unless the authorization is terminated or revoked sooner.  Performed at [facility], [place]  [facility], [place] [address] [place], [place] [address]   Culture, blood (routine x 2)     Status: None (Preliminary result)   Collection Time: 08/14/2020  3:19 AM   Specimen: BLOOD  Result Value Ref Range Status   Specimen Description BLOOD LEFT ANTECUBITAL  Final   Special Requests   Final    BOTTLES DRAWN AEROBIC AND ANAEROBIC Blood Culture adequate volume   Culture   Final    NO GROWTH 1 DAY Performed at [facility], [address] [place], [place] [address]    Report Status PENDING  Incomplete  Culture, blood (routine x 2)     Status: None (Preliminary result)   Collection Time: 08/01/2020  3:19 AM   Specimen: BLOOD  Result Value Ref Range Status   Specimen Description BLOOD BLOOD RIGHT ARM  Final   Special Requests   Final    BOTTLES DRAWN AEROBIC AND ANAEROBIC Blood Culture adequate volume   Culture   Final    NO GROWTH 1 DAY Performed at [facility], [address] [place], [place] [address]    Report Status PENDING  Incomplete  Resp Panel by RT-PCR (Flu A&B, Covid) Nasopharyngeal  Swab     Status: None   Collection Time: 08/10/2020  4:42 AM   Specimen: Nasopharyngeal Swab; Nasopharyngeal(NP) swabs in vial transport medium  Result Value Ref Range Status   SARS Coronavirus 2 by RT PCR NEGATIVE NEGATIVE Final    Comment: (NOTE) SARS-CoV-2 target nucleic acids are NOT DETECTED.  The SARS-CoV-2 RNA is generally detectable in upper respiratory specimens during the acute phase of infection. The lowest concentration of SARS-CoV-2 viral copies this assay can detect is 138 copies/mL. A negative result does not preclude SARS-Cov-2 infection and should not be used as the sole basis for treatment or other patient management decisions. A negative result may occur with  improper specimen collection/handling, submission of specimen other than nasopharyngeal swab, presence of viral mutation(s) within the areas targeted by this assay, and inadequate number of viral copies(<138 copies/mL). A negative result must be combined with clinical observations, patient history, and epidemiological information. The expected result is Negative.  Fact Sheet for Patients:  EntrepreneurPulse.com.au  Fact Sheet for Healthcare Providers:  IncredibleEmployment.be  This test is no t yet approved or cleared by the [place] FDA and  has been authorized for detection and/or diagnosis of SARS-CoV-2 by FDA under an Emergency Use Authorization (EUA). This EUA will remain  in effect (meaning this test can be used) for the duration of the COVID-19 declaration under Section 564(b)(1) of the Act, 21 U.S.C.section 360bbb-3(b)(1), unless the authorization is terminated  or revoked sooner.       Influenza A by PCR NEGATIVE NEGATIVE Final   Influenza B by PCR NEGATIVE NEGATIVE Final    Comment: (NOTE) The Xpert Xpress SARS-CoV-2/FLU/RSV plus assay is intended as an aid in the diagnosis of influenza from Nasopharyngeal swab specimens and should not be used as a sole  basis for treatment. Nasal washings and aspirates are unacceptable for Xpert Xpress SARS-CoV-2/FLU/RSV testing.  Fact Sheet for Patients: EntrepreneurPulse.com.au  Fact Sheet for Healthcare Providers: IncredibleEmployment.be  This test is not yet approved or cleared by the [place] FDA and has been authorized for detection and/or diagnosis of SARS-CoV-2 by FDA under an Emergency Use Authorization (EUA). This EUA will remain in effect (meaning this test can be used) for the duration of the COVID-19 declaration under Section 564(b)(1) of the Act, 21  U.S.C. section 360bbb-3(b)(1), unless the authorization is terminated or revoked.  Performed at [facility], [address] [place], [place] [address]   MRSA PCR Screening     Status: None   Collection Time: 08/12/2020 11:16 AM   Specimen: Nasal Mucosa; Nasopharyngeal  Result Value Ref Range Status   MRSA by PCR NEGATIVE NEGATIVE Final    Comment:        The GeneXpert MRSA Assay (FDA approved for NASAL specimens only), is one component of a comprehensive MRSA colonization surveillance program. It is not intended to diagnose MRSA infection nor to guide or monitor treatment for MRSA infections. Performed at [facility], [address], [place], [place] [address]          Radiology Studies: DG Chest 2 View  Result Date: 07/22/2020 CLINICAL DATA:  Shortness of breath EXAM: CHEST - 2 VIEW COMPARISON:  January 08, 2019 FINDINGS: The heart size is stable. The previously demonstrated right-sided Port-A-Cath has been removed. There are fibrotic changes centrally likely related to post treatment changes. There are new airspace opacities in the right lower lobe with a probable small right-sided pleural effusion. There is fullness of the right hilum. There are Kerley B lines bilaterally. There is no pneumothorax. There is a compression fracture of the T12 vertebral body, chronic  appearing. IMPRESSION: 1. New airspace opacities in the right lower lobe concerning for pneumonia. 2. Probable small right-sided pleural effusion. 3. Mild interstitial edema. Electronically Signed   By: Constance Holster M.D.   On: 08/17/2020 03:27   CARDIAC CATHETERIZATION  Result Date: 07/29/2020  Mid LM to Dist LM lesion is 100% stenosed.  Mid RCA lesion is 100% stenosed.  Post intervention, there is a 100% residual stenosis.  Ost LAD lesion is 100% stenosed.  Mid LAD lesion is 100% stenosed.  Origin lesion is 100% stenosed.  100% occluded circumflex at the ostium  100% occluded vein graft to circumflex system probable IRA  Widely patent LIMA to mid LAD  Extensive collaterals from LAD system to circumflex as well as distal RCA  Severely depressed left ventricular function globally left ventricular enlargement ejection fraction less than 25%  Unsuccessful attempt to intervene on the mid right coronary artery because of the CTO  Mynx was deployed  Clinical support was deferred since the patient did not appear to be in cardiogenic shock at the time    DG Chest Port 1 View  Result Date: 07/29/2020 CLINICAL DATA:  Congestive heart failure EXAM: PORTABLE CHEST 1 VIEW COMPARISON:  July 28, 2020 FINDINGS: There is cardiomegaly. The patient is status post prior median sternotomy. There are growing bilateral pleural effusions. There are worsening interstitial lung markings. There is fluid within the pleural fissures. There is no pneumothorax. No acute osseous abnormality. IMPRESSION: Worsening interstitial edema with growing bilateral pleural effusions. Electronically Signed   By: Constance Holster M.D.   On: 07/29/2020 02:29        Scheduled Meds: . aspirin EC  81 mg Oral Daily  . Chlorhexidine Gluconate Cloth  6 each Topical Daily  . hydroxychloroquine  200 mg Oral BID  . predniSONE  5 mg Oral Q breakfast  . simvastatin  40 mg Oral QPM   Continuous Infusions: . heparin 650  Units/hr (07/22/2020 1900)     LOS: 1 day    Time spent: 30 mins     Wyvonnia Dusky, MD [facility] Pager [phone]  If 7PM-7AM, please contact night-coverage 07/29/2020, 9:24 AM

## 2020-07-29 NOTE — Progress Notes (Signed)
[place] for Heparin Infusion Indication: chest pain/ACS /STEMI   Allergies  Allergen Reactions  . Penicillins Other (See Comments)    Pass out Has patient had a PCN reaction causing immediate rash, facial/tongue/throat swelling, SOB or lightheadedness with hypotension: Yes Has patient had a PCN reaction causing severe rash involving mucus membranes or skin necrosis: No Has patient had a PCN reaction that required hospitalization: No Has patient had a PCN reaction occurring within the last 10 years: No If all of the above answers are "NO", then may proceed with Cephalosporin use.   . Ace Inhibitors Nausea Only  . Losartan Anxiety  . Spironolactone Other (See Comments)    Felt poorly but couldn't clarify   . Sulfa Antibiotics Nausea Only  . Sulfasalazine Nausea And Vomiting         Patient Measurements: Height: 4\' 11"  (149.9 cm) Weight: 53.1 kg (117 lb) IBW/kg (Calculated) : 43.2 Heparin Dosing Weight: 53.1kg  Vital Signs: Temp: 98.2 F (36.8 C) (01/11 0000) Temp Source: Oral (01/11 0000) BP: 122/85 (01/11 0300) Pulse Rate: 89 (01/11 0300)  Labs: Recent Labs    07/29/2020 0317 07/25/2020 0500 07/29/20 0014  HGB 10.5*  --   --   HCT 34.2*  --   --   PLT 181  --   --   APTT  --  24  --   LABPROT  --  12.8  --   INR  --  1.0  --   HEPARINUNFRC  --   --  0.36  CREATININE 1.45*  --   --   TROPONINIHS 5,038* 8,440*  --     Estimated Creatinine Clearance: 26.1 mL/min (A) (by C-G formula based on SCr of 1.45 mg/dL (H)).   Medical History: Past Medical History:  Diagnosis Date  . Anemia    VITAMIN B12 DEFICIENCY  . Arthritis   . Cancer [facility])    Stomach cancer  . Cardiomyopathy ([place])   . CHF (congestive heart failure) ([place])   . Chronic kidney disease    STAGE IV KIDNEY DISEASE  . COPD (chronic obstructive pulmonary disease) ([place])   . DDD (degenerative disc disease), cervical   . GERD (gastroesophageal reflux disease)   .  HTN (hypertension)   . Hyperlipidemia   . Lupus ([place])   . 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 Peripheral vascular disease ([place])   . Polyp of ascending colon 05/2020   several small polyps (precancerous) removed.  . Raynaud's disease   . S/P angioplasty with stent   . Wears dentures    full upper and lower   Pt is 75 yo female presenting to ED with acute respiratory distress.  Pt hx  medical history significant for SLE, diffuse large B-cell lymphoma followed by Dr. Mike Gip, HTN, CAD s/p CABG, COPD, diastolic heart failure, who is status post recent great toe surgery on 07/25/20.   Goal of Therapy:  Heparin level 0.3-0.7 units/ml Monitor platelets by anticoagulation protocol: Yes   Plan:  1/11:  HL @ 0014 = 0.36 Will continue pt on current rate and recheck HL on 1/11 @ 0800.    Mahogany Torrance D 07/29/2020 4:55 AM

## 2020-07-29 NOTE — Progress Notes (Signed)
[place] for Heparin Infusion Indication: chest pain/ACS /STEMI   Allergies  Allergen Reactions  . Penicillins Other (See Comments)    Pass out Has patient had a PCN reaction causing immediate rash, facial/tongue/throat swelling, SOB or lightheadedness with hypotension: Yes Has patient had a PCN reaction causing severe rash involving mucus membranes or skin necrosis: No Has patient had a PCN reaction that required hospitalization: No Has patient had a PCN reaction occurring within the last 10 years: No If all of the above answers are "NO", then may proceed with Cephalosporin use.   . Ace Inhibitors Nausea Only  . Losartan Anxiety  . Spironolactone Other (See Comments)    Felt poorly but couldn't clarify   . Sulfa Antibiotics Nausea Only  . Sulfasalazine Nausea And Vomiting         Patient Measurements: Height: 4\' 11"  (149.9 cm) Weight: 54.2 kg (119 lb 7.8 oz) IBW/kg (Calculated) : 43.2 Heparin Dosing Weight: 53.1kg  Vital Signs: Temp: 97.4 F (36.3 C) (01/11 0500) Temp Source: Oral (01/11 0500) BP: 123/84 (01/11 0800) Pulse Rate: 97 (01/11 0800)  Labs: Recent Labs    08/01/2020 0317 07/29/2020 0500 07/29/20 0014 07/29/20 0735  HGB 10.5*  --   --  9.8*  HCT 34.2*  --   --  30.8*  PLT 181  --   --  174  APTT  --  24  --   --   LABPROT  --  12.8  --   --   INR  --  1.0  --   --   HEPARINUNFRC  --   --  0.36 0.48  CREATININE 1.45*  --   --  1.66*  TROPONINIHS 9,629* 8,440*  --   --     Estimated Creatinine Clearance: 23 mL/min (A) (by C-G formula based on SCr of 1.66 mg/dL (H)).   Medical History: Past Medical History:  Diagnosis Date  . Anemia    VITAMIN B12 DEFICIENCY  . Arthritis   . Cancer [facility])    Stomach cancer  . Cardiomyopathy ([place])   . CHF (congestive heart failure) ([place])   . Chronic kidney disease    STAGE IV KIDNEY DISEASE  . COPD (chronic obstructive pulmonary disease) ([place])   . DDD (degenerative disc  disease), cervical   . GERD (gastroesophageal reflux disease)   . HTN (hypertension)   . Hyperlipidemia   . Lupus ([place])   . 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 Peripheral vascular disease ([place])   . Polyp of ascending colon 05/2020   several small polyps (precancerous) removed.  . Raynaud's disease   . S/P angioplasty with stent   . Wears dentures    full upper and lower   Pt is 75 yo female presenting to ED with acute respiratory distress.  Pt hx  medical history significant for SLE, diffuse large B-cell lymphoma followed by Dr. Mike Gip, HTN, CAD s/p CABG, COPD, diastolic heart failure, who is status post recent great toe surgery on 07/25/20.   Goal of Therapy:  Heparin level 0.3-0.7 units/ml Monitor platelets by anticoagulation protocol: Yes   Plan:  1/11 0735 HL 0.48, therapeutic x 2. Continue heparin drip at 650 units/hr. HL and CBC with morning labs. Follow Cardiology plan for heparin drip.   Tawnya Crook, PharmD Clinical Pharmacist 07/29/2020 9:34 AM

## 2020-07-29 NOTE — Progress Notes (Signed)
Met the husband of patient at bedside. Patient was not awake, but had just finished eating lunch, which was a positive thing for the husband. I asked about his  wife, and how long they had been married, his answer was: " in March, 54 years". At this point he became emotional. After offering words of comfort, I prayed for the patient and husband. I let him know Chaplain's are available if and when needed.

## 2020-07-29 NOTE — Progress Notes (Signed)
   07/29/20 1050  Clinical Encounter Type  Visited With Patient and family together  Visit Type Follow-up  Referral From Chaplain  Consult/Referral To Chaplain   Chaplain briefly visited with Pt and her husband. Husband said Pt isn't doing well. Chaplain asked if he had spoken with his daughter and he said yes, she has been up to see her mother. Chaplain rubbed Pt's shoulder and she smiled. Pt told husband if he needs anything to  let staff know.

## 2020-07-29 NOTE — Progress Notes (Signed)
Very brief visit w/pt. and her husband Judy Torres at bedside.  Pt. lying in bed wearing BiPap --> difficult for her to speak.  CH remains available as needed.

## 2020-07-29 NOTE — Consult Note (Signed)
Cardiology Consultation:   Patient ID: Judy Daaiyah Baumert; [id number]; [birth year]   Admit date: 07/23/2020 Date of Consult: 07/29/2020  Primary Care Provider: [name], MD Primary Cardiologist: Rockey Situ Primary Electrophysiologist:  None   Patient Profile:   Judy Torres is a 75 y.o. 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 Damita Dunnings.  History of Present Illness:   Ms. Kibble was previously followed by Dr. Nehemiah Massed, then has subsequently transitioned her care to Dr. Rockey Situ. She underwent CABG Judy 2004. Exercise stress test Judy 2019 showed no evidence of significant ischemia and was low risk.  Echo Judy 03/2018 showed an EF of 55 to 60%.  Repeat echo Judy 07/2018 showed an EF of 50 to 01%, grade 1 diastolic dysfunction, and mild to moderate tricuspid regurgitation.  Echo Judy 03/2019 showed an EF of 45 to 50%, diffuse hypokinesis, normal RV systolic function and ventricular cavity size, mildly elevated PASP, mildly dilated right atrium, degenerative mitral valve with mild mitral annular calcification, and mild to moderate tricuspid regurgitation.  She presented to [facility] Judy the early morning hours on 07/19/2020 with a 4 days history of chest tightness and worsening dyspnea. She had recently undergone left toe surgery on 07/25/2020 without significant complications.  Upon EMS arrival she was noted to be hypoxic with O2 saturations of 83% on room air and was placed on supplemental oxygen via nasal cannula at 3 L.  Initial EKG showed sinus rhythm, this is not available for review.  She was tachycardic and tachypneic with stable, though soft BP.  Initial BNP greater than 3500.  Initial troponin 9838 and subsequently downtrending.  COVID-negative.  PCT less than 0.1.  Serum creatinine 1.45 with  a baseline around 1.  Chest x-ray showed new airspace opacities Judy the right lower lobe concerning for pneumonia with mild interstitial edema.  She was given IV Lasix 20 mg, IV Solu-Medrol, Levaquin, lactated Ringer's, and was placed on a heparin drip and received DuoNeb Judy the ED.  EKG performed at 0726 on 1/10 showed complete heart block, 40 bpm.  She was given 2 doses of atropine with temporary improvement Judy rate.  While the ED was discussing the case with the STEMI MD, she developed VT with ventricular rates Judy the 130s bpm.  She underwent successful synchronized cardioversion with return to sinus tachycardia and stable BP.Judy Torres  She underwent emergent LHC by Dr. Clayborn Bigness Judy the setting of complete heart block and VT with NSTEMI on 08/07/2020 which showed severe underlying multivessel CAD including an occluded mid to distal left main, occluded ostial to mid LAD, distal LCx filled by collaterals from the distal LAD, occluded mid RCA, RPDA filled from collaterals with the distal LAD, patent LIMA to mid LAD, and occluded SVG to mid LCx.  Intervention was attempted on the native mid RCA lesion though was unsuccessful due to inability to cross the lesion with wire.  LV gram with an EF of less than 25%.  And rounding on the patient following cath with Dr. Rockey Situ patient reported she was feeling well and denied any chest pain or dyspnea.  Troponin peaked at 9838 and is subsequently downtrending BNP has trended to greater than 4500.  Hemoglobin 9.8 with a baseline around 12.  BUN/SCR 40/1.66 with a  baseline serum creatinine around 1.  Repeat chest x-ray today showed worsening interstitial edema with growing bilateral pleural effusions.  This morning she has required BiPAP secondary to respiratory distress and at time of cardiology consultation with Dr. Saunders Revel she was on nonrebreather and using accessory muscles.  She continued to note dyspnea.  Judy this setting she agreed to be placed back on BiPAP.  Husband and nurse at  bedside.  She denies any chest pain though does continue to report significant dyspnea.  No lower extremity swelling.   Past Medical History:  Diagnosis Date  . Anemia    VITAMIN B12 DEFICIENCY  . Arthritis   . Cancer [facility])    Stomach cancer  . Cardiomyopathy ([place])   . CHF (congestive heart failure) ([place])   . Chronic kidney disease    STAGE IV KIDNEY DISEASE  . COPD (chronic obstructive pulmonary disease) ([place])   . DDD (degenerative disc disease), cervical   . GERD (gastroesophageal reflux disease)   . HTN (hypertension)   . Hyperlipidemia   . Lupus ([place])   . 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 Peripheral vascular disease ([place])   . Polyp of ascending colon 05/2020   several small polyps (precancerous) removed.  . Raynaud's disease   . S/P angioplasty with stent   . Wears dentures    full upper and lower    Past Surgical History:  Procedure Laterality Date  . ABDOMINAL HYSTERECTOMY    . AORTO-FEMORAL BYPASS GRAFT Left 2003  . ARTHRODESIS METATARSALPHALANGEAL JOINT (MTPJ) Left 07/25/2020   Procedure: ARTHRODESIS 1ST METATARSALPHALANGEAL JOINT (MTPJ) LEFT;  Surgeon: Samara Deist, DPM;  Location: [facility] ORS;  Service: Podiatry;  Laterality: Left;  . CARDIAC CATHETERIZATION     stent  . CARDIAC SURGERY    . CATARACT EXTRACTION W/PHACO Right 11/15/2019   Procedure: CATARACT EXTRACTION PHACO AND INTRAOCULAR LENS PLACEMENT (IOC) RIGHT  VISION BLUE 7.48 00:45.8;  Surgeon: Marchia Meiers, MD;  Location: [place];  Service: Ophthalmology;  Laterality: Right;  Port a cath  . CATARACT EXTRACTION W/PHACO Left 12/20/2019   Procedure: CATARACT EXTRACTION PHACO AND INTRAOCULAR LENS PLACEMENT ([place]) LEFT VISION BLUE;  Surgeon: Marchia Meiers, MD;  Location: [place];  Service: Ophthalmology;  Laterality: Left;  6.170:36.1  . CESAREAN SECTION    . COLONOSCOPY N/A 12/30/2017   Procedure: COLONOSCOPY;  Surgeon: Lin Landsman, MD;  Location: [facility] ENDOSCOPY;  Service:  Gastroenterology;  Laterality: N/A;  . COLONOSCOPY WITH PROPOFOL N/A 05/23/2020   Procedure: COLONOSCOPY WITH PROPOFOL;  Surgeon: Virgel Manifold, MD;  Location: [facility] ENDOSCOPY;  Service: Endoscopy;  Laterality: N/A;  . CORONARY ARTERY BYPASS GRAFT  2004  . CT SIM FOR RADIATION THERAPY ([place] HX)  2020  . ESOPHAGOGASTRODUODENOSCOPY N/A 12/30/2017   Procedure: ESOPHAGOGASTRODUODENOSCOPY (EGD);  Surgeon: Lin Landsman, MD;  Location: [facility] ENDOSCOPY;  Service: Gastroenterology;  Laterality: N/A;  . ESOPHAGOGASTRODUODENOSCOPY Left 02/05/2018   Procedure: ESOPHAGOGASTRODUODENOSCOPY (EGD);  Surgeon: Virgel Manifold, MD;  Location: [facility] ENDOSCOPY;  Service: Endoscopy;  Laterality: Left;  . ESOPHAGOGASTRODUODENOSCOPY (EGD) WITH PROPOFOL N/A 03/15/2018   Procedure: ESOPHAGOGASTRODUODENOSCOPY (EGD) WITH PROPOFOL;  Surgeon: Virgel Manifold, MD;  Location: [facility] ENDOSCOPY;  Service: Endoscopy;  Laterality: N/A;  . EYE SURGERY    . FLEXIBLE SIGMOIDOSCOPY N/A 12/31/2017   Procedure: FLEXIBLE SIGMOIDOSCOPY;  Surgeon: Jonathon Bellows, MD;  Location: [facility] ENDOSCOPY;  Service: Gastroenterology;  Laterality: N/A;  . PORTA CATH INSERTION N/A 04/19/2018   Procedure: PORTA CATH INSERTION;  Surgeon: Lucky Cowboy,  Erskine Squibb, MD;  Location: [place] CV LAB;  Service: Cardiovascular;  Laterality: N/A;  . PORTA CATH REMOVAL N/A 02/28/2020   Procedure: PORTA CATH REMOVAL;  Surgeon: Algernon Huxley, MD;  Location: [place] CV LAB;  Service: Cardiovascular;  Laterality: N/A;     Home Meds: Prior to Admission medications   Medication Sig Start Date End Date Taking? Authorizing Provider  acetaminophen (TYLENOL) 500 MG tablet Take 1,000 mg by mouth 2 (two) times daily as needed for moderate pain or headache.    [provider]  alendronate (FOSAMAX) 70 MG tablet Take 70 mg by mouth every Saturday.     [provider]  amLODipine (NORVASC) 10 MG tablet Take 10 mg by mouth daily. 08/15/18   [provider]  fluticasone (FLONASE) 50 MCG/ACT nasal spray Place 1 spray into both nostrils daily as needed for allergies or rhinitis.    [provider]  HYDROcodone-acetaminophen (NORCO/VICODIN) 5-325 MG tablet Take 1 tablet by mouth 2 (two) times daily as needed (pain.). 06/04/20   [provider]  hydroxychloroquine (PLAQUENIL) 200 MG tablet Take 200 mg by mouth 2 (two) times daily.  01/22/14   [provider]  oxyCODONE-acetaminophen (PERCOCET) 5-325 MG tablet Take 1-2 tablets by mouth every 6 (six) hours as needed for severe pain. Max 6 tabs per day 07/25/20   Samara Deist, DPM  polyethylene glycol [facility] / GLYCOLAX) packet Take 17 g by mouth daily.    [provider]  predniSONE (DELTASONE) 5 MG tablet Take 5 mg by mouth daily with breakfast.  12/12/13   [provider]  Pseudoephedrine-APAP 30-325 MG TABS Take 1 tablet by mouth daily as needed (sinuses).     [provider]  simvastatin (ZOCOR) 40 MG tablet Take 40 mg by mouth every evening. 08/15/14   [provider]    Inpatient Medications: Scheduled Meds: . aspirin EC  81 mg Oral Daily  . Chlorhexidine Gluconate Cloth  6 each Topical Daily  . hydroxychloroquine  200 mg Oral BID  . predniSONE  5 mg Oral Q breakfast  . simvastatin  40 mg Oral QPM   Continuous Infusions: . heparin 650 Units/hr (08/11/2020 1900)   PRN Meds: acetaminophen, atropine, fluticasone, HYDROcodone-acetaminophen, nitroGLYCERIN, ondansetron (ZOFRAN) IV  Allergies:   Allergies  Allergen Reactions  . Penicillins Other (See Comments)    Pass out Has patient had a PCN reaction causing immediate rash, facial/tongue/throat swelling, SOB or lightheadedness with hypotension: Yes Has patient had a PCN reaction causing severe rash involving mucus membranes or skin necrosis: No Has patient had a PCN reaction that required hospitalization: No Has patient had a PCN reaction occurring within the last 10  years: No If all of the above answers are "NO", then may proceed with Cephalosporin use.   . Ace Inhibitors Nausea Only  . Losartan Anxiety  . Spironolactone Other (See Comments)    Felt poorly but couldn't clarify   . Sulfa Antibiotics Nausea Only  . Sulfasalazine Nausea And Vomiting         Social History:   Social History   Socioeconomic History  . Marital status: Married    Spouse name: Marcello Moores  . Number of children: Not on file  . Years of education: Not on file  . Highest education level: Not on file  Occupational History    Comment: RETIRED  Tobacco Use  . Smoking status: Former Smoker    Packs/day: 0.50    Years: 20.00  Pack years: 10.00    Types: Cigarettes    Quit date: 03/07/2002    Years since quitting: 18.4  . Smokeless tobacco: Never Used  Vaping Use  . Vaping Use: Never used  Substance and Sexual Activity  . Alcohol use: Not Currently  . Drug use: Never  . Sexual activity: Not Currently  Other Topics Concern  . Not on file  Social History Narrative   Patient lives Judy home with her husband. She feels safe at home.   States she is active.   Social Determinants of Health   Financial Resource Strain: Not on file  Food Insecurity: Not on file  Transportation Needs: Not on file  Physical Activity: Not on file  Stress: Not on file  Social Connections: Not on file  Intimate Partner Violence: Not on file     Family History:   Family History  Problem Relation Age of Onset  . Hypertension Mother   . Stroke Mother   . Vascular Disease Mother   . Hypertension Sister     ROS:  Review of Systems  Constitutional: Positive for malaise/fatigue. Negative for chills, diaphoresis, fever and weight loss.  HENT: Negative for congestion.   Eyes: Negative for discharge and redness.  Respiratory: Positive for shortness of breath. Negative for cough, sputum production and wheezing.   Cardiovascular: Negative for chest pain, palpitations, orthopnea,  claudication, leg swelling and PND.  Gastrointestinal: Negative for abdominal pain, heartburn, nausea and vomiting.  Musculoskeletal: Negative for falls and myalgias.  Skin: Negative for rash.  Neurological: Positive for weakness. Negative for dizziness, tingling, tremors, sensory change, speech change, focal weakness and loss of consciousness.  Endo/Heme/Allergies: Does not bruise/bleed easily.  Psychiatric/Behavioral: Negative for substance abuse. The patient is not nervous/anxious.   All other systems reviewed and are negative.     Physical Exam/Data:   Vitals:   07/29/20 0600 07/29/20 0700 07/29/20 0730 07/29/20 0800  BP: (!) 86/59 (!) 140/91  123/84  Pulse: 86 (!) 112  97  Resp: (!) 27 (!) 42  (!) 23  Temp:      TempSrc:      SpO2: 100% (!) 87% 97% 93%  Weight:      Height:        Intake/Output Summary (Last 24 hours) at 07/29/2020 0919 Last data filed at 07/29/2020 0300 Gross per 24 hour  Intake 20.4 ml  Output 450 ml  Net -429.6 ml   Filed Weights   08/02/2020 0300 07/29/20 0500  Weight: 53.1 kg 54.2 kg   Body mass index is 24.13 kg/m.   Physical Exam: General: Well developed, well nourished, critically ill-appearing. Head: Normocephalic, atraumatic, sclera non-icteric, no xanthomas, nares without discharge.  Neck: Negative for carotid bruits. JVD elevated to the angle of the mandible. Lungs: Diminished breath sounds bilaterally, particularly at the bases with crackles noted along the mid lung fields.  Accessory muscle use noted on nonrebreather.  Patient placed back on BiPAP. Heart: RRR with S1 S2. II/VI systolic murmur at the LSB, no rubs, or gallops appreciated. Abdomen: Soft, non-tender, non-distended with normoactive bowel sounds. No hepatomegaly. No rebound/guarding. No obvious abdominal masses. Msk:  Strength and tone appear normal for age. Extremities: No clubbing or cyanosis. No edema. Distal pedal pulses are 2+ and equal bilaterally. Neuro: Alert and  oriented X 3. No facial asymmetry. No focal deficit. Moves all extremities spontaneously. Psych:  Responds to questions appropriately with a normal affect.   EKG:  The EKG was personally reviewed and demonstrates: [id number]  complete heart block, 40 bpm.  Repeat EKG at 757 showed atrial tach versus sinus tach , 122 bpm, bifascicular block Telemetry:  Telemetry was personally reviewed and demonstrates: SR  Weights: [employer]   08/11/2020 0300 07/29/20 0500  Weight: 53.1 kg 54.2 kg    Relevant CV Studies:  LHC 1/10/202 [facility]): Diagnostic Dominance: Right  Left Main  Mid LM to Dist LM lesion is 100% stenosed. Vessel is not the culprit lesion. The lesion is type C, located proximal to the major branch, focal and discrete. The lesion is mildly calcified. The lesion was not previously treated. The stenosis was measured by a visual reading. Pressure wire/FFR was not performed on the lesion. IVUS was not performed.  Left Anterior Descending  Ost LAD lesion is 100% stenosed. Vessel is not the culprit lesion. The lesion is type C, located at the major branch, discrete and chronically occluded. The lesion was not previously treated. The stenosis was measured by a visual reading. Pressure wire/FFR was not performed on the lesion. IVUS was not performed.  Mid LAD lesion is 100% stenosed. Vessel is not the culprit lesion. The lesion is not complex (non high-C), located at the major branch and discrete. The lesion was not previously treated. The stenosis was measured by a visual reading. Pressure wire/FFR was not performed on the lesion. IVUS was not performed.  Left Circumflex  Collaterals  Dist Cx filled by collaterals from Dist LAD.    Right Coronary Artery  Mid RCA lesion is 100% stenosed. Vessel is not the culprit lesion. The lesion is type C, located at the major branch, eccentric, irregular and chronically occluded with bridging and right-to-right collateral flow. The lesion was not previously  treated. The stenosis was measured by a visual reading. Pressure wire/FFR was not performed on the lesion. IVUS was not performed.  Right Posterior Descending Artery  Collaterals  RPDA filled by collaterals from Dist LAD.    LIMA Graft To Mid LAD  Graft To Mid Cx  Origin lesion is 100% stenosed. Vessel is the culprit lesion. The lesion is not complex (non high-C), located proximal to the major branch, focal and discrete. The lesion was not previously treated. The stenosis was measured by a visual reading. Pressure wire/FFR was not performed on the lesion. IVUS was not performed.   Intervention   Mid RCA lesion  Post-Intervention Lesion Assessment  The intervention was unsuccessful due to inability to cross the lesion with wire. Intentional subintimal strategy was not used. Embolic protection device was not deployed. The guidewire was not threaded through a graft to reach the lesion. Pre-interventional TIMI flow is 1. Post-intervention TIMI flow is 1. Treated lesion length: 20 mm. No complications occurred at this lesion. Pressure gradient/FFR was not measured. Judy KitchenUltrasound (IVUS) was not performed on the lesion.  There is a 100% residual stenosis post intervention.    Wall Motion  Resting       Severe global akinesis left ventricular enlargement ejection fraction less than 25%           Coronary Diagrams   Diagnostic Dominance: Right    Intervention     Conclusion   Mid LM to Dist LM lesion is 100% stenosed.  Mid RCA lesion is 100% stenosed.  Post intervention, there is a 100% residual stenosis.  Ost LAD lesion is 100% stenosed.  Mid LAD lesion is 100% stenosed.  Origin lesion is 100% stenosed.  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  Widely patent LIMA to mid LAD  Extensive collaterals from LAD system to circumflex as well as distal RCA  Severely depressed left ventricular function globally  left ventricular enlargement ejection fraction less than 25%  Unsuccessful attempt to intervene on the mid right coronary artery because of the CTO  [employer] was deployed  Clinical support was deferred since the patient did not appear to be Judy cardiogenic shock at the time _______________  2D echo 07/20/2020: Pending, with preliminary report of an EF 30-40% _______________  Carlton Adam MPI 03/2020: Pharmacological myocardial perfusion imaging study with no significant  ischemia Normal wall motion, EF estimated at 37%, possibly suppressed secondary to GI uptake artifact versus conduction abnormality No EKG changes concerning for ischemia at peak stress or Judy recovery. CT attenuation correction images with moderate three-vessel coronary calcification, moderate aortic atherosclerosis Resting EKG with right bundle branch block Low risk scan _______________  2D echo 03/2019: 1. The left ventricle has mildly reduced systolic function, with an  ejection fraction of 45-50%. The cavity size was normal. There is mildly  increased left ventricular wall thickness. Left ventricular diastolic  Doppler parameters are consistent with  impaired relaxation. Left ventricular diffuse hypokinesis.  2. The right ventricle has normal systolic function. The cavity was  normal. There is no increase Judy right ventricular wall thickness. Right  ventricular systolic pressure is upper normal to mildly elevated (25-30  mmHg plus central venous pressure).  3. Right atrial size was mildly dilated.  4. The mitral valve is degenerative. Mild thickening of the mitral valve  leaflet. There is mild mitral annular calcification present. No evidence  of mitral valve stenosis.  5. The tricuspid valve is not well visualized. Tricuspid valve  regurgitation is mild-moderate.  6. The aortic valve is tricuspid. Moderate thickening of the aortic  valve.  7. The aorta is normal unless otherwise noted. _______________  2D  echo 07/2018: - Left ventricle: The cavity size was normal. There was mild  concentric hypertrophy. Systolic function was normal. The  estimated ejection fraction was Judy the range of 50% to 55%.  Regional wall motion abnormalities cannot be excluded, Judy the  setting of bundle branch block. Doppler parameters are consistent  with abnormal left ventricular relaxation (grade 1 diastolic  dysfunction).  - Left atrium: The atrium was mildly dilated.  - Right ventricle: Systolic function was normal.  - Tricuspid valve: There was mild-moderate regurgitation.  - Pulmonary arteries: Systolic pressure was within the normal  range.   Laboratory Data:  Chemistry Recent Labs  Lab 07/19/2020 0317 07/29/20 0735  NA 139 135  K 3.4* 4.0  CL 99 98  CO2 24 19*  GLUCOSE 105* 152*  BUN 26* 40*  CREATININE 1.45* 1.66*  CALCIUM 8.9 8.6*  GFRNONAA 38* 33*  ANIONGAP 16* 18*    Recent Labs  Lab 08/11/2020 0317  PROT 6.2*  ALBUMIN 3.7  AST 54*  ALT 13  ALKPHOS 60  BILITOT 0.8   Hematology Recent Labs  Lab 08/13/2020 0317 07/29/20 0735  WBC 10.9* 11.2*  RBC 3.42* 3.14*  HGB 10.5* 9.8*  HCT 34.2* 30.8*  MCV 100.0 98.1  MCH 30.7 31.2  MCHC 30.7 31.8  RDW 13.9 14.0  PLT 181 174   Cardiac EnzymesNo results for input(s): TROPONINI Judy the last 168 hours. No results for input(s): TROPIPOC Judy the last 168 hours.  BNP Recent Labs  Lab 08/15/2020 0317 07/29/20 0735  BNP 3,543.3* >4,500.0*    DDimer No results for input(s):  DDIMER Judy the last 168 hours.  Radiology/Studies:  DG Chest 2 View  Result Date: 08/13/2020 IMPRESSION: 1. New airspace opacities Judy the right lower lobe concerning for pneumonia. 2. Probable small right-sided pleural effusion. 3. Mild interstitial edema. Electronically Signed   By: Constance Holster M.D.   On: 08/07/2020 03:27   DG Chest Port 1 View  Result Date: 07/29/2020 IMPRESSION: Worsening interstitial edema with growing bilateral pleural  effusions. Electronically Signed   By: Constance Holster M.D.   On: 07/29/2020 02:29    Assessment and Plan:   1.  Acute hypoxic respiratory failure: -Appears to be more so Judy setting of acute on chronic HFrEF with significant volume overload  -Patient initially required BiPAP this morning but was subsequently weaned to nonrebreather, with continued work of breathing she has agreed to be placed back on BiPAP.  If with IV Lasix and IV nitro, and reinitiation of BiPAP, respiratory status does not rapidly improve she will need to be intubated with likely transfer to [facility] for evaluation by the advanced heart failure service.  This was discussed with patient, husband, nurse, cardiology and PCCM MDs  2. CAD status post CABG with NSTEMI: -Chest pain-free -Emergent LHC on 1/10 as outlined above with coronary artery not amenable to percutaneous intervention -Continue heparin drip for total 48 to 72 hours -ASA -Will discuss possible addition of Plavix with MD -Not on beta-blocker as outlined below -PTA statin for now  3.  HFrEF: -Suspect her current presentation is most consistent with acute on chronic HFrEF with significant volume overload -Start IV Lasix 40 mg twice daily -Place on a nitro drip -Patient has been resumed on BiPAP as above -Defer initiating beta-blocker Judy the setting of decompensated heart failure and with noted complete heart block Judy the ED -She has documented intolerance to ACE inhibitor/ARB/and MRA, Judy an effort to do optimize GDMT moving forward she may need to be rechallenged accordingly -Daily weights -Strict I's and O's  4. AKI: -Monitor with diuresis  5. Anemia: -Baseline hemoglobin around 12 with current value 9.8 -No obvious bleeding -Monitor  6. COPD: -Does not appear to be Judy acute exacerbation  7. HLD: -Check lipid panel -Goal LDL less than 70 -Escalate to high intensity statin as indicated based on lipid panel    For questions or updates,  please contact [place] Please consult [email or web address] for contact info under Cardiology/STEMI.   Signed, Christell Faith, PA-C [place] Pager: [phone] 07/29/2020, 9:19 AM

## 2020-07-30 ENCOUNTER — Inpatient Hospital Stay: Payer: Medicare HMO

## 2020-07-30 DIAGNOSIS — I5033 Acute on chronic diastolic (congestive) heart failure: Secondary | ICD-10-CM | POA: Diagnosis not present

## 2020-07-30 DIAGNOSIS — I509 Heart failure, unspecified: Secondary | ICD-10-CM

## 2020-07-30 DIAGNOSIS — C8330 Diffuse large B-cell lymphoma, unspecified site: Secondary | ICD-10-CM | POA: Diagnosis not present

## 2020-07-30 DIAGNOSIS — Z951 Presence of aortocoronary bypass graft: Secondary | ICD-10-CM

## 2020-07-30 DIAGNOSIS — I5031 Acute diastolic (congestive) heart failure: Secondary | ICD-10-CM | POA: Diagnosis not present

## 2020-07-30 DIAGNOSIS — J9601 Acute respiratory failure with hypoxia: Secondary | ICD-10-CM | POA: Diagnosis not present

## 2020-07-30 DIAGNOSIS — I340 Nonrheumatic mitral (valve) insufficiency: Secondary | ICD-10-CM

## 2020-07-30 DIAGNOSIS — N179 Acute kidney failure, unspecified: Secondary | ICD-10-CM | POA: Diagnosis not present

## 2020-07-30 LAB — CBC
HCT: 29.4 % — ABNORMAL LOW (ref 36.0–46.0)
Hemoglobin: 9.3 g/dL — ABNORMAL LOW (ref 12.0–15.0)
MCH: 30.7 pg (ref 26.0–34.0)
MCHC: 31.6 g/dL (ref 30.0–36.0)
MCV: 97 fL (ref 80.0–100.0)
Platelets: 185 10*3/uL (ref 150–400)
RBC: 3.03 MIL/uL — ABNORMAL LOW (ref 3.87–5.11)
RDW: 14 % (ref 11.5–15.5)
WBC: 11.1 10*3/uL — ABNORMAL HIGH (ref 4.0–10.5)
nRBC: 0 % (ref 0.0–0.2)

## 2020-07-30 LAB — BASIC METABOLIC PANEL
Anion gap: 13 (ref 5–15)
BUN: 44 mg/dL — ABNORMAL HIGH (ref 8–23)
CO2: 24 mmol/L (ref 22–32)
Calcium: 7.9 mg/dL — ABNORMAL LOW (ref 8.9–10.3)
Chloride: 100 mmol/L (ref 98–111)
Creatinine, Ser: 1.63 mg/dL — ABNORMAL HIGH (ref 0.44–1.00)
GFR, Estimated: 33 mL/min — ABNORMAL LOW (ref >60)
Glucose, Bld: 110 mg/dL — ABNORMAL HIGH (ref 70–99)
Potassium: 3.7 mmol/L (ref 3.5–5.1)
Sodium: 137 mmol/L (ref 135–145)

## 2020-07-30 LAB — PHOSPHORUS: Phosphorus: 4.6 mg/dL (ref 2.5–4.6)

## 2020-07-30 LAB — HEPARIN LEVEL (UNFRACTIONATED): Heparin Unfractionated: 0.29 IU/mL — ABNORMAL LOW (ref 0.30–0.70)

## 2020-07-30 LAB — MAGNESIUM: Magnesium: 2.5 mg/dL — ABNORMAL HIGH (ref 1.7–2.4)

## 2020-07-30 MED ORDER — ROCURONIUM BROMIDE 50 MG/5ML IV SOLN
INTRAVENOUS | Status: AC
  Filled 2020-07-30: qty 1

## 2020-07-30 MED ORDER — MIDAZOLAM HCL 2 MG/2ML IJ SOLN
INTRAMUSCULAR | Status: AC
  Filled 2020-07-30: qty 2

## 2020-07-30 MED ORDER — FENTANYL 2500MCG IN NS 250ML (10MCG/ML) PREMIX INFUSION
0.0000 ug/h | INTRAVENOUS | Status: DC
  Filled 2020-07-30: qty 250

## 2020-07-30 MED ORDER — METHYLPREDNISOLONE SODIUM SUCC 40 MG IJ SOLR: 20.0000 mg | Freq: Two times a day (BID) | INTRAMUSCULAR | Status: DC

## 2020-07-30 MED ORDER — BUDESONIDE 0.5 MG/2ML IN SUSP
0.5000 mg | Freq: Two times a day (BID) | RESPIRATORY_TRACT | Status: DC
  Administered 2020-07-30: 0.5 mg via RESPIRATORY_TRACT
  Filled 2020-07-30: qty 2

## 2020-07-30 MED ORDER — IPRATROPIUM-ALBUTEROL 0.5-2.5 (3) MG/3ML IN SOLN
3.0000 mL | RESPIRATORY_TRACT | Status: DC
  Administered 2020-07-30: 3 mL via RESPIRATORY_TRACT
  Filled 2020-07-30: qty 3

## 2020-07-30 MED ORDER — METOPROLOL TARTRATE 5 MG/5ML IV SOLN
5.0000 mg | Freq: Three times a day (TID) | INTRAVENOUS | Status: DC | PRN
  Administered 2020-07-30: 5 mg via INTRAVENOUS

## 2020-07-30 MED ORDER — FENTANYL CITRATE (PF) 100 MCG/2ML IJ SOLN
100.0000 ug | Freq: Once | INTRAMUSCULAR | Status: DC
Start: 2020-07-30 — End: 2020-07-30

## 2020-07-30 MED ORDER — ETOMIDATE 2 MG/ML IV SOLN: 20.0000 mg | Freq: Once | INTRAVENOUS | Status: DC

## 2020-07-30 MED ORDER — NOREPINEPHRINE 4 MG/250ML-% IV SOLN
INTRAVENOUS | Status: AC
  Filled 2020-07-30: qty 250

## 2020-07-30 MED ORDER — PANTOPRAZOLE SODIUM 40 MG IV SOLR
40.0000 mg | Freq: Two times a day (BID) | INTRAVENOUS | Status: DC
  Administered 2020-07-30: 40 mg via INTRAVENOUS
  Filled 2020-07-30: qty 40

## 2020-07-30 MED ORDER — EPINEPHRINE HCL 5 MG/250ML IV SOLN IN NS
0.5000 ug/min | INTRAVENOUS | Status: DC
  Filled 2020-07-30: qty 250

## 2020-07-30 MED ORDER — FENTANYL CITRATE (PF) 100 MCG/2ML IJ SOLN
INTRAMUSCULAR | Status: AC
  Filled 2020-07-30: qty 2

## 2020-07-30 MED ORDER — LORAZEPAM 2 MG/ML IJ SOLN
0.5000 mg | Freq: Once | INTRAMUSCULAR | Status: AC
  Administered 2020-07-30: 0.5 mg via INTRAVENOUS
  Filled 2020-07-30: qty 1

## 2020-07-30 MED ORDER — METOPROLOL TARTRATE 5 MG/5ML IV SOLN
INTRAVENOUS | Status: AC
  Filled 2020-07-30: qty 5

## 2020-07-30 MED ORDER — ROCURONIUM BROMIDE 50 MG/5ML IV SOLN: 50.0000 mg | Freq: Once | INTRAVENOUS | Status: DC

## 2020-07-30 MED ORDER — ETOMIDATE 2 MG/ML IV SOLN
INTRAVENOUS | Status: AC
  Filled 2020-07-30: qty 10

## 2020-07-30 MED ORDER — HEPARIN (PORCINE) 25000 UT/250ML-% IV SOLN: 750.0000 [IU]/h | INTRAVENOUS | Status: DC

## 2020-08-02 LAB — CULTURE, BLOOD (ROUTINE X 2)
Culture: NO GROWTH
Culture: NO GROWTH
Special Requests: ADEQUATE
Special Requests: ADEQUATE

## 2020-08-15 ENCOUNTER — Ambulatory Visit: Payer: Medicare HMO | Admitting: Family

## 2020-08-19 NOTE — Progress Notes (Signed)
S/P cardiac arrest 3x. ACLS protocol initiated. See Code sheet.   Husband updated by Dr. Mortimer Fries at bedside. Chaplain spoke with the husband and he called daughter ans son to come.

## 2020-08-19 NOTE — Death Summary Note (Signed)
DEATH SUMMARY   Patient Details  Name: Judy Torres MRN: [id number] DOB: May 03, 1946  Admission/Discharge Information   Admit Date:  2020-08-22  Date of Death:   2020/08/24   Time of Death:  1403-11-03  Length of Stay: 2  Referring Physician: [name], MD   Reason(s) for Hospitalization  ISCHEMIC CARDIOMYOPATHY  Diagnoses  Preliminary cause of death: ISCHEMIC CARDIOMYOPATHY Secondary Diagnoses (including complications and co-morbidities):  Active Problems:   Systemic lupus (HCC)   Hypertension   Diffuse large B cell lymphoma (HCC)   Atherosclerotic heart disease of native coronary artery without angina pectoris   Diastolic heart failure (HCC)   Hx of CABG   RLL pneumonia   Acute respiratory failure with hypoxia (HCC)   AKI (acute kidney injury) (HCC)   History of toe surgery 07/25/20   Acute on chronic diastolic CHF (congestive heart failure) (HCC)   NSTEMI (non-ST elevated myocardial infarction) (HCC)   Non-STEMI (non-ST elevated myocardial infarction) (HCC)   Acute on chronic congestive heart failure (HCC)   Mitral valve insufficiency   75 yo female admitted with acute hypoxic respiratory failure secondary to acute on chronic HFrEF and NSTEMI s/p cardiac cath which revealed severe multivessel CAD with unsuccessful intervention attempt of the native mid RCA lesion requiring continuous Bipap, nitroglycerin gtt, and aggressive diuresis   History of Present Illness:  This is a 75 yo female who presented to [facility] ER via EMS on 22-Aug-2022 with acute onset of shortness of breath.  EMS reported pt hypoxic upon their arrival O2 sats 83% on RA.  She was placed on O2 @3L , and EMS administered albuterol treatment with improvement of oxygenation.  Upon arrival to the ER pt afebrile, tachycardic hr 102, mildly tachypneic, and CXR concerning for pneumonia along with interstitial edema pt received levaquin x1 dose.  Lab results significant for troponin 9,838 and BNP 3,543.3, therefore heparin  gtt initiated and Cardiology consulted.  Pt subsequently admitted to the progressive care unit per hospitalist team for additional workup and treatment but remained Judy the ER pending bed availability.  On the morning of Aug 22, 2022 while Judy the ER pt developed worsening acute hypoxic respiratory with tachypnea; was found slumped down Judy the bed; tachycardic; and diaphoretic.  ER physician arrived at bedside recommended placing pt on continuous Bipap. However, pt bradycardic EKG revealed complete heart block, hr 40.  Pt received atropine x2 along with 500 ml fluid bolus with temporary improvement Judy heart rate.  Cardiologist Dr. Rockey Situ consulted and concerned for NSTEMI. Dr. Rockey Situ recommended contacting on-call STEMI provider Dr. Clayborn Bigness for possible need for cardiac cath.  Pt later developed ventricular tachycardia requiring defibrillation x1, post cardiac rhythm sinus atrial tachycardia hr 119 with right bundle branch block.  Pt transported emergently to cardiac cath lab 08-22-22.  Cardiac cath revealed severe multivessel CAD (see detailed report below). Intervention was attempted on the native mid RCA lesion but was unsuccessful due to inability to cross the lesion with wire. LV gram with an EF of less than 25%.  She was subsequently transferred to ICU post procedure on NRB.  On 01/11 pt c/o worsening hypoxia requiring continuous Bipap.  [facility] Cardiology consulted recommended lasix and nitroglycerin gtts. Per cardiology if pts respiratory status did not improve pt would require mechanical intubation and transfer to [facility] for heart failure management.    Past Medical History:  Raynaud's Disease Polyp of Ascending Colon Lupus PVD Lupus Nephritis, ISN/RPS class IV HLD HTN GERD DDD Stage IV CKD Arthritis Stomach  Cancer  Anemia secondary to vitamin B12 deficiency  Significant Hospital Events:  01/10: Pt presented to [facility] ER with acute hypoxic respiratory failure requiring admission to the PCU.   However, remained Judy the ER pending bed availability 01/10: Code STEMI initiated, pt underwent emergent cardiac cath and required transfer to ICU post procedure 01/11: Pt developed worsening acute hypoxic respiratory failure requiring Bipap.  [place] [facility] P[facility] team consulted to assist with management.  Per cardiology pt placed on nitroglycerin gtt and 40 mg iv lasix bid  Consults:  Intensivist  Cardiology   Procedures:  Cardiac Catheterization 01/10  Significant Diagnostic Tests:  Cardiac catheterization 01/10>>Mid LM to Dist LM lesion is 100% stenosed. Mid RCA lesion is 100% stenosed. Post intervention, there is a 100% residual stenosis. Ost LAD lesion is 100% stenosed. Mid LAD lesion is 100% stenosed. Origin lesion is 100% stenosed. 100% occluded circumflex at the ostium 100% occluded vein graft to circumflex system probable IRA Widely patent LIMA to mid LA Extensive collaterals from LAD system to circumflex as well as distal RCA Severely depressed left ventricular function globally left ventricular enlargement ejection fraction less than 25% Unsuccessful attempt to intervene on the mid right coronary artery because of the CTO Mynx was deployed Clinical support was deferred since the patient did not appear to be Judy cardiogenic shock at the time    Echo 01/10>>EF 30 to 35%    severe Cardiac failure CODE BLUE called 3 times ACLS protocol initiated  Husband Called to Bedside  GOALS OF CARE DISCUSSION  The Clinical status was relayed to Husband detail.  Updated and notified of patients medical condition.   Patient with Progressive multiorgan failure with very low chance of meaningful recovery despite all aggressive and optimal medical therapy. Patient is Judy the Dying  Process associated with Suffering.  Family understands the situation.  Husband consented and agreed to DNR.  Patient passed away August 09, 2020 @1405      Pertinent Labs and Studies  Significant  Diagnostic Studies DG Chest 2 View  Result Date: 07/24/2020 CLINICAL DATA:  Shortness of breath EXAM: CHEST - 2 VIEW COMPARISON:  January 08, 2019 FINDINGS: The heart size is stable. The previously demonstrated right-sided Port-A-Cath has been removed. There are fibrotic changes centrally likely related to post treatment changes. There are new airspace opacities Judy the right lower lobe with a probable small right-sided pleural effusion. There is fullness of the right hilum. There are Kerley B lines bilaterally. There is no pneumothorax. There is a compression fracture of the T12 vertebral body, chronic appearing. IMPRESSION: 1. New airspace opacities Judy the right lower lobe concerning for pneumonia. 2. Probable small right-sided pleural effusion. 3. Mild interstitial edema. Electronically Signed   By: Constance Holster M.D.   On: 08/01/2020 03:27   CARDIAC CATHETERIZATION  Result Date: 07/29/2020  Mid LM to Dist LM lesion is 100% stenosed.  Mid RCA lesion is 100% stenosed.  Post intervention, there is a 100% residual stenosis.  Ost LAD lesion is 100% stenosed.  Mid LAD lesion is 100% stenosed.  Origin lesion is 100% stenosed.  100% occluded circumflex at the ostium  100% occluded vein graft to circumflex system probable IRA  Widely patent LIMA to mid LAD  Extensive collaterals from LAD system to circumflex as well as distal RCA  Severely depressed left ventricular function globally left ventricular enlargement ejection fraction less than 25%  Unsuccessful attempt to intervene on the mid right coronary artery because of the CTO  Mynx was deployed  Clinical support was deferred since the patient did not appear to be Judy cardiogenic shock at the time    [facility] Chest [facility] 1 View  Result Date: 08-12-20 CLINICAL DATA:  Acute respiratory failure with hypoxia. EXAM: PORTABLE CHEST 1 VIEW COMPARISON:  07/29/2020 FINDINGS: External pacer paddles are stable. The cardiac silhouette, mediastinal and hilar  contours are unchanged. Persistent asymmetric interstitial and airspace process Judy the lungs along with a possible small right pleural effusion. No pneumothorax. IMPRESSION: Persistent asymmetric interstitial and airspace process. Electronically Signed   By: Marijo Sanes M.D.   On: 08/12/2020 09:09   DG Chest Port 1 View  Result Date: 07/29/2020 CLINICAL DATA:  Congestive heart failure EXAM: PORTABLE CHEST 1 VIEW COMPARISON:  July 28, 2020 FINDINGS: There is cardiomegaly. The patient is status post prior median sternotomy. There are growing bilateral pleural effusions. There are worsening interstitial lung markings. There is fluid within the pleural fissures. There is no pneumothorax. No acute osseous abnormality. IMPRESSION: Worsening interstitial edema with growing bilateral pleural effusions. Electronically Signed   By: Constance Holster M.D.   On: 07/29/2020 02:29   DG MINI C-ARM IMAGE ONLY  Result Date: 07/25/2020 There is no interpretation for this exam.  This order is for images obtained during a surgical procedure.  Please See "Surgeries" Tab for more information regarding the procedure.   ECHOCARDIOGRAM COMPLETE  Result Date: 07/29/2020    ECHOCARDIOGRAM REPORT   Patient Name:   [place] Date of Exam: 08/09/2020 Medical Rec #:  [id number]       Height:       59.0 Judy Accession #:    [id number]      Weight:       117.0 lb Date of Birth:  February 02, 1946       BSA:          1.469 m Patient Age:    89 years        BP:           102/74 mmHg Patient Gender: F               HR:           98 bpm. Exam Location:  [facility] Procedure: 2D Echo, Cardiac Doppler and Color Doppler Indications:     NSTEMI I21.4  History:         Patient has prior history of Echocardiogram examinations, most                  recent 04/03/2019. COPD; Risk Factors:Hypertension.                  Cardiomyopathy, PVD.  Sonographer:     Sherrie Sport RDCS (AE) Referring Phys:  [id number] Athena Masse Diagnosing Phys: Nelva Bush MD  IMPRESSIONS  1. Left ventricular ejection fraction, by estimation, is 30 to 35%. The left ventricle has moderately decreased function. The left ventricle demonstrates regional wall motion abnormalities (see scoring diagram/findings for description). There is mild left ventricular hypertrophy. Left ventricular diastolic parameters are consistent with Grade II diastolic dysfunction (pseudonormalization). Elevated left atrial pressure. There is severe hypokinesis of the left ventricular, entire inferolateral wall. There is akinesis of the left ventricular, basal inferior wall.  2. Right ventricular systolic function is moderately reduced. The right ventricular size is moderately enlarged. There is moderately elevated pulmonary artery systolic pressure.  3. Left atrial size was mildly dilated.  4. The mitral valve is degenerative. Severe mitral valve regurgitation. No evidence of  mitral stenosis.  5. Tricuspid valve regurgitation is moderate.  6. The aortic valve is tricuspid. There is mild calcification of the aortic valve. There is moderate thickening of the aortic valve. Aortic valve regurgitation is not visualized. Mild to moderate aortic valve sclerosis/calcification is present, without any evidence of aortic stenosis.  7. The inferior vena cava is dilated Judy size with <50% respiratory variability, suggesting right atrial pressure of 15 mmHg. FINDINGS  Left Ventricle: Left ventricular ejection fraction, by estimation, is 30 to 35%. The left ventricle has moderately decreased function. The left ventricle demonstrates regional wall motion abnormalities. Severe hypokinesis of the left ventricular, entire  inferolateral wall. The left ventricular internal cavity size was normal Judy size. There is mild left ventricular hypertrophy. Left ventricular diastolic parameters are consistent with Grade II diastolic dysfunction (pseudonormalization). Elevated left atrial pressure. Right Ventricle: The right ventricular size is  moderately enlarged. No increase Judy right ventricular wall thickness. Right ventricular systolic function is moderately reduced. There is moderately elevated pulmonary artery systolic pressure. The tricuspid  regurgitant velocity is 3.24 m/s, and with an assumed right atrial pressure of 15 mmHg, the estimated right ventricular systolic pressure is 29.5 mmHg. Left Atrium: Left atrial size was mildly dilated. Right Atrium: Right atrial size was normal Judy size. Pericardium: There is no evidence of pericardial effusion. Mitral Valve: The mitral valve is degenerative Judy appearance. There is moderate thickening of the mitral valve leaflet(s). There is mild calcification of the mitral valve leaflet(s). Mild mitral annular calcification. Severe mitral valve regurgitation. No evidence of mitral valve stenosis. Tricuspid Valve: The tricuspid valve is normal Judy structure. Tricuspid valve regurgitation is moderate. Aortic Valve: The aortic valve is tricuspid. There is mild calcification of the aortic valve. There is moderate thickening of the aortic valve. Aortic valve regurgitation is not visualized. Mild to moderate aortic valve sclerosis/calcification is present, without any evidence of aortic stenosis. Aortic valve mean gradient measures 3.5 mmHg. Aortic valve peak gradient measures 5.2 mmHg. Aortic valve area, by VTI measures 2.39 cm. Pulmonic Valve: The pulmonic valve was not well visualized. Pulmonic valve regurgitation is mild. No evidence of pulmonic stenosis. Aorta: The aortic root and ascending aorta are structurally normal, with no evidence of dilitation. Pulmonary Artery: The pulmonary artery is not well seen. Venous: The inferior vena cava is dilated Judy size with less than 50% respiratory variability, suggesting right atrial pressure of 15 mmHg. IAS/Shunts: The atrial septum is grossly normal.  LEFT VENTRICLE PLAX 2D LVIDd:         4.88 cm      Diastology LVIDs:         4.33 cm      LV e' medial:    3.70 cm/s LV  PW:         1.13 cm      LV E/e' medial:  34.9 LV IVS:        1.03 cm      LV e' lateral:   3.92 cm/s LVOT diam:     2.00 cm      LV E/e' lateral: 32.9 LV SV:         39 LV SV Index:   27 LVOT Area:     3.14 cm  LV Volumes (MOD) LV vol d, MOD A2C: 100.0 ml LV vol d, MOD A4C: 94.7 ml LV vol s, MOD A2C: 76.3 ml LV vol s, MOD A4C: 53.9 ml LV SV MOD A2C:     23.7 ml LV SV MOD A4C:  94.7 ml LV SV MOD BP:      34.1 ml RIGHT VENTRICLE RV Basal diam:  5.23 cm RV S prime:     10.70 cm/s TAPSE (M-mode): 1.2 cm LEFT ATRIUM             Index       RIGHT ATRIUM           Index LA diam:        4.50 cm 3.06 cm/m  RA Area:     15.40 cm LA Vol (A2C):   70.6 ml 48.07 ml/m RA Volume:   33.50 ml  22.81 ml/m LA Vol (A4C):   42.5 ml 28.94 ml/m LA Biplane Vol: 55.5 ml 37.79 ml/m  AORTIC VALVE                   PULMONIC VALVE AV Area (Vmax):    1.65 cm    PV Vmax:        0.66 m/s AV Area (Vmean):   1.46 cm    PV Peak grad:   1.8 mmHg AV Area (VTI):     2.39 cm    RVOT Peak grad: 3 mmHg AV Vmax:           114.50 cm/s AV Vmean:          84.400 cm/s AV VTI:            0.163 m AV Peak Grad:      5.2 mmHg AV Mean Grad:      3.5 mmHg LVOT Vmax:         60.10 cm/s LVOT Vmean:        39.200 cm/s LVOT VTI:          0.124 m LVOT/AV VTI ratio: 0.76  AORTA Ao Root diam: 2.95 cm MITRAL VALVE                TRICUSPID VALVE MV Area (PHT): 7.29 cm     TR Peak grad:   42.0 mmHg MV Decel Time: 104 msec     TR Vmax:        324.00 cm/s MV E velocity: 129.00 cm/s MV A velocity: 84.00 cm/s   SHUNTS MV E/A ratio:  1.54         Systemic VTI:  0.12 m                             Systemic Diam: 2.00 cm Nelva Bush MD Electronically signed by Nelva Bush MD Signature Date/Time: 07/29/2020/10:41:52 AM    Final     Microbiology Recent Results (from the past 240 hour(s))  SARS CORONAVIRUS 2 (TAT 6-24 HRS) Nasopharyngeal Nasopharyngeal Swab     Status: None   Collection Time: 07/23/20 10:17 AM   Specimen: Nasopharyngeal Swab  Result Value Ref  Range Status   SARS Coronavirus 2 NEGATIVE NEGATIVE Final    Comment: (NOTE) SARS-CoV-2 target nucleic acids are NOT DETECTED.  The SARS-CoV-2 RNA is generally detectable Judy upper and lower respiratory specimens during the acute phase of infection. Negative results do not preclude SARS-CoV-2 infection, do not rule out co-infections with other pathogens, and should not be used as the sole basis for treatment or other patient management decisions. Negative results must be combined with clinical observations, patient history, and epidemiological information. The expected result is Negative.  Fact Sheet for Patients: SugarRoll.be  Fact Sheet for Healthcare Providers: https://www.woods-mathews.com/  This test is not  yet approved or cleared by the [place] and  has been authorized for detection and/or diagnosis of SARS-CoV-2 by FDA under an Emergency Use Authorization (EUA). This EUA will remain  Judy effect (meaning this test can be used) for the duration of the COVID-19 declaration under Se ction 564(b)(1) of the Act, 21 U.S.C. section 360bbb-3(b)(1), unless the authorization is terminated or revoked sooner.  Performed at [facility], [place] [address], [place], [place] [address]   Culture, blood (routine x 2)     Status: None (Preliminary result)   Collection Time: 08/08/2020  3:19 AM   Specimen: BLOOD  Result Value Ref Range Status   Specimen Description BLOOD LEFT ANTECUBITAL  Final   Special Requests   Final    BOTTLES DRAWN AEROBIC AND ANAEROBIC Blood Culture adequate volume   Culture   Final    NO GROWTH 2 DAYS Performed at [facility], [address], [place], [place] [address]    Report Status PENDING  Incomplete  Culture, blood (routine x 2)     Status: None (Preliminary result)   Collection Time: 08/11/2020  3:19 AM   Specimen: BLOOD  Result Value Ref Range Status   Specimen Description BLOOD BLOOD RIGHT  ARM  Final   Special Requests   Final    BOTTLES DRAWN AEROBIC AND ANAEROBIC Blood Culture adequate volume   Culture   Final    NO GROWTH 2 DAYS Performed at [facility], [address], [place], [place] [address]    Report Status PENDING  Incomplete  Resp Panel by RT-PCR (Flu A&B, Covid) Nasopharyngeal Swab     Status: None   Collection Time: 08/16/2020  4:42 AM   Specimen: Nasopharyngeal Swab; Nasopharyngeal(NP) swabs Judy vial transport medium  Result Value Ref Range Status   SARS Coronavirus 2 by RT PCR NEGATIVE NEGATIVE Final    Comment: (NOTE) SARS-CoV-2 target nucleic acids are NOT DETECTED.  The SARS-CoV-2 RNA is generally detectable Judy upper respiratory specimens during the acute phase of infection. The lowest concentration of SARS-CoV-2 viral copies this assay can detect is 138 copies/mL. A negative result does not preclude SARS-Cov-2 infection and should not be used as the sole basis for treatment or other patient management decisions. A negative result may occur with  improper specimen collection/handling, submission of specimen other than nasopharyngeal swab, presence of viral mutation(s) within the areas targeted by this assay, and inadequate number of viral copies(<138 copies/mL). A negative result must be combined with clinical observations, patient history, and epidemiological information. The expected result is Negative.  Fact Sheet for Patients:  EntrepreneurPulse.com.au  Fact Sheet for Healthcare Providers:  IncredibleEmployment.be  This test is no t yet approved or cleared by the [place] FDA and  has been authorized for detection and/or diagnosis of SARS-CoV-2 by FDA under an Emergency Use Authorization (EUA). This EUA will remain  Judy effect (meaning this test can be used) for the duration of the COVID-19 declaration under Section 564(b)(1) of the Act, 21 U.S.C.section 360bbb-3(b)(1), unless the authorization  is terminated  or revoked sooner.       Influenza A by PCR NEGATIVE NEGATIVE Final   Influenza B by PCR NEGATIVE NEGATIVE Final    Comment: (NOTE) The Xpert Xpress SARS-CoV-2/FLU/RSV plus assay is intended as an aid Judy the diagnosis of influenza from Nasopharyngeal swab specimens and should not be used as a sole basis for treatment. Nasal washings and aspirates are unacceptable for Xpert Xpress SARS-CoV-2/FLU/RSV testing.  Fact Sheet for Patients: EntrepreneurPulse.com.au  Fact Sheet for Healthcare Providers: IncredibleEmployment.be  This test is not yet approved or cleared by the [place] FDA and has been authorized for detection and/or diagnosis of SARS-CoV-2 by FDA under an Emergency Use Authorization (EUA). This EUA will remain Judy effect (meaning this test can be used) for the duration of the COVID-19 declaration under Section 564(b)(1) of the Act, 21 U.S.C. section 360bbb-3(b)(1), unless the authorization is terminated or revoked.  Performed at [facility], [address] [place], [place] [address]   MRSA PCR Screening     Status: None   Collection Time: 07/20/2020 11:16 AM   Specimen: Nasal Mucosa; Nasopharyngeal  Result Value Ref Range Status   MRSA by PCR NEGATIVE NEGATIVE Final    Comment:        The GeneXpert MRSA Assay (FDA approved for NASAL specimens only), is one component of a comprehensive MRSA colonization surveillance program. It is not intended to diagnose MRSA infection nor to guide or monitor treatment for MRSA infections. Performed at [facility], [address] [place], [place] [address]     Lab Basic Metabolic Panel: Recent Labs  Lab 07/21/2020 [phone] 07/29/20 0735 08-11-2020 0218  NA 139 135 137  K 3.4* 4.0 3.7  CL 99 98 100  CO2 24 19* 24  GLUCOSE 105* 152* 110*  BUN 26* 40* 44*  CREATININE 1.45* 1.66* 1.63*  CALCIUM 8.9 8.6* 7.9*  MG  --   --  2.5*  PHOS  --   --  4.6    Liver Function Tests: Recent Labs  Lab 08/08/2020 0317  AST 54*  ALT 13  ALKPHOS 60  BILITOT 0.8  PROT 6.2*  ALBUMIN 3.7   No results for input(s): LIPASE, AMYLASE Judy the last 168 hours. No results for input(s): AMMONIA Judy the last 168 hours. CBC: Recent Labs  Lab 07/27/2020 0317 07/29/20 0735 August 11, 2020 0218  WBC 10.9* 11.2* 11.1*  NEUTROABS 8.6*  --   --   HGB 10.5* 9.8* 9.3*  HCT 34.2* 30.8* 29.4*  MCV 100.0 98.1 97.0  PLT 181 174 185   Cardiac Enzymes: No results for input(s): CKTOTAL, CKMB, CKMBINDEX, TROPONINI Judy the last 168 hours. Sepsis Labs: Recent Labs  Lab 07/26/2020 0317 07/21/2020 0500 07/29/20 0735 07/29/20 0856 August 11, 2020 0218  PROCALCITON <0.10  --   --   --   --   WBC 10.9*  --  11.2*  --  11.1*  LATICACIDVEN 2.0* 2.5*  --  1.8  --      Judy Torres 08-11-20, 2:12 PM

## 2020-08-19 NOTE — Progress Notes (Signed)
Pt transitioned to [place] 45L 34%. Pt tol well. Will continue to monitor closely

## 2020-08-19 NOTE — Progress Notes (Signed)
CRITICAL CARE NOTE  75 yo female with acute severe Cardiac failure CODE BLUE called 3 times ACLS protocol initiated  CC  follow up respiratory failure  HPI Patient remains critically ill Prognosis is guarded Patient is dying process    BP 114/75   Pulse 93   Temp 97.6 F (36.4 C) (Oral)   Resp (!) 28   Ht 4\' 11"  (1.499 m)   Wt 55.2 kg   SpO2 99%   BMI 24.58 kg/m    I/O last 3 completed shifts: In: 740.3 [P.O.:480; I.V.:260.3] Out: 1950 [Urine:1950] No intake/output data recorded.  SpO2: 99 % O2 Flow Rate (L/min): 45 L/min FiO2 (%): 28 %  Estimated body mass index is 24.58 kg/m as calculated from the following:   Height as of this encounter: 4\' 11"  (1.499 m).   Weight as of this encounter: 55.2 kg.  SIGNIFICANT EVENTS   REVIEW OF SYSTEMS  PATIENT IS UNABLE TO PROVIDE COMPLETE REVIEW OF SYSTEMS DUE TO SEVERE CRITICAL ILLNESS        PHYSICAL EXAMINATION:  GENERAL:critically ill appearing, +resp distress HEAD: Normocephalic, atraumatic.  EYES: Pupils equal, round, reactive to light.  No scleral icterus.  MOUTH: Moist mucosal membrane. NECK: Supple.  PULMONARY: +rhonchi, +wheezing CARDIOVASCULAR: S1 and S2. Regular rate and rhythm. No murmurs, rubs, or gallops.  GASTROINTESTINAL: Soft, nontender, -distended.  Positive bowel sounds.   MUSCULOSKELETAL: No swelling, clubbing, or edema.  NEUROLOGIC: obtunded, GCS<3 SKIN:intact,warm,dry  MEDICATIONS: I have reviewed all medications and confirmed regimen as documented   CULTURE RESULTS   Recent Results (from the past 240 hour(s))  SARS CORONAVIRUS 2 (TAT 6-24 HRS) Nasopharyngeal Nasopharyngeal Swab     Status: None   Collection Time: 07/23/20 10:17 AM   Specimen: Nasopharyngeal Swab  Result Value Ref Range Status   SARS Coronavirus 2 NEGATIVE NEGATIVE Final    Comment: (NOTE) SARS-CoV-2 target nucleic acids are NOT DETECTED.  The SARS-CoV-2 RNA is generally detectable in upper and  lower respiratory specimens during the acute phase of infection. Negative results do not preclude SARS-CoV-2 infection, do not rule out co-infections with other pathogens, and should not be used as the sole basis for treatment or other patient management decisions. Negative results must be combined with clinical observations, patient history, and epidemiological information. The expected result is Negative.  Fact Sheet for Patients: SugarRoll.be  Fact Sheet for Healthcare Providers: https://www.woods-mathews.com/  This test is not yet approved or cleared by the [place] FDA and  has been authorized for detection and/or diagnosis of SARS-CoV-2 by FDA under an Emergency Use Authorization (EUA). This EUA will remain  in effect (meaning this test can be used) for the duration of the COVID-19 declaration under Se ction 564(b)(1) of the Act, 21 U.S.C. section 360bbb-3(b)(1), unless the authorization is terminated or revoked sooner.  Performed at [facility], [place] [address] [place], [place] [address]   Culture, blood (routine x 2)     Status: None (Preliminary result)   Collection Time: 07/29/2020  3:19 AM   Specimen: BLOOD  Result Value Ref Range Status   Specimen Description BLOOD LEFT ANTECUBITAL  Final   Special Requests   Final    BOTTLES DRAWN AEROBIC AND ANAEROBIC Blood Culture adequate volume   Culture   Final    NO GROWTH 2 DAYS Performed at [facility], [address] [place], [place] [address]    Report Status PENDING  Incomplete  Culture, blood (routine x 2)     Status:  None (Preliminary result)   Collection Time: 07/25/2020  3:19 AM   Specimen: BLOOD  Result Value Ref Range Status   Specimen Description BLOOD BLOOD RIGHT ARM  Final   Special Requests   Final    BOTTLES DRAWN AEROBIC AND ANAEROBIC Blood Culture adequate volume   Culture   Final    NO GROWTH 2 DAYS Performed at [facility], [address] [place], [place] [address]    Report Status PENDING  Incomplete  Resp Panel by RT-PCR (Flu A&B, Covid) Nasopharyngeal Swab     Status: None   Collection Time: 07/19/2020  4:42 AM   Specimen: Nasopharyngeal Swab; Nasopharyngeal(NP) swabs in vial transport medium  Result Value Ref Range Status   SARS Coronavirus 2 by RT PCR NEGATIVE NEGATIVE Final    Comment: (NOTE) SARS-CoV-2 target nucleic acids are NOT DETECTED.  The SARS-CoV-2 RNA is generally detectable in upper respiratory specimens during the acute phase of infection. The lowest concentration of SARS-CoV-2 viral copies this assay can detect is 138 copies/mL. A negative result does not preclude SARS-Cov-2 infection and should not be used as the sole basis for treatment or other patient management decisions. A negative result may occur with  improper specimen collection/handling, submission of specimen other than nasopharyngeal swab, presence of viral mutation(s) within the areas targeted by this assay, and inadequate number of viral copies(<138 copies/mL). A negative result must be combined with clinical observations, patient history, and epidemiological information. The expected result is Negative.  Fact Sheet for Patients:  EntrepreneurPulse.com.au  Fact Sheet for Healthcare Providers:  IncredibleEmployment.be  This test is no t yet approved or cleared by the [place] FDA and  has been authorized for detection and/or diagnosis of SARS-CoV-2 by FDA under an Emergency Use Authorization (EUA). This EUA will remain  in effect (meaning this test can be used) for the duration of the COVID-19 declaration under Section 564(b)(1) of the Act, 21 U.S.C.section 360bbb-3(b)(1), unless the authorization is terminated  or revoked sooner.       Influenza A by PCR NEGATIVE NEGATIVE Final   Influenza B by PCR NEGATIVE NEGATIVE Final    Comment: (NOTE) The Xpert Xpress SARS-CoV-2/FLU/RSV  plus assay is intended as an aid in the diagnosis of influenza from Nasopharyngeal swab specimens and should not be used as a sole basis for treatment. Nasal washings and aspirates are unacceptable for Xpert Xpress SARS-CoV-2/FLU/RSV testing.  Fact Sheet for Patients: EntrepreneurPulse.com.au  Fact Sheet for Healthcare Providers: IncredibleEmployment.be  This test is not yet approved or cleared by the [place] FDA and has been authorized for detection and/or diagnosis of SARS-CoV-2 by FDA under an Emergency Use Authorization (EUA). This EUA will remain in effect (meaning this test can be used) for the duration of the COVID-19 declaration under Section 564(b)(1) of the Act, 21 U.S.C. section 360bbb-3(b)(1), unless the authorization is terminated or revoked.  Performed at [facility], [address] [place], [place] [address]   MRSA PCR Screening     Status: None   Collection Time: 07/29/2020 11:16 AM   Specimen: Nasal Mucosa; Nasopharyngeal  Result Value Ref Range Status   MRSA by PCR NEGATIVE NEGATIVE Final    Comment:        The GeneXpert MRSA Assay (FDA approved for NASAL specimens only), is one component of a comprehensive MRSA colonization surveillance program. It is not intended to diagnose MRSA infection nor to guide or monitor treatment for MRSA infections. Performed at [facility], [id number]  [address] [place], [place] [address]           IMAGING    DG Chest Port 1 View  Result Date: 08-27-2020 CLINICAL DATA:  Acute respiratory failure with hypoxia. EXAM: PORTABLE CHEST 1 VIEW COMPARISON:  07/29/2020 FINDINGS: External pacer paddles are stable. The cardiac silhouette, mediastinal and hilar contours are unchanged. Persistent asymmetric interstitial and airspace process in the lungs along with a possible small right pleural effusion. No pneumothorax. IMPRESSION: Persistent asymmetric interstitial and  airspace process. Electronically Signed   By: Marijo Sanes M.D.   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   Overall, patient is critically ill, prognosis is guarded.  Patient with Multiorgan failure and at high risk for cardiac arrest and death.   Patient is dying process  Corrin Parker, M.D.  [facility]  Medical Director [place] Director [facility] Cardio-Pulmonary Department

## 2020-08-19 NOTE — Progress Notes (Signed)
Husband at bedside and chaplain with him. 2 RN pronounced the patient's death. Time of death 26. [facility]-link will notify [facility]. Dr. Mortimer Fries notified.

## 2020-08-19 NOTE — Progress Notes (Signed)
Code Blue called for patient x3, patient time of death was 2:05 p.m.. Husband was initially at bedside as patient's crisis began. He was escorted by staff and myself to waiting area. After providing as much comfort as possible, I maintained presence at room with staff. Husband was eventually allowed to come in as life saving measures continued. He was visibly and understandably shaken. After staff approval he contacted his daughter to come. Later I returned, son and his wife was present, still waiting on daughter to arrive @ 3:26 p.m.

## 2020-08-19 NOTE — Progress Notes (Signed)
Progress Note  Patient Name: Judy Torres Date of Encounter: Aug 09, 2020  [facility] Cardiologist: Ida Rogue, MD   Subjective   Documented -1.5L UOP overnight. Weight showing increase 119>121lbs. Breathing status seemed to have worsened since yesterday. [place] Bipap. She reports chest pressure overnight. She is on IV nitro. BP stable. Creatinine stable.   Inpatient Medications    Scheduled Meds: . aspirin EC  81 mg Oral Daily  . Chlorhexidine Gluconate Cloth  6 each Topical Daily  . furosemide  40 mg Intravenous BID  . hydroxychloroquine  200 mg Oral BID  . predniSONE  5 mg Oral Q breakfast  . simvastatin  40 mg Oral QPM   Continuous Infusions: . heparin 750 Units/hr (2020-08-09 0708)  . nitroGLYCERIN 5 mcg/min (08/09/2020 0600)   PRN Meds: acetaminophen, atropine, fluticasone, HYDROcodone-acetaminophen, nitroGLYCERIN, ondansetron (ZOFRAN) IV   Vital Signs    Vitals:   08/09/2020 0700 08-09-20 0800 09-Aug-2020 0900 09-Aug-2020 0917  BP: 114/75 121/75 124/84   Pulse: 90 62 (!) 108   Resp: (!) 32 (!) 34 (!) 35   Temp:  97.6 F (36.4 C)    TempSrc:  Oral    SpO2: 97% (!) 72% 95% 98%  Weight:      Height:        Intake/Output Summary (Last 24 hours) at 08/09/2020 0936 Last data filed at August 09, 2020 0600 Gross per 24 hour  Intake 620.27 ml  Output 1500 ml  Net -879.73 ml   Last 3 Weights 08/09/2020 07/29/2020 08/02/2020  Weight (lbs) 121 lb 11.1 oz 119 lb 7.8 oz 117 lb  Weight (kg) 55.2 kg 54.2 kg 53.071 kg      Telemetry    Sinus tachy, HR around 100 - Personally Reviewed  ECG    No new - Personally Reviewed  Physical Exam   GEN: on Bipap Neck: + JVD Cardiac: RRR, no murmurs, rubs, or gallops.  Respiratory: diminished breath sounds. GI: Soft, nontender, non-distended  MS: No edema; No deformity. Neuro:  Nonfocal  Psych: Normal affect   Labs    High Sensitivity Troponin:   Recent Labs  Lab 08/18/2020 0317 08/14/2020 0500  TROPONINIHS 9,838* 8,440*       Chemistry Recent Labs  Lab 07/22/2020 0317 07/29/20 0735 08-09-20 0218  NA 139 135 137  K 3.4* 4.0 3.7  CL 99 98 100  CO2 24 19* 24  GLUCOSE 105* 152* 110*  BUN 26* 40* 44*  CREATININE 1.45* 1.66* 1.63*  CALCIUM 8.9 8.6* 7.9*  PROT 6.2*  --   --   ALBUMIN 3.7  --   --   AST 54*  --   --   ALT 13  --   --   ALKPHOS 60  --   --   BILITOT 0.8  --   --   GFRNONAA 38* 33* 33*  ANIONGAP 16* 18* 13     Hematology Recent Labs  Lab 08/18/2020 0317 07/29/20 0735 08-09-20 0218  WBC 10.9* 11.2* 11.1*  RBC 3.42* 3.14* 3.03*  HGB 10.5* 9.8* 9.3*  HCT 34.2* 30.8* 29.4*  MCV 100.0 98.1 97.0  MCH 30.7 31.2 30.7  MCHC 30.7 31.8 31.6  RDW 13.9 14.0 14.0  PLT 181 174 185    BNP Recent Labs  Lab 08/13/2020 0317 07/29/20 0735  BNP 3,543.3* >4,500.0*     DDimer No results for input(s): DDIMER Judy the last 168 hours.   Radiology    DG Chest Port 1 View  Result Date: 08-09-20  CLINICAL DATA:  Acute respiratory failure with hypoxia. EXAM: PORTABLE CHEST 1 VIEW COMPARISON:  07/29/2020 FINDINGS: External pacer paddles are stable. The cardiac silhouette, mediastinal and hilar contours are unchanged. Persistent asymmetric interstitial and airspace process Judy the lungs along with a possible small right pleural effusion. No pneumothorax. IMPRESSION: Persistent asymmetric interstitial and airspace process. Electronically Signed   By: Marijo Sanes M.D.   On: 08/11/2020 09:09   DG Chest Port 1 View  Result Date: 07/29/2020 CLINICAL DATA:  Congestive heart failure EXAM: PORTABLE CHEST 1 VIEW COMPARISON:  July 28, 2020 FINDINGS: There is cardiomegaly. The patient is status post prior median sternotomy. There are growing bilateral pleural effusions. There are worsening interstitial lung markings. There is fluid within the pleural fissures. There is no pneumothorax. No acute osseous abnormality. IMPRESSION: Worsening interstitial edema with growing bilateral pleural effusions.  Electronically Signed   By: Constance Holster M.D.   On: 07/29/2020 02:29   ECHOCARDIOGRAM COMPLETE  Result Date: 07/29/2020    ECHOCARDIOGRAM REPORT   Patient Name:   [place] Date of Exam: 08/09/2020 Medical Rec #:  [id number]       Height:       59.0 Judy Accession #:    [id number]      Weight:       117.0 lb Date of Birth:  [birth year]       BSA:          1.469 m Patient Age:    75 years        BP:           102/74 mmHg Patient Gender: F               HR:           98 bpm. Exam Location:  [facility] Procedure: 2D Echo, Cardiac Doppler and Color Doppler Indications:     NSTEMI I21.4  History:         Patient has prior history of Echocardiogram examinations, most                  recent 04/03/2019. COPD; Risk Factors:Hypertension.                  Cardiomyopathy, PVD.  Sonographer:     Sherrie Sport RDCS (AE) Referring Phys:  [id number] Athena Masse Diagnosing Phys: Nelva Bush MD IMPRESSIONS  1. Left ventricular ejection fraction, by estimation, is 30 to 35%. The left ventricle has moderately decreased function. The left ventricle demonstrates regional wall motion abnormalities (see scoring diagram/findings for description). There is mild left ventricular hypertrophy. Left ventricular diastolic parameters are consistent with Grade II diastolic dysfunction (pseudonormalization). Elevated left atrial pressure. There is severe hypokinesis of the left ventricular, entire inferolateral wall. There is akinesis of the left ventricular, basal inferior wall.  2. Right ventricular systolic function is moderately reduced. The right ventricular size is moderately enlarged. There is moderately elevated pulmonary artery systolic pressure.  3. Left atrial size was mildly dilated.  4. The mitral valve is degenerative. Severe mitral valve regurgitation. No evidence of mitral stenosis.  5. Tricuspid valve regurgitation is moderate.  6. The aortic valve is tricuspid. There is mild calcification of the aortic valve. There is  moderate thickening of the aortic valve. Aortic valve regurgitation is not visualized. Mild to moderate aortic valve sclerosis/calcification is present, without any evidence of aortic stenosis.  7. The inferior vena cava is dilated Judy size with <50% respiratory variability, suggesting right atrial pressure  of 15 mmHg. FINDINGS  Left Ventricle: Left ventricular ejection fraction, by estimation, is 30 to 35%. The left ventricle has moderately decreased function. The left ventricle demonstrates regional wall motion abnormalities. Severe hypokinesis of the left ventricular, entire  inferolateral wall. The left ventricular internal cavity size was normal Judy size. There is mild left ventricular hypertrophy. Left ventricular diastolic parameters are consistent with Grade II diastolic dysfunction (pseudonormalization). Elevated left atrial pressure. Right Ventricle: The right ventricular size is moderately enlarged. No increase Judy right ventricular wall thickness. Right ventricular systolic function is moderately reduced. There is moderately elevated pulmonary artery systolic pressure. The tricuspid  regurgitant velocity is 3.24 m/s, and with an assumed right atrial pressure of 15 mmHg, the estimated right ventricular systolic pressure is 13.2 mmHg. Left Atrium: Left atrial size was mildly dilated. Right Atrium: Right atrial size was normal Judy size. Pericardium: There is no evidence of pericardial effusion. Mitral Valve: The mitral valve is degenerative Judy appearance. There is moderate thickening of the mitral valve leaflet(s). There is mild calcification of the mitral valve leaflet(s). Mild mitral annular calcification. Severe mitral valve regurgitation. No evidence of mitral valve stenosis. Tricuspid Valve: The tricuspid valve is normal Judy structure. Tricuspid valve regurgitation is moderate. Aortic Valve: The aortic valve is tricuspid. There is mild calcification of the aortic valve. There is moderate thickening of the  aortic valve. Aortic valve regurgitation is not visualized. Mild to moderate aortic valve sclerosis/calcification is present, without any evidence of aortic stenosis. Aortic valve mean gradient measures 3.5 mmHg. Aortic valve peak gradient measures 5.2 mmHg. Aortic valve area, by VTI measures 2.39 cm. Pulmonic Valve: The pulmonic valve was not well visualized. Pulmonic valve regurgitation is mild. No evidence of pulmonic stenosis. Aorta: The aortic root and ascending aorta are structurally normal, with no evidence of dilitation. Pulmonary Artery: The pulmonary artery is not well seen. Venous: The inferior vena cava is dilated Judy size with less than 50% respiratory variability, suggesting right atrial pressure of 15 mmHg. IAS/Shunts: The atrial septum is grossly normal.  LEFT VENTRICLE PLAX 2D LVIDd:         4.88 cm      Diastology LVIDs:         4.33 cm      LV e' medial:    3.70 cm/s LV PW:         1.13 cm      LV E/e' medial:  34.9 LV IVS:        1.03 cm      LV e' lateral:   3.92 cm/s LVOT diam:     2.00 cm      LV E/e' lateral: 32.9 LV SV:         39 LV SV Index:   27 LVOT Area:     3.14 cm  LV Volumes (MOD) LV vol d, MOD A2C: 100.0 ml LV vol d, MOD A4C: 94.7 ml LV vol s, MOD A2C: 76.3 ml LV vol s, MOD A4C: 53.9 ml LV SV MOD A2C:     23.7 ml LV SV MOD A4C:     94.7 ml LV SV MOD BP:      34.1 ml RIGHT VENTRICLE RV Basal diam:  5.23 cm RV S prime:     10.70 cm/s TAPSE (M-mode): 1.2 cm LEFT ATRIUM             Index       RIGHT ATRIUM  Index LA diam:        4.50 cm 3.06 cm/m  RA Area:     15.40 cm LA Vol (A2C):   70.6 ml 48.07 ml/m RA Volume:   33.50 ml  22.81 ml/m LA Vol (A4C):   42.5 ml 28.94 ml/m LA Biplane Vol: 55.5 ml 37.79 ml/m  AORTIC VALVE                   PULMONIC VALVE AV Area (Vmax):    1.65 cm    PV Vmax:        0.66 m/s AV Area (Vmean):   1.46 cm    PV Peak grad:   1.8 mmHg AV Area (VTI):     2.39 cm    RVOT Peak grad: 3 mmHg AV Vmax:           114.50 cm/s AV Vmean:          84.400  cm/s AV VTI:            0.163 m AV Peak Grad:      5.2 mmHg AV Mean Grad:      3.5 mmHg LVOT Vmax:         60.10 cm/s LVOT Vmean:        39.200 cm/s LVOT VTI:          0.124 m LVOT/AV VTI ratio: 0.76  AORTA Ao Root diam: 2.95 cm MITRAL VALVE                TRICUSPID VALVE MV Area (PHT): 7.29 cm     TR Peak grad:   42.0 mmHg MV Decel Time: 104 msec     TR Vmax:        324.00 cm/s MV E velocity: 129.00 cm/s MV A velocity: 84.00 cm/s   SHUNTS MV E/A ratio:  1.54         Systemic VTI:  0.12 m                             Systemic Diam: 2.00 cm Nelva Bush MD Electronically signed by Nelva Bush MD Signature Date/Time: 07/29/2020/10:41:52 AM    Final     Cardiac Studies   LHC 1/10/202 [facility]): Diagnostic Dominance: Right  Left Main  Mid LM to Dist LM lesion is 100% stenosed. Vessel is not the culprit lesion. The lesion is type C, located proximal to the major branch, focal and discrete. The lesion is mildly calcified. The lesion was not previously treated. The stenosis was measured by a visual reading. Pressure wire/FFR was not performed on the lesion. IVUS was not performed.  Left Anterior Descending  Ost LAD lesion is 100% stenosed. Vessel is not the culprit lesion. The lesion is type C, located at the major branch, discrete and chronically occluded. The lesion was not previously treated. The stenosis was measured by a visual reading. Pressure wire/FFR was not performed on the lesion. IVUS was not performed.  Mid LAD lesion is 100% stenosed. Vessel is not the culprit lesion. The lesion is not complex (non high-C), located at the major branch and discrete. The lesion was not previously treated. The stenosis was measured by a visual reading. Pressure wire/FFR was not performed on the lesion. IVUS was not performed.  Left Circumflex  Collaterals  Dist Cx filled by collaterals from Dist LAD.    Right Coronary Artery  Mid RCA lesion is 100% stenosed. Vessel is not the  culprit lesion. The  lesion is type C, located at the major branch, eccentric, irregular and chronically occluded with bridging and right-to-right collateral flow. The lesion was not previously treated. The stenosis was measured by a visual reading. Pressure wire/FFR was not performed on the lesion. IVUS was not performed.  Right Posterior Descending Artery  Collaterals  RPDA filled by collaterals from Dist LAD.    LIMA Graft To Mid LAD  Graft To Mid Cx  Origin lesion is 100% stenosed. Vessel is the culprit lesion. The lesion is not complex (non high-C), located proximal to the major branch, focal and discrete. The lesion was not previously treated. The stenosis was measured by a visual reading. Pressure wire/FFR was not performed on the lesion. IVUS was not performed.   Intervention   Mid RCA lesion  Post-Intervention Lesion Assessment  The intervention was unsuccessful due to inability to cross the lesion with wire. Intentional subintimal strategy was not used. Embolic protection device was not deployed. The guidewire was not threaded through a graft to reach the lesion. Pre-interventional TIMI flow is 1. Post-intervention TIMI flow is 1. Treated lesion length: 20 mm. No complications occurred at this lesion. Pressure gradient/FFR was not measured. Marland KitchenUltrasound (IVUS) was not performed on the lesion.  There is a 100% residual stenosis post intervention.    Wall Motion       Resting       Severe global akinesis left ventricular enlargement ejection fraction less than 25%           Coronary Diagrams   Diagnostic Dominance: Right    Intervention     Conclusion   Mid LM to Dist LM lesion is 100% stenosed.  Mid RCA lesion is 100% stenosed.  Post intervention, there is a 100% residual stenosis.  Ost LAD lesion is 100% stenosed.  Mid LAD lesion is 100% stenosed.  Origin lesion is 100% stenosed.  100% occluded circumflex at the ostium  100% occluded vein graft to  circumflex system probable IRA  Widely patent LIMA to mid LAD  Extensive collaterals from LAD system to circumflex as well as distal RCA  Severely depressed left ventricular function globally left ventricular enlargement ejection fraction less than 25%  Unsuccessful attempt to intervene on the mid right coronary artery because of the CTO  Mynx was deployed  Clinical support was deferred since the patient did not appear to be Judy cardiogenic shock at the time _______________  2D echo 08/10/2020: Pending, with preliminary report of an EF 30-40% _______________  Carlton [facility] 03/2020: Pharmacological myocardial perfusion imaging study with no significant ischemia Normal wall motion, EF estimated at 37%, possibly suppressed secondary to GI uptake artifact versus conduction abnormality No EKG changes concerning for ischemia at peak stress or Judy recovery. CT attenuation correction images with moderate three-vessel coronary calcification, moderate aortic atherosclerosis Resting EKG with right bundle branch block Low risk scan _______________  2D echo 03/2019: 1. The left ventricle has mildly reduced systolic function, with an  ejection fraction of 45-50%. The cavity size was normal. There is mildly  increased left ventricular wall thickness. Left ventricular diastolic  Doppler parameters are consistent with  impaired relaxation. Left ventricular diffuse hypokinesis.  2. The right ventricle has normal systolic function. The cavity was  normal. There is no increase Judy right ventricular wall thickness. Right  ventricular systolic pressure is upper normal to mildly elevated (25-30  mmHg plus central venous pressure).  3. Right atrial size was mildly dilated.  4. The mitral valve  is degenerative. Mild thickening of the mitral valve  leaflet. There is mild mitral annular calcification present. No evidence  of mitral valve stenosis.  5. The tricuspid valve is not well visualized.  Tricuspid valve  regurgitation is mild-moderate.  6. The aortic valve is tricuspid. Moderate thickening of the aortic  valve.  7. The aorta is normal unless otherwise noted. _______________  2D echo 07/2018: - Left ventricle: The cavity size was normal. There was mild  concentric hypertrophy. Systolic function was normal. The  estimated ejection fraction was Judy the range of 50% to 55%.  Regional wall motion abnormalities cannot be excluded, Judy the  setting of bundle branch block. Doppler parameters are consistent  with abnormal left ventricular relaxation (grade 1 diastolic  dysfunction).  - Left atrium: The atrium was mildly dilated.  - Right ventricle: Systolic function was normal.  - Tricuspid valve: There was mild-moderate regurgitation.  - Pulmonary arteries: Systolic pressure was within the normal  range.     Patient Profile     75 y.o. female with pmh of CAD s/p CABG Judy 1308, systolic CHF, PAD with aortofemoral bypass Judy 2003, stage IVB large B cell lymphoma, lupus, COPD, HTN, HLD, and GERD who is being seen for HFrEF and NSTEMI.   Patient presented 1/10 with 4 days hx of chest tightness. She was hypoxic and mildly hypotensive. Repeat EKG showed CHB, 40bpm and given 2 doses of atropine. Also Judy the ED she developed VT with rates Judy the 130s. She underwent successful synchronized cardioversion with return to ST and stable BP. She underwent emergent LHC by Dr. Clayborn Bigness. Intervention was attempted to mid RCA though it was unsuccessful. LV gram showed EF<25%. [facility] took over care of patient.    Assessment & Plan    Acute hypoxic respiratory failure/HFrEF/ICM with EF<25% - suspected 2/2 acute on chronic heart failure - On admission BNP elevated. CXR showed right sided small pleural effusion, mild interstitial edema - Patient requiring Bipap - CXR today showed persistent asymmetric interstitial process - Echo showed LVEF 30-35% with WMA, severe MR - IV lasix  40mg  BID - On nitro drip - No BB given CHB and acute decompensated heart failure. No further CHB or VT seen on tele - Intolerance to ACEi/ARB - May consider transfer [facility] for management by Advanced CHF team - continue diuresis  NSTEMI/known CAD s/p CABG Judy 2004 - LHC as above with coronary artery not amenable to PCI - she repots chest pain  - IV heparin for 48-72 hours - continue Aspirin - No BB due to CHB and acute CHF - PTA simvastatin - Can consider PCI to RCA once heart failure has improved  Severe MR - New on echo this admission - will discuss plan with MD  AKI - creatinine 1.66>1.63 - IV lasix   Anemia - Baseline around ?12 - Hgb 9.3 today, monitor with heparin  COPD - no acute exacerbation     For questions or updates, please contact [place] Please consult [email or web address] for contact info under        Signed, Larrisha Babineau Ninfa Meeker, PA-C  08-13-2020, 9:36 AM

## 2020-08-19 NOTE — Progress Notes (Signed)
[place] consulted by charge RN as pt. took her last breaths; husband at bedside holding pt.'s hand as she was pronounced.  [place] remained at bedside w/husband for some time, facilitating his expression of grief and providing ministry of presence.  Husband says he and pt. when he was stationed in [place] during his time in the [employer]; he tearfully shared 'she was the best thing I ever took home with me'.  Husband awaits the arrival of his children who reportedly live nearby.  [place] left to attend to other emergent situation in ICU but remains available as needed.

## 2020-08-19 NOTE — TOC Initial Note (Signed)
Transition of Care [facility]) - Initial/Assessment Note    Patient Details  Name: Judy Torres MRN: [id number] Date of Birth: 1946-06-11  [facility]) CM/SW Contact:    Ova Freshwater Phone Number: [phone] 08-20-20, 11:55 AM  Clinical Narrative:                  CSW left voicemail for Ambar, Raphael (Spouse) [phone] (Mobile), requesting a return call.        Patient Goals and CMS Choice        Expected Discharge Plan and Services                                                Prior Living Arrangements/Services                       Activities of Daily Living Home Assistive Devices/Equipment: Grab bars Judy shower ADL Screening (condition at time of admission) Patient's cognitive ability adequate to safely complete daily activities?: Yes Is the patient deaf or have difficulty hearing?: No Does the patient have difficulty seeing, even when wearing glasses/contacts?: No Does the patient have difficulty concentrating, remembering, or making decisions?: No Patient able to express need for assistance with ADLs?: Yes Does the patient have difficulty dressing or bathing?: No Independently performs ADLs?: Yes (appropriate for developmental age) Does the patient have difficulty walking or climbing stairs?: Yes Weakness of Legs: None Weakness of Arms/Hands: None  Permission Sought/Granted                  Emotional Assessment              Admission diagnosis:  Respiratory distress [R06.03] Hypoxia [R09.02] NSTEMI (non-ST elevated myocardial infarction) ([place]) [I21.4] Community acquired pneumonia of right lower lobe of lung [J18.9] Acute on chronic congestive heart failure, unspecified heart failure type ([place]) [I50.9] Non-STEMI (non-ST elevated myocardial infarction) [facility]) [I21.4] Patient Active Problem List   Diagnosis Date Noted  . Acute on chronic congestive heart failure ([place])   . Mitral valve insufficiency    . Hx of CABG 08/09/2020  . RLL pneumonia 08/06/2020  . Acute respiratory failure with hypoxia ([place]) 08/16/2020  . AKI (acute kidney injury) ([place]) 07/19/2020  . History of toe surgery 07/25/20 07/19/2020  . Acute on chronic diastolic CHF (congestive heart failure) ([place]) 08/09/2020  . NSTEMI (non-ST elevated myocardial infarction) ([place]) 08/09/2020  . Non-STEMI (non-ST elevated myocardial infarction) ([place]) 08/13/2020  . History of colonic polyps   . Polyp of transverse colon   . Polyp of sigmoid colon   . Polyp of ascending colon   . Pain Judy limb 04/22/2020  . Port-A-Cath Judy place 02/07/2020  . Cardiomyopathy ([place]) 02/07/2020  . Elevated uric acid Judy blood 10/07/2019  . Tubular adenoma of colon 10/07/2019  . Decreased cardiac ejection fraction 06/25/2019  . Hypokalemia 06/21/2019  . Hypertensive heart disease with congestive heart failure ([place]) 03/05/2019  . 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 Diastolic heart failure ([place]) 01/18/2019  . Pulmonary emphysema ([place]) 01/18/2019  . Leukopenia 07/12/2018  . Thrombocytopenia ([place]) 05/14/2018  . Encounter for antineoplastic chemotherapy 04/24/2018  . Encounter for antineoplastic immunotherapy 04/24/2018  . Hypercalcemia 04/24/2018  . Goals of care, counseling/discussion 04/15/2018  . Diffuse large B cell lymphoma ([place]) 04/03/2018  . CLE (columnar lined esophagus)   .  Stomach irritation   . Postpyloric ulcer   . Melena   . Iron deficiency anemia due to chronic blood loss 03/07/2018  . Normocytic anemia 03/07/2018  . B12 deficiency 03/07/2018  . Congenital gastrointestinal vessel anomaly   . Hematochezia   . GIB (gastrointestinal bleeding) 02/04/2018  . GI bleed 12/28/2017  . SOBOE (shortness of breath on exertion) 09/19/2017  . AAA (abdominal aortic aneurysm) ([place]) 08/15/2014  . Hyperlipidemia 01/28/2014  . 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 Hypertension 07/09/2013  . 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 Osteoporosis 04/25/2013  . Peripheral vascular disease ([place]) 04/25/2013  . Allergic rhinitis 03/07/2013  . Chronic kidney disease, stage IV (severe) ([place]) 03/07/2013  . Proteinuria 03/07/2013  . Renal cyst 03/07/2013  . 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 GERD (gastroesophageal reflux disease) 03/06/2013  . Atherosclerotic heart disease of native coronary artery without angina pectoris 03/06/2013  . Raynaud's phenomenon 03/06/2013   PCP:  [name], MD Pharmacy:   [id number] - [place], [place] [place] [address] Phone: [phone] Fax: [phone]     Social Determinants of Health (SDOH) Interventions    Readmission Risk Interventions No flowsheet data found.

## 2020-08-19 NOTE — Progress Notes (Signed)
NAME:  [name], MRN:  [id number], DOB:  1946-03-13, LOS: 2 ADMISSION DATE:  08/11/2020, CONSULTATION DATE: 07/29/2020 REFERRING MD: Dr. Saunders Revel, CHIEF COMPLAINT: Shortness of Breath  Brief History:  75 yo female admitted with acute hypoxic respiratory failure secondary to acute on chronic HFrEF and NSTEMI s/p cardiac cath which revealed severe multivessel CAD with unsuccessful intervention attempt of the native mid RCA lesion requiring continuous Bipap, nitroglycerin gtt, and aggressive diuresis   History of Present Illness:  This is a 75 yo female who presented to [facility] ER via EMS on 01/10 with acute onset of shortness of breath.  EMS reported pt hypoxic upon their arrival O2 sats 83% on RA.  She was placed on O2 @3L , and EMS administered albuterol treatment with improvement of oxygenation.  Upon arrival to the ER pt afebrile, tachycardic hr 102, mildly tachypneic, and CXR concerning for pneumonia along with interstitial edema pt received levaquin x1 dose.  Lab results significant for troponin 9,838 and BNP 3,543.3, therefore heparin gtt initiated and Cardiology consulted.  Pt subsequently admitted to the progressive care unit per hospitalist team for additional workup and treatment but remained in the ER pending bed availability.  On the morning of 01/10 while in the ER pt developed worsening acute hypoxic respiratory with tachypnea; was found slumped down in the bed; tachycardic; and diaphoretic.  ER physician arrived at bedside recommended placing pt on continuous Bipap. However, pt bradycardic EKG revealed complete heart block, hr 40.  Pt received atropine x2 along with 500 ml fluid bolus with temporary improvement in heart rate.  Cardiologist Dr. Rockey Situ consulted and concerned for NSTEMI. Dr. Rockey Situ recommended contacting on-call STEMI provider Dr. Clayborn Bigness for possible need for cardiac cath.  Pt later developed ventricular tachycardia requiring defibrillation x1, post cardiac rhythm sinus atrial  tachycardia hr 119 with right bundle branch block.  Pt transported emergently to cardiac cath lab 01/10.  Cardiac cath revealed severe multivessel CAD (see detailed report below). Intervention was attempted on the native mid RCA lesion but was unsuccessful due to inability to cross the lesion with wire.  LV gram with an EF of less than 25%.  She was subsequently transferred to ICU post procedure on NRB.  On 01/11 pt c/o worsening hypoxia requiring continuous Bipap.  [facility] Cardiology consulted recommended lasix and nitroglycerin gtts. Per cardiology if pts respiratory status did not improve pt would require mechanical intubation and transfer to [facility] for heart failure management.    Past Medical History:  Raynaud's Disease Polyp of Ascending Colon Lupus PVD Lupus Nephritis, ISN/RPS class IV HLD HTN GERD DDD Stage IV CKD Arthritis Stomach Cancer  Anemia secondary to vitamin B12 deficiency  Significant Hospital Events:  01/10: Pt presented to [facility] ER with acute hypoxic respiratory failure requiring admission to the PCU.  However, remained in the ER pending bed availability 01/10: Code STEMI initiated, pt underwent emergent cardiac cath and required transfer to ICU post procedure 01/11: Pt developed worsening acute hypoxic respiratory failure requiring Bipap.  [place] [facility] and PCCM team consulted to assist with management.  Per cardiology pt placed on nitroglycerin gtt and 40 mg iv lasix bid  Consults:  Intensivist  Cardiology   Procedures:  Cardiac Catheterization 01/10  Significant Diagnostic Tests:  Cardiac catheterization 01/10>>Mid LM to Dist LM lesion is 100% stenosed. Mid RCA lesion is 100% stenosed. Post intervention, there is a 100% residual stenosis. Ost LAD lesion is 100% stenosed. Mid LAD lesion is 100% stenosed. Origin lesion is 100% stenosed.  100% occluded circumflex at the ostium 100% occluded vein graft to circumflex system probable IRA Widely patent LIMA to mid  LA Extensive collaterals from LAD system to circumflex as well as distal RCA Severely depressed left ventricular function globally left ventricular enlargement ejection fraction less than 25% Unsuccessful attempt to intervene on the mid right coronary artery because of the CTO Mynx was deployed Clinical support was deferred since the patient did not appear to be in cardiogenic shock at the time    Echo 01/10>>EF 30 to 35%   Micro Data:  Blood x2 01/10>>negative  MRSA PCR 01/10>>negative  COVID-19/Influenza PCR 01/10>>negative   Antimicrobials:  Levaquin 01/10 x1 dose   Interim History / Subjective:  Pt remains on Bipap denies shortness of breath. States she did not sleep last night due to Bipap noise.  Will attempt to transition off Bipap to [facility].   Objective   Blood pressure 103/63, pulse 98, temperature (!) 97.4 F (36.3 C), temperature source Oral, resp. rate (!) 26, height 4\' 11"  (1.499 m), weight 55.2 kg, SpO2 98 %.        Intake/Output Summary (Last 24 hours) at 2020-08-27 0839 Last data filed at Aug 27, 2020 0600 Gross per 24 hour  Intake 740.27 ml  Output 1500 ml  Net -759.73 ml   Filed Weights   08/11/2020 0300 07/29/20 0500 2020-08-27 0452  Weight: 53.1 kg 54.2 kg 55.2 kg   Examination: General: acutely ill appearing female, mild respiratory distress on Bipap  HENT: supple, JVD present  Lungs: diminished throughout, slightly tachypneic, non labored  Cardiovascular: sinus rhythm, S1 S2, no R/G, 2+ radial and 1+ distal pulses  Abdomen: +BS x4, obese, soft, non tender, non distended  Extremities: LLE bandage in place recent arthrodesis left first MTPJ (01/7) Neuro: alert, follows commands, PERRLA  GU: foley catheter making urine   Assessment & Plan:   Acute on chronic hypoxic respiratory failure secondary to acute on chronic HFrEF CAD with NSTEMI   HLD  Hx: CABG  Emergent LHC 01/10 with CAD not amendable to percutaneous intervention  Continuous telemetry monitoring   Continue heparin gtt-dosing per pharmacy  Cardiology consulted appreciate input-nitroglycerin gtt and aggressive diuresis if pts respiratory status does not improve or worsens will need mechanical intubation and transfer to [facility] for heart failure management  Prn Bipap or supplemental O2 for dyspnea and/or hypoxia  Continue aspirin and simvastatin  Will repeat CXR   Acute on chronic renal failure  Trend BMP  Replace electrolytes as indicated  Monitor UOP Avoid nephrotoxic medications when able   Anemia without obvious acute blood loss  Trend CBC  Monitor for s/sx of bleeding and transfuse for hgb <7  Lupus  Continue outpatient hydroxychloroquine   Best practice (evaluated daily)  Diet: Heart Healthy when able to wean off Bipap  Pain/Anxiety/Delirium protocol (if indicated): Not indicated  VAP protocol (if indicated): Not indicated  DVT prophylaxis: Heparin gtt  GI prophylaxis: Not indicated  Glucose control: Not indicated  Mobility: Bedrest  Disposition: ICU   Goals of Care:  Last date of multidisciplinary goals of care discussion: 08-27-2020 Family and staff present: Discussed plan of care with pt and pts husband who is at bedside  Summary of discussion: Discussed plan of care Follow up goals of care discussion due: 07/31/2020 Code Status: Full code   Labs   CBC: Recent Labs  Lab 07/19/2020 0317 07/29/20 0735 Aug 27, 2020 0218  WBC 10.9* 11.2* 11.1*  NEUTROABS 8.6*  --   --   HGB  10.5* 9.8* 9.3*  HCT 34.2* 30.8* 29.4*  MCV 100.0 98.1 97.0  PLT 181 174 161    Basic Metabolic Panel: Recent Labs  Lab 08/07/2020 0317 07/29/20 0735 08/26/20 0218  NA 139 135 137  K 3.4* 4.0 3.7  CL 99 98 100  CO2 24 19* 24  GLUCOSE 105* 152* 110*  BUN 26* 40* 44*  CREATININE 1.45* 1.66* 1.63*  CALCIUM 8.9 8.6* 7.9*  MG  --   --  2.5*  PHOS  --   --  4.6   GFR: Estimated Creatinine Clearance: 23.6 mL/min (A) (by C-G formula based on SCr of 1.63 mg/dL (H)). Recent Labs   Lab 07/27/2020 0317 08/13/2020 0500 07/29/20 0735 07/29/20 0856 2020/08/26 0218  PROCALCITON <0.10  --   --   --   --   WBC 10.9*  --  11.2*  --  11.1*  LATICACIDVEN 2.0* 2.5*  --  1.8  --     Liver Function Tests: Recent Labs  Lab 07/23/2020 0317  AST 54*  ALT 13  ALKPHOS 60  BILITOT 0.8  PROT 6.2*  ALBUMIN 3.7   No results for input(s): LIPASE, AMYLASE in the last 168 hours. No results for input(s): AMMONIA in the last 168 hours.  ABG No results found for: PHART, PCO2ART, PO2ART, HCO3, TCO2, ACIDBASEDEF, O2SAT   Coagulation Profile: Recent Labs  Lab 08/11/2020 0500  INR 1.0    Cardiac Enzymes: No results for input(s): CKTOTAL, CKMB, CKMBINDEX, TROPONINI in the last 168 hours.  HbA1C: No results found for: HGBA1C  CBG: Recent Labs  Lab 07/25/2020 1104 07/29/20 1125  GLUCAP 134* 107*    Review of Systems: Positives in BOLD   Gen: Denies fever, chills, weight change, fatigue, night sweats HEENT: Denies blurred vision, double vision, hearing loss, tinnitus, sinus congestion, rhinorrhea, sore throat, neck stiffness, dysphagia PULM: shortness of breath, cough, sputum production, hemoptysis, wheezing CV: chest tightness, edema, orthopnea, paroxysmal nocturnal dyspnea, palpitations GI: Denies abdominal pain, nausea, vomiting, diarrhea, hematochezia, melena, constipation, change in bowel habits GU: Denies dysuria, hematuria, polyuria, oliguria, urethral discharge Endocrine: Denies hot or cold intolerance, polyuria, polyphagia or appetite change Derm: Denies rash, dry skin, scaling or peeling skin change Heme: Denies easy bruising, bleeding, bleeding gums Neuro: Denies headache, numbness, weakness, slurred speech, loss of memory or consciousness  Past Medical History:  She,  has a past medical history of Anemia, Arthritis, Cancer ([place]), Cardiomyopathy ([place]), CHF (congestive heart failure) ([place]), Chronic kidney disease, COPD (chronic obstructive pulmonary disease) ([place]),  DDD (degenerative disc disease), cervical, GERD (gastroesophageal reflux disease), HTN (hypertension), Hyperlipidemia, Lupus ([place]), Lupus nephritis, ISN/RPS class IV ([place]), Peripheral vascular disease ([place]), Polyp of ascending colon (05/2020), Raynaud's disease, S/P angioplasty with stent, and Wears dentures.   Surgical History:   Past Surgical History:  Procedure Laterality Date  . ABDOMINAL HYSTERECTOMY    . AORTO-FEMORAL BYPASS GRAFT Left 2003  . ARTHRODESIS METATARSALPHALANGEAL JOINT (MTPJ) Left 07/25/2020   Procedure: ARTHRODESIS 1ST METATARSALPHALANGEAL JOINT (MTPJ) LEFT;  Surgeon: Samara Deist, DPM;  Location: [facility] ORS;  Service: Podiatry;  Laterality: Left;  . CARDIAC CATHETERIZATION     stent  . CARDIAC SURGERY    . CATARACT EXTRACTION W/PHACO Right 11/15/2019   Procedure: CATARACT EXTRACTION PHACO AND INTRAOCULAR LENS PLACEMENT (IOC) RIGHT  VISION BLUE 7.48 00:45.8;  Surgeon: Marchia Meiers, MD;  Location: [place];  Service: Ophthalmology;  Laterality: Right;  Port a cath  . CATARACT EXTRACTION W/PHACO Left 12/20/2019   Procedure: CATARACT EXTRACTION PHACO  AND INTRAOCULAR LENS PLACEMENT ([place]) LEFT VISION BLUE;  Surgeon: Marchia Meiers, MD;  Location: [place];  Service: Ophthalmology;  Laterality: Left;  6.170:36.1  . CESAREAN SECTION    . COLONOSCOPY N/A 12/30/2017   Procedure: COLONOSCOPY;  Surgeon: Lin Landsman, MD;  Location: [facility] ENDOSCOPY;  Service: Gastroenterology;  Laterality: N/A;  . COLONOSCOPY WITH PROPOFOL N/A 05/23/2020   Procedure: COLONOSCOPY WITH PROPOFOL;  Surgeon: Virgel Manifold, MD;  Location: [facility] ENDOSCOPY;  Service: Endoscopy;  Laterality: N/A;  . CORONARY ARTERY BYPASS GRAFT  2004  . CORONARY/GRAFT ACUTE MI REVASCULARIZATION N/A 07/20/2020   Procedure: Coronary/Graft Acute MI Revascularization;  Surgeon: Yolonda Kida, MD;  Location: [place] CV LAB;  Service: Cardiovascular;  Laterality: N/A;  . CT SIM FOR RADIATION  THERAPY ([place] [facility]X)  2020  . ESOPHAGOGASTRODUODENOSCOPY N/A 12/30/2017   Procedure: ESOPHAGOGASTRODUODENOSCOPY (EGD);  Surgeon: Lin Landsman, MD;  Location[facility] ENDOSCOPY;  Service: Gastroenterology;  Laterality: N/A;  . ESOPHAGOGASTRODUODENOSCOPY Left 02/05/2018   Procedure: ESOPHAGOGASTRODUODENOSCOPY (EGD);  Surgeon: Virgel Manifold, MD;  Location: [facility] ENDOSCOPY;  Service: Endoscopy;  Laterality: Left;  . ESOPHAGOGASTRODUODENOSCOPY (EGD) WITH PROPOFOL N/A 03/15/2018   Procedure: ESOPHAGOGASTRODUODENOSCOPY (EGD) WITH PROPOFOL;  Surgeon: Virgel Manifold, MD;  Location: [facility] ENDOSCOPY;  Service: Endoscopy;  Laterality: N/A;  . EYE SURGERY    . FLEXIBLE SIGMOIDOSCOPY N/A 12/31/2017   Procedure: FLEXIBLE SIGMOIDOSCOPY;  Surgeon: Jonathon Bellows, MD;  Location: [facility] ENDOSCOPY;  Service: Gastroenterology;  Laterality: N/A;  . LEFT HEART CATH AND CORONARY ANGIOGRAPHY N/A 08/02/2020   Procedure: LEFT HEART CATH AND CORONARY ANGIOGRAPHY;  Surgeon: Yolonda Kida, MD;  Location: [place] CV LAB;  Service: Cardiovascular;  Laterality: N/A;  . PORTA CATH INSERTION N/A 04/19/2018   Procedure: PORTA CATH INSERTION;  Surgeon: Algernon Huxley, MD;  Location: [place] CV LAB;  Service: Cardiovascular;  Laterality: N/A;  . PORTA CATH REMOVAL N/A 02/28/2020   Procedure: PORTA CATH REMOVAL;  Surgeon: Algernon Huxley, MD;  Location: [place] CV LAB;  Service: Cardiovascular;  Laterality: N/A;     Social History:   reports that she quit smoking about 18 years ago. Her smoking use included cigarettes. She has a 10.00 pack-year smoking history. She has never used smokeless tobacco. She reports previous alcohol use. She reports that she does not use drugs.   Family History:  Her family history includes Hypertension in her mother and sister; Stroke in her mother; Vascular Disease in her mother.   Allergies Allergies  Allergen Reactions  . Penicillins Other (See Comments)    Pass out Has  patient had a PCN reaction causing immediate rash, facial/tongue/throat swelling, SOB or lightheadedness with hypotension: Yes Has patient had a PCN reaction causing severe rash involving mucus membranes or skin necrosis: No Has patient had a PCN reaction that required hospitalization: No Has patient had a PCN reaction occurring within the last 10 years: No If all of the above answers are "NO", then may proceed with Cephalosporin use.   . Ace Inhibitors Nausea Only  . Losartan Anxiety  . Spironolactone Other (See Comments)    Felt poorly but couldn't clarify   . Sulfa Antibiotics Nausea Only  . Sulfasalazine Nausea And Vomiting          Home Medications  Prior to Admission medications   Medication Sig Start Date End Date Taking? Authorizing Provider  acetaminophen (TYLENOL) 500 MG tablet Take 1,000 mg by mouth 2 (two) times daily as needed for moderate pain or  headache.    [provider]  alendronate (FOSAMAX) 70 MG tablet Take 70 mg by mouth every Saturday.     [provider]  amLODipine (NORVASC) 10 MG tablet Take 10 mg by mouth daily. 08/15/18   [provider]  fluticasone (FLONASE) 50 MCG/ACT nasal spray Place 1 spray into both nostrils daily as needed for allergies or rhinitis.    [provider]  HYDROcodone-acetaminophen (NORCO/VICODIN) 5-325 MG tablet Take 1 tablet by mouth 2 (two) times daily as needed (pain.). 06/04/20   [provider]  hydroxychloroquine (PLAQUENIL) 200 MG tablet Take 200 mg by mouth 2 (two) times daily.  01/22/14   [provider]  oxyCODONE-acetaminophen (PERCOCET) 5-325 MG tablet Take 1-2 tablets by mouth every 6 (six) hours as needed for severe pain. Max 6 tabs per day 07/25/20   Samara Deist, DPM  polyethylene glycol [facility] / GLYCOLAX) packet Take 17 g by mouth daily.    [provider]  predniSONE (DELTASONE) 5 MG tablet Take 5 mg by mouth daily with breakfast.  12/12/13   [provider]  Pseudoephedrine-APAP 30-325 MG TABS Take 1 tablet by mouth daily as needed (sinuses).     [provider]  simvastatin (ZOCOR) 40 MG tablet Take 40 mg by mouth every evening. 08/15/14   [provider]     Critical care time: 25 minutes     Marda Stalker, [place] Pager [phone] (please enter 7 digits) PCCM Consult Pager [phone] (please enter 7 digits)

## 2020-08-19 NOTE — Progress Notes (Signed)
PROGRESS NOTE    In Margreat Widener  FOY:[id number] DOB: Apr 24, 1946 DOA: 07/25/2020 PCP: [name], MD  Assessment & Plan:   Active Problems:   Systemic lupus (HCC)   Hypertension   Diffuse large B cell lymphoma (HCC)   Atherosclerotic heart disease of native coronary artery without angina pectoris   Diastolic heart failure (HCC)   Hx of CABG   RLL pneumonia   Acute respiratory failure with hypoxia (HCC)   AKI (acute kidney injury) ([place])   History of toe surgery 07/25/20   Acute on chronic diastolic CHF (congestive heart failure) (HCC)   NSTEMI (non-ST elevated myocardial infarction) ([place])   Non-STEMI (non-ST elevated myocardial infarction) ([place])   Cardiac arrest: x 2. Pt was intubated & ventilated but pt continued to decline. A discussion was had between pt's husband & ICU physician and pt was made DNR. Unfortunately pt passed away   Acute hypoxic respiratory failure: likely secondary decompensated CHF. EMS reporting O2 sat of 83% on room air requiring 2 to 3 L to keep sats in the mid 90s. Etiology unclear. Continue on supplemental oxygen and wean as tolerated. Intubated & ventilated   Acute on chronic diastolic CHF: continue on IV lasix & IV nitro as per cardio. Monitor I/Os. Cardio following and recs apprec   NSTEMI: continue on IV heparin & IV nitro as per cardio. Continue on metoprolol, statin. S/p cardiac cath w/ coronary artery not amenable to stent placement. Hx of CABG. If pt decompensates further and requires intubation/ventilation, pt will be transferred to [facility] for advanced HF team to take over  Possible sepsis : sepsis not suspected as pt has tachycardia, tachypneic, elevated lactic acid but no source of infection. COVID19 neg. Procal < 0.10   Leukocytosis: likely reactive. Will continue to monitor   AKI: baseline Cr is 0.98. Cr is trending up today. Avoid nephrotoxic meds    SLE: continue on home dose of hydroxychloroquine  HTN: continue on metoprolol,  lasix & nitro as per cardio   Diffuse large B cell lymphoma: management per oncology outpatient, Dr. Mike Gip   DVT prophylaxis: heparin drip  Code Status: full  Family Communication: discussed pt's care w/ pt's family at bedside and answered their questions   Disposition Plan: pt passed away         Consultants:  Cardio Vascular surg ICU   Procedures:   Antimicrobials:    Subjective: Pt c/o shortness of breath this morning.   Objective: Vitals:   05-Aug-2020 0452 08/05/2020 0500 08/05/2020 0600 Aug 05, 2020 0632  BP:  112/77 103/63   Pulse:  82 89 98  Resp:  19 (!) 26   Temp:      TempSrc:      SpO2:  100% 100% 98%  Weight: 55.2 kg     Height:        Intake/Output Summary (Last 24 hours) at 05-Aug-2020 0857 Last data filed at 08-05-20 0600 Gross per 24 hour  Intake 740.27 ml  Output 1500 ml  Net -759.73 ml   Filed Weights   08/13/2020 0300 07/29/20 0500 2020-08-05 0452  Weight: 53.1 kg 54.2 kg 55.2 kg    Examination:  General exam: Appears uncomfortable Respiratory system: decreased breath sounds b/l  Cardiovascular system: S1/S2+. No clicks or rubs  Gastrointestinal system: Abdomen is nondistended, soft and nontender. Hypoactive bowel sounds  Central nervous system: Moves all 4 extremities  Psychiatry: Judgement and insight appear abnormal. Flat mood and affect   (PE was done this morning, prior  to cardiac arrests & death)  Data Reviewed: I have personally reviewed following labs and imaging studies  CBC: Recent Labs  Lab 07/31/2020 0317 07/29/20 0735 2020/08/05 0218  WBC 10.9* 11.2* 11.1*  NEUTROABS 8.6*  --   --   HGB 10.5* 9.8* 9.3*  HCT 34.2* 30.8* 29.4*  MCV 100.0 98.1 97.0  PLT 181 174 841   Basic Metabolic Panel: Recent Labs  Lab 08/03/2020 0317 07/29/20 0735 08/05/2020 0218  NA 139 135 137  K 3.4* 4.0 3.7  CL 99 98 100  CO2 24 19* 24  GLUCOSE 105* 152* 110*  BUN 26* 40* 44*  CREATININE 1.45* 1.66* 1.63*  CALCIUM 8.9 8.6* 7.9*  MG  --    --  2.5*  PHOS  --   --  4.6   GFR: Estimated Creatinine Clearance: 23.6 mL/min (A) (by C-G formula based on SCr of 1.63 mg/dL (H)). Liver Function Tests: Recent Labs  Lab 08/08/2020 0317  AST 54*  ALT 13  ALKPHOS 60  BILITOT 0.8  PROT 6.2*  ALBUMIN 3.7   No results for input(s): LIPASE, AMYLASE in the last 168 hours. No results for input(s): AMMONIA in the last 168 hours. Coagulation Profile: Recent Labs  Lab 07/27/2020 0500  INR 1.0   Cardiac Enzymes: No results for input(s): CKTOTAL, CKMB, CKMBINDEX, TROPONINI in the last 168 hours. BNP (last 3 results) No results for input(s): PROBNP in the last 8760 hours. HbA1C: No results for input(s): HGBA1C in the last 72 hours. CBG: Recent Labs  Lab 08/07/2020 1104 07/29/20 1125  GLUCAP 134* 107*   Lipid Profile: Recent Labs    07/29/20 0735  CHOL 172  HDL 86  LDLCALC 72  TRIG 71  CHOLHDL 2.0   Thyroid Function Tests: No results for input(s): TSH, T4TOTAL, FREET4, T3FREE, THYROIDAB in the last 72 hours. Anemia Panel: No results for input(s): VITAMINB12, FOLATE, FERRITIN, TIBC, IRON, RETICCTPCT in the last 72 hours. Sepsis Labs: Recent Labs  Lab 07/21/2020 0317 08/04/2020 0500 07/29/20 0856  PROCALCITON <0.10  --   --   LATICACIDVEN 2.0* 2.5* 1.8    Recent Results (from the past 240 hour(s))  SARS CORONAVIRUS 2 (TAT 6-24 HRS) Nasopharyngeal Nasopharyngeal Swab     Status: None   Collection Time: 07/23/20 10:17 AM   Specimen: Nasopharyngeal Swab  Result Value Ref Range Status   SARS Coronavirus 2 NEGATIVE NEGATIVE Final    Comment: (NOTE) SARS-CoV-2 target nucleic acids are NOT DETECTED.  The SARS-CoV-2 RNA is generally detectable in upper and lower respiratory specimens during the acute phase of infection. Negative results do not preclude SARS-CoV-2 infection, do not rule out co-infections with other pathogens, and should not be used as the sole basis for treatment or other patient management  decisions. Negative results must be combined with clinical observations, patient history, and epidemiological information. The expected result is Negative.  Fact Sheet for Patients: SugarRoll.be  Fact Sheet for Healthcare Providers: https://www.woods-mathews.com/  This test is not yet approved or cleared by the [place] FDA and  has been authorized for detection and/or diagnosis of SARS-CoV-2 by FDA under an Emergency Use Authorization (EUA). This EUA will remain  in effect (meaning this test can be used) for the duration of the COVID-19 declaration under Se ction 564(b)(1) of the Act, 21 U.S.C. section 360bbb-3(b)(1), unless the authorization is terminated or revoked sooner.  Performed at [facility], [place] [address], [place], [place] [address]   Culture, blood (routine x 2)  Status: None (Preliminary result)   Collection Time: 08/12/2020  3:19 AM   Specimen: BLOOD  Result Value Ref Range Status   Specimen Description BLOOD LEFT ANTECUBITAL  Final   Special Requests   Final    BOTTLES DRAWN AEROBIC AND ANAEROBIC Blood Culture adequate volume   Culture   Final    NO GROWTH 2 DAYS Performed at [facility], [address], [place], [place] [address]    Report Status PENDING  Incomplete  Culture, blood (routine x 2)     Status: None (Preliminary result)   Collection Time: 07/19/2020  3:19 AM   Specimen: BLOOD  Result Value Ref Range Status   Specimen Description BLOOD BLOOD RIGHT ARM  Final   Special Requests   Final    BOTTLES DRAWN AEROBIC AND ANAEROBIC Blood Culture adequate volume   Culture   Final    NO GROWTH 2 DAYS Performed at [facility], [address], [place],  [address]    Report Status PENDING  Incomplete  Resp Panel by RT-PCR (Flu A&B, Covid) Nasopharyngeal Swab     Status: None   Collection Time: 07/31/2020  4:42 AM   Specimen: Nasopharyngeal Swab; Nasopharyngeal(NP) swabs in vial  transport medium  Result Value Ref Range Status   SARS Coronavirus 2 by RT PCR NEGATIVE NEGATIVE Final    Comment: (NOTE) SARS-CoV-2 target nucleic acids are NOT DETECTED.  The SARS-CoV-2 RNA is generally detectable in upper respiratory specimens during the acute phase of infection. The lowest concentration of SARS-CoV-2 viral copies this assay can detect is 138 copies/mL. A negative result does not preclude SARS-Cov-2 infection and should not be used as the sole basis for treatment or other patient management decisions. A negative result may occur with  improper specimen collection/handling, submission of specimen other than nasopharyngeal swab, presence of viral mutation(s) within the areas targeted by this assay, and inadequate number of viral copies(<138 copies/mL). A negative result must be combined with clinical observations, patient history, and epidemiological information. The expected result is Negative.  Fact Sheet for Patients:  EntrepreneurPulse.com.au  Fact Sheet for Healthcare Providers:  IncredibleEmployment.be  This test is no t yet approved or cleared by the [place] FDA and  has been authorized for detection and/or diagnosis of SARS-CoV-2 by FDA under an Emergency Use Authorization (EUA). This EUA will remain  in effect (meaning this test can be used) for the duration of the COVID-19 declaration under Section 564(b)(1) of the Act, 21 U.S.C.section 360bbb-3(b)(1), unless the authorization is terminated  or revoked sooner.       Influenza A by PCR NEGATIVE NEGATIVE Final   Influenza B by PCR NEGATIVE NEGATIVE Final    Comment: (NOTE) The Xpert Xpress SARS-CoV-2/FLU/RSV plus assay is intended as an aid in the diagnosis of influenza from Nasopharyngeal swab specimens and should not be used as a sole basis for treatment. Nasal washings and aspirates are unacceptable for Xpert Xpress SARS-CoV-2/FLU/RSV testing.  Fact  Sheet for Patients: EntrepreneurPulse.com.au  Fact Sheet for Healthcare Providers: IncredibleEmployment.be  This test is not yet approved or cleared by the [place] FDA and has been authorized for detection and/or diagnosis of SARS-CoV-2 by FDA under an Emergency Use Authorization (EUA). This EUA will remain in effect (meaning this test can be used) for the duration of the COVID-19 declaration under Section 564(b)(1) of the Act, 21 U.S.C. section 360bbb-3(b)(1), unless the authorization is terminated or revoked.  Performed at [facility], [address] [place],  [place] [address]   MRSA PCR Screening     Status: None   Collection Time: 08/02/2020 11:16 AM   Specimen: Nasal Mucosa; Nasopharyngeal  Result Value Ref Range Status   MRSA by PCR NEGATIVE NEGATIVE Final    Comment:        The GeneXpert MRSA Assay (FDA approved for NASAL specimens only), is one component of a comprehensive MRSA colonization surveillance program. It is not intended to diagnose MRSA infection nor to guide or monitor treatment for MRSA infections. Performed at [facility], [address] [place], [place] [address]          Radiology Studies: DG Chest [place] 1 View  Result Date: 07/29/2020 CLINICAL DATA:  Congestive heart failure EXAM: PORTABLE CHEST 1 VIEW COMPARISON:  July 28, 2020 FINDINGS: There is cardiomegaly. The patient is status post prior median sternotomy. There are growing bilateral pleural effusions. There are worsening interstitial lung markings. There is fluid within the pleural fissures. There is no pneumothorax. No acute osseous abnormality. IMPRESSION: Worsening interstitial edema with growing bilateral pleural effusions. Electronically Signed   By: Constance Holster M.D.   On: 07/29/2020 02:29   ECHOCARDIOGRAM COMPLETE  Result Date: 07/29/2020    ECHOCARDIOGRAM REPORT   Patient Name:   W[place] Date of Exam:  07/29/2020 Medical Rec #:  [id number]       Height:       59.0 in Accession #:    [id number]      Weight:       117.0 lb Date of Birth:  [birth year]       BSA:          1.469 m Patient Age:    75 years        BP:           102/74 mmHg Patient Gender: F               HR:           98 bpm. Exam Location:  [facility] Procedure: 2D Echo, Cardiac Doppler and Color Doppler Indications:     NSTEMI I21.4  History:         Patient has prior history of Echocardiogram examinations, most                  recent 04/03/2019. COPD; Risk Factors:Hypertension.                  Cardiomyopathy, PVD.  Sonographer:     Sherrie Sport RDCS (AE) Referring Phys:  [id number] Athena Masse Diagnosing Phys: Nelva Bush MD IMPRESSIONS  1. Left ventricular ejection fraction, by estimation, is 30 to 35%. The left ventricle has moderately decreased function. The left ventricle demonstrates regional wall motion abnormalities (see scoring diagram/findings for description). There is mild left ventricular hypertrophy. Left ventricular diastolic parameters are consistent with Grade II diastolic dysfunction (pseudonormalization). Elevated left atrial pressure. There is severe hypokinesis of the left ventricular, entire inferolateral wall. There is akinesis of the left ventricular, basal inferior wall.  2. Right ventricular systolic function is moderately reduced. The right ventricular size is moderately enlarged. There is moderately elevated pulmonary artery systolic pressure.  3. Left atrial size was mildly dilated.  4. The mitral valve is degenerative. Severe mitral valve regurgitation. No evidence of mitral stenosis.  5. Tricuspid valve regurgitation is moderate.  6. The aortic valve is tricuspid. There is mild calcification of the aortic valve. There is moderate thickening of the aortic valve. Aortic  valve regurgitation is not visualized. Mild to moderate aortic valve sclerosis/calcification is present, without any evidence of aortic stenosis.  7. The inferior  vena cava is dilated in size with <50% respiratory variability, suggesting right atrial pressure of 15 mmHg. FINDINGS  Left Ventricle: Left ventricular ejection fraction, by estimation, is 30 to 35%. The left ventricle has moderately decreased function. The left ventricle demonstrates regional wall motion abnormalities. Severe hypokinesis of the left ventricular, entire  inferolateral wall. The left ventricular internal cavity size was normal in size. There is mild left ventricular hypertrophy. Left ventricular diastolic parameters are consistent with Grade II diastolic dysfunction (pseudonormalization). Elevated left atrial pressure. Right Ventricle: The right ventricular size is moderately enlarged. No increase in right ventricular wall thickness. Right ventricular systolic function is moderately reduced. There is moderately elevated pulmonary artery systolic pressure. The tricuspid  regurgitant velocity is 3.24 m/s, and with an assumed right atrial pressure of 15 mmHg, the estimated right ventricular systolic pressure is 95.0 mmHg. Left Atrium: Left atrial size was mildly dilated. Right Atrium: Right atrial size was normal in size. Pericardium: There is no evidence of pericardial effusion. Mitral Valve: The mitral valve is degenerative in appearance. There is moderate thickening of the mitral valve leaflet(s). There is mild calcification of the mitral valve leaflet(s). Mild mitral annular calcification. Severe mitral valve regurgitation. No evidence of mitral valve stenosis. Tricuspid Valve: The tricuspid valve is normal in structure. Tricuspid valve regurgitation is moderate. Aortic Valve: The aortic valve is tricuspid. There is mild calcification of the aortic valve. There is moderate thickening of the aortic valve. Aortic valve regurgitation is not visualized. Mild to moderate aortic valve sclerosis/calcification is present, without any evidence of aortic stenosis. Aortic valve mean gradient measures 3.5 mmHg.  Aortic valve peak gradient measures 5.2 mmHg. Aortic valve area, by VTI measures 2.39 cm. Pulmonic Valve: The pulmonic valve was not well visualized. Pulmonic valve regurgitation is mild. No evidence of pulmonic stenosis. Aorta: The aortic root and ascending aorta are structurally normal, with no evidence of dilitation. Pulmonary Artery: The pulmonary artery is not well seen. Venous: The inferior vena cava is dilated in size with less than 50% respiratory variability, suggesting right atrial pressure of 15 mmHg. IAS/Shunts: The atrial septum is grossly normal.  LEFT VENTRICLE PLAX 2D LVIDd:         4.88 cm      Diastology LVIDs:         4.33 cm      LV e' medial:    3.70 cm/s LV PW:         1.13 cm      LV E/e' medial:  34.9 LV IVS:        1.03 cm      LV e' lateral:   3.92 cm/s LVOT diam:     2.00 cm      LV E/e' lateral: 32.9 LV SV:         39 LV SV Index:   27 LVOT Area:     3.14 cm  LV Volumes (MOD) LV vol d, MOD A2C: 100.0 ml LV vol d, MOD A4C: 94.7 ml LV vol s, MOD A2C: 76.3 ml LV vol s, MOD A4C: 53.9 ml LV SV MOD A2C:     23.7 ml LV SV MOD A4C:     94.7 ml LV SV MOD BP:      34.1 ml RIGHT VENTRICLE RV Basal diam:  5.23 cm RV S prime:     10.70  cm/s TAPSE (M-mode): 1.2 cm LEFT ATRIUM             Index       RIGHT ATRIUM           Index LA diam:        4.50 cm 3.06 cm/m  RA Area:     15.40 cm LA Vol (A2C):   70.6 ml 48.07 ml/m RA Volume:   33.50 ml  22.81 ml/m LA Vol (A4C):   42.5 ml 28.94 ml/m LA Biplane Vol: 55.5 ml 37.79 ml/m  AORTIC VALVE                   PULMONIC VALVE AV Area (Vmax):    1.65 cm    PV Vmax:        0.66 m/s AV Area (Vmean):   1.46 cm    PV Peak grad:   1.8 mmHg AV Area (VTI):     2.39 cm    RVOT Peak grad: 3 mmHg AV Vmax:           114.50 cm/s AV Vmean:          84.400 cm/s AV VTI:            0.163 m AV Peak Grad:      5.2 mmHg AV Mean Grad:      3.5 mmHg LVOT Vmax:         60.10 cm/s LVOT Vmean:        39.200 cm/s LVOT VTI:          0.124 m LVOT/AV VTI ratio: 0.76  AORTA Ao  Root diam: 2.95 cm MITRAL VALVE                TRICUSPID VALVE MV Area (PHT): 7.29 cm     TR Peak grad:   42.0 mmHg MV Decel Time: 104 msec     TR Vmax:        324.00 cm/s MV E velocity: 129.00 cm/s MV A velocity: 84.00 cm/s   SHUNTS MV E/A ratio:  1.54         Systemic VTI:  0.12 m                             Systemic Diam: 2.00 cm Nelva Bush MD Electronically signed by Nelva Bush MD Signature Date/Time: 07/29/2020/10:41:52 AM    Final         Scheduled Meds: . aspirin EC  81 mg Oral Daily  . Chlorhexidine Gluconate Cloth  6 each Topical Daily  . furosemide  40 mg Intravenous BID  . hydroxychloroquine  200 mg Oral BID  . predniSONE  5 mg Oral Q breakfast  . simvastatin  40 mg Oral QPM   Continuous Infusions: . heparin 750 Units/hr (2020-08-24 0708)  . nitroGLYCERIN 5 mcg/min (08-24-20 0600)     LOS: 2 days    Time spent: 35 mins     Wyvonnia Dusky, MD [facility] Pager [phone]  If 7PM-7AM, please contact night-coverage 2020-08-24, 8:57 AM

## 2020-08-19 NOTE — Progress Notes (Signed)
[place] [facility] [place] for Heparin Infusion Indication: chest pain/ACS /STEMI   Allergies  Allergen Reactions  . Penicillins Other (See Comments)    Pass out Has patient had a PCN reaction causing immediate rash, facial/tongue/throat swelling, SOB or lightheadedness with hypotension: Yes Has patient had a PCN reaction causing severe rash involving mucus membranes or skin necrosis: No Has patient had a PCN reaction that required hospitalization: No Has patient had a PCN reaction occurring within the last 10 years: No If all of the above answers are "NO", then may proceed with Cephalosporin use.   . Ace Inhibitors Nausea Only  . Losartan Anxiety  . Spironolactone Other (See Comments)    Felt poorly but couldn't clarify   . Sulfa Antibiotics Nausea Only  . Sulfasalazine Nausea And Vomiting         Patient Measurements: Height: 4\' 11"  (149.9 cm) Weight: 55.2 kg (121 lb 11.1 oz) IBW/kg (Calculated) : 43.2 Heparin Dosing Weight: 53.1kg  Vital Signs: Temp: 97.4 F (36.3 C) (01/12 0400) Temp Source: Oral (01/12 0400) BP: 103/63 (01/12 0600) Pulse Rate: 98 (01/12 0632)  Labs: Recent Labs    07/27/2020 0317 07/26/2020 0500 07/29/20 0014 07/29/20 0735 Aug 05, 2020 0218  HGB 10.5*  --   --  9.8* 9.3*  HCT 34.2*  --   --  30.8* 29.4*  PLT 181  --   --  174 185  APTT  --  24  --   --   --   LABPROT  --  12.8  --   --   --   INR  --  1.0  --   --   --   HEPARINUNFRC  --   --  0.36 0.48 0.29*  CREATININE 1.45*  --   --  1.66* 1.63*  TROPONINIHS 9,518* 8,440*  --   --   --     Estimated Creatinine Clearance: 23.6 mL/min (A) (by C-G formula based on SCr of 1.63 mg/dL (H)).   Medical History: Past Medical History:  Diagnosis Date  . Anemia    VITAMIN B12 DEFICIENCY  . Arthritis   . Cancer [facility])    Stomach cancer  . Cardiomyopathy ([place])   . CHF (congestive heart failure) ([place])   . Chronic kidney disease    STAGE IV KIDNEY DISEASE  . COPD (chronic  obstructive pulmonary disease) ([place])   . DDD (degenerative disc disease), cervical   . GERD (gastroesophageal reflux disease)   . HTN (hypertension)   . Hyperlipidemia   . Lupus ([place])   . 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 Peripheral vascular disease ([place])   . Polyp of ascending colon 05/2020   several small polyps (precancerous) removed.  . Raynaud's disease   . S/P angioplasty with stent   . Wears dentures    full upper and lower   Pt is 75 yo female presenting to ED with acute respiratory distress.  Pt hx  medical history significant for SLE, diffuse large B-cell lymphoma followed by Dr. Mike Gip, HTN, CAD s/p CABG, COPD, diastolic heart failure, who is status post recent great toe surgery on 07/25/20.   Goal of Therapy:  Heparin level 0.3-0.7 units/ml Monitor platelets by anticoagulation protocol: Yes   Plan:  1/11 0735 HL 0.48, therapeutic x 2. 01/12 0218 HL 0..29, subtherapeutic  HL just below therapeutic.  Increasing heparin drip to 750 units/hr. Recheck HL in 8 hrs and CBC with morning labs. Follow Cardiology plan for heparin drip.  Renda Rolls, PharmD, MBA 2020/07/31 7:00 AM

## 2020-08-19 DEATH — deceased

## 2020-08-20 ENCOUNTER — Ambulatory Visit: Payer: Medicare HMO | Admitting: Cardiovascular Disease

## 2020-10-06 ENCOUNTER — Ambulatory Visit: Payer: Medicare HMO | Admitting: Hematology and Oncology

## 2020-10-06 ENCOUNTER — Other Ambulatory Visit: Payer: Medicare HMO

## 2021-01-30 ENCOUNTER — Ambulatory Visit: Payer: Medicare HMO | Admitting: Radiation Oncology
